# Patient Record
Sex: Female | Born: 1940 | Race: White | Hispanic: No | Smoking: Never smoker
Health system: Southern US, Community
[De-identification: ages and names within clinical notes are randomized; demographics above are authoritative.]

## PROBLEM LIST (undated history)

## (undated) DIAGNOSIS — N2581 Secondary hyperparathyroidism of renal origin: Secondary | ICD-10-CM

## (undated) DIAGNOSIS — R911 Solitary pulmonary nodule: Secondary | ICD-10-CM

## (undated) DIAGNOSIS — K5730 Diverticulosis of large intestine without perforation or abscess without bleeding: Secondary | ICD-10-CM

## (undated) DIAGNOSIS — I639 Cerebral infarction, unspecified: Secondary | ICD-10-CM

## (undated) DIAGNOSIS — Z87448 Personal history of other diseases of urinary system: Secondary | ICD-10-CM

## (undated) DIAGNOSIS — R002 Palpitations: Secondary | ICD-10-CM

## (undated) DIAGNOSIS — M792 Neuralgia and neuritis, unspecified: Secondary | ICD-10-CM

## (undated) DIAGNOSIS — Z90710 Acquired absence of both cervix and uterus: Secondary | ICD-10-CM

## (undated) DIAGNOSIS — Z950 Presence of cardiac pacemaker: Secondary | ICD-10-CM

## (undated) DIAGNOSIS — M810 Age-related osteoporosis without current pathological fracture: Secondary | ICD-10-CM

## (undated) DIAGNOSIS — M5481 Occipital neuralgia: Secondary | ICD-10-CM

## (undated) DIAGNOSIS — E7800 Pure hypercholesterolemia, unspecified: Secondary | ICD-10-CM

## (undated) DIAGNOSIS — N184 Chronic kidney disease, stage 4 (severe): Secondary | ICD-10-CM

## (undated) DIAGNOSIS — M1990 Unspecified osteoarthritis, unspecified site: Secondary | ICD-10-CM

## (undated) DIAGNOSIS — E039 Hypothyroidism, unspecified: Secondary | ICD-10-CM

## (undated) DIAGNOSIS — D649 Anemia, unspecified: Secondary | ICD-10-CM

## (undated) DIAGNOSIS — K635 Polyp of colon: Secondary | ICD-10-CM

## (undated) DIAGNOSIS — I10 Essential (primary) hypertension: Secondary | ICD-10-CM

## (undated) DIAGNOSIS — M47812 Spondylosis without myelopathy or radiculopathy, cervical region: Secondary | ICD-10-CM

## (undated) DIAGNOSIS — I452 Bifascicular block: Secondary | ICD-10-CM

## (undated) DIAGNOSIS — R0989 Other specified symptoms and signs involving the circulatory and respiratory systems: Secondary | ICD-10-CM

## (undated) HISTORY — DX: Personal history of other diseases of urinary system: Z87.448

## (undated) HISTORY — DX: Essential (primary) hypertension: I10

## (undated) HISTORY — DX: Spondylosis without myelopathy or radiculopathy, cervical region: M47.812

## (undated) HISTORY — DX: Pure hypercholesterolemia, unspecified: E78.00

## (undated) HISTORY — DX: Age-related osteoporosis without current pathological fracture: M81.0

## (undated) HISTORY — DX: Hypothyroidism, unspecified: E03.9

## (undated) HISTORY — DX: Acquired absence of both cervix and uterus: Z90.710

## (undated) HISTORY — DX: Solitary pulmonary nodule: R91.1

## (undated) HISTORY — DX: Cerebral infarction, unspecified: I63.9

## (undated) HISTORY — DX: Diverticulosis of large intestine without perforation or abscess without bleeding: K57.30

## (undated) HISTORY — PX: BACK SURGERY: SHX140

## (undated) HISTORY — DX: Chronic kidney disease, stage 4 (severe): N18.4

## (undated) HISTORY — PX: ABDOMINAL HYSTERECTOMY: SHX81

## (undated) HISTORY — DX: Unspecified osteoarthritis, unspecified site: M19.90

## (undated) HISTORY — DX: Anemia, unspecified: D64.9

## (undated) HISTORY — DX: Secondary hyperparathyroidism of renal origin: N25.81

## (undated) HISTORY — DX: Polyp of colon: K63.5

## (undated) HISTORY — DX: Neuralgia and neuritis, unspecified: M79.2

## (undated) HISTORY — DX: Palpitations: R00.2

## (undated) HISTORY — DX: Occipital neuralgia: M54.81

## (undated) HISTORY — DX: Other specified symptoms and signs involving the circulatory and respiratory systems: R09.89

---

## 2000-02-11 ENCOUNTER — Ambulatory Visit (HOSPITAL_COMMUNITY): Admission: RE | Admit: 2000-02-11 | Discharge: 2000-02-11 | Payer: Self-pay | Admitting: Cardiology

## 2000-08-10 ENCOUNTER — Other Ambulatory Visit: Admission: RE | Admit: 2000-08-10 | Discharge: 2000-08-10 | Payer: Self-pay | Admitting: Gynecology

## 2001-10-06 ENCOUNTER — Encounter: Payer: Self-pay | Admitting: Endocrinology

## 2001-10-06 ENCOUNTER — Encounter: Admission: RE | Admit: 2001-10-06 | Discharge: 2001-10-06 | Payer: Self-pay | Admitting: Endocrinology

## 2001-11-14 ENCOUNTER — Encounter: Payer: Self-pay | Admitting: Neurology

## 2001-11-14 ENCOUNTER — Ambulatory Visit (HOSPITAL_COMMUNITY): Admission: RE | Admit: 2001-11-14 | Discharge: 2001-11-14 | Payer: Self-pay | Admitting: Neurology

## 2001-11-29 ENCOUNTER — Encounter: Payer: Self-pay | Admitting: Neurology

## 2001-11-29 ENCOUNTER — Encounter: Admission: RE | Admit: 2001-11-29 | Discharge: 2001-11-29 | Payer: Self-pay | Admitting: Neurology

## 2001-12-07 ENCOUNTER — Ambulatory Visit (HOSPITAL_COMMUNITY): Admission: RE | Admit: 2001-12-07 | Discharge: 2001-12-07 | Payer: Self-pay | Admitting: Neurosurgery

## 2001-12-07 ENCOUNTER — Encounter: Payer: Self-pay | Admitting: Neurosurgery

## 2001-12-30 ENCOUNTER — Encounter: Payer: Self-pay | Admitting: Neurosurgery

## 2002-01-04 ENCOUNTER — Ambulatory Visit (HOSPITAL_COMMUNITY): Admission: RE | Admit: 2002-01-04 | Discharge: 2002-01-05 | Payer: Self-pay | Admitting: Neurosurgery

## 2002-01-04 ENCOUNTER — Encounter: Payer: Self-pay | Admitting: Neurosurgery

## 2002-08-16 ENCOUNTER — Ambulatory Visit (HOSPITAL_COMMUNITY): Admission: RE | Admit: 2002-08-16 | Discharge: 2002-08-16 | Payer: Self-pay | Admitting: Neurosurgery

## 2002-08-16 ENCOUNTER — Encounter: Payer: Self-pay | Admitting: Neurosurgery

## 2002-11-14 ENCOUNTER — Encounter: Admission: RE | Admit: 2002-11-14 | Discharge: 2002-11-14 | Payer: Self-pay | Admitting: Neurosurgery

## 2002-11-14 ENCOUNTER — Encounter: Payer: Self-pay | Admitting: Diagnostic Radiology

## 2002-11-14 ENCOUNTER — Encounter: Payer: Self-pay | Admitting: Neurosurgery

## 2002-11-28 ENCOUNTER — Encounter: Payer: Self-pay | Admitting: Neurosurgery

## 2002-11-28 ENCOUNTER — Encounter: Admission: RE | Admit: 2002-11-28 | Discharge: 2002-11-28 | Payer: Self-pay | Admitting: Neurosurgery

## 2003-02-16 ENCOUNTER — Encounter
Admission: RE | Admit: 2003-02-16 | Discharge: 2003-05-17 | Payer: Self-pay | Admitting: Physical Medicine & Rehabilitation

## 2003-02-23 ENCOUNTER — Ambulatory Visit (HOSPITAL_COMMUNITY)
Admission: RE | Admit: 2003-02-23 | Discharge: 2003-02-23 | Payer: Self-pay | Admitting: Physical Medicine & Rehabilitation

## 2003-02-23 ENCOUNTER — Encounter: Payer: Self-pay | Admitting: Physical Medicine & Rehabilitation

## 2003-08-02 ENCOUNTER — Ambulatory Visit (HOSPITAL_COMMUNITY): Admission: RE | Admit: 2003-08-02 | Discharge: 2003-08-02 | Payer: Self-pay | Admitting: Neurosurgery

## 2003-09-18 ENCOUNTER — Other Ambulatory Visit: Admission: RE | Admit: 2003-09-18 | Discharge: 2003-09-18 | Payer: Self-pay | Admitting: Gynecology

## 2004-01-20 ENCOUNTER — Emergency Department (HOSPITAL_COMMUNITY): Admission: EM | Admit: 2004-01-20 | Discharge: 2004-01-20 | Payer: Self-pay | Admitting: Emergency Medicine

## 2004-02-13 ENCOUNTER — Ambulatory Visit (HOSPITAL_COMMUNITY): Admission: RE | Admit: 2004-02-13 | Discharge: 2004-02-13 | Payer: Self-pay | Admitting: Neurology

## 2004-03-01 ENCOUNTER — Ambulatory Visit (HOSPITAL_COMMUNITY): Admission: RE | Admit: 2004-03-01 | Discharge: 2004-03-01 | Payer: Self-pay | Admitting: Neurology

## 2004-03-04 ENCOUNTER — Ambulatory Visit (HOSPITAL_COMMUNITY): Admission: RE | Admit: 2004-03-04 | Discharge: 2004-03-04 | Payer: Self-pay | Admitting: Cardiology

## 2004-03-04 ENCOUNTER — Encounter: Payer: Self-pay | Admitting: Internal Medicine

## 2004-06-10 ENCOUNTER — Ambulatory Visit: Payer: Self-pay | Admitting: Endocrinology

## 2004-07-08 ENCOUNTER — Ambulatory Visit: Payer: Self-pay | Admitting: Endocrinology

## 2004-09-09 ENCOUNTER — Emergency Department (HOSPITAL_COMMUNITY): Admission: EM | Admit: 2004-09-09 | Discharge: 2004-09-09 | Payer: Self-pay | Admitting: Emergency Medicine

## 2004-09-09 ENCOUNTER — Ambulatory Visit: Payer: Self-pay | Admitting: Gastroenterology

## 2004-10-02 ENCOUNTER — Ambulatory Visit: Payer: Self-pay | Admitting: Endocrinology

## 2004-10-04 ENCOUNTER — Ambulatory Visit: Payer: Self-pay | Admitting: Cardiology

## 2004-10-09 ENCOUNTER — Ambulatory Visit: Payer: Self-pay | Admitting: Cardiology

## 2004-10-09 ENCOUNTER — Ambulatory Visit: Payer: Self-pay | Admitting: Endocrinology

## 2004-10-09 ENCOUNTER — Ambulatory Visit: Payer: Self-pay

## 2004-10-18 ENCOUNTER — Ambulatory Visit: Payer: Self-pay | Admitting: Cardiology

## 2004-11-07 ENCOUNTER — Ambulatory Visit: Payer: Self-pay | Admitting: Internal Medicine

## 2004-12-08 ENCOUNTER — Inpatient Hospital Stay (HOSPITAL_COMMUNITY): Admission: EM | Admit: 2004-12-08 | Discharge: 2004-12-10 | Payer: Self-pay | Admitting: Emergency Medicine

## 2004-12-09 ENCOUNTER — Ambulatory Visit: Payer: Self-pay | Admitting: Cardiology

## 2004-12-09 ENCOUNTER — Encounter: Payer: Self-pay | Admitting: Cardiology

## 2004-12-10 ENCOUNTER — Ambulatory Visit: Payer: Self-pay | Admitting: Endocrinology

## 2005-01-15 ENCOUNTER — Ambulatory Visit: Payer: Self-pay | Admitting: Cardiology

## 2005-02-05 ENCOUNTER — Ambulatory Visit: Payer: Self-pay | Admitting: Endocrinology

## 2005-04-03 ENCOUNTER — Ambulatory Visit: Payer: Self-pay | Admitting: Cardiology

## 2005-05-15 ENCOUNTER — Ambulatory Visit: Payer: Self-pay | Admitting: Cardiology

## 2005-05-15 ENCOUNTER — Inpatient Hospital Stay (HOSPITAL_COMMUNITY): Admission: AD | Admit: 2005-05-15 | Discharge: 2005-05-16 | Payer: Self-pay | Admitting: Cardiology

## 2005-05-29 ENCOUNTER — Ambulatory Visit: Payer: Self-pay | Admitting: Cardiology

## 2005-06-11 ENCOUNTER — Encounter: Admission: RE | Admit: 2005-06-11 | Discharge: 2005-06-11 | Payer: Self-pay | Admitting: Neurology

## 2005-06-20 ENCOUNTER — Ambulatory Visit: Payer: Self-pay | Admitting: Internal Medicine

## 2005-06-25 ENCOUNTER — Ambulatory Visit: Payer: Self-pay | Admitting: Cardiology

## 2005-08-13 ENCOUNTER — Ambulatory Visit: Payer: Self-pay | Admitting: Cardiology

## 2005-08-20 ENCOUNTER — Ambulatory Visit: Payer: Self-pay | Admitting: Gastroenterology

## 2005-09-17 ENCOUNTER — Ambulatory Visit: Payer: Self-pay | Admitting: Cardiology

## 2005-09-25 ENCOUNTER — Ambulatory Visit: Payer: Self-pay | Admitting: Gastroenterology

## 2005-10-03 ENCOUNTER — Ambulatory Visit: Payer: Self-pay | Admitting: Gastroenterology

## 2005-10-09 ENCOUNTER — Ambulatory Visit: Payer: Self-pay | Admitting: Endocrinology

## 2005-10-16 ENCOUNTER — Ambulatory Visit: Payer: Self-pay | Admitting: Endocrinology

## 2006-03-23 ENCOUNTER — Emergency Department (HOSPITAL_COMMUNITY): Admission: EM | Admit: 2006-03-23 | Discharge: 2006-03-24 | Payer: Self-pay | Admitting: Emergency Medicine

## 2006-04-13 ENCOUNTER — Ambulatory Visit: Payer: Self-pay | Admitting: Cardiology

## 2006-06-05 ENCOUNTER — Ambulatory Visit: Payer: Self-pay | Admitting: Internal Medicine

## 2006-10-09 ENCOUNTER — Ambulatory Visit: Payer: Self-pay | Admitting: Endocrinology

## 2006-10-09 LAB — CONVERTED CEMR LAB
ALT: 34 units/L (ref 0–40)
Albumin: 3.4 g/dL — ABNORMAL LOW (ref 3.5–5.2)
Alkaline Phosphatase: 68 units/L (ref 39–117)
BUN: 21 mg/dL (ref 6–23)
Bacteria, UA: NEGATIVE
Basophils Relative: 1 % (ref 0.0–1.0)
CO2: 30 meq/L (ref 19–32)
Calcium: 9.1 mg/dL (ref 8.4–10.5)
Creatinine, Ser: 1.1 mg/dL (ref 0.4–1.2)
Crystals: NEGATIVE
Eosinophils Relative: 6.4 % — ABNORMAL HIGH (ref 0.0–5.0)
GFR calc Af Amer: 64 mL/min
HDL: 93 mg/dL (ref >39.0)
Lymphocytes Relative: 29.4 % (ref 12.0–46.0)
Mucus, UA: NEGATIVE
Platelets: 211 10*3/uL (ref 150–400)
RBC / HPF: NONE SEEN
RBC: 4.14 M/uL (ref 3.87–5.11)
RDW: 13.4 % (ref 11.5–14.6)
Specific Gravity, Urine: 1.025 (ref 1.000–1.03)
TSH: 4.75 microintl units/mL (ref 0.35–5.50)
Total Bilirubin: 0.7 mg/dL (ref 0.3–1.2)
Total CHOL/HDL Ratio: 2.5
Total Protein: 6.2 g/dL (ref 6.0–8.3)
Triglycerides: 48 mg/dL (ref 0–149)
VLDL: 10 mg/dL (ref 0–40)
WBC: 5 10*3/uL (ref 4.5–10.5)

## 2006-10-19 ENCOUNTER — Ambulatory Visit: Payer: Self-pay | Admitting: Endocrinology

## 2006-10-22 ENCOUNTER — Ambulatory Visit: Payer: Self-pay | Admitting: Cardiology

## 2007-02-06 ENCOUNTER — Encounter: Payer: Self-pay | Admitting: *Deleted

## 2007-02-06 DIAGNOSIS — E039 Hypothyroidism, unspecified: Secondary | ICD-10-CM | POA: Insufficient documentation

## 2007-02-06 DIAGNOSIS — M479 Spondylosis, unspecified: Secondary | ICD-10-CM | POA: Insufficient documentation

## 2007-02-06 DIAGNOSIS — Z87448 Personal history of other diseases of urinary system: Secondary | ICD-10-CM | POA: Insufficient documentation

## 2007-02-06 DIAGNOSIS — K219 Gastro-esophageal reflux disease without esophagitis: Secondary | ICD-10-CM | POA: Insufficient documentation

## 2007-02-06 DIAGNOSIS — Z860100 Personal history of colon polyps, unspecified: Secondary | ICD-10-CM | POA: Insufficient documentation

## 2007-02-06 DIAGNOSIS — I10 Essential (primary) hypertension: Secondary | ICD-10-CM | POA: Insufficient documentation

## 2007-02-06 DIAGNOSIS — M810 Age-related osteoporosis without current pathological fracture: Secondary | ICD-10-CM | POA: Insufficient documentation

## 2007-02-06 DIAGNOSIS — R002 Palpitations: Secondary | ICD-10-CM | POA: Insufficient documentation

## 2007-02-06 DIAGNOSIS — K5730 Diverticulosis of large intestine without perforation or abscess without bleeding: Secondary | ICD-10-CM | POA: Insufficient documentation

## 2007-04-12 ENCOUNTER — Ambulatory Visit: Payer: Self-pay | Admitting: Cardiology

## 2007-04-26 ENCOUNTER — Telehealth (INDEPENDENT_AMBULATORY_CARE_PROVIDER_SITE_OTHER): Payer: Self-pay | Admitting: *Deleted

## 2007-04-27 ENCOUNTER — Ambulatory Visit: Payer: Self-pay | Admitting: Endocrinology

## 2007-04-27 DIAGNOSIS — J984 Other disorders of lung: Secondary | ICD-10-CM | POA: Insufficient documentation

## 2007-05-17 ENCOUNTER — Ambulatory Visit: Payer: Self-pay | Admitting: Cardiology

## 2007-10-25 ENCOUNTER — Ambulatory Visit: Payer: Self-pay | Admitting: Endocrinology

## 2007-10-26 LAB — CONVERTED CEMR LAB
ALT: 25 units/L (ref 0–35)
AST: 32 units/L (ref 0–37)
Basophils Absolute: 0 10*3/uL (ref 0.0–0.1)
Bilirubin, Direct: 0.1 mg/dL (ref 0.0–0.3)
CO2: 29 meq/L (ref 19–32)
Chloride: 107 meq/L (ref 96–112)
Cholesterol: 250 mg/dL (ref 0–200)
Creatinine, Ser: 1 mg/dL (ref 0.4–1.2)
Direct LDL: 142.1 mg/dL
GFR calc non Af Amer: 59 mL/min
HDL: 90.6 mg/dL (ref >39.0)
Hemoglobin: 13.2 g/dL (ref 12.0–15.0)
Lymphocytes Relative: 25.8 % (ref 12.0–46.0)
MCHC: 33.8 g/dL (ref 30.0–36.0)
Monocytes Relative: 9.3 % (ref 3.0–12.0)
Mucus, UA: NEGATIVE
Neutro Abs: 3.8 10*3/uL (ref 1.4–7.7)
Neutrophils Relative %: 59.6 % (ref 43.0–77.0)
Nitrite: NEGATIVE
Platelets: 231 10*3/uL (ref 150–400)
RDW: 13 % (ref 11.5–14.6)
Sodium: 142 meq/L (ref 135–145)
Specific Gravity, Urine: 1.02 (ref 1.000–1.03)
TSH: 2.66 microintl units/mL (ref 0.35–5.50)
Total Bilirubin: 0.9 mg/dL (ref 0.3–1.2)
Total CHOL/HDL Ratio: 2.8
Total Protein, Urine: NEGATIVE mg/dL
VLDL: 13 mg/dL (ref 0–40)
pH: 6 (ref 5.0–8.0)

## 2007-10-27 ENCOUNTER — Ambulatory Visit: Payer: Self-pay | Admitting: Internal Medicine

## 2007-11-03 ENCOUNTER — Telehealth: Payer: Self-pay | Admitting: Endocrinology

## 2007-11-08 ENCOUNTER — Ambulatory Visit: Payer: Self-pay | Admitting: Cardiology

## 2007-11-18 ENCOUNTER — Ambulatory Visit: Payer: Self-pay | Admitting: Cardiology

## 2007-11-24 ENCOUNTER — Encounter: Admission: RE | Admit: 2007-11-24 | Discharge: 2007-11-24 | Payer: Self-pay | Admitting: Neurology

## 2008-05-08 ENCOUNTER — Telehealth (INDEPENDENT_AMBULATORY_CARE_PROVIDER_SITE_OTHER): Payer: Self-pay | Admitting: *Deleted

## 2008-05-29 ENCOUNTER — Ambulatory Visit: Payer: Self-pay | Admitting: Endocrinology

## 2008-06-02 ENCOUNTER — Telehealth: Payer: Self-pay | Admitting: Endocrinology

## 2008-06-26 ENCOUNTER — Ambulatory Visit: Payer: Self-pay | Admitting: Cardiology

## 2008-07-07 ENCOUNTER — Telehealth (INDEPENDENT_AMBULATORY_CARE_PROVIDER_SITE_OTHER): Payer: Self-pay | Admitting: *Deleted

## 2008-07-24 ENCOUNTER — Telehealth (INDEPENDENT_AMBULATORY_CARE_PROVIDER_SITE_OTHER): Payer: Self-pay | Admitting: *Deleted

## 2008-08-17 ENCOUNTER — Ambulatory Visit: Payer: Self-pay | Admitting: Cardiology

## 2008-08-17 ENCOUNTER — Encounter: Payer: Self-pay | Admitting: Cardiology

## 2008-08-17 DIAGNOSIS — Z8679 Personal history of other diseases of the circulatory system: Secondary | ICD-10-CM | POA: Insufficient documentation

## 2008-08-17 DIAGNOSIS — I6350 Cerebral infarction due to unspecified occlusion or stenosis of unspecified cerebral artery: Secondary | ICD-10-CM | POA: Insufficient documentation

## 2008-08-17 DIAGNOSIS — E7800 Pure hypercholesterolemia, unspecified: Secondary | ICD-10-CM | POA: Insufficient documentation

## 2008-09-25 ENCOUNTER — Ambulatory Visit: Payer: Self-pay | Admitting: Gastroenterology

## 2008-09-26 ENCOUNTER — Telehealth: Payer: Self-pay | Admitting: Cardiology

## 2008-10-23 ENCOUNTER — Ambulatory Visit: Payer: Self-pay | Admitting: Gastroenterology

## 2008-10-24 ENCOUNTER — Ambulatory Visit: Payer: Self-pay | Admitting: Endocrinology

## 2008-10-26 ENCOUNTER — Encounter: Payer: Self-pay | Admitting: Endocrinology

## 2008-12-04 ENCOUNTER — Ambulatory Visit: Payer: Self-pay | Admitting: Endocrinology

## 2008-12-04 DIAGNOSIS — K224 Dyskinesia of esophagus: Secondary | ICD-10-CM | POA: Insufficient documentation

## 2008-12-07 LAB — CONVERTED CEMR LAB: PTH: 59.9 pg/mL (ref 14.0–72.0)

## 2008-12-25 ENCOUNTER — Ambulatory Visit: Payer: Self-pay | Admitting: Endocrinology

## 2008-12-26 LAB — CONVERTED CEMR LAB
AST: 33 units/L (ref 0–37)
Albumin: 3.7 g/dL (ref 3.5–5.2)
Alkaline Phosphatase: 86 units/L (ref 39–117)
BUN: 27 mg/dL — ABNORMAL HIGH (ref 6–23)
Basophils Absolute: 0 10*3/uL (ref 0.0–0.1)
Bilirubin, Direct: 0 mg/dL (ref 0.0–0.3)
Chloride: 104 meq/L (ref 96–112)
Cholesterol: 248 mg/dL — ABNORMAL HIGH (ref 0–200)
Direct LDL: 138.7 mg/dL
Eosinophils Absolute: 0.4 10*3/uL (ref 0.0–0.7)
GFR calc non Af Amer: 36.78 mL/min (ref >60)
HDL: 88.9 mg/dL (ref >39.00)
Hemoglobin: 13.6 g/dL (ref 12.0–15.0)
Ketones, ur: NEGATIVE mg/dL
Lymphocytes Relative: 23.8 % (ref 12.0–46.0)
MCHC: 34 g/dL (ref 30.0–36.0)
Monocytes Relative: 10.2 % (ref 3.0–12.0)
Neutro Abs: 3.7 10*3/uL (ref 1.4–7.7)
Neutrophils Relative %: 58.9 % (ref 43.0–77.0)
Platelets: 230 10*3/uL (ref 150.0–400.0)
Potassium: 3.9 meq/L (ref 3.5–5.1)
RDW: 12.8 % (ref 11.5–14.6)
Sodium: 141 meq/L (ref 135–145)
Specific Gravity, Urine: >=1.03 (ref 1.000–1.030)
Total Bilirubin: 0.8 mg/dL (ref 0.3–1.2)
Total CHOL/HDL Ratio: 3
Total Protein, Urine: NEGATIVE mg/dL
Urine Glucose: NEGATIVE mg/dL
Urobilinogen, UA: 0.2 (ref 0.0–1.0)
VLDL: 8.8 mg/dL (ref 0.0–40.0)
pH: 5.5 (ref 5.0–8.0)

## 2009-01-01 ENCOUNTER — Ambulatory Visit: Payer: Self-pay | Admitting: Cardiology

## 2009-02-19 ENCOUNTER — Ambulatory Visit: Payer: Self-pay | Admitting: Cardiovascular Disease

## 2009-02-19 ENCOUNTER — Ambulatory Visit: Payer: Self-pay | Admitting: Endocrinology

## 2009-02-19 ENCOUNTER — Ambulatory Visit: Payer: Self-pay | Admitting: Internal Medicine

## 2009-02-19 DIAGNOSIS — L03211 Cellulitis of face: Secondary | ICD-10-CM | POA: Insufficient documentation

## 2009-02-19 DIAGNOSIS — L0201 Cutaneous abscess of face: Secondary | ICD-10-CM | POA: Insufficient documentation

## 2009-02-21 ENCOUNTER — Telehealth: Payer: Self-pay | Admitting: Endocrinology

## 2009-02-23 ENCOUNTER — Telehealth: Payer: Self-pay | Admitting: Cardiology

## 2009-03-05 ENCOUNTER — Telehealth: Payer: Self-pay | Admitting: Endocrinology

## 2009-03-05 ENCOUNTER — Telehealth: Payer: Self-pay | Admitting: Cardiology

## 2009-03-16 ENCOUNTER — Telehealth: Payer: Self-pay | Admitting: Endocrinology

## 2009-07-09 ENCOUNTER — Encounter: Payer: Self-pay | Admitting: Cardiology

## 2009-07-09 ENCOUNTER — Ambulatory Visit: Payer: Self-pay | Admitting: Cardiology

## 2009-07-09 ENCOUNTER — Telehealth: Payer: Self-pay | Admitting: Cardiology

## 2009-07-09 ENCOUNTER — Observation Stay (HOSPITAL_COMMUNITY): Admission: EM | Admit: 2009-07-09 | Discharge: 2009-07-10 | Payer: Self-pay | Admitting: Emergency Medicine

## 2009-07-16 ENCOUNTER — Encounter: Payer: Self-pay | Admitting: Cardiology

## 2009-07-16 ENCOUNTER — Telehealth (INDEPENDENT_AMBULATORY_CARE_PROVIDER_SITE_OTHER): Payer: Self-pay | Admitting: *Deleted

## 2009-07-17 ENCOUNTER — Encounter: Payer: Self-pay | Admitting: Cardiology

## 2009-07-17 ENCOUNTER — Ambulatory Visit: Payer: Self-pay

## 2009-07-17 ENCOUNTER — Encounter (HOSPITAL_COMMUNITY): Admission: RE | Admit: 2009-07-17 | Discharge: 2009-09-05 | Payer: Self-pay | Admitting: Cardiology

## 2009-07-17 ENCOUNTER — Ambulatory Visit: Payer: Self-pay | Admitting: Cardiology

## 2009-07-26 ENCOUNTER — Ambulatory Visit: Payer: Self-pay | Admitting: Cardiology

## 2009-07-26 DIAGNOSIS — R079 Chest pain, unspecified: Secondary | ICD-10-CM | POA: Insufficient documentation

## 2009-09-24 ENCOUNTER — Telehealth: Payer: Self-pay | Admitting: Cardiology

## 2009-10-03 LAB — CONVERTED CEMR LAB: Pap Smear: NORMAL

## 2009-10-29 ENCOUNTER — Encounter: Payer: Self-pay | Admitting: Endocrinology

## 2009-10-31 ENCOUNTER — Telehealth: Payer: Self-pay | Admitting: Endocrinology

## 2009-12-04 ENCOUNTER — Ambulatory Visit: Payer: Self-pay | Admitting: Endocrinology

## 2009-12-04 LAB — CONVERTED CEMR LAB
ALT: 26 units/L (ref 0–35)
AST: 32 units/L (ref 0–37)
Albumin: 4.1 g/dL (ref 3.5–5.2)
BUN: 22 mg/dL (ref 6–23)
Bilirubin Urine: NEGATIVE
Calcium, Total (PTH): 9.7 mg/dL (ref 8.4–10.5)
Chloride: 108 meq/L (ref 96–112)
Eosinophils Relative: 9.6 % — ABNORMAL HIGH (ref 0.0–5.0)
GFR calc non Af Amer: 42.5 mL/min (ref >60)
Glucose, Bld: 85 mg/dL (ref 70–99)
HCT: 39.9 % (ref 36.0–46.0)
HDL: 96.2 mg/dL (ref >39.00)
Hemoglobin, Urine: NEGATIVE
Hemoglobin: 13.8 g/dL (ref 12.0–15.0)
Ketones, ur: NEGATIVE mg/dL
Lymphs Abs: 1.8 10*3/uL (ref 0.7–4.0)
MCV: 90.3 fL (ref 78.0–100.0)
Monocytes Absolute: 0.5 10*3/uL (ref 0.1–1.0)
Monocytes Relative: 8.6 % (ref 3.0–12.0)
Neutro Abs: 3.3 10*3/uL (ref 1.4–7.7)
Potassium: 4.7 meq/L (ref 3.5–5.1)
Sodium: 144 meq/L (ref 135–145)
TSH: 4.47 microintl units/mL (ref 0.35–5.50)
Total Protein: 7.3 g/dL (ref 6.0–8.3)
Urobilinogen, UA: 0.2 (ref 0.0–1.0)
WBC: 6.2 10*3/uL (ref 4.5–10.5)

## 2010-01-14 ENCOUNTER — Encounter: Payer: Self-pay | Admitting: Cardiology

## 2010-03-06 ENCOUNTER — Telehealth: Payer: Self-pay | Admitting: Endocrinology

## 2010-03-18 ENCOUNTER — Ambulatory Visit: Payer: Self-pay | Admitting: Endocrinology

## 2010-03-18 DIAGNOSIS — R059 Cough, unspecified: Secondary | ICD-10-CM | POA: Insufficient documentation

## 2010-03-18 DIAGNOSIS — R05 Cough: Secondary | ICD-10-CM

## 2010-03-22 ENCOUNTER — Encounter: Payer: Self-pay | Admitting: Endocrinology

## 2010-03-30 ENCOUNTER — Encounter: Payer: Self-pay | Admitting: Endocrinology

## 2010-04-04 ENCOUNTER — Encounter
Admission: RE | Admit: 2010-04-04 | Discharge: 2010-04-04 | Payer: Self-pay | Source: Home / Self Care | Admitting: Neurology

## 2010-05-16 ENCOUNTER — Other Ambulatory Visit: Payer: Self-pay | Admitting: Endocrinology

## 2010-05-16 ENCOUNTER — Ambulatory Visit
Admission: RE | Admit: 2010-05-16 | Discharge: 2010-05-16 | Payer: Self-pay | Source: Home / Self Care | Attending: Endocrinology | Admitting: Endocrinology

## 2010-05-16 DIAGNOSIS — R358 Other polyuria: Secondary | ICD-10-CM | POA: Insufficient documentation

## 2010-05-16 DIAGNOSIS — R1310 Dysphagia, unspecified: Secondary | ICD-10-CM | POA: Insufficient documentation

## 2010-05-16 DIAGNOSIS — R3589 Other polyuria: Secondary | ICD-10-CM | POA: Insufficient documentation

## 2010-05-16 LAB — BASIC METABOLIC PANEL WITH GFR
BUN: 31 mg/dL — ABNORMAL HIGH (ref 6–23)
CO2: 25 meq/L (ref 19–32)
Calcium: 8.5 mg/dL (ref 8.4–10.5)
Chloride: 107 meq/L (ref 96–112)
Creatinine, Ser: 1.4 mg/dL — ABNORMAL HIGH (ref 0.4–1.2)
GFR: 39.99 mL/min — ABNORMAL LOW (ref >60.00)
Glucose, Bld: 85 mg/dL (ref 70–99)
Potassium: 4.2 meq/L (ref 3.5–5.1)
Sodium: 140 meq/L (ref 135–145)

## 2010-05-16 LAB — TSH: TSH: 5.56 u[IU]/mL — ABNORMAL HIGH (ref 0.35–5.50)

## 2010-05-26 ENCOUNTER — Encounter: Payer: Self-pay | Admitting: Endocrinology

## 2010-05-30 ENCOUNTER — Ambulatory Visit (HOSPITAL_COMMUNITY)
Admission: RE | Admit: 2010-05-30 | Discharge: 2010-05-30 | Payer: Self-pay | Source: Home / Self Care | Attending: Endocrinology | Admitting: Endocrinology

## 2010-06-04 NOTE — Progress Notes (Signed)
Summary: Nuclear Pre-Procedure  Phone Note Outgoing Call   Call placed by: Perrin Maltese, EMT-P,  July 16, 2009 1:04 PM Summary of Call: Left message with information on Myoview Information Sheet (see scanned document for details).      Nuclear Med Background Indications for Stress Test: Evaluation for Ischemia, Post Hospital  Indications Comments: 07/10/09 CP (-) enzymes recent URI  History: COPD, Echo, Myocardial Perfusion Study  History Comments: '06 ECHO 60% '07 MPS EF 65% (-) scar or ischemia COPD per CXR  Symptoms: Chest Pain, Chest Pain with Exertion, DOE    Nuclear Pre-Procedure Cardiac Risk Factors: CVA, Family History - CAD, Hypertension, Lipids, TIA Height (in): 63.5  Nuclear Med Study Referring MD:  T.Stuckey

## 2010-06-04 NOTE — Assessment & Plan Note (Signed)
Summary: YEARLY FU/ MEDICARE/ LABS AFTER/NWS   Vital Signs:  Patient profile:   70 year old female Height:      63.5 inches (161.29 cm) Weight:      133.31 pounds (60.60 kg) BMI:     23.33 O2 Sat:      99 % on Room air Temp:     97.8 degrees F (36.56 degrees C) oral Pulse rate:   61 / minute BP sitting:   132 / 86  (left arm) Cuff size:   large  Vitals Entered By: Rebeca Alert MA (December 04, 2009 8:21 AM)  O2 Flow:  Room air CC: Yearly F/U-medicare/aj Comments Pt had Zostavax in 2009  Vision Screening:Left eye with correction: 20 / 20 Right eye with correction: 20 / 20 Both eyes with correction: 20 / 15        Vision Entered By: Rebeca Alert MA (December 04, 2009 8:22 AM)   Primary Provider:  Renato Shin, MD  CC:  Yearly F/U-medicare/aj.  History of Present Illness: here for regular wellness examination.  she's feeling pretty well in general, and does not drink or smoke.   Current Medications (verified): 1)  Metoprolol Succinate 50 Mg Xr24h-Tab (Metoprolol Succinate) .... Take One Tablet By Mouth Daily 2)  Adult Aspirin Low Strength 81 Mg  Tbdp (Aspirin) .... Take 1 By Mouth Once Daily 3)  Plavix 75 Mg  Tabs (Clopidogrel Bisulfate) .... Take 1 By Mouth Qd 4)  Fish Oil   Oil (Fish Oil) .... 1200mg  2 Tabs Once Daily 5)  Neurontin 300 Mg  Caps (Gabapentin) .... Take 1 By Mouth Two Times A Day Qd 6)  Synthroid 75 Mcg Tabs (Levothyroxine Sodium) .... Take 1 Tablet By Mouth Once A Day 7)  Calcitriol 0.25 Mcg Caps (Calcitriol) .... Qd 8)  Multivitamins  Tabs (Multiple Vitamin) .... Take 1 Tablet By Mouth Once A Day 9)  Vitamin D 1000 Unit  Tabs (Cholecalciferol) .... Take 1 Tablet By Mouth Once A Day 10)  Amlodipine Besylate 10 Mg Tabs (Amlodipine Besylate) .... Take One Tablet By Mouth Daily  Allergies (verified): 1)  ! Codeine 2)  ! Septra 3)  ! Mevacor 4)  ! Iodine 5)  ! Sulfa 6)  ! * Triamterene/hctz 7)  ! * Ivp Dye 8)  ! Flagyl  Family History: Reviewed  history from 09/25/2008 and no changes required. Father had kidney cancer and MI Family History of Colon Cancer: Mother Family History of Ovarian Cancer: Mother Family History of Heart Disease: Mother, Father  Social History: Reviewed history from 09/25/2008 and no changes required. 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  Alcohol Use - no Daily Caffeine Use 1 cup tea daily Illicit Drug Use - no  Review of Systems  The patient denies fever, weight gain, vision loss, decreased hearing, syncope, dyspnea on exertion, prolonged cough, headaches, abdominal pain, melena, hematochezia, severe indigestion/heartburn, hematuria, suspicious skin lesions, and depression.    Physical Exam  General:  normal appearance.   Head:  head: no deformity eyes: no periorbital swelling, no proptosis external nose and ears are normal mouth: no lesion seen Neck:  Supple without thyroid enlargement or tenderness.  Breasts:  sees gyn  Lungs:  Clear to auscultation bilaterally. Normal respiratory effort.  Heart:  Regular rate and rhythm without murmurs or gallops noted. Normal S1,S2.   Abdomen:  abdomen is soft, nontender.  no hepatosplenomegaly.   not distended.  no hernia  Rectal:  sees gyn  Genitalia:  sees gyn  Msk:  muscle bulk and strength are grossly norma, except the left foot is slightly decreasedl.  no obvious joint swelling.  gait is normal and steady  Neurologic:  cn 2-12 grossly intact.   readily moves all 4's.   sensation is intact to touch on the feet  Skin:  normal texture and temp.  no rash.  not diaphoretic  Cervical Nodes:  No significant adenopathy.  Psych:  Alert and cooperative; normal mood and affect; normal attention span and concentration.   Additional Exam:  SEPARATE EVALUATION FOLLOWS--EACH PROBLEM HERE IS NEW, NOT RESPONDING TO TREATMENT, OR POSES SIGNIFICANT RISK TO THE PATIENT'S HEALTH: HISTORY OF THE PRESENT ILLNESS: pt states 15 years of  intermittent moderate pain at the left 5th toe.  no assoc rash she says (solid=liquid) dysphagia has recurred.  she had it 15 years ago. elev ldl is noted.  he did not tolerate mevacor PAST MEDICAL HISTORY reviewed and up to date today REVIEW OF SYSTEMS: denies weight loss and chest pain PHYSICAL EXAMINATION: abdomen is soft, nontender.  no hepatosplenomegaly.   not distended.  no hernia dorsalis pedis intact bilat.  no carotid bruit no deformity.  no ulcer on the feet.  feet are of normal color and temp.  no edema on the left 5th toe, there is slight skin irritation at the interdigital area LAB/XRAY RESULTS: Cholesterol LDL                  186.7 mg/dL IMPRESSION: dysphagia toe pain dyslipidemia, therapy is limited by multiple perceived drug intolerances PLAN: see instruction sheet    Impression & Recommendations:  Problem # 1:  ROUTINE GENERAL MEDICAL EXAM@HEALTH  CARE FACL (ICD-V70.0)  Other Orders: T-Parathyroid Hormone, Intact w/ Calcium [id number]) Podiatry Referral (Podiatry) Radiology Referral (Radiology) TLB-Lipid Panel (80061-LIPID) TLB-BMP (Basic Metabolic Panel-BMET) (99991111) TLB-CBC Platelet - w/Differential (85025-CBCD) TLB-Hepatic/Liver Function Pnl (80076-HEPATIC) TLB-TSH (Thyroid Stimulating Hormone) (84443-TSH) TLB-Udip w/ Micro (81001-URINE) Est. Patient Level IV (99214) Est. Patient 65& > [id number])  Patient Instructions: 1)  refer podiatry.  you will be called with a day and time for an appointment 2)  check swallowing x ray.  you will be called with a day and time for an appointment 3)  please consider these measures for your health:  minimize alcohol.  do not use tobacco products.  have a colonoscopy at least every 10 years from age 36.  keep firearms safely stored.  always use seat belts.  have working smoke alarms in your home.  see an eye doctor and dentist regularly.  never drive under the influence of alcohol or drugs (including  prescription drugs).  those with fair skin should take precautions against the sun. 4)  please let me know what your wishes would be, if artificial life support measures should become necessary.  it is critically important to prevent falling down (keep floor areas well-lit, dry, and free of loose objects). 5)  (update:  we discussed code status.  pt requests full code, but would not want to be started or maintained on artificial life-support measures if there was not a reasonable chance of recovery) 6)  (update: i left message on phone-tree:  call if you want zetia and/or colestipol).   Preventive Care Screening  Mammogram:    Date:  10/03/2009    Results:  normal   Pap Smear:    Date:  10/03/2009    Results:  normal      gyn is dr Gertie Fey

## 2010-06-04 NOTE — Assessment & Plan Note (Signed)
Summary: Cardiology Nuclear Study  Nuclear Med Background Indications for Stress Test: Evaluation for Ischemia, Post Hospital  Indications Comments: 07/10/09 CP (-) enzymes   History: COPD, Echo, Myocardial Perfusion Study  History Comments: '06 Echo:EF=60%; '07 [id number], EF=65%  Symptoms: Chest Pressure, Chest Pressure with Exertion, DOE, Palpitations, Rapid HR  Symptoms Comments: Last episode of [id number] since discharge, did c/o (L) arm pain yesterday.   Nuclear Pre-Procedure Cardiac Risk Factors: CVA, Family History - CAD, Hypertension, Lipids, TIA Caffeine/Decaff Intake: None NPO After: 6:00 AM Lungs: Clear.  O2 Sat 96% on RA. IV 0.9% NS with Angio Cath: 22g     IV Site: (R) Hand IV Started by: Irven Baltimore RN Chest Size (in) 36     Cup Size B     Height (in): 63.5 Weight (lb): 134 BMI: 23.45  Nuclear Med Study 1 or 2 day study:  1 day     Stress Test Type:  Carlton Adam Reading MD:  [name], MD     Referring MD:  Bing Quarry, MD Resting Radionuclide:  Technetium 41m Tetrofosmin     Resting Radionuclide Dose:  10.0 mCi  Stress Radionuclide:  Technetium 41m Tetrofosmin     Stress Radionuclide Dose:  33.0 mCi   Stress Protocol   Lexiscan: 0.4 mg   Stress Test Technologist:  Valetta Fuller CMA-N     Nuclear Technologist:  Mariann Laster Deal RT-N  Rest Procedure  Myocardial perfusion imaging was performed at rest 45 minutes following the intravenous administration of Myoview Technetium 50m Tetrofosmin.  Stress Procedure  The patient received IV Lexiscan 0.4 mg over 15-seconds.  Myoview injected at 30-seconds.  There were nonspecific  T-wave changes with lexiscan.  Quantitative spect images were obtained after a 45 minute delay.  QPS Raw Data Images:  Normal; no motion artifact; normal heart/lung ratio. Stress Images:  NI: Uniform and normal uptake of tracer in all myocardial segments. Rest Images:  Uniform and normal uptake of tracer in all myocardial  segments. Subtraction (SDS):  There is no evidence of scar or ischemia. Transient Ischemic Dilatation:  .97  (Normal <1.22)  Lung/Heart Ratio:  .21  (Normal <0.45)  Quantitative Gated Spect Images QGS EDV:  74 ml QGS ESV:  20 ml QGS EF:  72 % QGS cine images:  Normal wall motion.    Overall Impression  Exercise Capacity: Lexiscan BP Response: Normal blood pressure response. Clinical Symptoms: Short of breath.  ECG Impression: Insignificant upsloping ST segment depression. Overall Impression: Normal stress nuclear study.  Appended Document: Cardiology Nuclear Study reviewed with patient during office visit.

## 2010-06-04 NOTE — Progress Notes (Signed)
  Phone Note Call from Patient   Caller: Patient Summary of Call: pt left message on triage pt is out of calcitriol wants rx [facility]--pt asked if she can have about a [facility]'s worth [facility] until [facility] arrives--please advise Initial call taken by: Rebeca Alert MA,  October 31, 2009 1:49 PM  Follow-up for Phone Call        yes - ok to send calcitriol 90 day with 3 refills as well as local rx for same as needed  Follow-up by: Rowe Clack MD,  October 31, 2009 2:08 PM  Additional Follow-up for Phone Call Additional follow up Details #1::        pt notified week supply sent to pharmacy--rx sent to [facility] Additional Follow-up by: Rebeca Alert MA,  October 31, 2009 3:38 PM    Prescriptions: CALCITRIOL 0.25 MCG CAPS (CALCITRIOL) qd  #90 x 3   Entered by:   Rebeca Alert MA   Authorized by:   Donavan Foil MD   Signed by:   Rebeca Alert MA on 10/31/2009   Method used:   Faxed to ...       [facility] (mail-order)             , [place]         Ph:        Fax: [phone]   [id number] CALCITRIOL 0.25 MCG CAPS (CALCITRIOL) qd  #7 x 0   Entered by:   Rebeca Alert MA   Authorized by:   Donavan Foil MD   Signed by:   Rebeca Alert MA on 10/31/2009   Method used:   [facility]  [address] [phone] (retail[facility]       [address], [place]  [address]       Ph: [phone] or [phone]       Fax: [phone]   RxID:   [id number]

## 2010-06-04 NOTE — Progress Notes (Signed)
Summary: Rx refill req  Phone Note Refill Request Message from:  Patient on March 06, 2010 11:13 AM  Refills Requested: Medication #1:  SYNTHROID 75 MCG TABS Take 1 tablet by mouth once a day   Dosage confirmed as above?Dosage Confirmed   Supply Requested: 1 year Pt's spouse called requesting refills to [facility]   Method Requested: Electronic Initial call taken by: Crissie Sickles, CMA,  March 06, 2010 11:13 AM    Prescriptions: SYNTHROID 75 MCG TABS (LEVOTHYROXINE SODIUM) Take 1 tablet by mouth once a day  #90 x 3   Entered by:   Crissie Sickles, CMA   Authorized by:   Donavan Foil MD   Signed by:   Crissie Sickles, CMA on 03/06/2010   Method used:   Electronically to        [facility] (retail)             ,          Ph: [phone]       Fax: [phone]   [id number]

## 2010-06-04 NOTE — Progress Notes (Signed)
Summary: chest pain  Phone Note Call from Patient Call back at Home Phone [phone]   Caller: Patient Reason for Call: Talk to Nurse Summary of Call: pt having chest pain and left arm pain Initial call taken by: Darnell Level,  July 09, 2009 8:14 AM  Follow-up for Phone Call        pt has been having pain about 3-4 days on the left side, is also having arm pain, better when laying around resting, worse when is up and moves around, heaviness, pain in left jaw and arm, having pain now and SOB advised pt should go to ER she refuses and ask that [place] call her, advised Lauren was not here yet and it might be 62min or so before she arrives, pt states she doesn't want to go to ER, pts cell # is [phone], she is on her way now w/her husband to his appt w/Dr Gordy Clement, RN  July 09, 2009 8:22 AM   Additional Follow-up for Phone Call Additional follow up Details #1::        Left message to call back. Theodosia Quay, RN, BSN  July 09, 2009 8:54 AM  I spoke with the pt and she was diagnosed with bronchitis 2 weeks ago.  The pt will complete her antibiotics on Thursday.  The pt is having CP, left arm and shoulder pain, jaw pain and SOB off and on for the past 3-4 days.  The pt said her symptoms are worse when she exerts herself.  The pt is currently having symptoms.  I advised her that she should go to the  [facility] ER for evaluation.  The pt agrees with plan.  Additional Follow-up by: Theodosia Quay, RN, BSN,  July 09, 2009 9:00 AM

## 2010-06-04 NOTE — Miscellaneous (Signed)
Summary: Orders Update  Clinical Lists Changes  Problems: Added new problem of CAROTID BRUIT, LEFT (ICD-785.9) Orders: Added new Test order of Carotid Duplex (Carotid Duplex) - Signed

## 2010-06-04 NOTE — Consult Note (Signed)
Summary: [facility]   Imported By: Phillis Knack 04/03/2010 14:07:54  _____________________________________________________________________  External Attachment:    Type:   Image     Comment:   External Document

## 2010-06-04 NOTE — Assessment & Plan Note (Signed)
Summary: ?[place]-lb   Vital Signs:  Patient profile:   70 year old female Height:      63.5 inches (161.29 cm) Weight:      137 pounds (62.27 kg) BMI:     23.97 O2 Sat:      95 % on Room air Temp:     98.7 degrees F (37.06 degrees C) oral Pulse rate:   69 / minute BP sitting:   114 / 70  (left arm) Cuff size:   regular  Vitals Entered By: Rebeca Alert CMA Deborra Medina) (March 18, 2010 2:19 PM)  O2 Flow:  Room air CC: Chest congestion, productive cough with mucus x 2 weeks/aj Is Patient Diabetic? No   Primary Provider:  Renato Shin, MD  CC:  Chest congestion and productive cough with mucus x 2 weeks/aj.  History of Present Illness: pt was seen at urgent-care for cough on 02/23/10, and again on 02/28/10.  she was rx'ed azithromycin, avelox, and cough medicine.   overall, she has had 3 weeks of prod-quality cough in the chest, and assoc nasal congestion.  the cough is painful at the left side of the chest.  Current Medications (verified): 1)  Metoprolol Succinate 50 Mg Xr24h-Tab (Metoprolol Succinate) .... Take One Tablet By Mouth Daily 2)  Adult Aspirin Low Strength 81 Mg  Tbdp (Aspirin) .... Take 1 By Mouth Once Daily 3)  Plavix 75 Mg  Tabs (Clopidogrel Bisulfate) .... Take 1 By Mouth Qd 4)  Fish Oil   Oil (Fish Oil) .... 1200mg  2 Tabs Once Daily 5)  Neurontin 300 Mg  Caps (Gabapentin) .... Take 1 By Mouth Two Times A Day Qd 6)  Synthroid 75 Mcg Tabs (Levothyroxine Sodium) .... Take 1 Tablet By Mouth Once A Day 7)  Calcitriol 0.25 Mcg Caps (Calcitriol) .... Qd 8)  Multivitamins  Tabs (Multiple Vitamin) .... Take 1 Tablet By Mouth Once A Day 9)  Vitamin D 1000 Unit  Tabs (Cholecalciferol) .... Take 1 Tablet By Mouth Once A Day 10)  Amlodipine Besylate 10 Mg Tabs (Amlodipine Besylate) .... Take One Tablet By Mouth Daily  Allergies (verified): 1)  ! Codeine 2)  ! Septra 3)  ! Mevacor 4)  ! Iodine 5)  ! Sulfa 6)  ! * Triamterene/hctz 7)  ! * Ivp Dye 8)  ! 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         earache is improved.  no wheezing.  Physical Exam  General:  normal appearance.   Head:  head: no deformity eyes: no periorbital swelling, no proptosis external nose and ears are normal mouth: no lesion seen Ears:  TM's intact and clear with normal canals with grossly normal hearing.   Lungs:  few rales at bases.  no wheezes   Impression & Recommendations:  Problem # 1:  COUGH (ICD-786.2) due to acute bronchitis  Other Orders: T-2 View CXR ([id number]) Est. Patient Level III [id number])  Patient Instructions: 1)  chest x-ray today.  please call [phone] to hear your test results. 2)  here is a sample of "advair"-100.  take 1 puff two times a day.  rinse mouth after using. 3)  (update: i left message on phone-tree:  rx as we discussed)   Orders Added: 1)  T-2 View CXR [[id number]] 2)  Est. Patient Level III [id number]   Immunization History:  Influenza Immunization History:    Influenza:  historical (02/02/2010)   Immunization History:  Influenza Immunization History:    Influenza:  Historical (02/02/2010)

## 2010-06-04 NOTE — Miscellaneous (Signed)
  Clinical Lists Changes  Medications: Rx of METOPROLOL SUCCINATE 50 MG XR24H-TAB (METOPROLOL SUCCINATE) Take one tablet by mouth daily;  #90 x 4;  Signed;  Entered by: Joelyn Oms RN;  [place]ized [place]: [place], [address] [facility];  Method used: Faxed to [facility], , , [place]  , Ph: , Fax: [phone]    Prescriptions: METOPROLOL SUCCINATE 50 MG XR24H-TAB (METOPROLOL SUCCINATE) Take one tablet by mouth daily  #90 x 4   Entered by:   Joelyn Oms RN   Authorized by:   Wadie Lessen, MD, [facility]   Signed by:   Joelyn Oms RN on 01/14/2010   Method used:   Faxed to ...       [facility] (mail-order)             , [place]         Ph:        Fax: [phone]   RxID:   [id number]

## 2010-06-04 NOTE — Progress Notes (Signed)
Summary: refill meds  Phone Note Refill Request Call back at Home Phone [phone] Message from:  Patient on Sep 24, 2009 3:56 PM  Refills Requested: Medication #1:  AMLODIPINE BESYLATE 10 MG TABS Take one tablet by mouth daily.   Supply Requested: 3 months [facility] with 3 refils.    Method Requested: Fax to [facility] Initial call taken by: Neil Crouch,  Sep 24, 2009 3:56 PM Caller: Patient  Follow-up for Phone Call        Rx faxed to pharmacy Follow-up by: Sidney Ace,  Sep 24, 2009 4:12 PM    Prescriptions: AMLODIPINE BESYLATE 10 MG TABS (AMLODIPINE BESYLATE) Take one tablet by mouth daily  #90 x 4   Entered by:   Sidney Ace   Authorized by:   Wadie Lessen, MD, [facility]   Signed by:   Sidney Ace on 09/24/2009   Method used:   Faxed to ...       [facility] (mail-order)             , [place]         Ph:        Fax: [phone]   RxID:   [id number]

## 2010-06-04 NOTE — Assessment & Plan Note (Signed)
Summary: Katrina Yang   Visit Type:  6 months follow up Primary Provider:  Renato Shin, MD  CC:  chest pain.  History of Present Illness: Patient had to go to bronchitis, and had arm pain.  She went to the hospital.  She is not having any current chest pain.    Current Medications (verified): 1)  Metoprolol Succinate 50 Mg Xr24h-Tab (Metoprolol Succinate) .... Take One Tablet By Mouth Daily 2)  Adult Aspirin Low Strength 81 Mg  Tbdp (Aspirin) .... Take 1 By Mouth Once Daily 3)  Plavix 75 Mg  Tabs (Clopidogrel Bisulfate) .... Take 1 By Mouth Qd 4)  Fish Oil   Oil (Fish Oil) .... 1200mg  2 Tabs Once Daily 5)  Neurontin 300 Mg  Caps (Gabapentin) .... Take 1 By Mouth Two Times A Day Qd 6)  Synthroid 75 Mcg Tabs (Levothyroxine Sodium) .... Take 1 Tablet By Mouth Once A Day 7)  Calcitriol 0.25 Mcg Caps (Calcitriol) .... Qd 8)  Multivitamins  Tabs (Multiple Vitamin) .... Take 1 Tablet By Mouth Once A Day 9)  Vitamin D 1000 Unit  Tabs (Cholecalciferol) .... Take 1 Tablet By Mouth Once A Day 10)  Amlodipine Besylate 10 Mg Tabs (Amlodipine Besylate) .... Take One Tablet By Mouth Daily  Allergies: 1)  ! Codeine 2)  ! Septra 3)  ! Mevacor 4)  ! Iodine 5)  ! Sulfa 6)  ! * Triamterene/hctz 7)  ! * Ivp Dye 8)  ! Flagyl  Vital Signs:  Patient profile:   70 year old female Height:      63.5 inches Weight:      135.25 pounds BMI:     23.67 Pulse rate:   55 / minute Pulse rhythm:   irregular Resp:     18 per minute BP sitting:   143 / 73  (left arm) Cuff size:   large  Vitals Entered By: Sidney Ace (July 26, 2009 10:34 AM)  Physical Exam  General:  Well developed, well nourished, in no acute distress. Head:  normocephalic and atraumatic Eyes:  PERRLA/EOM intact; conjunctiva and lids normal. Lungs:  Clear bilaterally to auscultation and percussion. Heart:  PMI non displaced.  Normal S1 and S2.  No rub or gallop. Abdomen:  Bowel sounds positive; abdomen soft and non-tender without masses,  organomegaly, or hernias noted. No hepatosplenomegaly. Extremities:  No clubbing or cyanosis. Neurologic:  Alert and oriented x 3.   Nuclear ETT  Procedure date:  07/17/2009  Findings:      QPS  Raw Data Images:  Normal; no motion artifact; normal heart/lung ratio. Stress Images:  NI: Uniform and normal uptake of tracer in all myocardial segments. Rest Images:  Uniform and normal uptake of tracer in all myocardial segments. Subtraction (SDS):  There is no evidence of scar or ischemia. Transient Ischemic Dilatation:  .97  (Normal <1.22)  Lung/Heart Ratio:  .21  (Normal <0.45)  Quantitative Gated Spect Images  QGS EDV:  74 ml QGS ESV:  20 ml QGS EF:  72 % QGS cine images:  Normal wall motion.    Overall Impression   Exercise Capacity: Lexiscan BP Response: Normal blood pressure response. Clinical Symptoms: Short of breath.  ECG Impression: Insignificant upsloping ST segment depression. Overall Impression: Normal stress nuclear study.   EKG  Procedure date:  07/26/2009  Findings:      No charge.  Not indicated.  NSR.Marland Kitchen  LAE, possible.  Impression & Recommendations:  Problem # 1:  CAROTID BRUIT, LEFT ([id number]) See results  of doppler.  Problem # 2:  HYPERCHOLESTEROLEMIA  IIA (ICD-272.0) Issues with statins  Problem # 3:  HYPERTENSION (ICD-401.9) controlled at present. Her updated medication list for this problem includes:    Metoprolol Succinate 50 Mg Xr24h-tab (Metoprolol succinate) .Marland Kitchen... Take one tablet by mouth daily    Adult Aspirin Low Strength 81 Mg Tbdp (Aspirin) .Marland Kitchen... Take 1 by mouth once daily    Amlodipine Besylate 10 Mg Tabs (Amlodipine besylate) .Marland Kitchen... Take one tablet by mouth daily  Problem # 4:  CHEST PAIN-UNSPECIFIED (ICD-786.50) resolved.  See report. Her updated medication list for this problem includes:    Metoprolol Succinate 50 Mg Xr24h-tab (Metoprolol succinate) .Marland Kitchen... Take one tablet by mouth daily    Adult Aspirin Low Strength 81 Mg Tbdp  (Aspirin) .Marland Kitchen... Take 1 by mouth once daily    Plavix 75 Mg Tabs (Clopidogrel bisulfate) .Marland Kitchen... Take 1 by mouth qd    Amlodipine Besylate 10 Mg Tabs (Amlodipine besylate) .Marland Kitchen... Take one tablet by mouth daily  Patient Instructions: 1)  Your physician recommends that you continue on your current medications as directed. Please refer to the Current Medication list given to you today. 2)  Your physician wants you to follow-up in 1 year:   You will receive a reminder letter in the mail two months in advance. If you don't receive a letter, please call our office to schedule the follow-up appointment.

## 2010-06-06 NOTE — Assessment & Plan Note (Signed)
Summary: diff swallowing/#cd   Vital Signs:  Patient profile:   70 year old female Height:      63.5 inches (161.29 cm) Weight:      136.25 pounds (61.93 kg) BMI:     23.84 O2 Sat:      96 % on Room air Temp:     98.3 degrees F (36.83 degrees C) oral Pulse rate:   67 / minute BP sitting:   140 / 80  (left arm) Cuff size:   regular  Vitals Entered By: Rebeca Alert CMA Deborra Medina) (May 16, 2010 3:29 PM)  O2 Flow:  Room air CC: Difficulty swallowing x 6 weeks (dry mouth, thirsty, burns throat when drinking acidic juices, choking on certain foods)/aj Is Patient Diabetic? No   Primary Provider:  Renato Shin, MD  CC:  Difficulty swallowing x 6 weeks (dry mouth, thirsty, burns throat when drinking acidic juices, and choking on certain foods)/aj.  History of Present Illness: pt hasn't yet had the ba swallow.  she has few mos of moderate dryness in the mouth, and assoc burning sensation with swallowing, but she says she does not have painful swallowing.  she has increased thirst.    Current Medications (verified): 1)  Metoprolol Succinate 50 Mg Xr24h-Tab (Metoprolol Succinate) .... Take One Tablet By Mouth Daily 2)  Adult Aspirin Low Strength 81 Mg  Tbdp (Aspirin) .... Take 1 By Mouth Once Daily 3)  Plavix 75 Mg  Tabs (Clopidogrel Bisulfate) .... Take 1 By Mouth Qd 4)  Fish Oil   Oil (Fish Oil) .... 1200mg  2 Tabs Once Daily 5)  Neurontin 300 Mg  Caps (Gabapentin) .... Take 1 By Mouth Two Times A Day Qd 6)  Synthroid 75 Mcg Tabs (Levothyroxine Sodium) .... Take 1 Tablet By Mouth Once A Day 7)  Calcitriol 0.25 Mcg Caps (Calcitriol) .... Qd 8)  Multivitamins  Tabs (Multiple Vitamin) .... Take 1 Tablet By Mouth Once A Day 9)  Vitamin D 1000 Unit  Tabs (Cholecalciferol) .... Take 1 Tablet By Mouth Once A Day 10)  Amlodipine Besylate 10 Mg Tabs (Amlodipine Besylate) .... Take One Tablet By Mouth Daily  Allergies (verified): 1)  ! Codeine 2)  ! Septra 3)  ! Mevacor 4)  ! Iodine 5)  !  Sulfa 6)  ! * Triamterene/hctz 7)  ! * Ivp Dye 8)  ! 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         she also feels as though the food is getting stuck in the throat, solids > liquids.  Physical Exam  General:  normal appearance.   Head:  head: no deformity eyes: no periorbital swelling, no proptosis external nose and ears are normal mouth: no lesion seen  Neck:  Supple without thyroid enlargement or tenderness.  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 1 tab once daily  Other Orders: Radiology Referral (Radiology) TLB-TSH (Thyroid Stimulating Hormone) (84443-TSH) TLB-BMP (Basic Metabolic Panel-BMET) (99991111) Est. Patient Level IV [id number])  Patient Instructions: 1)  blood tests are being ordered for you today.  please call [phone] to hear your test results. 2)  check baruim swallow.  you will be called with a day and time for an appointment. 3)  (update: i left message on phone-tree:  increase synthroid to 88  micrograms/day). Prescriptions: LEVOTHYROXINE SODIUM 88 MCG TABS (LEVOTHYROXINE SODIUM) 1 tab once daily  #90 x 3   Entered and Authorized by:   Donavan Foil MD   Signed by:   Donavan Foil MD on 05/17/2010   Method used:   Electronically to        [facility] (retail)             ,          Ph: [phone]       Fax: [phone]   [id number]    Orders Added: 1)  Radiology Referral [Radiology] 2)  TLB-TSH (Thyroid Stimulating Hormone) [84443-TSH] 3)  TLB-BMP (Basic Metabolic Panel-BMET) 123456 4)  Est. Patient Level IV [id number]

## 2010-06-07 NOTE — Letter (Signed)
Summary: [facility]  [facility]   Imported By: Bubba Hales 03/29/2010 08:38:21  _____________________________________________________________________  External Attachment:    Type:   Image     Comment:   External Document

## 2010-07-26 LAB — COMPREHENSIVE METABOLIC PANEL
ALT: 19 U/L (ref 0–35)
AST: 28 U/L (ref 0–37)
Albumin: 3.2 g/dL — ABNORMAL LOW (ref 3.5–5.2)
Alkaline Phosphatase: 81 U/L (ref 39–117)
BUN: 27 mg/dL — ABNORMAL HIGH (ref 6–23)
Chloride: 105 mEq/L (ref 96–112)
GFR calc Af Amer: 41 mL/min — ABNORMAL LOW (ref >60)
Potassium: 3.9 mEq/L (ref 3.5–5.1)
Sodium: 139 mEq/L (ref 135–145)
Total Bilirubin: 0.5 mg/dL (ref 0.3–1.2)
Total Protein: 6.4 g/dL (ref 6.0–8.3)

## 2010-07-26 LAB — DIFFERENTIAL
Basophils Relative: 0 % (ref 0–1)
Eosinophils Relative: 3 % (ref 0–5)
Monocytes Absolute: 0.4 10*3/uL (ref 0.1–1.0)
Monocytes Relative: 6 % (ref 3–12)
Neutro Abs: 5.2 10*3/uL (ref 1.7–7.7)

## 2010-07-26 LAB — POCT CARDIAC MARKERS
CKMB, poc: 2 ng/mL (ref 1.0–8.0)
Troponin i, poc: <0.05 ng/mL (ref 0.00–0.09)

## 2010-07-26 LAB — PROTIME-INR
INR: 0.98 (ref 0.00–1.49)
Prothrombin Time: 12.9 seconds (ref 11.6–15.2)

## 2010-07-26 LAB — CBC
HCT: 37.6 % (ref 36.0–46.0)
Hemoglobin: 12.7 g/dL (ref 12.0–15.0)
RBC: 4.1 MIL/uL (ref 3.87–5.11)
WBC: 7.8 10*3/uL (ref 4.0–10.5)

## 2010-07-26 LAB — CARDIAC PANEL(CRET KIN+CKTOT+MB+TROPI)
CK, MB: 2.2 ng/mL (ref 0.3–4.0)
CK, MB: 2.5 ng/mL (ref 0.3–4.0)
Relative Index: INVALID (ref 0.0–2.5)
Total CK: 71 U/L (ref 7–177)
Total CK: 76 U/L (ref 7–177)

## 2010-07-26 LAB — CK TOTAL AND CKMB (NOT AT ARMC)
CK, MB: 3.3 ng/mL (ref 0.3–4.0)
Relative Index: 3.3 — ABNORMAL HIGH (ref 0.0–2.5)

## 2010-07-26 LAB — LIPID PANEL
Triglycerides: 108 mg/dL (ref <150)
VLDL: 22 mg/dL (ref 0–40)

## 2010-07-26 LAB — TROPONIN I: Troponin I: <0.01 ng/mL (ref 0.00–0.06)

## 2010-07-30 ENCOUNTER — Encounter: Payer: Self-pay | Admitting: Cardiology

## 2010-08-01 ENCOUNTER — Encounter: Payer: Self-pay | Admitting: Cardiology

## 2010-08-01 ENCOUNTER — Ambulatory Visit (INDEPENDENT_AMBULATORY_CARE_PROVIDER_SITE_OTHER): Payer: Medicare Other | Admitting: Cardiology

## 2010-08-01 VITALS — BP 158/86 | HR 60 | Ht 63.0 in | Wt 132.8 lb

## 2010-08-01 DIAGNOSIS — I6350 Cerebral infarction due to unspecified occlusion or stenosis of unspecified cerebral artery: Secondary | ICD-10-CM

## 2010-08-01 DIAGNOSIS — E7800 Pure hypercholesterolemia, unspecified: Secondary | ICD-10-CM

## 2010-08-01 DIAGNOSIS — I10 Essential (primary) hypertension: Secondary | ICD-10-CM

## 2010-08-01 NOTE — Patient Instructions (Signed)
Your physician recommends that you continue on your current medications as directed. Please refer to the Current Medication list given to you today.  Your physician wants you to follow-up in: 6 MONTHS. You will receive a reminder letter in the mail two months in advance. If you don't receive a letter, please call our office to schedule the follow-up appointment.  

## 2010-08-04 LAB — HM PAP SMEAR: HM Pap smear: NORMAL

## 2010-08-04 NOTE — Assessment & Plan Note (Signed)
Had a statin reaction and does not take.

## 2010-08-04 NOTE — Assessment & Plan Note (Addendum)
We discussed her current medication.  She will continue to monitor her BP at this point in time.  She currently has a sinus infection, and is on antibiotics, so that may be driving the BP a little at this point.  Encouraged to monitor.

## 2010-08-04 NOTE — Assessment & Plan Note (Signed)
Remains on plavix via her primary MD.  Will not change.

## 2010-08-04 NOTE — Progress Notes (Signed)
HPI:  Katrina Yang is doing pretty well.  She is still teaching school.  She does not have any new issues, although her BP is staying somewhat up as a general rule.  Denies any chest pain at the present time.    Current Outpatient Prescriptions  Medication Sig Dispense Refill  . amLODipine (NORVASC) 10 MG tablet Take 10 mg by mouth daily.        . calcitRIOL (ROCALTROL) 0.25 MCG capsule Take 0.25 mcg by mouth daily.        . Cholecalciferol (VITAMIN D) 1000 UNITS capsule Take 1,000 Units by mouth daily.        . clopidogrel (PLAVIX) 75 MG tablet Take 75 mg by mouth daily.        Marland Kitchen gabapentin (NEURONTIN) 300 MG capsule Take 300 mg by mouth 2 (two) times daily.        . Levothyroxine Sodium 88 MCG CAPS Take 1 capsule by mouth daily.        . metoprolol (TOPROL-XL) 50 MG 24 hr tablet 50 mg. 1/2 in morning 1/2 in the evening      . moxifloxacin (AVELOX) 400 MG tablet Take 400 mg by mouth daily.        . Multiple Vitamin (MULTIVITAMIN) tablet Take 1 tablet by mouth daily.        . Omega-3 Fatty Acids (FISH OIL) 1200 MG CAPS Take 1 capsule by mouth 2 (two) times daily.        Marland Kitchen aspirin 81 MG tablet Take 81 mg by mouth daily.          Allergies  Allergen Reactions  . Codeine   . Iodine   . Iohexol   . Lovastatin   . Metronidazole     REACTION: Reaction not known  . Sulfamethoxazole W/Trimethoprim   . Sulfonamide Derivatives     Past Medical History  Diagnosis Date  . Hypertension   . Palpitations   . Pure hypercholesterolemia   . CVA (cerebral vascular accident)     hx of  . Secondary hyperparathyroidism (of renal origin)   . Pulmonary nodule   . GERD (gastroesophageal reflux disease)   . Diverticulosis of colon (without mention of hemorrhage)   . Colonic polyp     hx of  . Degenerative joint disease   . Personal history of unspecified urinary disorder   . Osteoporosis, unspecified   . Unspecified hypothyroidism   . History of hysterectomy     Past Surgical History  Procedure  Date  . Back surgery     diskectomy L-5. Multiple back surgeries    Family History  Problem Relation Age of Onset  . Kidney cancer Father   . Heart attack Father   . Colon cancer Mother   . Ovarian cancer Mother   . Heart disease Mother   . Heart disease Father     History   Social History  . Marital Status: Married    Spouse Name: N/A    Number of Children: 1  . 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 Smoking status: Never Smoker   . Smokeless tobacco: Not on file  . Alcohol Use: No  . Drug Use: No  . Sexually Active: Not on file   Other Topics Concern  . Not on file   Social History Narrative  . No narrative on file    ROS: Please see the HPI.  All  other systems reviewed and negative.  PHYSICAL EXAM:  BP 158/86  Pulse 60  Ht 5\' 3"  (1.6 m)  Wt 132 lb 12.8 oz (60.238 kg)  BMI 23.52 kg/m2  General: Well developed, well nourished, in no acute distress. Head:  Normocephalic and atraumatic. Neck: no JVD Lungs: Clear to auscultation and percussion. Heart: Normal S1 and S2.  No murmur, rubs or gallops.  Abdomen:  Normal bowel sounds; soft; non tender; no organomegaly Pulses: Pulses normal in all 4 extremities. Extremities: No clubbing or cyanosis. No edema. Neurologic: Alert and oriented x 3.  EKG:  NSR.  WNL.  ASSESSMENT AND PLAN:

## 2010-08-26 ENCOUNTER — Ambulatory Visit (INDEPENDENT_AMBULATORY_CARE_PROVIDER_SITE_OTHER): Payer: Medicare Other | Admitting: Endocrinology

## 2010-08-26 ENCOUNTER — Ambulatory Visit (INDEPENDENT_AMBULATORY_CARE_PROVIDER_SITE_OTHER)
Admission: RE | Admit: 2010-08-26 | Discharge: 2010-08-26 | Disposition: A | Discharge status: Home / Self Care | Payer: Medicare Other | Source: Ambulatory Visit | Attending: Endocrinology | Admitting: Endocrinology

## 2010-08-26 ENCOUNTER — Encounter: Payer: Self-pay | Admitting: Endocrinology

## 2010-08-26 VITALS — BP 122/70 | HR 74 | Temp 97.6°F | Ht 64.0 in | Wt 133.1 lb

## 2010-08-26 DIAGNOSIS — R05 Cough: Secondary | ICD-10-CM

## 2010-08-26 DIAGNOSIS — R059 Cough, unspecified: Secondary | ICD-10-CM

## 2010-08-26 NOTE — Patient Instructions (Addendum)
A chest x-ray is being ordered for you today.  please call [phone] to hear your test results. here is a sample of "advair-100."  take 1 puff 2x a day.  rinse mouth after using.  Call if this helps, so i can send a prescription to your pharmacy. Call in a few days if you are not improving. (update: i left message on phone-tree:  rx as we discussed)

## 2010-08-26 NOTE — Progress Notes (Signed)
  Subjective:    Patient ID: Katrina Yang, female    DOB: [birth year], 70 y.o.   MRN: [id number]  HPI 1 month of prod quality cough in the chest, and assoc pain.  She was seen at urgent care, and rx'ed avelox.  No improvement yet.  Past Medical History  Diagnosis Date  . Hypertension   . Palpitations   . Pure hypercholesterolemia   . CVA (cerebral vascular accident)     hx of  . Secondary hyperparathyroidism (of renal origin)   . Pulmonary nodule   . GERD (gastroesophageal reflux disease)   . Diverticulosis of colon (without mention of hemorrhage)   . Colonic polyp     hx of  . Degenerative joint disease   . Personal history of unspecified urinary disorder   . Osteoporosis, unspecified   . Unspecified hypothyroidism   . History of hysterectomy    Past Surgical History  Procedure Date  . Back surgery     diskectomy L-5. Multiple back surgeries  . Abdominal hysterectomy     reports that she has never smoked. She does not have any smokeless tobacco history on file. She reports that she does not drink alcohol or use illicit drugs. family history includes Colon cancer in her mother; Heart attack in her father; Heart disease in her father and mother; Kidney cancer in her father; and Ovarian cancer in her mother. Allergies  Allergen Reactions  . Codeine   . Iodine   . Iohexol   . Lovastatin   . Metronidazole     REACTION: Reaction not known  . Sulfamethoxazole W/Trimethoprim   . Sulfonamide Derivatives      Review of Systems She also has slight doe.  Wheezing is better.     Objective:   Physical Exam head: no deformity eyes: no periorbital swelling, no proptosis external nose and ears are normal mouth: no lesion seen Ears:  Both eac's and tm's are normal LUNGS:  Clear to auscultation, except for rales at both bases.    Cxr: nad   Assessment & Plan:  Acute bronchitis, persistent

## 2010-09-17 NOTE — Assessment & Plan Note (Signed)
[place] OFFICE NOTE   MAKAYAH, EIDEM                      MRN:          [id number]  DATE:04/12/2007                            DOB:          1941-01-14    Shaely is in for followup.  She has really been stable.  She has not  been checking her blood pressure, but she says she does not fell bad.  Otherwise, she has been without symptoms and no neurologic symptoms.   MEDICATIONS:  1. Tenormin 50 mg one half tablet p.o. b.i.d.  2. Multivitamin daily.  3. Aspirin 81 mg daily.  4. Plavix 75 mg daily.  5. Norvasc 5 mg daily.  6. Fish oil 1200 mg daily.  7. Neurontin 300 mg daily.  8. Synthroid 75 mcg daily.   PHYSICAL EXAMINATION:  VITAL SIGNS:  Blood pressure 158/87, pulse 52.  LUNGS:  Clear.  CARDIAC:  Regular rhythm.  I do not appreciate a significant murmur.   Her electrocardiogram demonstrates sinus bradycardia, otherwise  unremarkable.   IMPRESSION:  1. Hypertension on combination beta-blockers and calcium channel      blockers (she has tolerated atenolol in the past and does not want      to change this).  2. Prior transient ischemic attack, uncertain etiology.  3. Hypercholesterolemia.   PLAN:  1. Return to clinic in 6 months.  2. Continued followup with Dr. Loanne Drilling.   ADDENDUM:  Of note, Donnika did have an abnormal CT scan, suggestive of a  6 mm right lower lobe nodule.  Dr. Terance Hart has arranged for Dr. Loanne Drilling  to do a followup CT on this.  Her previous findings included apical  scarring with focal pleural thickening in the right lung apex.     [name]. Lia Foyer, MD, [facility]  Electronically Signed    TDS/MedQ  DD: 04/12/2007  DT: 04/12/2007  Job #: [id number]

## 2010-09-17 NOTE — Assessment & Plan Note (Signed)
[place] OFFICE NOTE   ISAMARA, ALMER                      MRN:          [id number]  DATE:10/22/2006                            DOB:          1940-12-20    Ms. Decelles is in for followup. She has been watching her diet carefully  and exercising some. Her LDL has come down about 50 points. It is now  106. She had a bad experience previously taking statins and is reluctant  to resume this. In general, she is doing reasonably well, probably the  best she has done in some time. She and [name] are getting ready to take a  trip out [place] on a bus for about a month.   PHYSICAL EXAMINATION:  Currently the weight is 132 pounds, blood  pressure is 112/70, pulse 55.  The lung fields are clear.  Cardiac rhythm is regular.   EKG reveals sinus bradycardia, otherwise normal.   The patient continues to do well from a medical standpoint. We renewed  her medicines today which include Norvasc 5 mg daily, Tenormin 50 mg one-  half tablet p.o. b.i.d. and we did this for one year. The remainder of  her medications are non-cardiac. I will see her back in followup in six  months.     [name]. Lia Foyer, MD, [facility]  Electronically Signed    TDS/MedQ  DD: 10/22/2006  DT: 10/22/2006  Job #: [id number]

## 2010-09-17 NOTE — Assessment & Plan Note (Signed)
[place] OFFICE NOTE   HALAYA, DENNE                      MRN:          [id number]  DATE:11/08/2007                            DOB:          Apr 19, 1941    [name] is in today for followup.  She talked to Sharyl Nimrod last  night.  Her blood pressures were high and she was feeling dizzy,  especially when she got up.  All the numbers were elevated, although  they do not have a specific standing up number.  Today, the numbers in  the office ranged from 119/74 supine to 106/67 standing.  She has only  taken 1 Norvasc this morning.  She is no longer dizzy.   PHYSICAL EXAMINATION:  VITAL SIGNS:  Today, her blood pressure is 132/80  and pulse is 70.  LUNG:  Fields are clear.   The electrocardiogram demonstrates sinus rhythm and is unremarkable.   Her numbers at home from last night ranged from 177/87 with 181/82 to  143/77.  She is going to increase her Norvasc.  I have reminded her that  she could develop problems with regard to swelling in the lower  extremities.  She is going to continue to check these blood pressures.  I will see her back fairly and promptly in a week or so.   ADDENDUM  We will actually ask her to take 1-1/2 tablets of 7.5 mg a day in the  morning and check the blood pressures.  We will see her back in a week  and readjust.     [name]. Lia Foyer, MD, [facility]  Electronically Signed    TDS/MedQ  DD: 11/08/2007  DT: 11/09/2007  Job #: [id number]   cc:   Hilliard Clark A. Loanne Drilling, MD

## 2010-09-17 NOTE — Assessment & Plan Note (Signed)
[place] OFFICE NOTE   JAYMESON, BAKEMAN                      MRN:          [id number]  DATE:11/18/2007                            DOB:          [birth year]    HISTORY:  Katrina Yang is in for followup.  In general, she has been  relatively stable, but she continues to have a fair amount of  fluctuation in blood pressure.  She remains concerned about her blood  pressure control.  More recently, she has been taking her Norvasc, and  sort of dividing it up as a one-half tablet 3 times a day.  She, her  husband Joe, and I have reviewed the pharmacology of amlodipine, we  talked about the fact that she could probably take it all at once or  utmost have to divide it up twice.  We also talked about the same with  Tenormin.  She has not had chest pain.  She is scheduled to see Dr. Erling Cruz  in the very near future.   She also had recent laboratory studies done with Dr. Loanne Drilling.  Her HDL  cholesterol was really quite high at 90.  Her total cholesterol was 250,  but her non-HDL cholesterol was 160.  The patient does have a strong  family history of coronary artery disease, but unfortunately, last time  she was challenged with a statin drug, she developed a stroke-like  symptomatology, for which she was subsequently seen by Dr. Erling Cruz, and  this was promptly discontinued.  Her other laboratory studies include a  hemoglobin of 13.2 and platelet count of 231,000.  BUN and creatinine  are normal.  Urinalysis is negative.  The liver functions are  unremarkable.  Direct LDL is 142 with a fast TSH of 2.66.   Recent CT scan was done by Dr. Loanne Drilling.  This was apparently in followup  from what had been seen earlier.  There was thought to be multiple  bilateral stable pulmonary nodules.  A followup CT scan was recommended  in 18-24 months.  Please see all of the CT reports.   CURRENT MEDICATIONS:  1. Tenormin 50 mg 1/2 tablet b.i.d.  2. Multivitamin daily.  3. Aspirin 81 mg daily.  4. Plavix 75 mg daily.  5. Fish oil 1200 mg daily.  6. Neurontin 300 mg daily.  7. Synthroid 75 mcg daily.  8. Norvasc 5 mg 1-1/2 tablets daily.   PHYSICAL EXAMINATION:  VITAL SIGNS:  Blood pressure is 142/84 and pulse  is 60.  RESPIRATORY:  The lung fields are clear.  CARDIAC:  Rhythm is regular.   Of note, the patient has had a previous transesophageal echocardiogram.  At that time, done in 2005 by Dr. Harrington Challenger, there was minimal aortic valve  thickening, and there was minimal fixed plaque in the thoracic aorta, no  PFO was identified, and there was no left atrial appendage identified as  well.   IMPRESSION:  1. Labile hypertension.  2. Hypothyroidism, on replacement therapy.  3. History of prior stroke.  4. Hypercholesterolemia.   PLAN:  There are various options here.  She is going to see Dr. Erling Cruz.  She and Joe are going to keep a close eye on what her blood pressures  are, and we will continue to follow these up.  We have talked about  various lipid strategies.  She does have an LDL cholesterol, and she has  had prior lipomet evaluation.  At that time, her LDL-C was 160 with a  total cholesterol of 263.  Her LDL particle size was slightly on higher  side, and her small LDL particles size was only borderline high.  Potentially, this does give [place] some options.  She is reasonably fearful  of the lipid-lowering agents.  We will see what Dr. Erling Cruz thinks when he  sees her, and when I see her back in followup, we can talk about various  strategies.  One option would be to start with very low-dose Crestor at  5 mg daily, but we will defer this at the present time based on the  previous above parameters.     [name]. Lia Foyer, MD, [facility]  Electronically Signed    TDS/MedQ  DD: 11/19/2007  DT: 11/19/2007  Job #: [id number]

## 2010-09-17 NOTE — Assessment & Plan Note (Signed)
[place] OFFICE NOTE   PHEBA, TAKADA                      MRN:          [id number]  DATE:04/12/2007                            DOB:          [birth year]    ADDENDUM:  Of note, Rainell did have an abnormal CT scan, suggestive of a  6 mm right lower lobe nodule.  Dr. Terance Hart has arranged for Dr. Loanne Drilling  to do a followup CT on this.  Her previous findings included apical  scarring with focal pleural thickening in the right lung apex.     [name]. Lia Foyer, MD, [facility]  Electronically Signed    TDS/MedQ  DD: 04/12/2007  DT: 04/12/2007  Job #: [id number]

## 2010-09-17 NOTE — Assessment & Plan Note (Signed)
[place] OFFICE NOTE   Katrina Yang, Katrina Yang                      MRN:          [id number]  DATE:06/26/2008                            DOB:          07-Sep-1940    Katrina Yang is in for a followup.  She has had some discomfort along the  posterior back, and down the left leg.  She believes this is all  previously related to her stroke.  She has not been checking her blood  pressures recently.  She says she is fairly fatigued as is her husband.  It is of note that they have not been exercising much, having previously  gone to the mall, but not doing much in the last few months.  Importantly, she is tender over the left back.  She has also had a  little bit of chest discomfort that sometimes radiates down the left  arm, but is not associated with diaphoresis, shortness of breath, or  nausea.   Today, on examination she is carefully examined.  The blood pressure is  170/94 initially, when checked by me it was 166/90 in both the left and  right arms.  Pulses were equal bilaterally.  She clearly is tender over  the left trap area.  This reproduces the pain.  Her cardiac rhythm is  otherwise regular.  She has had some serial CT scans and no mention is  made of dissection, although it is a noncontrast study.   Medications currently include:  1. Tenormin 50 mg one-half b.i.d.  2. Multivitamin daily.  3. Aspirin 81 mg daily.  4. Plavix 75 mg daily.  5. Fish oil 1200 mg daily.  6. Neurontin 300 mg daily.  7. Norvasc 5 mg one and a half daily.  8. Synthroid 100 mcg daily.  9. Vitamin D.   On physical, the blood pressure is 170/94, the pulse is 56 as noted.  Pulses are equal bilaterally.  Blood pressure is rechecked by me are as  noted.  Extremities reveal no edema.   IMPRESSION:  1. Hypertension.  2. Prior stroke.  3. Hypothyroidism.  4. Hypercholesterolemia.   PLAN:  1. Continue current medical regimen.  2. We  will see her back in 6 weeks.  3. I have asked she and her husband to start exercising and also to      take her blood pressure, and if her pressures remain elevated she      will call us and we will make adjustments in her      medications.  4. She will see Dr. Erling Cruz back in followup.     [name]. Lia Foyer, MD, [facility]  Electronically Signed    TDS/MedQ  DD: 06/26/2008  DT: 06/27/2008  Job #: [id number]

## 2010-09-17 NOTE — Assessment & Plan Note (Signed)
Katrina Yang                                 ON-CALL NOTE   Katrina Yang, Katrina Yang                      MRN:          [id number]  DATE:11/07/2007                            DOB:          09/02/1940    PRIMARY CARDIOLOGIST:  Dr. Marcello Moores D. Lia Foyer, MD, [facility]   HISTORY:  Katrina Yang is a 70 year old female who called at approximately  9:10 this morning complaining of high blood pressure.  She states that  her blood pressure has been elevated at least for last week when she saw  her primary care physician, however, she states she did not follow or  check her blood pressures regularly.  She also states in the last 2-3  weeks she has noted an increasing amount of dizziness particularly this  morning.  She states she was so dizzy, she was unable to get out of bed.  Her husband did check her blood pressure this morning and it was in the  180s.  The patient took her medications, which include Norvasc and  Toprol and she also took an additional 2.5 mg of Norvasc with some  slight improvement, she called for further guidance.   Katrina Yang looking through over [place] has a history of strokes and  possible TIAs.  However, at this time she is not denying any  neurological deficits or resemblance of symptoms to prior problems.  I  explained to her it being Sunday that we will need to further evaluate  her and would be for her to come to the emergency room.  She was  reluctant to do so.  I suggested an alternative plan would be to take an  additional half a tablet of her Norvasc (2.5 mg) rest recheck her blood  pressure around lunch time.  If her dizziness would not improved or her  blood pressure, I explained to her that she would have to come to the  emergency room for further evaluation.   She calls me back at approximately 12:30 and states that now her blood  pressure is down in the 140s and she is no longer having any dizziness.  I asked her to increase her  Norvasc to 10 mg daily to begin a blood  pressure diary as well as to monitor her heart rate.  I have asked her  to record these at least on a daily basis hopefully several times a day  and to please document any associated symptoms.  I have also explained  to her that if her symptoms return today or if she develops further  hypertension that she should come to the emergency room and to please  call us back so we can notify them of her expected arrival.  I also  suggested to her if she wishes she could contact our office tomorrow to  see if she might be able to move up her appointment with  Dr. Lia Foyer from November 18, 2007, for further evaluation.  She is  agreeable to the above plan and stated that she will call us back if she  has any further  problems.      Sharyl Nimrod, PA-C  Electronically Signed      Minus Breeding, MD, [facility]  Electronically Signed   EW/MedQ  DD: 11/07/2007  DT: 11/07/2007  Job #: [id number]

## 2010-09-17 NOTE — Assessment & Plan Note (Signed)
[place] OFFICE NOTE   Katrina Yang, Katrina Yang                      MRN:          [id number]  DATE:04/12/2007                            DOB:          March 15, 1941    Katrina Yang is in for followup.  In general, she has been really pretty  stable.  Her blood pressure has been better than what it is normally  today.  Importantly, she is somewhat nervous about her followup CT scan  that is scheduled.  This is being arranged through either [facility] or Dr. Loanne Drilling.  She denies any chest pain.  Overall, her  functional status has been pretty good.  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   MEDICATIONS:  1. Tenormin 50 mg 1/2 tablet p.o. b.i.d.  2. Multivitamins.  3. Aspirin 81 mg daily.  4. Plavix 75 mg daily.  5. Norvasc 5 mg daily.  6. Fish oil 1200 mg daily.  7. Neurontin 300 mg daily.  8. Synthroid 75 mcg daily.  9. Flexeril 5 mg p.r.n.   PHYSICAL EXAMINATION:  She is alert and oriented, in no acute distress.  The blood pressure is today elevated at 158/87 and a pulse of 52.  The  weight is 135 pounds.  The lung fields are clear.  The cardiac rhythm is regular.   Katrina Yang continues to do reasonably well.  It is important that she keep  an eye on her blood pressure at home.  She is scheduled for followup.  Her CT scan in February per Dr. Terance Hart.  She will continue her  followup regularly with Dr. Loanne Drilling.  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  Note, the patient has had a  transesophageal echocardiogram several years ago that demonstrated no  evidence of PFO.  Finally, we have discussed the issue with regard to  data regarding atenolol, because this worked for the patient in the  past, she wishes to stay on this.   ADDENDUM:  EKG today reveals sinus bradycardia.  Otherwise, normal EKG.     [name]. Lia Foyer, MD, [facility]  Electronically Signed    TDS/MedQ  DD: 04/21/2007  DT: 04/21/2007  Job #: [id number]

## 2010-09-20 NOTE — Procedures (Signed)
EEG:  [id number].   CLINICAL HISTORY:  The patient is a 70 year old female with possible TIA.  Study is being done to look for presence of seizures.   PROCEDURE:  The patient is carried out on a 32-channel digital Cadwell  recorder reformatted to 16-channel montages with 1 devoted to EKG. The  patient was awake during the recording. Medications include aspirin, Plavix,  Calan, metoprolol, Premarin, multivitamin, folic acid and Synthroid.  International 10/20 system of lead placement was used.   DESCRIPTION OF FINDINGS:  Dominant frequency is a 9 hertz, 20 microvolt  activity that is well regulated and attenuates partially with eye opening.   Background activity shows a frontally predominant broadly distributed under  10 microvolt beta range activity.   There is no significant change in waking record. Activating procedures with  intermittent photic stimulation induced driving response between 5 and 11  hertz. Hyperventilation caused mild potentiation voltages and brief episodes  of lower theta/upper delta range activity but no seizure activity. There was  no focal slowing. There was no interictal epileptiform activity in the form  of spikes or sharp waves. EKG showed regular sinus rhythm with ventricular  response of 57 beats per minute.   IMPRESSION:  Normal waking record.       [id number]  D:  12/09/2004 19:45:15  T:  12/10/2004 11:25:07  Job #:  [id number]   cc:   Pramod P. Leonie Man, MD  Fax: [phone]

## 2010-09-20 NOTE — Assessment & Plan Note (Signed)
[place] OFFICE NOTE   ZIA, LINDSKOG                      MRN:          [id number]  DATE:04/13/2006                            DOB:          1940-06-13    [name] is in for follow up. She was recently in the emergency room  because of a stiff neck which eventually resolved. In general, she is  doing pretty well. She has not had really significant chest pain.  Importantly, she had had labs done by Dr. Loanne Drilling. Her total cholesterol  was 250 with a LDL of 156. He suggested that she go on a statin and gave  her a prescription, but she elected not to do this. She is revisiting  that question today. After being placed on Mevacor previously, she had a  reaction although the reaction was unusual for Mevacor and probably not  related to the drug. She and I had a lengthy discussion today with her  husband present regarding the pros and cons, and she was to consider  retrying it again.   On exam today, the blood pressure is 126/74, the pulse is 67.  The lung fields are clear.  The cardiac rhythm is regular.   The electrocardiogram demonstrates normal sinus rhythm, within normal  limits.   Overall, she is stable. She has been seen by Dr. Erling Cruz. She is back to  work two days a week. We will give her a prescription for Pravachol and  suggest that she start off on one half tablet a day. I will see her back  in followup in about six months, and I have asked her to get lipid and  liver profile in six weeks. She should continue to follow up with Dr.  Loanne Drilling with this specific regard.     [name]. Lia Foyer, MD, [facility]  Electronically Signed    TDS/MedQ  DD: 04/13/2006  DT: 04/14/2006  Job #: [id number]

## 2010-09-20 NOTE — H&P (Signed)
NAME:  [name], UMBARGER               ACCOUNT NO.:  [id number]   MEDICAL RECORD NO.:  [id number]          PATIENT TYPE:  INP   LOCATION:                               FACILITY:  [facility]   PHYSICIAN:  Katrina Yang. Katrina Yang, M.D. [facility] OF BIRTH:  [birth year]   DATE OF ADMISSION:  05/15/2005  DATE OF DISCHARGE:                                HISTORY & PHYSICAL   CHIEF COMPLAINT:  Chest pain and fatigue.   HISTORY OF PRESENT ILLNESS:  [name] is a pleasant 70 year old Caucasian  female who has had a history of some left arm pain, left shoulder pain, left  neck pain and chest discomfort since last Thursday.  The patient states that  she has had some nausea associated with this and some diaphoresis, however,  she has also had diaphoresis and she has been discontinued from her hormone  therapy.  The patient said she had been feeling better in the last few days  but over the weekend felt very fatigued and just did not feel right.  She  came to the office today stating that she continues to have the chest  discomfort.  It seems to be constant and it has woken her up. The patient  states that she spent most of the weekend in a recliner chair.   PAST MEDICAL HISTORY:  1.  Includes a stroke with a mild residual left-sided weakness approximately      3 years ago.  She has some continued pain in her left arm and left leg      discomfort which she has had since her stroke.  The patient states that      in October of 2005, she had been admitted for a trans ischemic attack      with slurred speech and left-sided weakness and clumsiness.  She has      been on aspirin and Plavix for stroke prevention.  The patient was also      admitted 12/08/04 for a trans ischemic attack.  2.  Hypertension.  3.  Chronic back problems and multiple surgeries.   PAST SURGICAL HISTORY:  Multiple back surgeries.   ALLERGIES:  SULFA.  CODEINE.  MEVACOR.  IODINE.  TRIAMINIC/HYDROCHLOROTHIAZIDE.   CURRENT MEDICATIONS:  1.  Norvasc 5 mg p.o. q. day.  2.  Atenolol 25 mg p.o. q. day.  3.  Aspirin 81 mg p.o. q. day.  4.  Synthroid 100 mcg p.o. q. day.  5.  Neurontin 100 mg 2 p.o. q. day.  6.  Plavix 75 mg p.o. q. day.  7.  Folic acid 4 mg p.o. q. day.   REVIEW OF SYSTEMS:  CARDIOVASCULAR:  The patient complains of chest pressure  which she has had since last Thursday off and on.   Fatigue.  Arm and shoulder pain. Occasional nausea.  All other systems  negative except as noted.   SOCIAL HISTORY:  The patient is married and lives with her husband.  She is  employed part time and teaches art at an elementary school.  The patient  denies smoking or drinking.   PHYSICAL EXAMINATION:  GENERAL:  Very pleasant Caucasian lady in no acute  distress.  VITAL SIGNS:  Blood pressure of 130/72, heart rate of 66 and regular.  Her  weight is 133 pounds.  HEENT:  Head is atraumatic. ENT examination is unremarkable.  NECK:  Supple, no lymphadenopathy, no jugular venous distention. No  thyromegaly.  CARDIAC:  Regular rate and rhythm.  No murmurs, rubs or gallops appreciated.  LUNGS:  Clear to auscultation bilaterally.  NEUROLOGICAL:  Patient is alert, awake, oriented x 3.  No aphasia at this  time.  Pupils are equal and reactive.   IMPRESSION/PLAN:  Dr. Lia Yang into evaluate patient. This is a 70 year old  female with a past history of trans ischemic attacks and prior history of  strokes.  The patient does have a familial history of coronary artery  disease in her mother.  Dr. Lia Yang feels the pain may be somewhat atypical,  however regarding her symptoms and her past history, felt the best  evaluation would be to admit the patient and check her cardiac enzymes.  If  cardiac enzymes are negative, planned adenosine Myoview in the a.m.  If  positive, consider a cardiac catheterization.  Patient is currently on  Plavix and beta blocker with aspirin at this point.     ______________________________  Katrina Humphrey,  NP      Katrina Yang. Katrina Yang, M.D. [facility]  Electronically Signed    AH/MEDQ  D:  05/15/2005  T:  05/15/2005  Job:  [id number]

## 2010-09-20 NOTE — Discharge Summary (Signed)
NAME:  Katrina Yang, MOTEN               ACCOUNT NO.:  [id number]   MEDICAL RECORD NO.:  [id number]          PATIENT TYPE:  INP   LOCATION:  3021                         FACILITY:  [facility]   PHYSICIAN:  Dr. Antony Contras       DATE OF BIRTH:  Sep 19, 1940   DATE OF ADMISSION:  12/08/2004  DATE OF DISCHARGE:  12/10/2004                                 DISCHARGE SUMMARY   DISCHARGE DIAGNOSES:  1.  Transient dizziness and lethargy without etiology.  2.  Hypertension.  3.  Spinal cord infarction, old.  4.  Dyslipidemia.  5.  Hypothyroid.  6.  Multiple back surgeries by Dr. Joya Salm and Dr. Durward Mallard.   DISCHARGE MEDICATIONS:  1.  Tenormin 50 mg a day.  2.  Aspirin 81 mg a day.  3.  Plavix 75 mg daily.  4.  Multivitamin 1 a day.  5.  Folic acid 1 mg a day.  6.  Synthroid 100 mcg a day.  7.  Verapamil 240 mg a day.  8.  Premarin 0.45 mg a day.   PROCEDURE:  1.  CT of the brain on admission. Shows no acute intracranial abnormality.  2.  MRI of the brain shows no acute stroke. Mild atrophy and small vessel      disease, which may have progressed in 2003.  3.  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 No significant      atherosclerotic disease. Vertebrobasilar system is widely patent.  4.  MRA of the neck is negative.  5.  Carotid Doppler shows 40% to 60% stenosis by velocity. Plaque formation      does not support the increase. No internal carotid artery stenosis on      the left and vertebral artery flow is antegrade bilaterally.  6.  EEG is normal.  7.  2-D echocardiogram has been completed and results pending.   LABORATORY DATA:  Homocystine remains pending at time discharge. Cholesterol  225, HDL 93, LDL 120, and triglycerides 60. Urine with trace leukocyte  esterase, 0 to 2 white blood cells, many epithelial's and few bacteria.  Urine drug screen is negative. Chemistry is normal except for total protein  of 5.9  and albumin 3.3. Liver function studies are normal. CBC is normal.  Differential with neutrophil of 81. Otherwise normal. Hemoglobin A1C 5.5.   HISTORY OF PRESENT ILLNESS:  Ms. Miani Albach is a 70 year old white female  with history of spinal cord stroke, who had 2 brief episodes of dizziness  and decreased loss of consciousness and gait ataxia on the day prior to  admission and the day of admission. She complained of no dyspnea and  vertigo. She did have weakness and numbness. She has a history of a stroke  in the past x2. Three years ago, she had mild residual left hip paresis with  her last episode in October of 2005. The patient will be admitted by Dr.  Leonie Man for stroke evaluation.   HOSPITAL COURSE:  MRI was unrevealing for  acute infarct. Dr. Leonie Man was  concerned that seizure may play a part in this. However, EEG was negative.  Her transient dizziness and lethargy were without etiology. She had no new  neurologic deficits and the decision was made to discharge her home with  followup by Dr. Erling Cruz.   Of note, she did have elevated LDL and cholesterol in hospital. She has had  elevated levels in the past. She had taken a Statin 1 day prior to her  spinal cord stroke, therefore, Dr. Lia Foyer stopped the Statin. She has been  having it monitored every 6 months by Dr. Loanne Drilling and it has continued to be  decreased with change in diet. Will not start Statin at this time.   CONDITION ON DISCHARGE:  The patient is alert and oriented x3. Speech clear.  No aphagia. Her face is symmetric. Her eye movements are full and she has no  focal deficit. She has no upper extremity drift. She does have some mild  left sided hemiparesis like 4+ over 5 from old stroke. Plantar is down  bilaterally and does have decreased sensation on the left. Her gait is  steady. She has no ataxia.   DISCHARGE PLAN:  1.  Discharge home with husband.  2.  No followup therapy is needed.  3.  Medications as prior to  admission.  4.  Followup with Dr. Erling Cruz in 1 to 2 months.  5.  Followup homocystine.      S   SB/MEDQ  D:  12/10/2004  T:  12/10/2004  Job:  [id number]   cc:   Alyson Locket. Love, M.D.  [address] [place] [address]  Fax: [phone]   Hilliard Clark A. Loanne Drilling, M.D. [facility]   Marcello Moores D. Lia Foyer, M.D. [facility]  [address] [place]  [place] [address]

## 2010-09-20 NOTE — Discharge Summary (Signed)
NAME:  Katrina Yang, Katrina Yang               ACCOUNT NO.:  [id number]   MEDICAL RECORD NO.:  [id number]          PATIENT TYPE:  INP   LOCATION:  3712                         FACILITY:  [place]   PHYSICIAN:  [name]. Lia Foyer, M.D. [facility] OF BIRTH:  [birth year]   DATE OF ADMISSION:  05/15/2005  DATE OF DISCHARGE:  05/16/2005                                 DISCHARGE SUMMARY   DRUG ALLERGIES:  CODEINE, SEPTRA, SULFA, MEVACOR, IODINE, and  HYDROCHLOROTHIAZIDE.   PRINCIPAL DIAGNOSIS:  Chest pain with fatigue.   SECONDARY DIAGNOSES:  1.  Stroke with mild residual left-sided weakness approximately three years      ago.  2.  Hypertension.  3.  Chronic back problems and multiple surgeries.   HISTORY OF PRESENT ILLNESS:  The patient is a 70 year old Caucasian female  who had a history of some left arm pain, left shoulder pain, and some neck  pain that presented to the [facility] office on May 15, 2005.  The patient was seen by Dr. Bing Quarry and determined that her chest  pain needed to be evaluated for questionable coronary artery disease. The  patient was admitted and cardiac enzymes were obtained. Cardiac enzymes were  negative. The patient had an adenosine Myoview completed while inpatient.  The Myoview showed no reversible defects with an ejection fraction of 65%.  The patient again was seen by Dr. Lia Foyer who stated that the patient was  stable to be discharged home. We did make a follow-up appointment with Dr.  Lia Foyer on January 25th at 12:30 p.m.  The patient was discharged home on  the following medications, amlodipine 5 mg p.o. daily, aspirin 81 mg p.o.  daily, atenolol 25 mg b.i.d., Synthroid 80 mcg daily, Neurontin 100 mg p.o.  daily, clopidogrel 75 mg p.o. daily, folic acid 4 mg p.o. daily, Flexeril 5  mg p.r.n., fish oil capsules 1000 mg p.o. daily, and multivitamin p.o.  daily.   DISCHARGE LABORATORY:  White blood cell count of 6.2, hemoglobin 12.7,  hematocrit 37.6.  Sodium 136, potassium 3.9, glucose 139, BUN 27, and  creatinine 1.0. Chest x-ray showed no active cardiopulmonary disease and EKG  showed normal sinus rhythm.   Again, the patient will follow up with Dr. Lia Foyer as previously scheduled.  Total time with MD and NP were less than 30 minutes.     ______________________________  April Humphrey, NP      [name]. Lia Foyer, M.D. [facility]  Electronically Signed    AH/MEDQ  D:  05/28/2005  T:  05/28/2005  Job:  [id number]   cc:   [name]. Lia Foyer, M.D. [facility]  [address] [place]  [place] [address]

## 2010-09-20 NOTE — H&P (Signed)
NAME:  Katrina Yang, Katrina Yang                         ACCOUNT NO.:  [id number]   MEDICAL RECORD NO.:  [id number]                   PATIENT TYPE:  OIB   LOCATION:  NA                                   FACILITY:  [place]   PHYSICIAN:  Zigmund Daniel. Joya Salm, MD               DATE OF BIRTH:  [birth year]   DATE OF ADMISSION:  DATE OF DISCHARGE:                                HISTORY & PHYSICAL   HISTORY OF PRESENT ILLNESS:  Ms. Lopinto is a lady who I have been seeing in  my office because of back and left leg pain associated with numbness  associated with burning and weakness.  This patient had three back surgeries  back in the 1970s.  Right now she is telling me that not only is she having  these problems, but also some tingling sensation in the left hand.  She is  quite miserable.  She is getting worse.  She has had complete workup as she  was sent to [place] with an MRI of the  lumbar spine and brain.  The patient  denies any problems with the right leg, or any problems with the bladder or  bowel.   PAST MEDICAL HISTORY:  Three back surgeries in the 1970s, partial  hysterectomy, and complete hysterectomy in 1983.   ALLERGIES:  SEPTRA and some X-RAY DYE.   SOCIAL HISTORY:  Negative.   FAMILY HISTORY:  Unremarkable.   REVIEW OF SYSTEMS:  Positive high blood pressure, thyroid problems, back  pain, and left leg pain.   PHYSICAL EXAMINATION:  She came to my office with her husband.  She was  limping from the left leg.  Hands are normal.  NECK:  She has full movement of the neck with minimal discomfort.  LUNGS:  Clear.  HEART:  Sounds normal with a normal carotid pulse.  ABDOMEN:  Bowel sounds are normal.  EXTREMITIES:  Strength 5/5 except in the left foot it was 3/5 with weakness  of dorsiflexion, on forward __________ .  There are no other fascicular  symptoms.  The patient complained of burning sensation which involved mostly  the L5 nerve root.  Reflexes 2+ on Babinski.  In the lumbar spine, she  has a  scar from previous surgeries.  She is able to flex, but extension of the  extremities produces some pain.   The MRI showed that this lady has six lumbar vertebrae and she has quite a  bit of degenerative arthropathy at the level of L2 lower level.  There is no  subluxation with flexion and extension.  The MRI showed that in the second  space from below to the right side, there is a large osteophyte with a  recurrent herniated disk.  The L5 nerve root is being compromised at this  level.   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    RECOMMENDATIONS:  The patient wants to proceed with  surgery.  The procedure  will be laminotomy at the 5-6 level on the left side, associated with  probable diskectomy and foramina.  The risks for infection, CSF leak,  worsening pain, paralysis, need for further surgery, no improvement  whatsoever, and all the risks associated with her age.                                                Zigmund Daniel. Joya Salm, MD    EMB/MEDQ  D:  01/04/2002  T:  01/04/2002  Job:  [id number]

## 2010-09-20 NOTE — Letter (Signed)
March 02, 2007    Lucina Mellow. Terance Hart, M.D.  [place]. [address] [place], [place] [address]   RE:  TALEAHA, GROSSO  MRN:  [id number]  /  DOB:  Jul 13, 1940   Dear Earnie Larsson:   I am in receipt of your note from February 23, 2007 regarding Kelise Kopera.  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  You apparently talked to Mrs. Sauve and there was  some question as to whether this was an old finding.  The November 2003  x-ray was read as a stable right apical lung nodule.  Repeat study in  June 2006 was read as focal nodular pleural thickening at the right  apex, which was unchanged.  So, in fact, this may represent a nodule not  previously seen.  Apparently, Dr. Gilford Rile had recommended a followup in  4 months.  A followup I suspect should be done as designated and you  might want to channel this through Dr. Renato Shin.   Thanks so much for sending this.  Best regards.    Sincerely,      [name]. Lia Foyer, MD, [facility]  Electronically Signed    TDS/MedQ  DD: 03/02/2007  DT: 03/03/2007  Job #: [id number]   CC:   [address], [place], [place] [id number] Varney Baas. [place]  Sean A. Loanne Drilling, MD

## 2010-09-20 NOTE — H&P (Signed)
NAME:  Katrina Yang, Katrina Yang               ACCOUNT NO.:  [id number]   MEDICAL RECORD NO.:  [id number]          PATIENT TYPE:  INP   LOCATION:  1826                         FACILITY:  [facility]   PHYSICIAN:  Pramod P. Leonie Man, MD    DATE OF BIRTH:  August 14, 1940   DATE OF ADMISSION:  12/08/2004  DATE OF DISCHARGE:                                HISTORY & PHYSICAL   REFERRING PHYSICIAN:  Mylinda Latina, M.D.   REASON FOR CONSULTATION:  Transient ischemic attack.   HISTORY OF PRESENT ILLNESS:  Katrina Yang is a 70 year old pleasant Caucasian  lady who has had two episodes over the last two days of brief dizziness,  sleepiness, and confusion and decreased responsiveness.  She states she woke  up yesterday, was feeling fine, but then when she got out of bed noticed  that she was dizzy, she describes as feeling off balance.  She denies true  vertigo, nausea, vomiting.  She did not feel right and sat down and felt  tired and sleepy.  Her husband had to wake her up several times.  This  lasted for three or four hours, most of the morning, and then she was fine  by noontime and carried out her usual activities and was fairly active for  the rest of the day.  This morning, when she got out of bed and felt she was  fine, but then complained of dizziness and sat down.  She was probably out  for 1/2 hour, by the time husband came back, she was found to be sleepy.  She was able to arouse, but was mumbling a few words and it took her about a  hour or so to gradually recover back to her baseline after arrival in the  emergency room.  She denied any slurred speech, focal extremity weakness,  numbness.  She could not walk right, and husband had to help her to walk.  There was no tongue biting, tonic-clonic activity noted.  There is no prior  history of seizures, head injury, or loss of consciousness.   PAST MEDICAL HISTORY:  1.  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  2.  She has some  clumsiness in the left hand and some pain in the left leg      for which she takes Neurontin.  3.  In October 2005, she was readmitted with a transient episode of slurred      speech and left-sided weakness and clumsiness.  At that time, MRI scan      did not show a new infarct, and MRA was also unremarkable.  She has been      on aspirin and Plavix for stroke prevention.  She states she otherwise      has been doing well, is fairly active, and has had no recent illnesses.  4.  Hypertension.  5.  Chronic back problems and multiple back surgeries.   After arrival here, she has felt fine without any new complaints.   PAST SURGICAL HISTORY:  Multiple back surgeries.   ALLERGIES:  1.  SULFA.  2.  CODEINE.  MEDICATIONS:  1.  Calan SR 240 mg daily.  2.  Tenormin 50 mg daily.  3.  Premarin 0.45 mg daily.  4.  Synthroid 100 mcg daily.  5.  Aspirin 81 mg daily.  6.  Plavix 75 mg daily.  7.  Multivitamin daily.  8.  Folic acid daily.  9.  Neurontin 300 mg one to two tablets at night p.r.n.   REVIEW OF SYSTEMS:  As stated above.   SOCIAL HISTORY:  The patient is married and lives with her husband.  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  She does not smoke  or drink.   PHYSICAL EXAMINATION:  GENERAL:  This is a pleasant, elderly, Caucasian lady  who is not in distress.  VITAL SIGNS:  She is afebrile.  Pulse rate is 70 per minute and regular,  respiratory rate is 16 per minute, blood pressure is 124/84.  Distal pulses  are well felt.  HEENT:  Head is atraumatic.  ENT examination is unremarkable.  NECK:  Supple, no bruit.  CARDIAC:  No murmurs, rubs, or gallops.  LUNGS:  Clear to auscultation.  NEUROLOGIC:  The patient is pleasant, awake, alert, cooperative.  There is  no aphasia, apraxia, or dysarthria.  Pupils are equal and reactive.  Eye  movements are full without nystagmus.  Face is symmetric.  Palatal movements  are normal.  Tongue is midline.  Motor  system exam reveals no upper  extremity examination drift.  However, fine finger movements are diminished  on the left, __________the right over left upper extremity, left grip is  weak compared to the right.  She has good strength of the left shoulder,  elbow and wrist.  Lower extremity examination reveals mild weakness of left  hip flexors and ankle dorsiflexors.  Tone is slightly increased on the left  compared to the right.  She has no sensory loss in the upper extremities,  but has mild decrease in sensation of the left leg throughout compared to  the right.  Deep tendon reflexes were 2+ and symmetric.  Toes are downgoing.  Coordination is slightly slow on the left compared to the right.  Gait not  tested.   ADMISSION LABORATORY DATA:  Normal electrolytes, WBC count.  MRI scan of the  brain done on February 13, 2004, shows mild atheromatous changes in the right  M1 middle cerebral artery, as well as horizontal segment of left vertebral  artery, but probably only less than 50% stenosis.  Brain MRI scan does not  show significant infarct.  CT scan of the head done today reveals no acute  abnormality.   IMPRESSION:  A 70 year old lady with two episodes of brief dizziness, gait  ataxia, and decreased level of consciousness of unclear etiology.  Vertebral  basilar ischemia is certainly a consideration given her prior history of  strokes and transient ischemic attacks.  Seizures are less likely.  I doubt  this is syncopal event secondary to bradycardia or hypertension.   PLAN:  I had a long discussion with the patient and her husband regarding  her symptoms.  Discussed plan, evaluation, and treatment and answered  questions.  I would recommend admitting the patient to observation and  monitoring cardiac rhythm on telemetry, obtaining a MRI scan of the brain  with MRA of the brain and neck to look for any interval changes since the last scan. Also, EEG to evaluate for  seizure activity.   Intravenous hydration.  If the patient  does have evidence  of cerebrovascular ischemia, may consider changing aspirin and Plavix to  Aggrenox, or if the patient has __________ treatable lesion, may considered  that.       PPS/MEDQ  D:  12/08/2004  T:  12/08/2004  Job:  [id number]   cc:   Alyson Locket. Love, M.D.  [address] [place] [address]  Fax: [phone]   Hilliard Clark A. Loanne Drilling, M.D. [facility]

## 2010-09-20 NOTE — Op Note (Signed)
   NAME:  Katrina Yang, Katrina Yang                         ACCOUNT NO.:  [id number]   MEDICAL RECORD NO.:  [id number]                   PATIENT TYPE:  OIB   LOCATION:  3014                                 FACILITY:  [facility]   PHYSICIAN:  Zigmund Daniel. Joya Salm, MD               DATE OF BIRTH:  [birth year]   DATE OF PROCEDURE:  01/04/2002  DATE OF DISCHARGE:                                 OPERATIVE REPORT   PREOPERATIVE DIAGNOSES:  Left L5-S1 herniated disk.   POSTOPERATIVE DIAGNOSES:  Left L5-S1 herniated disk.   PROCEDURE:  Left L5-S1 diskectomy, foraminotomy, lysis of adhesion,  microscope.   HISTORY:  This patient was a lady who underwent three back surgeries back in  the mid-70s because of her herniated disks.  She came in now complaining of  left back and left leg pain associated with weakness and numbness.  An MRI  showed degenerative disk disease plus scar tissue, and the possibility of  herniated disk at the takeoff of the L5 nerve root.  The patient has had  __________ of L5-S1.  The risks were explained in the history and physical.   PROCEDURE:  The patient was taken to the OR, and she was placed on the table  and monitored.  The back was prepped with Betadine.  A midline incision  resecting the previous scar was made.  We localized thoroughly one-fifth of  the previous scar tissue.  Dissection was carried out laterally to L5-S1.  We got the microscope into the area.  We carried out lysis of adhesions.  We  were able to visualize the lower level of L5 and the upper of S1.  With the  drill as well as the 2 and 3 mm Kerrison punch, an annulotomy was  accomplished.  Lysis of adhesions was already achieved.  The S1 nerve root  was almost glued to the floor of the spine.  Having retraction, we entered  the disk space.  There was a fragment, prior to takeoff of S1 nerve root.  Total diskectomy was accomplished.  Again, we had plenty of room for the L5  as well as the S1 nerve root.  Having  done this, Valsalva maneuver was made  __________ and the wound was closed with Steri-Strips.                                               Zigmund Daniel. Joya Salm, MD    EMB/MEDQ  D:  01/04/2002  T:  01/05/2002  Job:  [id number]

## 2010-10-01 ENCOUNTER — Telehealth: Payer: Self-pay

## 2010-10-01 DIAGNOSIS — J329 Chronic sinusitis, unspecified: Secondary | ICD-10-CM | POA: Insufficient documentation

## 2010-10-01 NOTE — Telephone Encounter (Signed)
Pt advised.

## 2010-10-01 NOTE — Telephone Encounter (Signed)
done

## 2010-10-01 NOTE — Telephone Encounter (Signed)
Pt called requesting referrals to ENT for recurrent sinus infections.

## 2010-10-13 ENCOUNTER — Other Ambulatory Visit: Payer: Self-pay | Admitting: Endocrinology

## 2010-11-02 ENCOUNTER — Other Ambulatory Visit: Payer: Self-pay | Admitting: Cardiology

## 2011-01-03 ENCOUNTER — Other Ambulatory Visit (INDEPENDENT_AMBULATORY_CARE_PROVIDER_SITE_OTHER): Payer: Medicare Other

## 2011-01-03 ENCOUNTER — Ambulatory Visit (INDEPENDENT_AMBULATORY_CARE_PROVIDER_SITE_OTHER)
Admission: RE | Admit: 2011-01-03 | Discharge: 2011-01-03 | Disposition: A | Discharge status: Home / Self Care | Payer: Medicare Other | Source: Ambulatory Visit | Attending: Endocrinology | Admitting: Endocrinology

## 2011-01-03 ENCOUNTER — Encounter: Payer: Self-pay | Admitting: Endocrinology

## 2011-01-03 ENCOUNTER — Other Ambulatory Visit: Payer: Self-pay | Admitting: Endocrinology

## 2011-01-03 ENCOUNTER — Ambulatory Visit (INDEPENDENT_AMBULATORY_CARE_PROVIDER_SITE_OTHER): Payer: Medicare Other | Admitting: Endocrinology

## 2011-01-03 DIAGNOSIS — E7800 Pure hypercholesterolemia, unspecified: Secondary | ICD-10-CM

## 2011-01-03 DIAGNOSIS — E039 Hypothyroidism, unspecified: Secondary | ICD-10-CM

## 2011-01-03 DIAGNOSIS — R059 Cough, unspecified: Secondary | ICD-10-CM

## 2011-01-03 DIAGNOSIS — E785 Hyperlipidemia, unspecified: Secondary | ICD-10-CM

## 2011-01-03 DIAGNOSIS — I10 Essential (primary) hypertension: Secondary | ICD-10-CM

## 2011-01-03 DIAGNOSIS — R05 Cough: Secondary | ICD-10-CM

## 2011-01-03 DIAGNOSIS — Z79899 Other long term (current) drug therapy: Secondary | ICD-10-CM

## 2011-01-03 DIAGNOSIS — Z0000 Encounter for general adult medical examination without abnormal findings: Secondary | ICD-10-CM

## 2011-01-03 LAB — CBC WITH DIFFERENTIAL/PLATELET
Eosinophils Relative: 5.6 % — ABNORMAL HIGH (ref 0.0–5.0)
HCT: 38.8 % (ref 36.0–46.0)
Hemoglobin: 13 g/dL (ref 12.0–15.0)
Lymphs Abs: 1.4 10*3/uL (ref 0.7–4.0)
MCV: 91.8 fl (ref 78.0–100.0)
Monocytes Absolute: 0.5 10*3/uL (ref 0.1–1.0)
Monocytes Relative: 8.3 % (ref 3.0–12.0)
Neutro Abs: 3.5 10*3/uL (ref 1.4–7.7)
WBC: 5.8 10*3/uL (ref 4.5–10.5)

## 2011-01-03 LAB — LIPID PANEL
Cholesterol: 275 mg/dL — ABNORMAL HIGH (ref 0–200)
HDL: 91.4 mg/dL (ref >39.00)
Triglycerides: 68 mg/dL (ref 0.0–149.0)

## 2011-01-03 LAB — BASIC METABOLIC PANEL
BUN: 31 mg/dL — ABNORMAL HIGH (ref 6–23)
Creatinine, Ser: 1.6 mg/dL — ABNORMAL HIGH (ref 0.4–1.2)
GFR: 35.2 mL/min — ABNORMAL LOW (ref >60.00)
Glucose, Bld: 90 mg/dL (ref 70–99)
Potassium: 4.3 mEq/L (ref 3.5–5.1)

## 2011-01-03 LAB — URINALYSIS, ROUTINE W REFLEX MICROSCOPIC
Bilirubin Urine: NEGATIVE
Hgb urine dipstick: NEGATIVE
Ketones, ur: NEGATIVE
Total Protein, Urine: NEGATIVE
Urine Glucose: NEGATIVE
pH: 6 (ref 5.0–8.0)

## 2011-01-03 LAB — TSH: TSH: 1.09 u[IU]/mL (ref 0.35–5.50)

## 2011-01-03 LAB — HEPATIC FUNCTION PANEL
AST: 35 U/L (ref 0–37)
Albumin: 3.9 g/dL (ref 3.5–5.2)
Alkaline Phosphatase: 66 U/L (ref 39–117)
Total Bilirubin: 0.6 mg/dL (ref 0.3–1.2)

## 2011-01-03 NOTE — Progress Notes (Signed)
Subjective:    Patient ID: SHAELI ANGELICA, female    DOB: [birth year], 70 y.o.   MRN: [id number]  HPI Subjective:   Patient here for Medicare annual wellness visit and management of other chronic and acute problems.     Risk factors: advanced age    59 of Physicians Providing Medical Care to Patient: Neurol: love Cardiol: stuckey Opthal: Cosby opthal Gyn: bowie Ent: gso ent Derm: kimmel Therapist, art)   Activities of Daily Living: In your present state of health, do you have any difficulty performing the following activities?:  Preparing food and eating?: No  Bathing yourself: No  Getting dressed: No  Using the toilet:No  Moving around from place to place: No  In the past year have you fallen or had a near fall?: No    Home Safety: Has smoke detector and wears seat belts. Firearms are safely stored. No excess sun exposure.  Diet and Exercise  Current exercise habits:  Pt says very good Dietary issues discussed: pt states she eats a healthy diet   Depression Screen  Q1: Over the past two weeks, have you felt down, depressed or hopeless? no  Q2: Over the past two weeks, have you felt little interest or pleasure in doing things? no   The following portions of the patient's history were reviewed and updated as appropriate: allergies, current medications, past family history, past medical history, past social history, past surgical history and problem list.  Past Medical History  Diagnosis Date  . Hypertension   . Palpitations   . Pure hypercholesterolemia   . CVA (cerebral vascular accident)     hx of  . Secondary hyperparathyroidism (of renal origin)   . Pulmonary nodule   . GERD (gastroesophageal reflux disease)   . Diverticulosis of colon (without mention of hemorrhage)   . Colonic polyp     hx of  . Degenerative joint disease   . Personal history of unspecified urinary disorder   . Osteoporosis, unspecified   . Unspecified hypothyroidism   . History of  hysterectomy     Past Surgical History  Procedure Date  . Back surgery     diskectomy L-5. Multiple back surgeries  . Abdominal hysterectomy     History   Social History  . Marital Status: Married    Spouse Name: N/A    Number of Children: 1  . 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 Smoking status: Never Smoker   . Smokeless tobacco: Not on file  . Alcohol Use: No  . Drug Use: No  . Sexually Active: Not on file   Other Topics Concern  . Not on file   Social History Narrative  . No narrative on file    Current Outpatient Prescriptions on File Prior to Visit  Medication Sig Dispense Refill  . amLODipine (NORVASC) 10 MG tablet TAKE 1 TABLET DAILY  90 tablet  3  . aspirin 81 MG tablet Take 81 mg by mouth daily.        . calcitRIOL (ROCALTROL) 0.25 MCG capsule TAKE 1 CAPSULE DAILY  90 capsule  2  . Cholecalciferol (VITAMIN D) 1000 UNITS capsule Take 1,000 Units by mouth daily.        . clopidogrel (PLAVIX) 75 MG tablet Take 75 mg by mouth daily.        Marland Kitchen gabapentin (NEURONTIN) 300 MG capsule Take 300 mg by mouth 2 (two) times daily.        Marland Kitchen  Levothyroxine Sodium 88 MCG CAPS Take 1 capsule by mouth daily.        . metoprolol (TOPROL-XL) 50 MG 24 hr tablet 50 mg. 1/2 in morning 1/2 in the evening      . Multiple Vitamin (MULTIVITAMIN) tablet Take 1 tablet by mouth daily.        . Omega-3 Fatty Acids (FISH OIL) 1200 MG CAPS Take 2 capsules by mouth 2 (two) times daily.         Allergies  Allergen Reactions  . Codeine   . Iodine   . Iohexol   . Lovastatin   . Metronidazole     REACTION: Reaction not known  . Sulfamethoxazole W/Trimethoprim   . Sulfonamide Derivatives     Family History  Problem Relation Age of Onset  . Kidney cancer Father   . Heart attack Father   . Colon cancer Mother   . Ovarian cancer Mother   . Heart disease Mother   . Heart disease Father     BP 132/82  Pulse 54  Temp(Src)  97.1 F (36.2 C) (Oral)  Ht 5\' 4"  (1.626 m)  Wt 129 lb (58.514 kg)  BMI 22.14 kg/m2  SpO2 94%   Review of Systems  Denies hearing loss, and visual loss Objective:   Vision:  Sees opthalmologist Hearing: grossly normal Body mass index:  See vs page Msk: pt easily and quickly performs "get-up-and-go" from a sitting position. Cognitive Impairment Assessment: cognition, memory and judgment appear normal.  remembers 3/3 at 5 minutes.  excellent recall.  can easily read and write a sentence.  alert and oriented x 3   Assessment:   Medicare wellness utd on preventive parameters    Plan:   During the course of the visit the patient was educated and counseled about appropriate screening and preventive services including:        Fall prevention   Screening mammography--dr bowie does Bone densitometry screening is up to date Diabetes screening  Nutrition counseling   Vaccines / LABS Zostavax--pt says she had a few years ago   Patient Instructions (the written plan) was given to the patient.        Review of Systems     Objective:   Physical Exam        Assessment & Plan:    SEPARATE EVALUATION FOLLOWS: HISTORY OF THE PRESENT ILLNESS: Pt stopped mevacor, due to cva Pt states 1 years of slight cough in the chest, in the context of eating.  No assoc dysphagia PAST MEDICAL HISTORY reviewed and up to date today REVIEW OF SYSTEMS: denies chest pain and sob. PHYSICAL EXAMINATION: VITAL SIGNS:  See vs page GENERAL: no distress LUNGS:  Clear to auscultation, except for few rales at both bases LAB/XRAY RESULTS: Lab Results  Component Value Date   CHOL 275* 01/03/2011   CHOL 304* 12/04/2009   CHOL  Value: 221        ATP III CLASSIFICATION:  <200     mg/dL   Desirable  200-239  mg/dL   Borderline High  >=240    mg/dL   High       * 07/09/2009   Lab Results  Component Value Date   HDL 91.40 01/03/2011   HDL 96.20 12/04/2009   HDL 77 07/09/2009   Lab Results  Component  Value Date   LDLCALC  Value: 122        Total Cholesterol/HDL:CHD Risk Coronary Heart Disease Risk Table  Men   Women  1/2 Average Risk   3.4   3.3  Average Risk       5.0   4.4  2 X Average Risk   9.6   7.1  3 X Average Risk  23.4   11.0        Use the calculated Patient Ratio above and the CHD Risk Table to determine the patient's CHD Risk.        ATP III CLASSIFICATION (LDL):  <100     mg/dL   Optimal  100-129  mg/dL   Near or Above                    Optimal  130-159  mg/dL   Borderline  160-189  mg/dL   High  >190     mg/dL   Very High* 07/09/2009   Lab Results  Component Value Date   TRIG 68.0 01/03/2011   TRIG 78.0 12/04/2009   TRIG 108 07/09/2009   Lab Results  Component Value Date   CHOLHDL 3 01/03/2011   CHOLHDL 3 12/04/2009   CHOLHDL 2.9 07/09/2009   Lab Results  Component Value Date   LDLDIRECT 164.5 01/03/2011   LDLDIRECT 186.7 12/04/2009   LDLDIRECT 138.7 12/25/2008  Cxr: nad IMPRESSION: Dyslipidemia, therapy is limited by perceived drug intolerance Cough, uncertain etiology.  ? Due to esophageal dysmotility Htn, well-controlled PLAN: See instruction page

## 2011-01-03 NOTE — Patient Instructions (Addendum)
blood tests, and a chest x-ray, are being requested for you today.  please call [phone] to hear your test results.  You will be prompted to enter the 9-digit "MRN" number that appears at the top left of this page, followed by #.  Then you will hear the message. please consider these measures for your health:  minimize alcohol.  do not use tobacco products.  have a colonoscopy at least every 10 years from age 69.  Women should have an annual mammogram from age 55.  keep firearms safely stored.  always use seat belts.  have working smoke alarms in your home.  see an eye doctor and dentist regularly.  never drive under the influence of alcohol or drugs (including prescription drugs).  those with fair skin should take precautions against the sun. please let me know what your wishes would be, if artificial life support measures should become necessary.  it is critically important to prevent falling down (keep floor areas well-lit, dry, and free of loose objects). (update: we discussed code status.  pt requests full code, but would not want to be started or maintained on artificial life-support measures if there was not a reasonable chance of recovery). (update: i left message on phone-tree:  You should take mevacor)

## 2011-01-23 ENCOUNTER — Ambulatory Visit (INDEPENDENT_AMBULATORY_CARE_PROVIDER_SITE_OTHER): Payer: Medicare Other | Admitting: Cardiology

## 2011-01-23 DIAGNOSIS — I10 Essential (primary) hypertension: Secondary | ICD-10-CM

## 2011-01-23 DIAGNOSIS — E7800 Pure hypercholesterolemia, unspecified: Secondary | ICD-10-CM

## 2011-01-23 NOTE — Progress Notes (Signed)
HPI:  She is doing well.  No chest pain.   Noted to have mild renal dysfunction when she went for MRI.  No symptoms.  Has had HTN for some time, and also has a history of hypercholesterolemia, with a strong family history of CAD  (mother was a patient of mine).  Otherwise doing well, no major symptoms.  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  BP has been pretty well controlled.   Current Outpatient Prescriptions  Medication Sig Dispense Refill  . amLODipine (NORVASC) 10 MG tablet TAKE 1 TABLET DAILY  90 tablet  3  . aspirin 81 MG tablet Take 81 mg by mouth daily.        . calcitRIOL (ROCALTROL) 0.25 MCG capsule TAKE 1 CAPSULE DAILY  90 capsule  2  . Cholecalciferol (VITAMIN D) 1000 UNITS capsule Take 1,000 Units by mouth daily.        . clopidogrel (PLAVIX) 75 MG tablet Take 75 mg by mouth daily.        Marland Kitchen gabapentin (NEURONTIN) 300 MG capsule Take 300 mg by mouth 2 (two) times daily.        . Levothyroxine Sodium 88 MCG CAPS Take 1 capsule by mouth daily.        . metoprolol (TOPROL-XL) 50 MG 24 hr tablet 50 mg. 1/2 in morning 1/2 in the evening      . Multiple Vitamin (MULTIVITAMIN) tablet Take 1 tablet by mouth daily.        . Omega-3 Fatty Acids (FISH OIL) 1200 MG CAPS Take 2 capsules by mouth 2 (two) times daily.         Allergies  Allergen Reactions  . Codeine   . Iodine   . Iohexol   . Lovastatin   . Metronidazole     REACTION: Reaction not known  . Sulfamethoxazole W/Trimethoprim   . Sulfonamide Derivatives     Past Medical History  Diagnosis Date  . Hypertension   . Palpitations   . Pure hypercholesterolemia   . CVA (cerebral vascular accident)     hx of  . Secondary hyperparathyroidism (of renal origin)   . Pulmonary nodule   . GERD (gastroesophageal reflux disease)   . Diverticulosis of colon (without mention of hemorrhage)   . Colonic polyp     hx of  . Degenerative joint disease   . Personal history of unspecified urinary disorder   . Osteoporosis, unspecified   .  Unspecified hypothyroidism   . History of hysterectomy     Past Surgical History  Procedure Date  . Back surgery     diskectomy L-5. Multiple back surgeries  . Abdominal hysterectomy     Family History  Problem Relation Age of Onset  . Kidney cancer Father   . Heart attack Father   . Colon cancer Mother   . Ovarian cancer Mother   . Heart disease Mother   . Heart disease Father     History   Social History  . Marital Status: Married    Spouse Name: N/A    Number of Children: 1  . 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 Smoking status: Never Smoker   . Smokeless tobacco: Not on file  . Alcohol Use: No  . Drug Use: No  . Sexually Active: Not on file   Other Topics Concern  . Not on file   Social History Narrative  . No narrative on  file    ROS: Please see the HPI.  All other systems reviewed and negative.  PHYSICAL EXAM:  BP 136/78  Pulse 56  Ht 5' 3.5" (1.613 m)  Wt 132 lb 12.8 oz (60.238 kg)  BMI 23.16 kg/m2  General: Well developed, well nourished, in no acute distress. Head:  Normocephalic and atraumatic. Neck: no JVD.  No carotid bruit.  Lungs: Clear to auscultation and percussion. Heart: Normal S1 and S2.  No murmur, rubs or gallops.  Abdomen:  Normal bowel sounds; soft; non tender; no organomegaly.  No vascular bruit on careful examination.   Pulses: Pulses normal in all 4 extremities. Extremities: No clubbing or cyanosis. No edema. Neurologic: Alert and oriented x 3.  EKG:  NSR.  Rightward axis deviation. ASSESSMENT AND PLAN:

## 2011-01-23 NOTE — Patient Instructions (Signed)
Your physician wants you to follow-up in: 1 YEAR.  You will receive a reminder letter in the mail two months in advance. If you don't receive a letter, please call our office to schedule the follow-up appointment.  Your physician recommends that you continue on your current medications as directed. Please refer to the Current Medication list given to you today.  Your physician has requested that you have a renal artery duplex. During this test, an ultrasound is used to evaluate blood flow to the kidneys. Allow one hour for this exam. Do not eat after midnight the day before and avoid carbonated beverages. Take your medications as you usually do.

## 2011-01-23 NOTE — Assessment & Plan Note (Signed)
Followed by Dr. Loanne Drilling.  Will defer treatment to him.

## 2011-01-23 NOTE — Assessment & Plan Note (Signed)
Patient is well controlled.  Not on diuretic.  Schedule to see Urology, and has had a bladder issue.  No obvious vascular bruit.  Given findings, will get renal artery ultrasound.  She has family history of vascular disease.  Long standing hypertension.  Can rule out hydronephrosis, vascular source.  Since not on meds, this does not explain.  Renal size will also be helpful.

## 2011-02-24 ENCOUNTER — Other Ambulatory Visit: Payer: Self-pay | Admitting: Cardiology

## 2011-02-24 ENCOUNTER — Encounter (INDEPENDENT_AMBULATORY_CARE_PROVIDER_SITE_OTHER): Payer: Medicare Other | Admitting: Cardiology

## 2011-02-24 DIAGNOSIS — I700 Atherosclerosis of aorta: Secondary | ICD-10-CM

## 2011-02-24 DIAGNOSIS — E7800 Pure hypercholesterolemia, unspecified: Secondary | ICD-10-CM

## 2011-02-24 DIAGNOSIS — I10 Essential (primary) hypertension: Secondary | ICD-10-CM

## 2011-02-24 DIAGNOSIS — N189 Chronic kidney disease, unspecified: Secondary | ICD-10-CM

## 2011-02-28 ENCOUNTER — Telehealth: Payer: Self-pay | Admitting: Cardiology

## 2011-02-28 NOTE — Telephone Encounter (Signed)
New message:  Please make sure that the ultrasound from Monday is faxed to [facility].  She has an appt. On Monday.

## 2011-02-28 NOTE — Telephone Encounter (Signed)
U/S results were found and faxed to [facility].

## 2011-03-04 ENCOUNTER — Encounter: Payer: Self-pay | Admitting: Endocrinology

## 2011-03-04 ENCOUNTER — Ambulatory Visit (INDEPENDENT_AMBULATORY_CARE_PROVIDER_SITE_OTHER): Payer: Medicare Other | Admitting: Endocrinology

## 2011-03-04 VITALS — BP 152/88 | HR 73 | Temp 98.6°F | Resp 16 | Wt 131.2 lb

## 2011-03-04 DIAGNOSIS — N289 Disorder of kidney and ureter, unspecified: Secondary | ICD-10-CM | POA: Insufficient documentation

## 2011-03-04 NOTE — Patient Instructions (Addendum)
The best things you can do for your kidneys are to lower your blood pressure and lower your cholesterol.   Refer to a kidney specialist.  you will receive a phone call, about a day and time for an appointment

## 2011-03-07 ENCOUNTER — Telehealth: Payer: Self-pay | Admitting: Cardiology

## 2011-03-07 NOTE — Telephone Encounter (Signed)
Pt calling to leave a message for lauren, she knows she's not here today, re her BP being high, and had an [place] that showed a kidney problem

## 2011-03-08 NOTE — Progress Notes (Signed)
Subjective:    Patient ID: Katrina Yang, female    DOB: 1940/11/22, 70 y.o.   MRN: [id number]  HPI Pt comes in today, to discuss the progression of her creat she noted on her copy of labs Past Medical History  Diagnosis Date  . Hypertension   . Palpitations   . Pure hypercholesterolemia   . CVA (cerebral vascular accident)     hx of  . Secondary hyperparathyroidism (of renal origin)   . Pulmonary nodule   . GERD (gastroesophageal reflux disease)   . Diverticulosis of colon (without mention of hemorrhage)   . Colonic polyp     hx of  . Degenerative joint disease   . Personal history of unspecified urinary disorder   . Osteoporosis, unspecified   . Unspecified hypothyroidism   . History of hysterectomy     Past Surgical History  Procedure Date  . Back surgery     diskectomy L-5. Multiple back surgeries  . Abdominal hysterectomy     History   Social History  . Marital Status: Married    Spouse Name: N/A    Number of Children: 1  . 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 Smoking status: Never Smoker   . Smokeless tobacco: Not on file  . Alcohol Use: No  . Drug Use: No  . Sexually Active: Not on file   Other Topics Concern  . Not on file   Social History Narrative  . No narrative on file    Current Outpatient Prescriptions on File Prior to Visit  Medication Sig Dispense Refill  . amLODipine (NORVASC) 10 MG tablet TAKE 1 TABLET DAILY  90 tablet  3  . aspirin 81 MG tablet Take 81 mg by mouth daily.        . calcitRIOL (ROCALTROL) 0.25 MCG capsule TAKE 1 CAPSULE DAILY  90 capsule  2  . Cholecalciferol (VITAMIN D) 1000 UNITS capsule Take 1,000 Units by mouth daily.        . clopidogrel (PLAVIX) 75 MG tablet Take 75 mg by mouth daily.        Marland Kitchen gabapentin (NEURONTIN) 300 MG capsule Take 300 mg by mouth 2 (two) times daily.        . Levothyroxine Sodium 88 MCG CAPS Take 1 capsule by mouth daily.        .  metoprolol (TOPROL-XL) 50 MG 24 hr tablet TAKE 1 TABLET DAILY  90 tablet  3  . Multiple Vitamin (MULTIVITAMIN) tablet Take 1 tablet by mouth daily.        . Omega-3 Fatty Acids (FISH OIL) 1200 MG CAPS Take 2 capsules by mouth 2 (two) times daily.         Allergies  Allergen Reactions  . Codeine   . Iodine   . Iohexol   . Lovastatin   . Metronidazole     REACTION: Reaction not known  . Sulfamethoxazole W/Trimethoprim   . Sulfonamide Derivatives     Family History  Problem Relation Age of Onset  . Kidney cancer Father   . Heart attack Father   . Colon cancer Mother   . Ovarian cancer Mother   . Heart disease Mother   . Heart disease Father     BP 152/88  Pulse 73  Temp(Src) 98.6 F (37 C) (Oral)  Resp 16  Wt 131 lb 4 oz (59.535 kg)  SpO2 96%    Review  of Systems No urinary sxs    Objective:   Physical Exam VITAL SIGNS:  See vs page GENERAL: no distress Ext: no edema   Lab Results  Component Value Date   WBC 5.8 01/03/2011   HGB 13.0 01/03/2011   HCT 38.8 01/03/2011   PLT 229.0 01/03/2011   GLUCOSE 90 01/03/2011   CHOL 275* 01/03/2011   TRIG 68.0 01/03/2011   HDL 91.40 01/03/2011   LDLDIRECT 164.5 01/03/2011   LDLCALC  Value: 122        Total Cholesterol/HDL:CHD Risk Coronary Heart Disease Risk Table                     Men   Women  1/2 Average Risk   3.4   3.3  Average Risk       5.0   4.4  2 X Average Risk   9.6   7.1  3 X Average Risk  23.4   11.0        Use the calculated Patient Ratio above and the CHD Risk Table to determine the patient's CHD Risk.        ATP III CLASSIFICATION (LDL):  <100     mg/dL   Optimal  100-129  mg/dL   Near or Above                    Optimal  130-159  mg/dL   Borderline  160-189  mg/dL   High  >190     mg/dL   Very High* 07/09/2009   ALT 22 01/03/2011   AST 35 01/03/2011   NA 142 01/03/2011   K 4.3 01/03/2011   CL 106 01/03/2011   CREATININE 1.6* 01/03/2011   BUN 31* 01/03/2011   CO2 30 01/03/2011   TSH 1.09 01/03/2011   INR 0.98 07/09/2009        Assessment & Plan:  Mild renal insuff, slightly worse Dyslipidemia.  This could accelerate renal dz

## 2011-03-13 NOTE — Telephone Encounter (Signed)
Left message for pt to call back  °

## 2011-03-13 NOTE — Telephone Encounter (Signed)
I spoke with the pt and made her aware of renal duplex results-normal renal arteries.  The pt said that she saw Dr Loanne Drilling and he is referring her to [facility] due to increased renal function.  The pt is currently waiting for this appointment to be scheduled.

## 2011-04-04 ENCOUNTER — Ambulatory Visit (INDEPENDENT_AMBULATORY_CARE_PROVIDER_SITE_OTHER): Payer: Medicare Other | Admitting: Endocrinology

## 2011-04-04 ENCOUNTER — Ambulatory Visit: Payer: Medicare Other | Admitting: Endocrinology

## 2011-04-04 ENCOUNTER — Ambulatory Visit (INDEPENDENT_AMBULATORY_CARE_PROVIDER_SITE_OTHER)
Admission: RE | Admit: 2011-04-04 | Discharge: 2011-04-04 | Disposition: A | Discharge status: Home / Self Care | Payer: Medicare Other | Source: Ambulatory Visit | Attending: Endocrinology | Admitting: Endocrinology

## 2011-04-04 ENCOUNTER — Encounter: Payer: Self-pay | Admitting: Endocrinology

## 2011-04-04 VITALS — BP 138/64 | HR 71 | Temp 98.7°F | Ht 63.0 in | Wt 130.1 lb

## 2011-04-04 DIAGNOSIS — J189 Pneumonia, unspecified organism: Secondary | ICD-10-CM | POA: Insufficient documentation

## 2011-04-04 MED ORDER — CEFUROXIME AXETIL 500 MG PO TABS: 500.0000 mg | ORAL_TABLET | Freq: Two times a day (BID) | ORAL | Status: AC

## 2011-04-04 NOTE — Progress Notes (Signed)
Subjective:    Patient ID: Katrina Yang, female    DOB: [birth year], 70 y.o.   MRN: [id number]  HPI Pt reports few weeks of prod-quality cough in the chest, and assoc pain at the left chest wall.  She was seen at urgent care 13 days ago, and was rx'ed levaquin.  She feels somewhat better.   Past Medical History  Diagnosis Date  . Hypertension   . Palpitations   . Pure hypercholesterolemia   . CVA (cerebral vascular accident)     hx of  . Secondary hyperparathyroidism (of renal origin)   . Pulmonary nodule   . GERD (gastroesophageal reflux disease)   . Diverticulosis of colon (without mention of hemorrhage)   . Colonic polyp     hx of  . Degenerative joint disease   . Personal history of unspecified urinary disorder   . Osteoporosis, unspecified   . Unspecified hypothyroidism   . History of hysterectomy     Past Surgical History  Procedure Date  . Back surgery     diskectomy L-5. Multiple back surgeries  . Abdominal hysterectomy     History   Social History  . Marital Status: Married    Spouse Name: N/A    Number of Children: 1  . 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 Smoking status: Never Smoker   . Smokeless tobacco: Not on file  . Alcohol Use: No  . Drug Use: No  . Sexually Active: Not on file   Other Topics Concern  . Not on file   Social History Narrative  . No narrative on file    Current Outpatient Prescriptions on File Prior to Visit  Medication Sig Dispense Refill  . amLODipine (NORVASC) 10 MG tablet TAKE 1 TABLET DAILY  90 tablet  3  . aspirin 81 MG tablet Take 81 mg by mouth daily.        . calcitRIOL (ROCALTROL) 0.25 MCG capsule TAKE 1 CAPSULE DAILY  90 capsule  2  . Cholecalciferol (VITAMIN D) 1000 UNITS capsule Take 1,000 Units by mouth daily.        . clopidogrel (PLAVIX) 75 MG tablet Take 75 mg by mouth daily.        Marland Kitchen gabapentin (NEURONTIN) 300 MG capsule Take 300 mg by mouth 2  (two) times daily.        . Levothyroxine Sodium 88 MCG CAPS Take 1 capsule by mouth daily.        . metoprolol (TOPROL-XL) 50 MG 24 hr tablet TAKE 1 TABLET DAILY  90 tablet  3  . Multiple Vitamin (MULTIVITAMIN) tablet Take 1 tablet by mouth daily.        . Omega-3 Fatty Acids (FISH OIL) 1200 MG CAPS Take 2 capsules by mouth 2 (two) times daily.         Allergies  Allergen Reactions  . Codeine   . Iodine   . Iohexol   . Lovastatin   . Metronidazole     REACTION: Reaction not known  . Sulfamethoxazole W/Trimethoprim   . Sulfonamide Derivatives     Family History  Problem Relation Age of Onset  . Kidney cancer Father   . Heart attack Father   . Colon cancer Mother   . Ovarian cancer Mother   . Heart disease Mother   . Heart disease Father     BP 138/64  Pulse 71  Temp(Src) 98.7 F (  37.1 C) (Oral)  Ht 5\' 3"  (1.6 m)  Wt 130 lb 1.9 oz (59.022 kg)  BMI 23.05 kg/m2  SpO2 97%  Review of Systems Denies fever    Objective:   Physical Exam VITAL SIGNS:  See vs page GENERAL: no distress LUNGS:  Clear to auscultation, except for a few rale at the left base.   Cxr: nad    Assessment & Plan:  Pneumonia, improved

## 2011-04-04 NOTE — Patient Instructions (Addendum)
Let's recheck your chest-x-ray.  Then please call [phone] to hear your test results.  You will be prompted to enter the 9-digit "MRN" number that appears at the top left of this page, followed by #.  Then you will hear the message. i have sent a prescription to your pharmacy, for a different antibiotic.  I hope you feel better soon.  If you don't feel better soon, please call back.   (update: i left message on phone-tree:  rx as we discussed)

## 2011-04-18 ENCOUNTER — Other Ambulatory Visit: Payer: Self-pay | Admitting: Endocrinology

## 2011-06-18 ENCOUNTER — Other Ambulatory Visit: Payer: Self-pay | Admitting: Endocrinology

## 2011-06-19 ENCOUNTER — Encounter: Payer: Self-pay | Admitting: Cardiology

## 2011-06-19 DIAGNOSIS — D649 Anemia, unspecified: Secondary | ICD-10-CM | POA: Diagnosis not present

## 2011-06-19 DIAGNOSIS — N183 Chronic kidney disease, stage 3 unspecified: Secondary | ICD-10-CM | POA: Diagnosis not present

## 2011-06-19 DIAGNOSIS — N2581 Secondary hyperparathyroidism of renal origin: Secondary | ICD-10-CM | POA: Diagnosis not present

## 2011-06-19 DIAGNOSIS — N390 Urinary tract infection, site not specified: Secondary | ICD-10-CM | POA: Diagnosis not present

## 2011-06-19 DIAGNOSIS — I10 Essential (primary) hypertension: Secondary | ICD-10-CM | POA: Diagnosis not present

## 2011-08-06 ENCOUNTER — Encounter (INDEPENDENT_AMBULATORY_CARE_PROVIDER_SITE_OTHER): Payer: Medicare Other

## 2011-08-06 ENCOUNTER — Other Ambulatory Visit: Payer: Self-pay | Admitting: Cardiology

## 2011-08-06 DIAGNOSIS — R209 Unspecified disturbances of skin sensation: Secondary | ICD-10-CM

## 2011-08-06 DIAGNOSIS — I6529 Occlusion and stenosis of unspecified carotid artery: Secondary | ICD-10-CM

## 2011-08-15 ENCOUNTER — Encounter: Payer: Self-pay | Admitting: Cardiology

## 2011-08-15 NOTE — Telephone Encounter (Signed)
Left message for pt to call back about carotid duplex results.

## 2011-08-15 NOTE — Telephone Encounter (Signed)
Follow- up: ° ° °Patient returned your phone call. Please call back. °

## 2011-08-15 NOTE — Telephone Encounter (Signed)
This encounter was created in error - please disregard.

## 2011-08-15 NOTE — Telephone Encounter (Signed)
New Problem:     Patient returned your phone call.  Please call back.

## 2011-09-04 DIAGNOSIS — J209 Acute bronchitis, unspecified: Secondary | ICD-10-CM | POA: Diagnosis not present

## 2011-09-04 DIAGNOSIS — J069 Acute upper respiratory infection, unspecified: Secondary | ICD-10-CM | POA: Diagnosis not present

## 2011-09-18 DIAGNOSIS — R030 Elevated blood-pressure reading, without diagnosis of hypertension: Secondary | ICD-10-CM | POA: Diagnosis not present

## 2011-09-18 DIAGNOSIS — R42 Dizziness and giddiness: Secondary | ICD-10-CM | POA: Diagnosis not present

## 2011-09-18 DIAGNOSIS — IMO0002 Reserved for concepts with insufficient information to code with codable children: Secondary | ICD-10-CM | POA: Diagnosis not present

## 2011-09-18 DIAGNOSIS — M549 Dorsalgia, unspecified: Secondary | ICD-10-CM | POA: Diagnosis not present

## 2011-09-23 ENCOUNTER — Other Ambulatory Visit: Payer: Self-pay | Admitting: Endocrinology

## 2011-10-05 ENCOUNTER — Other Ambulatory Visit: Payer: Self-pay | Admitting: Cardiology

## 2011-10-14 ENCOUNTER — Encounter: Payer: Self-pay | Admitting: Cardiology

## 2011-10-14 DIAGNOSIS — D649 Anemia, unspecified: Secondary | ICD-10-CM | POA: Diagnosis not present

## 2011-10-14 DIAGNOSIS — N183 Chronic kidney disease, stage 3 unspecified: Secondary | ICD-10-CM | POA: Diagnosis not present

## 2011-10-14 DIAGNOSIS — I10 Essential (primary) hypertension: Secondary | ICD-10-CM | POA: Diagnosis not present

## 2011-10-14 DIAGNOSIS — N390 Urinary tract infection, site not specified: Secondary | ICD-10-CM | POA: Diagnosis not present

## 2011-10-30 DIAGNOSIS — N183 Chronic kidney disease, stage 3 unspecified: Secondary | ICD-10-CM | POA: Diagnosis not present

## 2011-11-17 DIAGNOSIS — Z1212 Encounter for screening for malignant neoplasm of rectum: Secondary | ICD-10-CM | POA: Diagnosis not present

## 2011-11-17 DIAGNOSIS — N951 Menopausal and female climacteric states: Secondary | ICD-10-CM | POA: Diagnosis not present

## 2011-11-17 DIAGNOSIS — M810 Age-related osteoporosis without current pathological fracture: Secondary | ICD-10-CM | POA: Diagnosis not present

## 2011-11-17 DIAGNOSIS — Z1231 Encounter for screening mammogram for malignant neoplasm of breast: Secondary | ICD-10-CM | POA: Diagnosis not present

## 2011-11-21 DIAGNOSIS — G459 Transient cerebral ischemic attack, unspecified: Secondary | ICD-10-CM | POA: Diagnosis not present

## 2011-11-21 DIAGNOSIS — IMO0002 Reserved for concepts with insufficient information to code with codable children: Secondary | ICD-10-CM | POA: Diagnosis not present

## 2011-11-21 DIAGNOSIS — S060X9A Concussion with loss of consciousness of unspecified duration, initial encounter: Secondary | ICD-10-CM | POA: Diagnosis not present

## 2011-11-21 DIAGNOSIS — M549 Dorsalgia, unspecified: Secondary | ICD-10-CM | POA: Diagnosis not present

## 2011-11-24 ENCOUNTER — Ambulatory Visit: Payer: Medicare Other | Admitting: Internal Medicine

## 2011-12-04 LAB — HM MAMMOGRAPHY: HM Mammogram: NORMAL

## 2011-12-09 DIAGNOSIS — J0100 Acute maxillary sinusitis, unspecified: Secondary | ICD-10-CM | POA: Diagnosis not present

## 2011-12-09 DIAGNOSIS — H60509 Unspecified acute noninfective otitis externa, unspecified ear: Secondary | ICD-10-CM | POA: Diagnosis not present

## 2011-12-12 DIAGNOSIS — M720 Palmar fascial fibromatosis [Dupuytren]: Secondary | ICD-10-CM | POA: Diagnosis not present

## 2012-01-02 ENCOUNTER — Ambulatory Visit (INDEPENDENT_AMBULATORY_CARE_PROVIDER_SITE_OTHER): Payer: Medicare Other | Admitting: Endocrinology

## 2012-01-02 ENCOUNTER — Encounter: Payer: Self-pay | Admitting: Endocrinology

## 2012-01-02 ENCOUNTER — Ambulatory Visit (INDEPENDENT_AMBULATORY_CARE_PROVIDER_SITE_OTHER)
Admission: RE | Admit: 2012-01-02 | Discharge: 2012-01-02 | Disposition: A | Discharge status: Home / Self Care | Payer: Medicare Other | Source: Ambulatory Visit | Attending: Endocrinology | Admitting: Endocrinology

## 2012-01-02 VITALS — BP 140/82 | HR 62 | Temp 98.4°F | Ht 64.0 in | Wt 125.0 lb

## 2012-01-02 DIAGNOSIS — I10 Essential (primary) hypertension: Secondary | ICD-10-CM

## 2012-01-02 DIAGNOSIS — R0602 Shortness of breath: Secondary | ICD-10-CM

## 2012-01-02 DIAGNOSIS — R918 Other nonspecific abnormal finding of lung field: Secondary | ICD-10-CM | POA: Diagnosis not present

## 2012-01-02 NOTE — Patient Instructions (Addendum)
here is a sample of "advair-100."  take 1 puff 2x a day.  rinse mouth after using. Refer to a lung specialist.  you will receive a phone call, about a day and time for an appointment A chest-x-ray is requested for you today.  You will receive a letter with results.

## 2012-01-02 NOTE — Progress Notes (Signed)
Subjective:    Patient ID: Katrina Yang, female    DOB: June 10, 1940, 71 y.o.   MRN: [id number]  HPI Pt states few weeks of slight sob sensation in the chest, in the context of exertion, but no assoc chest pain Past Medical History  Diagnosis Date  . Hypertension   . Palpitations   . Pure hypercholesterolemia   . CVA (cerebral vascular accident)     hx of  . Secondary hyperparathyroidism (of renal origin)   . Pulmonary nodule   . GERD (gastroesophageal reflux disease)   . Diverticulosis of colon (without mention of hemorrhage)   . Colonic polyp     hx of  . Degenerative joint disease   . Personal history of unspecified urinary disorder   . Osteoporosis, unspecified   . Unspecified hypothyroidism   . History of hysterectomy     Past Surgical History  Procedure Date  . Back surgery     diskectomy L-5. Multiple back surgeries  . Abdominal hysterectomy     History   Social History  . Marital Status: Married    Spouse Name: N/A    Number of Children: 1  . 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 Smoking status: Never Smoker   . Smokeless tobacco: Not on file  . Alcohol Use: No  . Drug Use: No  . Sexually Active: Not on file   Other Topics Concern  . Not on file   Social History Narrative  . No narrative on file    Current Outpatient Prescriptions on File Prior to Visit  Medication Sig Dispense Refill  . amLODipine (NORVASC) 10 MG tablet TAKE 1 TABLET DAILY  90 tablet  3  . aspirin 81 MG tablet Take 81 mg by mouth daily.        . calcitRIOL (ROCALTROL) 0.25 MCG capsule TAKE 1 CAPSULE DAILY  90 capsule  2  . clopidogrel (PLAVIX) 75 MG tablet Take 75 mg by mouth daily.        . Fluticasone-Salmeterol (ADVAIR) 100-50 MCG/DOSE AEPB Inhale 1 puff into the lungs every 12 (twelve) hours.      . gabapentin (NEURONTIN) 300 MG capsule Take 300 mg by mouth 2 (two) times daily.        Marland Kitchen levothyroxine (SYNTHROID,  LEVOTHROID) 88 MCG tablet TAKE 1 TABLET ONCE DAILY  90 tablet  1  . lisinopril (PRINIVIL,ZESTRIL) 2.5 MG tablet Take 2.5 mg by mouth daily.       . metoprolol (TOPROL-XL) 50 MG 24 hr tablet TAKE 1 TABLET DAILY  90 tablet  3  . Multiple Vitamin (MULTIVITAMIN) tablet Take 1 tablet by mouth daily.        . Omega-3 Fatty Acids (FISH OIL) 1200 MG CAPS Take 2 capsules by mouth 2 (two) times daily.       . Cholecalciferol (VITAMIN D) 1000 UNITS capsule Take 1,000 Units by mouth daily.          Allergies  Allergen Reactions  . Codeine   . Iodine   . Iohexol   . Lovastatin   . Metronidazole     REACTION: Reaction not known  . Sulfamethoxazole W-Trimethoprim   . Sulfonamide Derivatives     Family History  Problem Relation Age of Onset  . Kidney cancer Father   . Heart attack Father   . Colon cancer Mother   . Ovarian cancer Mother   . Heart disease  Mother   . Heart disease Father     BP 140/82  Pulse 62  Temp 98.4 F (36.9 C) (Oral)  Ht 5\' 4"  (1.626 m)  Wt 125 lb (56.7 kg)  BMI 21.46 kg/m2  SpO2 97%   Review of Systems She has wheezing, but no fever.     Objective:   Physical Exam VITAL SIGNS:  See vs page GENERAL: no distress LUNGS:  Clear to auscultation    (i reviewed spirometry report)    Assessment & Plan:  Doe, uncertain etiology, new HTN, with probable situational component

## 2012-01-03 LAB — HM PAP SMEAR: HM Pap smear: NORMAL

## 2012-01-12 DIAGNOSIS — M720 Palmar fascial fibromatosis [Dupuytren]: Secondary | ICD-10-CM | POA: Diagnosis not present

## 2012-01-12 DIAGNOSIS — L570 Actinic keratosis: Secondary | ICD-10-CM | POA: Diagnosis not present

## 2012-01-12 DIAGNOSIS — L821 Other seborrheic keratosis: Secondary | ICD-10-CM | POA: Diagnosis not present

## 2012-01-14 ENCOUNTER — Encounter: Payer: Self-pay | Admitting: Endocrinology

## 2012-01-14 ENCOUNTER — Other Ambulatory Visit (INDEPENDENT_AMBULATORY_CARE_PROVIDER_SITE_OTHER): Payer: Medicare Other

## 2012-01-14 ENCOUNTER — Ambulatory Visit (INDEPENDENT_AMBULATORY_CARE_PROVIDER_SITE_OTHER): Payer: Medicare Other | Admitting: Endocrinology

## 2012-01-14 VITALS — BP 130/82 | HR 70 | Temp 97.3°F | Ht 64.0 in | Wt 125.0 lb

## 2012-01-14 DIAGNOSIS — E7800 Pure hypercholesterolemia, unspecified: Secondary | ICD-10-CM

## 2012-01-14 DIAGNOSIS — N2581 Secondary hyperparathyroidism of renal origin: Secondary | ICD-10-CM

## 2012-01-14 DIAGNOSIS — Z23 Encounter for immunization: Secondary | ICD-10-CM | POA: Diagnosis not present

## 2012-01-14 DIAGNOSIS — M810 Age-related osteoporosis without current pathological fracture: Secondary | ICD-10-CM

## 2012-01-14 DIAGNOSIS — N289 Disorder of kidney and ureter, unspecified: Secondary | ICD-10-CM | POA: Diagnosis not present

## 2012-01-14 DIAGNOSIS — Z79899 Other long term (current) drug therapy: Secondary | ICD-10-CM | POA: Diagnosis not present

## 2012-01-14 DIAGNOSIS — E039 Hypothyroidism, unspecified: Secondary | ICD-10-CM

## 2012-01-14 DIAGNOSIS — Z0000 Encounter for general adult medical examination without abnormal findings: Secondary | ICD-10-CM | POA: Diagnosis not present

## 2012-01-14 LAB — CBC WITH DIFFERENTIAL/PLATELET
Basophils Relative: 0.7 % (ref 0.0–3.0)
Eosinophils Relative: 6.7 % — ABNORMAL HIGH (ref 0.0–5.0)
Hemoglobin: 12 g/dL (ref 12.0–15.0)
MCV: 91.2 fl (ref 78.0–100.0)
Monocytes Absolute: 0.6 10*3/uL (ref 0.1–1.0)
Neutro Abs: 3.9 10*3/uL (ref 1.4–7.7)
Neutrophils Relative %: 57.5 % (ref 43.0–77.0)
RBC: 4.01 Mil/uL (ref 3.87–5.11)
WBC: 6.9 10*3/uL (ref 4.5–10.5)

## 2012-01-14 NOTE — Patient Instructions (Addendum)
please consider these measures for your health:  minimize alcohol.  do not use tobacco products.  have a colonoscopy at least every 10 years from age 70.  Women should have an annual mammogram from age 29.  keep firearms safely stored.  always use seat belts.  have working smoke alarms in your home.  see an eye doctor and dentist regularly.  never drive under the influence of alcohol or drugs (including prescription drugs).  those with fair skin should take precautions against the sun. please let me know what your wishes would be, if artificial life support measures should become necessary.  it is critically important to prevent falling down (keep floor areas well-lit, dry, and free of loose objects.  If you have a cane, walker, or wheelchair, you should use it, even for short trips around the house.  Also, try not to rush) Please schedule a bone-density test at the appointment desk.   (update: we discussed code status.  pt requests full code, but would not want to be started or maintained on artificial life-support measures if there was not a reasonable chance of recovery)

## 2012-01-14 NOTE — Progress Notes (Signed)
Subjective:    Patient ID: Katrina Yang, female    DOB: [birth year], 71 y.o.   MRN: [id number]  HPI The state of at least three ongoing medical problems is addressed today: HTN: denies chest pain Renal insuff: denies sob Secondary hyperparathyroidism: he denies cramps Past Medical History  Diagnosis Date  . Hypertension   . Palpitations   . Pure hypercholesterolemia   . CVA (cerebral vascular accident)     hx of  . Secondary hyperparathyroidism (of renal origin)   . Pulmonary nodule   . GERD (gastroesophageal reflux disease)   . Diverticulosis of colon (without mention of hemorrhage)   . Colonic polyp     hx of  . Degenerative joint disease   . Personal history of unspecified urinary disorder   . Osteoporosis, unspecified   . Unspecified hypothyroidism   . History of hysterectomy     Past Surgical History  Procedure Date  . Back surgery     diskectomy L-5. Multiple back surgeries  . Abdominal hysterectomy     History   Social History  . Marital Status: Married    Spouse Name: N/A    Number of Children: 1  . 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 Smoking status: Never Smoker   . Smokeless tobacco: Not on file  . Alcohol Use: No  . Drug Use: No  . Sexually Active: Not on file   Other Topics Concern  . Not on file   Social History Narrative  . No narrative on file    Current Outpatient Prescriptions on File Prior to Visit  Medication Sig Dispense Refill  . amLODipine (NORVASC) 10 MG tablet TAKE 1 TABLET DAILY  90 tablet  3  . aspirin 81 MG tablet Take 81 mg by mouth daily.        . calcitRIOL (ROCALTROL) 0.25 MCG capsule TAKE 1 CAPSULE DAILY  90 capsule  2  . clopidogrel (PLAVIX) 75 MG tablet Take 75 mg by mouth daily.        Marland Kitchen gabapentin (NEURONTIN) 300 MG capsule Take 300 mg by mouth 2 (two) times daily.        Marland Kitchen levothyroxine (SYNTHROID, LEVOTHROID) 88 MCG tablet TAKE 1 TABLET ONCE DAILY  90  tablet  1  . lisinopril (PRINIVIL,ZESTRIL) 2.5 MG tablet Take 2.5 mg by mouth daily.       . metoprolol (TOPROL-XL) 50 MG 24 hr tablet TAKE 1 TABLET DAILY  90 tablet  3  . Multiple Vitamin (MULTIVITAMIN) tablet Take 1 tablet by mouth daily.        . Omega-3 Fatty Acids (FISH OIL) 1200 MG CAPS Take 2 capsules by mouth 2 (two) times daily.         Allergies  Allergen Reactions  . Codeine   . Iodine   . Iohexol   . Lovastatin   . Metronidazole     REACTION: Reaction not known  . Sulfamethoxazole W-Trimethoprim   . Sulfonamide Derivatives     Family History  Problem Relation Age of Onset  . Kidney cancer Father   . Heart attack Father   . Colon cancer Mother   . Ovarian cancer Mother   . Heart disease Mother   . Heart disease Father     BP 130/82  Pulse 70  Temp 97.3 F (36.3 C) (Oral)  Ht 5\' 4"  (1.626 m)  Wt 125 lb (56.7 kg)  BMI  21.46 kg/m2  SpO2 94%    Review of Systems No change in LLE numbness and edema    Objective:   Physical Exam VS: see vs page GEN: no distress HEAD: head: no deformity eyes: no periorbital swelling, no proptosis external nose and ears are normal mouth: no lesion seen NECK: supple, thyroid is not enlarged CHEST WALL: no deformity LUNGS:  Clear to auscultation BREASTS:  sees gyn CV: reg rate and rhythm, no murmur ABD: abdomen is soft, nontender.  no hepatosplenomegaly.  not distended.  no hernia GENITALIA/RECTAL: sees gyn MUSCULOSKELETAL: muscle bulk and strength are grossly normal.  no obvious joint swelling.  gait is normal and steady EXTEMITIES: no deformity.  no ulcer on the feet.  feet are of normal color and temp.  no edema PULSES: dorsalis pedis intact bilat.  no carotid bruit NEURO:  cn 2-12 grossly intact.   readily moves all 4's.  sensation is intact to touch on the feet, but decreased on the LLE SKIN:  Normal texture and temperature.  No rash or suspicious lesion is visible.   NODES:  None palpable at the neck. PSYCH:  alert, oriented x3.  Does not appear anxious nor depressed.    Lab Results  Component Value Date   WBC 6.9 01/14/2012   HGB 12.0 01/14/2012   HCT 36.6 01/14/2012   PLT 241.0 01/14/2012   GLUCOSE 89 01/14/2012   CHOL 264* 01/14/2012   TRIG 73.0 01/14/2012   HDL 81.60 01/14/2012   LDLDIRECT 155.1 01/14/2012   LDLCALC  Value: 122        Total Cholesterol/HDL:CHD Risk Coronary Heart Disease Risk Table                     Men   Women  1/2 Average Risk   3.4   3.3  Average Risk       5.0   4.4  2 X Average Risk   9.6   7.1  3 X Average Risk  23.4   11.0        Use the calculated Patient Ratio above and the CHD Risk Table to determine the patient's CHD Risk.        ATP III CLASSIFICATION (LDL):  <100     mg/dL   Optimal  100-129  mg/dL   Near or Above                    Optimal  130-159  mg/dL   Borderline  160-189  mg/dL   High  >190     mg/dL   Very High* 07/09/2009   ALT 20 01/14/2012   AST 31 01/14/2012   NA 141 01/14/2012   K 4.8 01/14/2012   CL 107 01/14/2012   CREATININE 1.9* 01/14/2012   BUN 38* 01/14/2012   CO2 26 01/14/2012   TSH 0.61 01/14/2012   INR 0.98 07/09/2009   Lab Results  Component Value Date   PTH 48.2 01/14/2012   CALCIUM 9.4 01/14/2012   CALCIUM 9.5 01/14/2012       Assessment & Plan:  Renal insuff, worse Secondary hyperparathyroidism, well-controlled Hypothyroidism, well-replaced HTN, well-controlled    Subjective:   Patient here for Medicare annual wellness visit and management of other chronic and acute problems.     Risk factors: advanced age    28 of Physicians Providing Medical Care to Patient:  See "snapshot"   Activities of Daily Living: In your present state of health, do you have any  difficulty performing the following activities?:  Preparing food and eating?: No  Bathing yourself: No  Getting dressed: No  Using the toilet:No  Moving around from place to place: No  In the past year have you fallen or had a near fall?:No    Home Safety: Has smoke  detector and wears seat belts. Firearms are safely stored. No excess sun exposure.  Diet and Exercise  Current exercise habits: pt says good Dietary issues discussed: pt reports a healthy diet   Depression Screen  Q1: Over the past two weeks, have you felt down, depressed or hopeless? no  Q2: Over the past two weeks, have you felt little interest or pleasure in doing things? no   The following portions of the patient's history were reviewed and updated as appropriate: allergies, current medications, past family history, past medical history, past social history, past surgical history and problem list.  Past Medical History  Diagnosis Date  . Hypertension   . Palpitations   . Pure hypercholesterolemia   . CVA (cerebral vascular accident)     hx of  . Secondary hyperparathyroidism (of renal origin)   . Pulmonary nodule   . GERD (gastroesophageal reflux disease)   . Diverticulosis of colon (without mention of hemorrhage)   . Colonic polyp     hx of  . Degenerative joint disease   . Personal history of unspecified urinary disorder   . Osteoporosis, unspecified   . Unspecified hypothyroidism   . History of hysterectomy     Past Surgical History  Procedure Date  . Back surgery     diskectomy L-5. Multiple back surgeries  . Abdominal hysterectomy     History   Social History  . Marital Status: Married    Spouse Name: N/A    Number of Children: 1  . 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 Smoking status: Never Smoker   . Smokeless tobacco: Not on file  . Alcohol Use: No  . Drug Use: No  . Sexually Active: Not on file   Other Topics Concern  . Not on file   Social History Narrative  . No narrative on file    Current Outpatient Prescriptions on File Prior to Visit  Medication Sig Dispense Refill  . amLODipine (NORVASC) 10 MG tablet TAKE 1 TABLET DAILY  90 tablet  3  . aspirin 81 MG tablet Take 81 mg by  mouth daily.        . calcitRIOL (ROCALTROL) 0.25 MCG capsule TAKE 1 CAPSULE DAILY  90 capsule  2  . clopidogrel (PLAVIX) 75 MG tablet Take 75 mg by mouth daily.        Marland Kitchen gabapentin (NEURONTIN) 300 MG capsule Take 300 mg by mouth 2 (two) times daily.        Marland Kitchen levothyroxine (SYNTHROID, LEVOTHROID) 88 MCG tablet TAKE 1 TABLET ONCE DAILY  90 tablet  1  . lisinopril (PRINIVIL,ZESTRIL) 2.5 MG tablet Take 2.5 mg by mouth daily.       . metoprolol (TOPROL-XL) 50 MG 24 hr tablet TAKE 1 TABLET DAILY  90 tablet  3  . Multiple Vitamin (MULTIVITAMIN) tablet Take 1 tablet by mouth daily.        . Omega-3 Fatty Acids (FISH OIL) 1200 MG CAPS Take 2 capsules by mouth 2 (two) times daily.         Allergies  Allergen Reactions  . Codeine   .  Iodine   . Iohexol   . Lovastatin   . Metronidazole     REACTION: Reaction not known  . Sulfamethoxazole W-Trimethoprim   . Sulfonamide Derivatives     Family History  Problem Relation Age of Onset  . Kidney cancer Father   . Heart attack Father   . Colon cancer Mother   . Ovarian cancer Mother   . Heart disease Mother   . Heart disease Father     BP 130/82  Pulse 70  Temp 97.3 F (36.3 C) (Oral)  Ht 5\' 4"  (1.626 m)  Wt 125 lb (56.7 kg)  BMI 21.46 kg/m2  SpO2 94%   Review of Systems  Denies hearing loss, and visual loss Objective:   Vision:  Sees opthalmologist Hearing: grossly normal Body mass index:  See vs page Msk: pt easily and quickly performs "get-up-and-go" from a sitting position Cognitive Impairment Assessment: cognition, memory and judgment appear normal.  remembers 3/3 at 5 minutes.  excellent recall.  can easily read and write a sentence.  alert and oriented x 3   Assessment:   Medicare wellness utd on preventive parameters    Plan:   During the course of the visit the patient was educated and counseled about appropriate screening and preventive services including:        Fall prevention   Screening mammography  Bone  densitometry screening  Diabetes screening  Nutrition counseling   Vaccines / LABS Zostavax / Pnemonccoal Vaccine: pt says she has had this  Patient Instructions (the written plan) was given to the patient.

## 2012-01-15 LAB — LIPID PANEL
Cholesterol: 264 mg/dL — ABNORMAL HIGH (ref 0–200)
HDL: 81.6 mg/dL (ref >39.00)
VLDL: 14.6 mg/dL (ref 0.0–40.0)

## 2012-01-15 LAB — BASIC METABOLIC PANEL
BUN: 38 mg/dL — ABNORMAL HIGH (ref 6–23)
Calcium: 9.5 mg/dL (ref 8.4–10.5)
GFR: 27.91 mL/min — ABNORMAL LOW (ref >60.00)
Glucose, Bld: 89 mg/dL (ref 70–99)
Sodium: 141 mEq/L (ref 135–145)

## 2012-01-15 LAB — HEPATIC FUNCTION PANEL
ALT: 20 U/L (ref 0–35)
Total Bilirubin: 0.6 mg/dL (ref 0.3–1.2)

## 2012-01-15 LAB — PTH, INTACT AND CALCIUM: Calcium, Total (PTH): 9.4 mg/dL (ref 8.4–10.5)

## 2012-01-16 ENCOUNTER — Telehealth: Payer: Self-pay | Admitting: Endocrinology

## 2012-01-16 ENCOUNTER — Encounter: Payer: Self-pay | Admitting: Endocrinology

## 2012-01-16 NOTE — Telephone Encounter (Signed)
All good, except creatinine (kidneys) slightly higher at 1.9.  Nothing needs to be done now, but please continue to see kidney dr

## 2012-01-16 NOTE — Telephone Encounter (Signed)
Please advise on lab results.

## 2012-01-16 NOTE — Telephone Encounter (Signed)
The patient called the triage line hoping to get the results of her blood work.  I attempted to open the letter that was sent, but it is not pulling up on my screen.  She is hoping to get a call back as soon as possible.  Thanks!

## 2012-01-16 NOTE — Telephone Encounter (Signed)
Pt informed of lab results via VM and to callback office with any questions/concerns, labs also mailed to pt.

## 2012-01-21 ENCOUNTER — Telehealth: Payer: Self-pay | Admitting: Endocrinology

## 2012-01-21 ENCOUNTER — Ambulatory Visit (INDEPENDENT_AMBULATORY_CARE_PROVIDER_SITE_OTHER)
Admission: RE | Admit: 2012-01-21 | Discharge: 2012-01-21 | Disposition: A | Discharge status: Home / Self Care | Payer: Medicare Other | Source: Ambulatory Visit | Attending: Endocrinology | Admitting: Endocrinology

## 2012-01-21 DIAGNOSIS — N289 Disorder of kidney and ureter, unspecified: Secondary | ICD-10-CM

## 2012-01-21 DIAGNOSIS — M810 Age-related osteoporosis without current pathological fracture: Secondary | ICD-10-CM

## 2012-01-21 NOTE — Telephone Encounter (Signed)
Pt wants to see dr Felicity Pellegrini at [facility] done

## 2012-01-28 ENCOUNTER — Telehealth: Payer: Self-pay | Admitting: Cardiology

## 2012-01-28 NOTE — Telephone Encounter (Signed)
I spoke with the pt and she is complaining of fatigue that has continued to worsen over the past few weeks.  The pt denies CP and SOB.  The pt had a recent physical with Dr Loanne Drilling but did not mention her fatigue. I offered the pt and earlier appointment with a PA or NP in the office but she wanted to hold off on appointment.  The pt said she was going to contact Dr Cordelia Pen office about her fatigue to see if he had any recommendations.

## 2012-01-28 NOTE — Telephone Encounter (Signed)
Pt having problems and wants a sooner appt

## 2012-01-29 ENCOUNTER — Ambulatory Visit (INDEPENDENT_AMBULATORY_CARE_PROVIDER_SITE_OTHER): Payer: Medicare Other | Admitting: Endocrinology

## 2012-01-29 ENCOUNTER — Encounter: Payer: Self-pay | Admitting: Endocrinology

## 2012-01-29 VITALS — BP 124/80 | Temp 97.2°F | Wt 126.0 lb

## 2012-01-29 DIAGNOSIS — R5381 Other malaise: Secondary | ICD-10-CM | POA: Diagnosis not present

## 2012-01-29 DIAGNOSIS — Z23 Encounter for immunization: Secondary | ICD-10-CM | POA: Diagnosis not present

## 2012-01-29 DIAGNOSIS — R5383 Other fatigue: Secondary | ICD-10-CM

## 2012-01-29 NOTE — Patient Instructions (Addendum)
The only reasons i can find for your symptoms are the gabapentin and the metoprolol.   In order to reduce either of these, you would need to ask one of the doctors who prescribes these.

## 2012-01-29 NOTE — Progress Notes (Signed)
Subjective:    Patient ID: Katrina Yang, female    DOB: 06-21-1940, 71 y.o.   MRN: [id number]  HPI Pt states few mos of moderate generalized weakness throughout the body, and assoc tremor Past Medical History  Diagnosis Date  . Hypertension   . Palpitations   . Pure hypercholesterolemia   . CVA (cerebral vascular accident)     hx of  . Secondary hyperparathyroidism (of renal origin)   . Pulmonary nodule   . GERD (gastroesophageal reflux disease)   . Diverticulosis of colon (without mention of hemorrhage)   . Colonic polyp     hx of  . Degenerative joint disease   . Personal history of unspecified urinary disorder   . Osteoporosis, unspecified   . Unspecified hypothyroidism   . History of hysterectomy     Past Surgical History  Procedure Date  . Back surgery     diskectomy L-5. Multiple back surgeries  . Abdominal hysterectomy     History   Social History  . Marital Status: Married    Spouse Name: N/A    Number of Children: 1  . 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 Smoking status: Never Smoker   . Smokeless tobacco: Not on file  . Alcohol Use: No  . Drug Use: No  . Sexually Active: Not on file   Other Topics Concern  . Not on file   Social History Narrative  . No narrative on file    Current Outpatient Prescriptions on File Prior to Visit  Medication Sig Dispense Refill  . amLODipine (NORVASC) 10 MG tablet TAKE 1 TABLET DAILY  90 tablet  3  . aspirin 81 MG tablet Take 81 mg by mouth daily.        . calcitRIOL (ROCALTROL) 0.25 MCG capsule TAKE 1 CAPSULE DAILY  90 capsule  2  . clopidogrel (PLAVIX) 75 MG tablet Take 75 mg by mouth daily.        Marland Kitchen gabapentin (NEURONTIN) 300 MG capsule Take 300 mg by mouth 2 (two) times daily.        Marland Kitchen levothyroxine (SYNTHROID, LEVOTHROID) 88 MCG tablet TAKE 1 TABLET ONCE DAILY  90 tablet  1  . lisinopril (PRINIVIL,ZESTRIL) 2.5 MG tablet Take 2.5 mg by mouth  daily.       . metoprolol (TOPROL-XL) 50 MG 24 hr tablet TAKE 1 TABLET DAILY  90 tablet  3  . Multiple Vitamin (MULTIVITAMIN) tablet Take 1 tablet by mouth daily.        . Omega-3 Fatty Acids (FISH OIL) 1200 MG CAPS Take 2 capsules by mouth 2 (two) times daily.         Allergies  Allergen Reactions  . Codeine   . Iodine   . Iohexol   . Lovastatin   . Metronidazole     REACTION: Reaction not known  . Sulfamethoxazole W-Trimethoprim   . Sulfonamide Derivatives     Family History  Problem Relation Age of Onset  . Kidney cancer Father   . Heart attack Father   . Colon cancer Mother   . Ovarian cancer Mother   . Heart disease Mother   . Heart disease Father     BP 124/80  Temp 97.2 F (36.2 C) (Oral)  Wt 126 lb (57.153 kg)    Review of Systems Depression persists.  No insomnia    Objective:   Physical Exam VITAL SIGNS:  See vs page GENERAL: no distress PSYCH: Alert and oriented x 3.  Does not appear anxious nor depressed. Gait is normal and steady.   Lab Results  Component Value Date   WBC 6.9 01/14/2012   HGB 12.0 01/14/2012   HCT 36.6 01/14/2012   PLT 241.0 01/14/2012   GLUCOSE 89 01/14/2012   CHOL 264* 01/14/2012   TRIG 73.0 01/14/2012   HDL 81.60 01/14/2012   LDLDIRECT 155.1 01/14/2012   LDLCALC  Value: 122        Total Cholesterol/HDL:CHD Risk Coronary Heart Disease Risk Table                     Men   Women  1/2 Average Risk   3.4   3.3  Average Risk       5.0   4.4  2 X Average Risk   9.6   7.1  3 X Average Risk  23.4   11.0        Use the calculated Patient Ratio above and the CHD Risk Table to determine the patient's CHD Risk.        ATP III CLASSIFICATION (LDL):  <100     mg/dL   Optimal  100-129  mg/dL   Near or Above                    Optimal  130-159  mg/dL   Borderline  160-189  mg/dL   High  >190     mg/dL   Very High* 07/09/2009   ALT 20 01/14/2012   AST 31 01/14/2012   NA 141 01/14/2012   K 4.8 01/14/2012   CL 107 01/14/2012   CREATININE 1.9* 01/14/2012    BUN 38* 01/14/2012   CO2 26 01/14/2012   TSH 0.61 01/14/2012   INR 0.98 07/09/2009      Assessment & Plan:  Tremor and other sxs, uncertain etiology

## 2012-01-31 DIAGNOSIS — R5383 Other fatigue: Secondary | ICD-10-CM | POA: Insufficient documentation

## 2012-02-01 ENCOUNTER — Other Ambulatory Visit: Payer: Self-pay | Admitting: Cardiology

## 2012-02-02 ENCOUNTER — Institutional Professional Consult (permissible substitution): Payer: Medicare Other | Admitting: Pulmonary Disease

## 2012-02-04 ENCOUNTER — Ambulatory Visit: Payer: Medicare Other | Admitting: Cardiology

## 2012-02-11 ENCOUNTER — Encounter: Payer: Self-pay | Admitting: Endocrinology

## 2012-02-24 ENCOUNTER — Ambulatory Visit: Payer: Medicare Other | Admitting: Cardiology

## 2012-02-25 ENCOUNTER — Ambulatory Visit (INDEPENDENT_AMBULATORY_CARE_PROVIDER_SITE_OTHER): Payer: Medicare Other | Admitting: Cardiology

## 2012-02-25 ENCOUNTER — Encounter: Payer: Self-pay | Admitting: Cardiology

## 2012-02-25 VITALS — BP 128/74 | HR 61 | Ht 63.5 in | Wt 127.4 lb

## 2012-02-25 DIAGNOSIS — I10 Essential (primary) hypertension: Secondary | ICD-10-CM

## 2012-02-25 DIAGNOSIS — N183 Chronic kidney disease, stage 3 unspecified: Secondary | ICD-10-CM | POA: Diagnosis not present

## 2012-02-25 NOTE — Assessment & Plan Note (Signed)
The patient has labile blood pressures, and they are as noted in the walks. Importantly, she has had several episodes where she felt weak, and her blood pressure was down. I taught her out of check her pulse, and she will check this. We could potentially cut back on her amlodipine 5 mg daily, and is suggested that she do this if she has further episodes with lower blood pressure. However, on the opposite side, I want to make sure that she keeps a close eye on her blood pressures do if her amlodipine dropped to 5 mg. We will see her back in followup in a few months, and reassess at that time she is to call if she has further problems in the interim

## 2012-02-25 NOTE — Assessment & Plan Note (Signed)
Pts creatinine has gone up a bit.  She is being followed by renal and they are watching her. They have added an ACE.  They will continue to see her, and I will see her in six months.

## 2012-02-25 NOTE — Progress Notes (Signed)
HPI:  Patient seen in followup. She has episodes which she describes as weak spells. She run a blood pressure log today, in each one of them corresponds to the blood pressure falls and the low 100 range. She certainly has variability and pressure, and they have generally been high. She also brought in her laboratory studies, and her creatinine has steadily risen over the past 4 years to be normal to now being 1.9. She had a renal ultrasound with Doppler study, and this was essentially normal just over a year ago. She had no hydronephrosis, and no evidence of significant renal vascular changes. Also explained to her how to check her pulse to make sure that she does not get bradycardia.  Current Outpatient Prescriptions  Medication Sig Dispense Refill  . amLODipine (NORVASC) 10 MG tablet TAKE 1 TABLET DAILY  90 tablet  3  . aspirin 81 MG tablet Take 81 mg by mouth daily.        . calcitRIOL (ROCALTROL) 0.25 MCG capsule TAKE 1 CAPSULE DAILY  90 capsule  2  . clopidogrel (PLAVIX) 75 MG tablet Take 75 mg by mouth daily.        Marland Kitchen gabapentin (NEURONTIN) 300 MG capsule Take 100 mg by mouth 3 (three) times daily.       Marland Kitchen levothyroxine (SYNTHROID, LEVOTHROID) 88 MCG tablet TAKE 1 TABLET ONCE DAILY  90 tablet  1  . lisinopril (PRINIVIL,ZESTRIL) 2.5 MG tablet Take 2.5 mg by mouth daily.       . metoprolol succinate (TOPROL-XL) 50 MG 24 hr tablet       . Omega-3 Fatty Acids (FISH OIL) 1200 MG CAPS Take 2 capsules by mouth 2 (two) times daily.       Marland Kitchen DISCONTD: metoprolol succinate (TOPROL-XL) 50 MG 24 hr tablet TAKE 1 TABLET DAILY  90 tablet  2    Allergies  Allergen Reactions  . Codeine   . Iodine   . Iohexol   . Lovastatin   . Metronidazole     REACTION: Reaction not known  . Sulfamethoxazole W-Trimethoprim   . Sulfonamide Derivatives     Past Medical History  Diagnosis Date  . Hypertension   . Palpitations   . Pure hypercholesterolemia   . CVA (cerebral vascular accident)     hx of  .  Secondary hyperparathyroidism (of renal origin)   . Pulmonary nodule   . GERD (gastroesophageal reflux disease)   . Diverticulosis of colon (without mention of hemorrhage)   . Colonic polyp     hx of  . Degenerative joint disease   . Personal history of unspecified urinary disorder   . Osteoporosis, unspecified   . Unspecified hypothyroidism   . History of hysterectomy     Past Surgical History  Procedure Date  . Back surgery     diskectomy L-5. Multiple back surgeries  . Abdominal hysterectomy     Family History  Problem Relation Age of Onset  . Kidney cancer Father   . Heart attack Father   . Colon cancer Mother   . Ovarian cancer Mother   . Heart disease Mother   . Heart disease Father     History   Social History  . Marital Status: Married    Spouse Name: N/A    Number of Children: 1  . 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 Smoking status: Never Smoker   . Smokeless tobacco:  Not on file  . Alcohol Use: No  . Drug Use: No  . Sexually Active: Not on file   Other Topics Concern  . Not on file   Social History Narrative  . No narrative on file    ROS: Please see the HPI.  All other systems reviewed and negative.  PHYSICAL EXAM:  BP 128/74  Pulse 61  Ht 5' 3.5" (1.613 m)  Wt 127 lb 6.4 oz (57.788 kg)  BMI 22.21 kg/m2  General: Well developed, well nourished, in no acute distress. Head:  Normocephalic and atraumatic. Neck: no JVD Lungs: Clear to auscultation and percussion. Heart: Normal S1 and S2.  No murmur, rubs or gallops.  Pulses: Pulses normal in all 4 extremities. Extremities: No clubbing or cyanosis. No edema. Neurologic: Alert and oriented x 3.  EKG:  ASSESSMENT AND PLAN:

## 2012-02-25 NOTE — Patient Instructions (Addendum)
Your physician recommends that you schedule a follow-up appointment in: MARCH 2014  Your physician recommends that you continue on your current medications as directed. Please refer to the Current Medication list given to you today.  

## 2012-02-26 DIAGNOSIS — I129 Hypertensive chronic kidney disease with stage 1 through stage 4 chronic kidney disease, or unspecified chronic kidney disease: Secondary | ICD-10-CM | POA: Diagnosis not present

## 2012-02-26 DIAGNOSIS — N183 Chronic kidney disease, stage 3 unspecified: Secondary | ICD-10-CM | POA: Diagnosis not present

## 2012-02-27 ENCOUNTER — Other Ambulatory Visit: Payer: Self-pay | Admitting: Endocrinology

## 2012-03-19 DIAGNOSIS — M549 Dorsalgia, unspecified: Secondary | ICD-10-CM | POA: Diagnosis not present

## 2012-06-09 ENCOUNTER — Telehealth: Payer: Self-pay | Admitting: Cardiology

## 2012-06-09 MED ORDER — LISINOPRIL 2.5 MG PO TABS: 2.5000 mg | ORAL_TABLET | Freq: Every day | ORAL | Status: DC

## 2012-06-09 NOTE — Telephone Encounter (Signed)
I spoke with the pt and she needs a refill on Lisinopril.  This mediation was prescribed by Dr Moshe Cipro and they denied refill because the pt did not follow-up.  The pt is now seeing Dr Felicity Pellegrini at [facility] for renal follow-up.  The pt is scheduled to see him in April. I will send in a Rx to last until this appointment.

## 2012-06-09 NOTE — Telephone Encounter (Signed)
New Problem:    Patient called in wanting to speak with you.  Please call back.

## 2012-06-23 ENCOUNTER — Telehealth: Payer: Self-pay | Admitting: Cardiology

## 2012-06-23 NOTE — Telephone Encounter (Signed)
I spoke with the pt and she has been having weak spells when her BP drops.  The pt did discuss this also with Dr Roseanne Kaufman at [facility] and he recommended that the pt decrease her amlodipine to 5mg  daily.  The pt made this change and her BP is running 98/62 and 110/60.  The pt said Dr Roseanne Kaufman wanted to keep her on Lisinopril. I instructed the pt to stop Amlodipine at this time and continue to monitor BP at home.  If the pt's BP continues to remain low or becomes elevated the pt will contact our office for further follow-up.

## 2012-06-23 NOTE — Telephone Encounter (Signed)
PT HAVING PROBLEM WITH bp

## 2012-06-28 ENCOUNTER — Other Ambulatory Visit: Payer: Self-pay | Admitting: *Deleted

## 2012-06-28 MED ORDER — CALCITRIOL 0.25 MCG PO CAPS: ORAL_CAPSULE | ORAL | Status: DC

## 2012-06-30 NOTE — Telephone Encounter (Signed)
I called patient and her BP is much better.  She is now off of amlodipine.  The BP is better now about 130/80.  She will continue to monitor and she is to see me in two weeks at which time we can reassess.

## 2012-07-13 ENCOUNTER — Ambulatory Visit: Payer: Medicare Other | Admitting: Endocrinology

## 2012-07-20 ENCOUNTER — Telehealth: Payer: Self-pay | Admitting: Diagnostic Neuroimaging

## 2012-07-20 NOTE — Telephone Encounter (Addendum)
Pt called and has had episode of sharp pain in L neck, shoulder, numbness foot last Sunday when at the beach.  Had 2 episodes intermittently.    C/o of weakness now and not feeling well.  She had appt with Dr. Loanne Drilling on Monday, which was cancelled and she did not reschedule.  She relayed that sx she experienced some different from when to see Dr. Erling Cruz.  I told her to see Dr. Loanne Drilling, pcp acutely for evaluation.   She had appt with Dr. Lia Foyer tomorrow (routine visit).  Would send message to Dr. Leta Baptist whom she will see in 11/2012.  She verbalized understanding.    I called pt to check on her and she sounded and stated she was feeling better.   She had seen Dr. Loanne Drilling (pcp) and then Dr. Lia Foyer and he changed some of her Bp meds and she will see him back in 3 wks.  Questioning neuro vs. Cardiac problem.   Pt will call back as needed.

## 2012-07-21 ENCOUNTER — Encounter: Payer: Self-pay | Admitting: Cardiology

## 2012-07-21 ENCOUNTER — Encounter: Payer: Self-pay | Admitting: Endocrinology

## 2012-07-21 ENCOUNTER — Ambulatory Visit (INDEPENDENT_AMBULATORY_CARE_PROVIDER_SITE_OTHER): Payer: Medicare Other | Admitting: Cardiology

## 2012-07-21 ENCOUNTER — Ambulatory Visit
Admission: RE | Admit: 2012-07-21 | Discharge: 2012-07-21 | Disposition: A | Discharge status: Home / Self Care | Payer: Medicare Other | Source: Ambulatory Visit | Attending: Endocrinology | Admitting: Endocrinology

## 2012-07-21 ENCOUNTER — Ambulatory Visit (INDEPENDENT_AMBULATORY_CARE_PROVIDER_SITE_OTHER): Payer: BC Managed Care – PPO | Admitting: Endocrinology

## 2012-07-21 VITALS — BP 110/66 | HR 60 | Ht 64.0 in | Wt 127.0 lb

## 2012-07-21 VITALS — BP 126/78 | HR 80 | Wt 127.0 lb

## 2012-07-21 DIAGNOSIS — R51 Headache: Secondary | ICD-10-CM | POA: Diagnosis not present

## 2012-07-21 DIAGNOSIS — I10 Essential (primary) hypertension: Secondary | ICD-10-CM | POA: Diagnosis not present

## 2012-07-21 DIAGNOSIS — R0609 Other forms of dyspnea: Secondary | ICD-10-CM | POA: Diagnosis not present

## 2012-07-21 DIAGNOSIS — R209 Unspecified disturbances of skin sensation: Secondary | ICD-10-CM

## 2012-07-21 DIAGNOSIS — R0989 Other specified symptoms and signs involving the circulatory and respiratory systems: Secondary | ICD-10-CM | POA: Diagnosis not present

## 2012-07-21 DIAGNOSIS — R079 Chest pain, unspecified: Secondary | ICD-10-CM

## 2012-07-21 DIAGNOSIS — R200 Anesthesia of skin: Secondary | ICD-10-CM | POA: Insufficient documentation

## 2012-07-21 DIAGNOSIS — R0600 Dyspnea, unspecified: Secondary | ICD-10-CM

## 2012-07-21 LAB — SEDIMENTATION RATE: Sed Rate: 35 mm/hr — ABNORMAL HIGH (ref 0–22)

## 2012-07-21 LAB — CBC WITH DIFFERENTIAL/PLATELET
Basophils Relative: 0.6 % (ref 0.0–3.0)
Eosinophils Relative: 6.9 % — ABNORMAL HIGH (ref 0.0–5.0)
MCV: 90.9 fl (ref 78.0–100.0)
Monocytes Absolute: 0.7 10*3/uL (ref 0.1–1.0)
Monocytes Relative: 9.9 % (ref 3.0–12.0)
Neutrophils Relative %: 51.4 % (ref 43.0–77.0)
Platelets: 245 10*3/uL (ref 150.0–400.0)
RBC: 4.02 Mil/uL (ref 3.87–5.11)
WBC: 6.7 10*3/uL (ref 4.5–10.5)

## 2012-07-21 LAB — D-DIMER, QUANTITATIVE: D-Dimer, Quant: 0.88 ug/mL-FEU — ABNORMAL HIGH (ref 0.00–0.48)

## 2012-07-21 NOTE — Patient Instructions (Addendum)
A chest-x-ray and blood tests are being requested for you today.  We'll contact you with results.  Refer to a neurology specialist.

## 2012-07-21 NOTE — Telephone Encounter (Signed)
Agree with plan. See PCP first. -VRP

## 2012-07-21 NOTE — Patient Instructions (Addendum)
Your physician recommends that you schedule a follow-up appointment in: August 18, 2012

## 2012-07-21 NOTE — Progress Notes (Signed)
Subjective:    Patient ID: Katrina Yang, female    DOB: 05-Mar-1941, 72 y.o.   MRN: [id number]  HPI Pt states 10 days of moderate pain at the posterior neck, with assoc pain at the left shoulder and arm.   Past Medical History  Diagnosis Date  . Hypertension   . Palpitations   . Pure hypercholesterolemia   . CVA (cerebral vascular accident)     hx of  . Secondary hyperparathyroidism (of renal origin)   . Pulmonary nodule   . GERD (gastroesophageal reflux disease)   . Diverticulosis of colon (without mention of hemorrhage)   . Colonic polyp     hx of  . Degenerative joint disease   . Personal history of unspecified urinary disorder   . Osteoporosis, unspecified   . Unspecified hypothyroidism   . History of hysterectomy     Past Surgical History  Procedure Laterality Date  . Back surgery      diskectomy L-5. Multiple back surgeries  . Abdominal hysterectomy      History   Social History  . Marital Status: Married    Spouse Name: N/A    Number of Children: 1  . 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 Smoking status: Never Smoker   . Smokeless tobacco: Not on file  . Alcohol Use: No  . Drug Use: No  . Sexually Active: Not on file   Other Topics Concern  . Not on file   Social History Narrative  . No narrative on file    Current Outpatient Prescriptions on File Prior to Visit  Medication Sig Dispense Refill  . aspirin 81 MG tablet Take 81 mg by mouth daily.        . calcitRIOL (ROCALTROL) 0.25 MCG capsule TAKE 1 CAPSULE DAILY  90 capsule  2  . clopidogrel (PLAVIX) 75 MG tablet Take 75 mg by mouth daily.        Marland Kitchen levothyroxine (SYNTHROID, LEVOTHROID) 88 MCG tablet TAKE 1 TABLET ONCE DAILY  90 tablet  0  . lisinopril (PRINIVIL,ZESTRIL) 2.5 MG tablet Take 1 tablet (2.5 mg total) by mouth daily.  90 tablet  0  . metoprolol succinate (TOPROL-XL) 50 MG 24 hr tablet       . Omega-3 Fatty Acids (FISH OIL)  1200 MG CAPS Take 2 capsules by mouth 2 (two) times daily.       Marland Kitchen gabapentin (NEURONTIN) 100 MG capsule Take 100 mg by mouth 2 (two) times daily with a meal.       No current facility-administered medications on file prior to visit.    Allergies  Allergen Reactions  . Codeine   . Iodine   . Iohexol   . Lovastatin   . Metronidazole     REACTION: Reaction not known  . Sulfamethoxazole W-Trimethoprim   . Sulfonamide Derivatives     Family History  Problem Relation Age of Onset  . Kidney cancer Father   . Heart attack Father   . Colon cancer Mother   . Ovarian cancer Mother   . Heart disease Mother   . Heart disease Father     BP 126/78  Pulse 80  Wt 127 lb (57.607 kg)  BMI 22.14 kg/m2  SpO2 98%  Review of Systems She also has numbness at the LUE.  She also has slight headache.  Dyspnea is resolved.      Objective:   Physical  Exam VITAL SIGNS:  See vs page GENERAL: no distress Neck: supple LUNGS:  Clear to auscultation, except for a few rales at the bases.   Neuro: sensation is intact to touch on the UE's, and motor function is normal Gait: normal and steady.   Pulses: left radial is intact    D-dimer is elevated Creat is elevated    Assessment & Plan:  Dyspnea, new, uncertain etiology Shoulder pain, new, uncertain etiology Renal insuff.  She can't have CT

## 2012-07-21 NOTE — Progress Notes (Signed)
HPI:   This very nice patient comes in today for followup. She's had some problems recently. About 10 days ago, shortly after church after going to the beach she developed severe pain over left neck, shoulder, down the left arm and radiating around to the front. She's had a prior stroke and has discomfort of left shoulder and side.  She had some shortness of breath and then it resolved.  Her BP was down as well.  She stopped her amlodipine.  She may bit a bit better, but has had some residual symptoms earlier today and had a CXR with Dr. Loanne Drilling and a lot of labs.  She is also set to see neuro on Friday, now with [facility] and not with [facility] associates where she has had her care---they deferred seeing her with Dr. Erling Cruz gone.    Current Outpatient Prescriptions  Medication Sig Dispense Refill  . aspirin 81 MG tablet Take 81 mg by mouth daily.        . calcitRIOL (ROCALTROL) 0.25 MCG capsule TAKE 1 CAPSULE DAILY  90 capsule  2  . clopidogrel (PLAVIX) 75 MG tablet Take 75 mg by mouth daily.        Marland Kitchen gabapentin (NEURONTIN) 100 MG capsule Take 100 mg by mouth 2 (two) times daily with a meal.      . levothyroxine (SYNTHROID, LEVOTHROID) 88 MCG tablet TAKE 1 TABLET ONCE DAILY  90 tablet  0  . lisinopril (PRINIVIL,ZESTRIL) 2.5 MG tablet Take 1 tablet (2.5 mg total) by mouth daily.  90 tablet  0  . metoprolol succinate (TOPROL-XL) 50 MG 24 hr tablet       . Omega-3 Fatty Acids (FISH OIL) 1200 MG CAPS Take 2 capsules by mouth 2 (two) times daily.        No current facility-administered medications for this visit.    Allergies  Allergen Reactions  . Codeine   . Iodine   . Iohexol   . Lovastatin   . Metronidazole     REACTION: Reaction not known  . Sulfamethoxazole W-Trimethoprim   . Sulfonamide Derivatives     Past Medical History  Diagnosis Date  . Hypertension   . Palpitations   . Pure hypercholesterolemia   . CVA (cerebral vascular accident)     hx of  . Secondary  hyperparathyroidism (of renal origin)   . Pulmonary nodule   . GERD (gastroesophageal reflux disease)   . Diverticulosis of colon (without mention of hemorrhage)   . Colonic polyp     hx of  . Degenerative joint disease   . Personal history of unspecified urinary disorder   . Osteoporosis, unspecified   . Unspecified hypothyroidism   . History of hysterectomy     Past Surgical History  Procedure Laterality Date  . Back surgery      diskectomy L-5. Multiple back surgeries  . Abdominal hysterectomy      Family History  Problem Relation Age of Onset  . Kidney cancer Father   . Heart attack Father   . Colon cancer Mother   . Ovarian cancer Mother   . Heart disease Mother   . Heart disease Father     History   Social History  . Marital Status: Married    Spouse Name: N/A    Number of Children: 1  . 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 Smoking status: Never Smoker   .  Smokeless tobacco: Not on file  . Alcohol Use: No  . Drug Use: No  . Sexually Active: Not on file   Other Topics Concern  . Not on file   Social History Narrative  . No narrative on file    ROS: Please see the HPI.  All other systems reviewed and negative.  PHYSICAL EXAM:  BP 110/66  Pulse 60  Ht 5\' 4"  (1.626 m)  Wt 127 lb (57.607 kg)  BMI 21.79 kg/m2  SpO2 96%  General: Well developed, well nourished, in no acute distress. Head:  Normocephalic and atraumatic. Neck: no JVD Lungs: Clear to auscultation and percussion. Back:  Tender to touch over left back and down on side.   Heart: Normal S1 and S2.  No murmur, rubs or gallops.  Pulses: Pulses normal in all 4 extremities. Extremities: No clubbing or cyanosis. No edema. Neurologic: Alert and oriented x 3.  EKG:  NSR.  WNL.    ASSESSMENT AND PLAN:  1.  Atypical type left chest pain --- etiol unclear.  Labs have been done.  Agree with hold on amlodipine for now.  Will see her  back in follow in three weeks to reassess. Not a candidate for a CT scan given elevated Cr.

## 2012-07-22 ENCOUNTER — Encounter: Payer: Self-pay | Admitting: Cardiology

## 2012-07-22 ENCOUNTER — Encounter: Payer: Self-pay | Admitting: Endocrinology

## 2012-07-22 NOTE — Telephone Encounter (Signed)
New problem   Pt stated she need you to call her and it was personal.

## 2012-07-22 NOTE — Telephone Encounter (Signed)
This encounter was created in error - please disregard.

## 2012-07-23 ENCOUNTER — Telehealth: Payer: Self-pay | Admitting: Diagnostic Neuroimaging

## 2012-07-23 ENCOUNTER — Other Ambulatory Visit: Payer: Self-pay | Admitting: Endocrinology

## 2012-07-23 ENCOUNTER — Telehealth: Payer: Self-pay

## 2012-07-23 DIAGNOSIS — R079 Chest pain, unspecified: Secondary | ICD-10-CM

## 2012-07-23 NOTE — Telephone Encounter (Signed)
[facility] at [facility] states the test is ordered for 07/29/12 and the cxr has to be done 24hrs before test, new new order placed

## 2012-07-23 NOTE — Telephone Encounter (Signed)
Pt called back and due to having friends over, she did not let on that she was still feeling bad.  Would like an appt if possible next week.  Had seen Dr. Lia Foyer and Dr. Loanne Drilling this week.

## 2012-07-23 NOTE — Telephone Encounter (Signed)
i ordered

## 2012-07-23 NOTE — Telephone Encounter (Signed)
[facility] at [facility] called pt needs cxr order

## 2012-07-23 NOTE — Telephone Encounter (Signed)
It was done 2 days ago

## 2012-07-24 NOTE — Assessment & Plan Note (Signed)
Pressure has been down somewhat.

## 2012-07-24 NOTE — Assessment & Plan Note (Addendum)
Symptoms currently atypical----will follow closely. CXR neg.  Borderline D dimer.  Scan ordered by Dr. Loanne Drilling.

## 2012-07-26 ENCOUNTER — Telehealth: Payer: Self-pay | Admitting: Endocrinology

## 2012-07-26 ENCOUNTER — Encounter: Payer: Self-pay | Admitting: Gastroenterology

## 2012-07-26 ENCOUNTER — Telehealth: Payer: Self-pay | Admitting: Gastroenterology

## 2012-07-26 ENCOUNTER — Ambulatory Visit (INDEPENDENT_AMBULATORY_CARE_PROVIDER_SITE_OTHER): Payer: Medicare Other | Admitting: Gastroenterology

## 2012-07-26 VITALS — BP 112/70 | HR 68 | Ht 64.0 in | Wt 124.6 lb

## 2012-07-26 DIAGNOSIS — R110 Nausea: Secondary | ICD-10-CM | POA: Diagnosis not present

## 2012-07-26 DIAGNOSIS — R1013 Epigastric pain: Secondary | ICD-10-CM

## 2012-07-26 DIAGNOSIS — K219 Gastro-esophageal reflux disease without esophagitis: Secondary | ICD-10-CM | POA: Diagnosis not present

## 2012-07-26 MED ORDER — ESOMEPRAZOLE MAGNESIUM 40 MG PO CPDR: 40.0000 mg | DELAYED_RELEASE_CAPSULE | Freq: Every day | ORAL | Status: DC

## 2012-07-26 NOTE — Telephone Encounter (Signed)
Pt called to cancel new patient appt w/ Dr. Tomma Rakers. She did not wish to reschedule at this time / Sherri

## 2012-07-26 NOTE — Patient Instructions (Addendum)
You have been scheduled for an abdominal ultrasound at [facility] (1st floor of hospital) on 07-30-12 at 9:00am. Please arrive 15 minutes prior to your appointment for registration. Make certain not to have anything to eat or drink after midnight the night before your appointment. Should you need to reschedule your appointment, please contact radiology at [phone]. This test typically takes about 30 minutes to perform.  We have sent the following medications to your pharmacy for you to pick up at your convenience:  Nexium  Please follow up with Janett Billow in 3-4 weeks.

## 2012-07-26 NOTE — Telephone Encounter (Signed)
Pt states she has had terrible abdominal pain over the weekend. Pt thinks it may be her gallbladder. Requesting to be seen today. Pt scheduled to see Alonza Bogus PA today at 2:15pm. Pt aware of appt date and time.

## 2012-07-26 NOTE — Progress Notes (Deleted)
07/26/2012 IJA LEWY [id number] 03/21/1941   History of Present Illness:  ***  Current Medications, Allergies, Past Medical History, Past Surgical History, Family History and Social History were reviewed in [facility] record.   Physical Exam: BP 112/70  Pulse 68  Ht 5\' 4"  (1.626 m)  Wt 124 lb 9.6 oz (56.518 kg)  BMI 21.38 kg/m2  SpO2 98% General: Well developed, white female in no acute distress Head: Normocephalic and atraumatic Eyes:  sclerae anicteric, conjunctiva pink  Ears: Normal auditory acuity Lungs: Clear throughout to auscultation Heart: Regular rate and rhythm Abdomen: Soft, non-distended. No masses, no hepatomegaly. Normal bowel sounds.  Mild epigastric TTP without R/R/G. Musculoskeletal: Symmetrical with no gross deformities  Extremities: No edema  Neurological: Alert oriented x 4, grossly nonfocal Psychological:  Alert and cooperative. Normal mood and affect  Assessment and Recommendations: -Epigastric pain radiating into back and chest with nausea:  3 episodes.  Cardiac evaluation ok.  Suspect acid reflux, but also want to rule out gallbladder etiology.  Will start with RUQ ultrasound.  Will also start her on a course of PPI (says that her husband takes Nexium so we will give that to her as well).  She will follow-up in 3-4 weeks to reassess and decide on need for EGD or other evaluation.

## 2012-07-26 NOTE — Progress Notes (Signed)
07/26/2012 Katrina Yang GP:[id number] 1941/02/05   HISTORY OF PRESENT ILLNESS:  This is a 72 year old female who presents to our office today with complaints of epigastric abdominal pain with pain radiating to her upper back and left shoulder.  She says that over the last two months she has experienced 3 attacks of similar symptoms.  This is also associated with nausea.  On at least one occurrence she has noticed that it occurred after eating a large meal.  The most recent episode was this past Saturday.  She also has a lot of belching and when she burps she brings up acid substance.  Has some diarrhea with this as well.  She saw her PCP and her cardiologist; says that EKG and work up was fine from their standpoints.  CBC was normal, but no other labs were performed.  Never had any problems with her stomach in the past; never been on acid medication or had an EGD.   Past Medical History  Diagnosis Date  . Hypertension   . Palpitations   . Pure hypercholesterolemia   . CVA (cerebral vascular accident)     hx of  . Secondary hyperparathyroidism (of renal origin)   . Pulmonary nodule   . GERD (gastroesophageal reflux disease)   . Diverticulosis of colon (without mention of hemorrhage)   . Colonic polyp     hx of  . Degenerative joint disease   . Personal history of unspecified urinary disorder   . Osteoporosis, unspecified   . Unspecified hypothyroidism   . History of hysterectomy    Past Surgical History  Procedure Laterality Date  . Back surgery      diskectomy L-5. Multiple back surgeries  . Abdominal hysterectomy      reports that she has never smoked. She does not have any smokeless tobacco history on file. She reports that she does not drink alcohol or use illicit drugs. family history includes Colon cancer in her mother; Heart attack in her father; Heart disease in her father and mother; Kidney cancer in her father; and Ovarian cancer in her mother. Allergies  Allergen Reactions   . Codeine   . Iodine   . Iohexol   . Lovastatin   . Metronidazole     REACTION: Reaction not known  . Sulfamethoxazole W-Trimethoprim   . Sulfonamide Derivatives       Outpatient Encounter Prescriptions as of 07/26/2012  Medication Sig Dispense Refill  . aspirin 81 MG tablet Take 81 mg by mouth daily.        . calcitRIOL (ROCALTROL) 0.25 MCG capsule TAKE 1 CAPSULE DAILY  90 capsule  2  . clopidogrel (PLAVIX) 75 MG tablet Take 75 mg by mouth daily.        Marland Kitchen gabapentin (NEURONTIN) 100 MG capsule Take 100 mg by mouth 2 (two) times daily with a meal.      . levothyroxine (SYNTHROID, LEVOTHROID) 88 MCG tablet TAKE 1 TABLET ONCE DAILY  90 tablet  0  . lisinopril (PRINIVIL,ZESTRIL) 2.5 MG tablet Take 1 tablet (2.5 mg total) by mouth daily.  90 tablet  0  . metoprolol succinate (TOPROL-XL) 50 MG 24 hr tablet       . Omega-3 Fatty Acids (FISH OIL) 1200 MG CAPS Take 2 capsules by mouth 2 (two) times daily.       Marland Kitchen esomeprazole (NEXIUM) 40 MG capsule Take 1 capsule (40 mg total) by mouth daily before breakfast.  30 capsule  3   No facility-administered  encounter medications on file as of 07/26/2012.     REVIEW OF SYSTEMS: All other systems reviewed and negative except where noted in the History of Present Illness.   PHYSICAL EXAM: BP 112/70  Pulse 68  Ht 5\' 4"  (1.626 m)  Wt 124 lb 9.6 oz (56.518 kg)  BMI 21.38 kg/m2  SpO2 98% General: Well developed white female in no acute distress Head: Normocephalic and atraumatic Eyes:  Sclerae anicteric,conjunctive pink. Ears: Normal auditory acuity Lungs: Clear throughout to auscultation Heart: Regular rate and rhythm Abdomen: Soft, non-distended. No masses or hepatomegaly noted.  Normal bowel sounds.  Mild epigastric abdominal TTP without R/R/G. Musculoskeletal: Symmetrical with no gross deformities  Skin: No lesions on visible extremities Extremities: No edema  Neurological: Alert oriented x 4, grossly nonfocal Psychological:  Alert and  cooperative. Normal mood and affect  ASSESSMENT AND PLAN: -Epigastric pain radiating into back and chest with nausea:  3 episodes.  Cardiac evaluation ok.  Suspect acid reflux, but also want to rule out gallbladder etiology.  Will start with RUQ ultrasound.  Will also start her on a course of PPI (says that her husband takes Nexium so we will give that to her as well).  She will follow GERD diet as well; advised her to eat small meals and to not eat at least 3 hours before bed.  She will follow-up in 3-4 weeks to reassess and decide on need for EGD (should probably have EGD if ultrasound is ok since new onset of symptoms in a 72 year old) or other evaluation.

## 2012-07-27 DIAGNOSIS — I129 Hypertensive chronic kidney disease with stage 1 through stage 4 chronic kidney disease, or unspecified chronic kidney disease: Secondary | ICD-10-CM | POA: Diagnosis not present

## 2012-07-27 DIAGNOSIS — N183 Chronic kidney disease, stage 3 unspecified: Secondary | ICD-10-CM | POA: Diagnosis not present

## 2012-07-27 NOTE — Progress Notes (Signed)
Reviewed and agree with management. Robert D. Kaplan, M.D., FACG  

## 2012-07-28 ENCOUNTER — Ambulatory Visit: Payer: Medicare Other | Admitting: Neurology

## 2012-07-29 ENCOUNTER — Ambulatory Visit (HOSPITAL_COMMUNITY)
Admission: RE | Admit: 2012-07-29 | Discharge: 2012-07-29 | Disposition: A | Discharge status: Home / Self Care | Payer: Medicare Other | Source: Ambulatory Visit | Attending: Endocrinology | Admitting: Endocrinology

## 2012-07-29 ENCOUNTER — Telehealth: Payer: Self-pay | Admitting: Gastroenterology

## 2012-07-29 ENCOUNTER — Encounter (HOSPITAL_COMMUNITY)
Admission: RE | Admit: 2012-07-29 | Discharge: 2012-07-29 | Disposition: A | Discharge status: Home / Self Care | Payer: Medicare Other | Source: Ambulatory Visit | Attending: Endocrinology | Admitting: Endocrinology

## 2012-07-29 DIAGNOSIS — M25519 Pain in unspecified shoulder: Secondary | ICD-10-CM | POA: Diagnosis not present

## 2012-07-29 DIAGNOSIS — R0602 Shortness of breath: Secondary | ICD-10-CM | POA: Insufficient documentation

## 2012-07-29 DIAGNOSIS — R079 Chest pain, unspecified: Secondary | ICD-10-CM | POA: Insufficient documentation

## 2012-07-29 DIAGNOSIS — I10 Essential (primary) hypertension: Secondary | ICD-10-CM | POA: Diagnosis not present

## 2012-07-29 DIAGNOSIS — R0600 Dyspnea, unspecified: Secondary | ICD-10-CM

## 2012-07-29 DIAGNOSIS — M549 Dorsalgia, unspecified: Secondary | ICD-10-CM | POA: Insufficient documentation

## 2012-07-29 MED ORDER — TECHNETIUM TO 99M ALBUMIN AGGREGATED
5.0000 | Freq: Once | INTRAVENOUS | Status: AC | PRN
  Administered 2012-07-29: 5 via INTRAVENOUS

## 2012-07-29 MED ORDER — ESOMEPRAZOLE MAGNESIUM 40 MG PO CPDR: 40.0000 mg | DELAYED_RELEASE_CAPSULE | Freq: Every day | ORAL | Status: DC

## 2012-07-29 MED ORDER — TECHNETIUM TC 99M DIETHYLENETRIAME-PENTAACETIC ACID
45.0000 | Freq: Once | INTRAVENOUS | Status: AC | PRN
  Administered 2012-07-29: 45 via INTRAVENOUS

## 2012-07-29 NOTE — Telephone Encounter (Signed)
Patient was seen on 07/26/12.  She said that Janett Billow was going to call her a prescription in for Nexium.  She would like it called in to [facility] on [address] in [place].  She is on her way back there now.

## 2012-07-30 ENCOUNTER — Ambulatory Visit (HOSPITAL_COMMUNITY)
Admission: RE | Admit: 2012-07-30 | Discharge: 2012-07-30 | Disposition: A | Discharge status: Home / Self Care | Payer: Medicare Other | Source: Ambulatory Visit | Attending: Gastroenterology | Admitting: Gastroenterology

## 2012-07-30 DIAGNOSIS — N269 Renal sclerosis, unspecified: Secondary | ICD-10-CM | POA: Insufficient documentation

## 2012-07-30 DIAGNOSIS — R110 Nausea: Secondary | ICD-10-CM

## 2012-07-30 DIAGNOSIS — R1013 Epigastric pain: Secondary | ICD-10-CM | POA: Diagnosis not present

## 2012-07-30 DIAGNOSIS — K219 Gastro-esophageal reflux disease without esophagitis: Secondary | ICD-10-CM

## 2012-07-30 DIAGNOSIS — N281 Cyst of kidney, acquired: Secondary | ICD-10-CM | POA: Diagnosis not present

## 2012-08-03 ENCOUNTER — Other Ambulatory Visit: Payer: Self-pay

## 2012-08-03 MED ORDER — LEVOTHYROXINE SODIUM 88 MCG PO TABS: ORAL_TABLET | ORAL | Status: DC

## 2012-08-05 ENCOUNTER — Telehealth: Payer: Self-pay

## 2012-08-05 NOTE — Telephone Encounter (Signed)
Pt requesting earlier appt, says she thinks she has had another mild stroke. Visited PCP says was informed to contact neurologist w/ appt.  Sched earlier appt.

## 2012-08-10 ENCOUNTER — Encounter: Payer: Self-pay | Admitting: Diagnostic Neuroimaging

## 2012-08-10 ENCOUNTER — Ambulatory Visit (INDEPENDENT_AMBULATORY_CARE_PROVIDER_SITE_OTHER): Payer: Medicare Other | Admitting: Diagnostic Neuroimaging

## 2012-08-10 VITALS — BP 130/69 | HR 64 | Ht 63.5 in | Wt 127.0 lb

## 2012-08-10 DIAGNOSIS — R200 Anesthesia of skin: Secondary | ICD-10-CM

## 2012-08-10 DIAGNOSIS — R209 Unspecified disturbances of skin sensation: Secondary | ICD-10-CM

## 2012-08-10 DIAGNOSIS — M542 Cervicalgia: Secondary | ICD-10-CM

## 2012-08-10 DIAGNOSIS — M79609 Pain in unspecified limb: Secondary | ICD-10-CM

## 2012-08-10 DIAGNOSIS — M25519 Pain in unspecified shoulder: Secondary | ICD-10-CM

## 2012-08-10 DIAGNOSIS — M25512 Pain in left shoulder: Secondary | ICD-10-CM

## 2012-08-10 DIAGNOSIS — M79605 Pain in left leg: Secondary | ICD-10-CM

## 2012-08-10 NOTE — Patient Instructions (Signed)
I will check MRI of the brain and cervical spine for further evaluation. Continue your current medications. May try Tylenol as needed over-the-counter for pain relief. We may consider referral to pain management clinic in the future.

## 2012-08-10 NOTE — Progress Notes (Signed)
[facility]  PATIENT: Katrina Yang DOB: 11/27/1940  REFERRING CLINICIAN: Loanne Drilling HISTORY FROM: patient REASON FOR VISIT: follow up   HISTORICAL  CHIEF COMPLAINT:  Chief Complaint  Patient presents with  . Cerebrovascular Accident    pt thinks she has had a second stroke    HISTORY OF PRESENT ILLNESS:   UPDATE 08/10/12: Since last visit, patient developed recurrence of pain and numbness around the beginning of March 2014. Patient woke up one night and felt increasing pain in her left neck, left shoulder, left arm and left flank. She also has increasing numbness in her left face, left arm and left leg. Some of these symptoms are similar to symptoms she had in 2003. 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 Patient was treated with risk factor management and gabapentin. Patient is concerned that the recent symptoms may represent another spinal cord infarction. Patient has seen her PCP, cardiologist and GI specialist recently for evaluation of these symptoms as well.  PRIOR HPI (03/19/12, Dr. 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 She was evaluated for vertigo with MRI of the brain without contrast 11/24/2007 showing scattered punctate nonspecific white matter hyperintensities some in the pons and thalamic region which were nonspecific but most consistent with small vessel disease. Intracranial MRA without contrast 11/24/2007 was normal. MRA of the neck 11/24/2007 was suboptimal and raised the question of stenosis of the proximal segment of the right vertebral artery. 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 A repeat  doppler 2012 by Dr. Lia Foyer showed "no change" Last EEG 12/09/2004 was normal., She continues to have pain in her left leg and after lying down it is better. 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 Since 2003 she has pain that starts in her back on the left side at the shoulder blade and goes behind the left leg. This is improved with  gabapentin or with steroids. She describes the pain as sharp and constant. It is worse when she is on her feet or sitting.  It is not as bad while walking.It is best when she is off her feet. There is no change with bending over, cough, sneeze, or Valsalva maneuver. 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 Gabapentin helps her pain but higher doses cause sleepiness. She has numbness in her left foot on the bottom on the plantar surface in the S1 distribution She has no symptoms in her right leg. She says "I can live with it". Her last MRI of the cervical spine with and without contrast 2/7/2007showed spondylosis from C3-4 through C6-7 with moderate canal narrowing and neuroforaminal stenosis left greater than right at C4-5 and C5-6 there was diffuse degenerative bulging from C3-4 through C6-7. The saggital spine could be viewed through T5. Her last MRI of the lumbar spine 07/2003 showed postoperative changes at L5-S1 and possibly recurrent disc at L5-S1.  Her TIAs have been unusual some  right brain with stroke causing left-sided weakness in 2003. She has been readmitted for slurred speech and left-sided weaknes as well. She walks 30 minutes every other day. Her husband unfortunately has CML and  myasthenia gravis. She has numbness in the left hip, buttock, leg, and foot. She has one fall every 2 or  3 months. At times she cannot wear a shoe on the left foot because of pain. Her last thoracic MRI 12/1/2011was normal. She has elevated creatinines. She is seeing Dr. Clover Mealy, nephrologist who started her on lisinopril. 10/24/2011. She is now seen Dr. Jonathon Bellows at [facility]. The patient had head trauma when a statue fell striking her head 09/2011 without loss of consciousness, a metal flower stand struck her in the head requiring an ER visit with stitches 09/2011, she struck her head in the edge of the door frame, the  last day of school 10/10/2011, and she fell backwards without head trauma 10/17/2011. She is having neck pain which goes to her left shoulder. Last blood work was 01/04/2012. At times the  left side of her head hurts."   REVIEW OF SYSTEMS: Full 14 system review of systems performed and notable only for easy bruising easy bleeding diarrhea constipation urination problems feeling hot and cold headache numbness weakness dizziness.  ALLERGIES: Allergies  Allergen Reactions  . Codeine   . Iodine   . Iohexol   . Lovastatin   . Metronidazole     REACTION: Reaction not known  . Sulfamethoxazole W-Trimethoprim   . Sulfonamide Derivatives     HOME MEDICATIONS: Outpatient Prescriptions Prior to Visit  Medication Sig Dispense Refill  . aspirin 81 MG tablet Take 81 mg by mouth daily.        . calcitRIOL (ROCALTROL) 0.25 MCG capsule TAKE 1 CAPSULE DAILY  90 capsule  2  . clopidogrel (PLAVIX) 75 MG tablet Take 75 mg by mouth daily.        Marland Kitchen esomeprazole (NEXIUM) 40 MG capsule Take 1 capsule (40 mg total) by mouth daily before breakfast.  90 capsule  3  . gabapentin (NEURONTIN) 100 MG capsule Take 100 mg by mouth 2 (two) times daily with a meal.      . levothyroxine (SYNTHROID, LEVOTHROID) 88 MCG tablet TAKE 1 TABLET ONCE DAILY  90 tablet  0  . lisinopril (PRINIVIL,ZESTRIL) 2.5 MG tablet Take 1 tablet (2.5 mg total) by mouth daily.  90 tablet  0  . metoprolol succinate (TOPROL-XL) 50 MG 24 hr tablet        . Omega-3 Fatty Acids (FISH OIL) 1200 MG CAPS Take 2 capsules by mouth 2 (two) times daily.        No facility-administered medications prior to visit.    PAST MEDICAL HISTORY: Past Medical History  Diagnosis Date  . Hypertension   . Palpitations   . Pure hypercholesterolemia   . CVA (cerebral vascular accident)     hx of  . Secondary hyperparathyroidism (of renal origin)   . Pulmonary nodule   . GERD (gastroesophageal reflux disease)   . Diverticulosis of colon (without mention of hemorrhage)   . Colonic polyp     hx of  . Degenerative joint disease   . Personal history of unspecified urinary disorder   . Osteoporosis, unspecified   . Unspecified hypothyroidism   . History of hysterectomy     PAST SURGICAL HISTORY: Past Surgical History  Procedure Laterality Date  . Back surgery  diskectomy L-5. Multiple back surgeries  . Abdominal hysterectomy      FAMILY HISTORY: Family History  Problem Relation Age of Onset  . Kidney cancer Father   . Heart attack Father   . Heart disease Father   . Colon cancer Mother   . Ovarian cancer Mother   . Heart disease Mother   . Heart failure Mother     SOCIAL HISTORY:  History   Social History  . Marital Status: Married    Spouse Name: [name]    Number of Children: 1  . Years of Education: 2   Occupational History  . Metallurgist    Social History Main Topics  . Smoking status: Never Smoker   . Smokeless tobacco: Not on file  . Alcohol Use: No  . Drug Use: No  . Sexually Active: Not on file   Other Topics Concern  . Not on file   Social History Narrative   Pt lives at home with her spouse.   She consumes 2-3 glasses of caffeine.     PHYSICAL EXAM  Filed Vitals:   08/10/12 1204  BP: 130/69  Pulse: 64  Height: 5' 3.5" (1.613 m)  Weight: 127 lb (57.607 kg)   Body mass index is 22.14 kg/(m^2).  GENERAL EXAM: Patient is in no distress  CARDIOVASCULAR: Regular rate and rhythm, no murmurs, no  carotid bruits  NEUROLOGIC: MENTAL STATUS: awake, alert, language fluent, comprehension intact, naming intact CRANIAL NERVE: no papilledema on fundoscopic exam, pupils equal and reactive to light, visual fields full to confrontation, extraocular muscles intact, no nystagmus, facial sensation and strength symmetric, uvula midline, shoulder shrug symmetric, tongue midline. MOTOR: LUE AND LLE WEAKNESS (GIVEWAY AND FLUCTUATING).  SENSORY: SUBJECTIVE PAIN IN LEFT ARM AND LEFT LEG.  COORDINATION: finger-nose-finger, fine finger movements normal REFLEXES: BUE 1, KNEE 1, RIGHT ANKLE 1, LEFT ANKLE 0. GAIT/STATION: narrow based gait   DIAGNOSTIC DATA (LABS, IMAGING, TESTING) - I reviewed patient records, labs, notes, testing and imaging myself where available.  Lab Results  Component Value Date   WBC 6.7 07/21/2012   HGB 11.9* 07/21/2012   HCT 36.5 07/21/2012   MCV 90.9 07/21/2012   PLT 245.0 07/21/2012      Component Value Date/Time   NA 141 01/14/2012 0910   K 4.8 01/14/2012 0910   CL 107 01/14/2012 0910   CO2 26 01/14/2012 0910   GLUCOSE 89 01/14/2012 0910   BUN 38* 01/14/2012 0910   CREATININE 1.9* 01/14/2012 0910   CALCIUM 9.4 01/14/2012 0910   CALCIUM 9.5 01/14/2012 0910   PROT 7.5 01/14/2012 0910   ALBUMIN 4.0 01/14/2012 0910   AST 31 01/14/2012 0910   ALT 20 01/14/2012 0910   ALKPHOS 65 01/14/2012 0910   BILITOT 0.6 01/14/2012 0910   GFRNONAA 42.50 12/04/2009 0831   GFRAA  Value: 41        The eGFR has been calculated using the MDRD equation. This calculation has not been validated in all clinical situations. eGFR's persistently <60 mL/min signify possible Chronic Kidney Disease.* 07/09/2009 1449   Lab Results  Component Value Date   CHOL 264* 01/14/2012   HDL 81.60 01/14/2012   LDLCALC  Value: 122        Total Cholesterol/HDL:CHD Risk Coronary Heart Disease Risk Table                     Men   Women  1/2 Average Risk   3.4   3.3  Average Risk  5.0   4.4  2 X Average Risk   9.6   7.1  3 X  Average Risk  23.4   11.0        Use the calculated Patient Ratio above and the CHD Risk Table to determine the patient's CHD Risk.        ATP III CLASSIFICATION (LDL):  <100     mg/dL   Optimal  100-129  mg/dL   Near or Above                    Optimal  130-159  mg/dL   Borderline  160-189  mg/dL   High  >190     mg/dL   Very High* 07/09/2009   LDLDIRECT 155.1 01/14/2012   TRIG 73.0 01/14/2012   CHOLHDL 3 01/14/2012   No results found for this basename: HGBA1C   No results found for this basename: VITAMINB12   Lab Results  Component Value Date   TSH 0.61 01/14/2012     ASSESSMENT AND PLAN  72 y.o. year old female  has a past medical history of Hypertension; Palpitations; Pure hypercholesterolemia; CVA (cerebral vascular accident); Secondary hyperparathyroidism (of renal origin); Pulmonary nodule; GERD (gastroesophageal reflux disease); Diverticulosis of colon (without mention of hemorrhage); Colonic polyp; Degenerative joint disease; Personal history of unspecified urinary disorder; Osteoporosis, unspecified; Unspecified hypothyroidism; and History of hysterectomy. here with increasing left face, left arm left leg numbness and pain in her left neck, left shoulder left arm and left leg. Symptoms are similar to what she has had in the past, and seemed to have fluctuated over the past 10 years. Due to her risk factors and prior history of possible TIA and possible spinal cord infarction, I will check MRI of the brain and cervical spine again. She's currently on aspirin, Plavix, blood pressure medications for secondary stroke prevention and risk factor management. I would request her PCP to screen for diabetes and hyperlipidemia. In terms of symptom control, she may try a low-dose Tylenol over-the-counter. Otherwise we may consider referral to pain management clinic for consideration of trigger point injections or other pain medications. She's not able to take NSAIDs do to her kidney disease.   Orders  Placed This Encounter  Procedures  . MR Brain Wo Contrast  . MR Cervical Spine Wo Contrast    Penni Bombard, MD [id number], 0000000 PM Certified in Neurology, Neurophysiology and [facility] [address] [place], [place] [address] [phone]

## 2012-08-19 ENCOUNTER — Ambulatory Visit (INDEPENDENT_AMBULATORY_CARE_PROVIDER_SITE_OTHER): Payer: Medicare Other | Admitting: Cardiology

## 2012-08-19 ENCOUNTER — Encounter: Payer: Self-pay | Admitting: Cardiology

## 2012-08-19 VITALS — BP 172/88 | HR 61 | Ht 64.0 in | Wt 126.0 lb

## 2012-08-19 DIAGNOSIS — I10 Essential (primary) hypertension: Secondary | ICD-10-CM | POA: Diagnosis not present

## 2012-08-19 DIAGNOSIS — I6350 Cerebral infarction due to unspecified occlusion or stenosis of unspecified cerebral artery: Secondary | ICD-10-CM | POA: Diagnosis not present

## 2012-08-19 DIAGNOSIS — J984 Other disorders of lung: Secondary | ICD-10-CM

## 2012-08-19 DIAGNOSIS — N183 Chronic kidney disease, stage 3 unspecified: Secondary | ICD-10-CM

## 2012-08-19 DIAGNOSIS — R0989 Other specified symptoms and signs involving the circulatory and respiratory systems: Secondary | ICD-10-CM

## 2012-08-19 NOTE — Patient Instructions (Signed)
Your physician recommends that you schedule a follow-up appointment in: 4 MONTHS with Dr Aundra Dubin (previous pt of Dr Lia Foyer)  Your physician recommends that you continue on your current medications as directed. Please refer to the Current Medication list given to you today.

## 2012-08-20 ENCOUNTER — Telehealth: Payer: Self-pay | Admitting: Cardiology

## 2012-08-20 ENCOUNTER — Ambulatory Visit
Admission: RE | Admit: 2012-08-20 | Discharge: 2012-08-20 | Disposition: A | Discharge status: Home / Self Care | Payer: Medicare Other | Source: Ambulatory Visit | Attending: Diagnostic Neuroimaging | Admitting: Diagnostic Neuroimaging

## 2012-08-20 DIAGNOSIS — M79605 Pain in left leg: Secondary | ICD-10-CM

## 2012-08-20 DIAGNOSIS — L0390 Cellulitis, unspecified: Secondary | ICD-10-CM | POA: Diagnosis not present

## 2012-08-20 DIAGNOSIS — M542 Cervicalgia: Secondary | ICD-10-CM

## 2012-08-20 DIAGNOSIS — R200 Anesthesia of skin: Secondary | ICD-10-CM

## 2012-08-20 DIAGNOSIS — L0291 Cutaneous abscess, unspecified: Secondary | ICD-10-CM | POA: Diagnosis not present

## 2012-08-20 DIAGNOSIS — M25512 Pain in left shoulder: Secondary | ICD-10-CM

## 2012-08-20 DIAGNOSIS — R209 Unspecified disturbances of skin sensation: Secondary | ICD-10-CM | POA: Diagnosis not present

## 2012-08-20 NOTE — Assessment & Plan Note (Signed)
Minor bilateral plaque, no acute findings.  FU at 2 years was recommended.

## 2012-08-20 NOTE — Telephone Encounter (Signed)
I spoke with the pt and she just left [facility] in [place] (Urgent Care).  The pt was seen for a tooth abscess and prescribed an antibiotic.  The pt does have a tooth ache and also underwent an MRI this morning due to neck pain.  The pt's BP was elevated during her office visit yesterday (172/88).  No changes were recommended by Dr Lia Foyer and she was instructed to monitor BP.  I made the pt aware that pain can cause BP to increase.  The pt will continue to monitor BP, start antibiotic and rest over the weekend.  If the pt has any other questions or concerns she will call the on-call provider. Pt agreed with plan.

## 2012-08-20 NOTE — Progress Notes (Signed)
HPI:  This very nice patient returns in a followup visit. From a cardiac standpoint, she is stable. She's had some kidney issues, and her ultrasound is consistent with medical renal disease bilaterally. She's feeling better than she did when last saw her. Her blood pressures appear to be somewhat better improved. She's had some neck and head pain iand her energy level is not good and she is scheduled to have an MRI tomorrow that the neurologist  Scheduled.  Current Outpatient Prescriptions  Medication Sig Dispense Refill  . aspirin 81 MG tablet Take 81 mg by mouth daily.        . calcitRIOL (ROCALTROL) 0.25 MCG capsule TAKE 1 CAPSULE DAILY  90 capsule  2  . clopidogrel (PLAVIX) 75 MG tablet Take 75 mg by mouth daily.        Marland Kitchen esomeprazole (NEXIUM) 40 MG capsule Take 1 capsule (40 mg total) by mouth daily before breakfast.  90 capsule  3  . gabapentin (NEURONTIN) 100 MG capsule Take 100 mg by mouth 2 (two) times daily with a meal.      . levothyroxine (SYNTHROID, LEVOTHROID) 88 MCG tablet TAKE 1 TABLET ONCE DAILY  90 tablet  0  . lisinopril (PRINIVIL,ZESTRIL) 2.5 MG tablet Take 1 tablet (2.5 mg total) by mouth daily.  90 tablet  0  . metoprolol succinate (TOPROL-XL) 50 MG 24 hr tablet        No current facility-administered medications for this visit.    Allergies  Allergen Reactions  . Codeine   . Iodine   . Iohexol   . Lovastatin   . Metronidazole     REACTION: Reaction not known  . Sulfamethoxazole W-Trimethoprim   . Sulfonamide Derivatives     Past Medical History  Diagnosis Date  . Hypertension   . Palpitations   . Pure hypercholesterolemia   . CVA (cerebral vascular accident)     hx of  . Secondary hyperparathyroidism (of renal origin)   . Pulmonary nodule   . GERD (gastroesophageal reflux disease)   . Diverticulosis of colon (without mention of hemorrhage)   . Colonic polyp     hx of  . Degenerative joint disease   . Personal history of unspecified urinary disorder    . Osteoporosis, unspecified   . Unspecified hypothyroidism   . History of hysterectomy     Past Surgical History  Procedure Laterality Date  . Back surgery      diskectomy L-5. Multiple back surgeries  . Abdominal hysterectomy      Family History  Problem Relation Age of Onset  . Kidney cancer Father   . Heart attack Father   . Heart disease Father   . Colon cancer Mother   . Ovarian cancer Mother   . Heart disease Mother   . Heart failure Mother     History   Social History  . Marital Status: Married    Spouse Name: [name]    Number of Children: 1  . Years of Education: 2   Occupational History  . Metallurgist    Social History Main Topics  . Smoking status: Never Smoker   . Smokeless tobacco: Not on file  . Alcohol Use: No  . Drug Use: No  . Sexually Active: Not on file   Other Topics Concern  . Not on file   Social History Narrative   Pt lives at home with her spouse.   She consumes 2-3 glasses of caffeine.    ROS: Please  see the HPI.  All other systems reviewed and negative.  PHYSICAL EXAM:  BP 172/88  Pulse 61  Ht 5\' 4"  (1.626 m)  Wt 126 lb (57.153 kg)  BMI 21.62 kg/m2  SpO2 97%  General: Well developed, well nourished, in no acute distress. Head:  Normocephalic and atraumatic. Neck: no JVD Lungs: Clear to auscultation and percussion. Heart: Normal S1 and S2.  No murmur, rubs or gallops.  Abdomen:  Normal bowel sounds; soft; non tender; no organomegaly Pulses: Pulses normal in all 4 extremities. Extremities: No clubbing or cyanosis. No edema. Neurologic: Alert and oriented x 3.  EKG:  Non today  ASSESSMENT AND PLAN:

## 2012-08-20 NOTE — Telephone Encounter (Signed)
New problem    Pt calling to report b/p 170/100

## 2012-08-20 NOTE — Assessment & Plan Note (Signed)
Completed fu CT scans in 2010.

## 2012-08-20 NOTE — Assessment & Plan Note (Signed)
Recent cr have been modestly elevated.  She is being followed by the nephrology team at [facility].

## 2012-08-20 NOTE — Assessment & Plan Note (Signed)
Her current BPs appear to be ideally controlled at present.  She brought in her record sheet and it looks satisfactory.

## 2012-08-20 NOTE — Assessment & Plan Note (Signed)
Was placed on DAPT for this event.  Remains on this.  Apparently getting an additional MRI from Dr. Tressia Danas replacement.

## 2012-08-23 ENCOUNTER — Encounter: Payer: Self-pay | Admitting: Neurology

## 2012-08-30 ENCOUNTER — Telehealth: Payer: Self-pay | Admitting: Cardiology

## 2012-08-30 NOTE — Telephone Encounter (Signed)
Pt advised.

## 2012-08-30 NOTE — Telephone Encounter (Signed)
Pt states her husband checked her BP about 1 and 1/2 hours after taking meds (lisinorpil 2.5mg  and Toprol XL 25mg )  and eating. It was 74/52. Her BP has gradually increased and is now 110/69. Pt states her BP does tend to vary, and she does have renal disease, but this is the lowest it has been recently.  I will forward to Dr Aundra Dubin for review.

## 2012-08-30 NOTE — Telephone Encounter (Signed)
Verbalized understanding

## 2012-08-30 NOTE — Telephone Encounter (Signed)
New problem      C/O blood pressure today  74/52. Sitting  Weak.

## 2012-08-30 NOTE — Telephone Encounter (Signed)
Spoke with patient. Pt states BP first thing this morning was 124/81.

## 2012-08-30 NOTE — Telephone Encounter (Signed)
Would separate when she takes the two medications and keep following BP.  If commonly gets low like that, will have to cut back.

## 2012-09-01 ENCOUNTER — Encounter: Payer: Self-pay | Admitting: Gastroenterology

## 2012-09-01 ENCOUNTER — Ambulatory Visit (INDEPENDENT_AMBULATORY_CARE_PROVIDER_SITE_OTHER): Payer: Medicare Other | Admitting: Gastroenterology

## 2012-09-01 VITALS — BP 150/80 | HR 68 | Ht 61.42 in | Wt 125.5 lb

## 2012-09-01 DIAGNOSIS — R1013 Epigastric pain: Secondary | ICD-10-CM | POA: Diagnosis not present

## 2012-09-01 MED ORDER — ESOMEPRAZOLE MAGNESIUM 40 MG PO CPDR: 40.0000 mg | DELAYED_RELEASE_CAPSULE | Freq: Every day | ORAL | Status: DC

## 2012-09-01 NOTE — Patient Instructions (Addendum)
We are sending your Nexium to your mail order pharmacy

## 2012-09-01 NOTE — Progress Notes (Signed)
History of Present Illness:  Katrina Yang has returned for followup of abdominal pain and pyrosis. Symptoms have resolved since starting Nexium. Her only GI complaint is mild coughing while  she eats.  She denies choking or dysphagia. Stools are slightly softer since starting Nexium. Abdominal ultrasound was negative.    Review of Systems: Pertinent positive and negative review of systems were noted in the above HPI section. All other review of systems were otherwise negative.    Current Medications, Allergies, Past Medical History, Past Surgical History, Family History and Social History were reviewed in [place] record  Vital signs were reviewed in today's medical record. Physical Exam: General: Well developed , well nourished, no acute distress

## 2012-09-01 NOTE — Assessment & Plan Note (Addendum)
Symptoms of dyspepsia and pain are probably related to GERD. She is improving with Nexium. Ultrasound was negative.  Plan to continue Nexium

## 2012-10-12 ENCOUNTER — Other Ambulatory Visit: Payer: Self-pay

## 2012-10-12 MED ORDER — LEVOTHYROXINE SODIUM 88 MCG PO TABS: ORAL_TABLET | ORAL | Status: DC

## 2012-11-08 ENCOUNTER — Ambulatory Visit: Payer: Self-pay | Admitting: Diagnostic Neuroimaging

## 2012-11-11 ENCOUNTER — Encounter: Payer: Self-pay | Admitting: Diagnostic Neuroimaging

## 2012-11-11 ENCOUNTER — Other Ambulatory Visit: Payer: Self-pay | Admitting: Neurology

## 2012-11-11 ENCOUNTER — Ambulatory Visit (INDEPENDENT_AMBULATORY_CARE_PROVIDER_SITE_OTHER): Payer: Medicare Other | Admitting: Diagnostic Neuroimaging

## 2012-11-11 VITALS — BP 134/75 | HR 64 | Ht 63.5 in | Wt 126.5 lb

## 2012-11-11 DIAGNOSIS — M79609 Pain in unspecified limb: Secondary | ICD-10-CM

## 2012-11-11 DIAGNOSIS — M25512 Pain in left shoulder: Secondary | ICD-10-CM

## 2012-11-11 DIAGNOSIS — R200 Anesthesia of skin: Secondary | ICD-10-CM

## 2012-11-11 DIAGNOSIS — R209 Unspecified disturbances of skin sensation: Secondary | ICD-10-CM | POA: Diagnosis not present

## 2012-11-11 DIAGNOSIS — M25519 Pain in unspecified shoulder: Secondary | ICD-10-CM

## 2012-11-11 DIAGNOSIS — M79605 Pain in left leg: Secondary | ICD-10-CM

## 2012-11-11 MED ORDER — GABAPENTIN 100 MG PO CAPS: 100.0000 mg | ORAL_CAPSULE | Freq: Two times a day (BID) | ORAL | Status: DC

## 2012-11-11 NOTE — Patient Instructions (Signed)
Follow up as needed

## 2012-11-11 NOTE — Progress Notes (Signed)
[facility]  PATIENT: Katrina Yang DOB: [birth year]  REFERRING CLINICIAN: Loanne Drilling HISTORY FROM: patient REASON FOR VISIT: follow up   HISTORICAL  CHIEF COMPLAINT:  Chief Complaint  Patient presents with  . Follow-up    HISTORY OF PRESENT ILLNESS:   UPDATE 11/11/12: Since last visit, doing well. Pain is stable. Using gabapentin 100mg  BID. No new events. MRI results reviewed with patient.  UPDATE 08/10/12: Since last visit, patient developed recurrence of pain and numbness around the beginning of March 2014. Patient woke up one night and felt increasing pain in her left neck, left shoulder, left arm and left flank. She also has increasing numbness in her left face, left arm and left leg. Some of these symptoms are similar to symptoms she had in 2003. 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 Patient was treated with risk factor management and gabapentin. Patient is concerned that the recent symptoms may represent another spinal cord infarction. Patient has seen her PCP, cardiologist and GI specialist recently for evaluation of these symptoms as well.  PRIOR HPI (03/19/12, Dr. 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 She was evaluated for vertigo with MRI of the brain without contrast 11/24/2007 showing scattered punctate nonspecific white matter hyperintensities some in the pons and thalamic region which were nonspecific but most consistent with small vessel disease. Intracranial MRA without contrast 11/24/2007 was normal. MRA of the neck 11/24/2007 was suboptimal and raised the question of stenosis of the proximal segment of the right vertebral artery. it was performed without  contrast because of a  creatinine of 1.15  Doppler of  the carotids 12/10/2004 showed 40-60% stenosis of the right ICA, no significant ICA stenosis, and vertebral flow antegrade. A repeat  doppler 2012 by Dr. Lia Foyer showed "no change" Last EEG 12/09/2004 was normal., She continues to have pain in her left leg and after lying down it is better. 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 Since 2003 she has pain that starts in her back on the left side at the shoulder blade and goes behind the left leg. This is improved with  gabapentin or with steroids. She describes the pain as sharp and constant. It is worse when she is on her feet or sitting.  It is not as bad while walking.It is best when she is off her feet. There is no change with bending over, cough, sneeze, or Valsalva maneuver. 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 Gabapentin helps her pain but higher doses cause sleepiness. She has numbness in her left foot on the bottom on the plantar surface in the S1 distribution She has no symptoms in her right leg. She says "I can live with it". Her last MRI of the cervical spine with and without contrast 2/7/2007showed spondylosis from C3-4 through C6-7 with moderate canal narrowing and neuroforaminal stenosis left greater than right at C4-5 and C5-6 there was diffuse degenerative bulging from C3-4 through C6-7. The saggital spine could be viewed through T5. Her last MRI of the lumbar spine 07/2003  showed postoperative changes at L5-S1 and possibly recurrent disc at L5-S1. Her TIAs have been unusual some  right brain with stroke causing left-sided weakness in 2003. She has been readmitted for slurred speech and left-sided weaknes as well. She walks 30 minutes every other day. Her husband unfortunately has CML and  myasthenia  gravis. She has numbness in the left hip, buttock, leg, and foot. She has one fall every 2 or 3 months. At times she cannot wear a shoe on the left foot because of pain. Her last thoracic MRI 12/1/2011was normal. She has elevated creatinines. She is seeing Dr. Clover Mealy, nephrologist who started her on lisinopril. 10/24/2011. She is now seen Dr. Jonathon Bellows at [facility]. The patient had head trauma when a statue fell striking her head 09/2011 without loss of consciousness, a metal flower stand struck her in the head requiring an ER visit with stitches 09/2011, she struck her head in the edge of the door frame, the  last day of school 10/10/2011, and she fell backwards without head trauma 10/17/2011. She is having neck pain which goes to her left shoulder. Last blood work was 01/04/2012. At times the  left side of her head hurts."   REVIEW OF SYSTEMS: Full 14 system review of systems performed and notable only for easy bruising easy bleeding diarrhea constipation urination problems feeling hot and cold headache numbness weakness dizziness insomnia, restless legs.  ALLERGIES: Allergies  Allergen Reactions  . Codeine   . Iodine   . Iohexol   . Lovastatin   . Metronidazole     REACTION: Reaction not known  . Sulfamethoxazole W-Trimethoprim   . Sulfonamide Derivatives     HOME MEDICATIONS: Outpatient Prescriptions Prior to Visit  Medication Sig Dispense Refill  . aspirin 81 MG tablet Take 81 mg by mouth daily.        . calcitRIOL (ROCALTROL) 0.25 MCG capsule TAKE 1 CAPSULE DAILY  90 capsule  2  . clopidogrel (PLAVIX) 75 MG tablet Take 75 mg by mouth daily.        Marland Kitchen levothyroxine (SYNTHROID, LEVOTHROID) 88 MCG tablet TAKE 1 TABLET ONCE DAILY  90 tablet  3  . lisinopril (PRINIVIL,ZESTRIL) 2.5 MG tablet Take 1 tablet (2.5 mg total) by mouth daily.  90 tablet  0  . metoprolol succinate (TOPROL-XL) 50 MG 24 hr tablet       . gabapentin (NEURONTIN) 100 MG capsule Take 100 mg by mouth 2 (two) times daily with a meal.       . esomeprazole (NEXIUM) 40 MG capsule Take 1 capsule (40 mg total) by mouth daily before breakfast.  90 capsule  3  . esomeprazole (NEXIUM) 40 MG capsule Take 1 capsule (40 mg total) by mouth daily before breakfast.  90 capsule  3   No facility-administered medications prior to visit.    PAST MEDICAL HISTORY: Past Medical History  Diagnosis Date  . Hypertension   . Palpitations   . Pure hypercholesterolemia   . CVA (cerebral vascular accident)     hx of  . Secondary hyperparathyroidism (of renal origin)   . Pulmonary nodule   . GERD (gastroesophageal reflux disease)   . Diverticulosis of colon (without mention of hemorrhage)   . Colonic polyp     hx of  . Degenerative joint disease   . Personal history of unspecified urinary disorder   . Osteoporosis, unspecified   . Unspecified hypothyroidism   . History of hysterectomy     PAST SURGICAL HISTORY: Past Surgical History  Procedure Laterality Date  . Back surgery      diskectomy L-5. Multiple back surgeries  . Abdominal hysterectomy      FAMILY HISTORY: Family History  Problem Relation Age of Onset  . Kidney cancer Father   . Heart attack Father   . Heart disease Father   . Colon cancer Mother   . Ovarian cancer Mother   . Heart disease Mother   . Heart failure Mother     SOCIAL HISTORY:  History   Social History  . Marital Status: Married    Spouse Name: [name]    Number of Children: 1  . Years of Education: 2   Occupational History  . Metallurgist    Social History Main Topics  . Smoking status: Never Smoker   . Smokeless tobacco: Never Used  . Alcohol Use: No  . Drug Use: No  . Sexually Active: Not on file   Other Topics Concern  . Not on file   Social History Narrative   Pt lives at home with her spouse.   She consumes 2-3 glasses of caffeine.     PHYSICAL EXAM  Filed Vitals:   11/11/12 1447  BP: 134/75  Pulse: 64  Height: 5' 3.5" (1.613 m)  Weight: 126 lb 8 oz (57.38 kg)   Body  mass index is 22.05 kg/(m^2).  GENERAL EXAM: Patient is in no distress  CARDIOVASCULAR: Regular rate and rhythm, no murmurs, no carotid bruits  NEUROLOGIC: MENTAL STATUS: awake, alert, language fluent, comprehension intact, naming intact CRANIAL NERVE: no papilledema on fundoscopic exam, pupils equal and reactive to light, visual fields full to confrontation, extraocular muscles intact, no nystagmus, facial sensation and strength symmetric, uvula midline, shoulder shrug symmetric, tongue midline. MOTOR: LUE AND LLE WEAKNESS (FLUCTUATING).  SENSORY: INTACT TO LT. COORDINATION: finger-nose-finger, fine finger movements normal REFLEXES: BUE 1, KNEES TRACE, ANKLES 0. GAIT/STATION: narrow based gait   DIAGNOSTIC DATA (LABS, IMAGING, TESTING) - I reviewed patient records, labs, notes, testing and imaging myself where available.  Lab Results  Component Value Date   WBC 6.7 07/21/2012   HGB 11.9* 07/21/2012   HCT 36.5 07/21/2012   MCV 90.9 07/21/2012   PLT 245.0 07/21/2012      Component Value Date/Time   NA 141 01/14/2012 0910   K 4.8 01/14/2012 0910   CL 107 01/14/2012 0910   CO2 26 01/14/2012 0910   GLUCOSE 89 01/14/2012 0910   BUN 38* 01/14/2012 0910   CREATININE 1.9* 01/14/2012 0910   CALCIUM 9.4 01/14/2012 0910   CALCIUM 9.5 01/14/2012 0910   PROT 7.5 01/14/2012 0910   ALBUMIN 4.0 01/14/2012 0910   AST 31 01/14/2012 0910   ALT 20 01/14/2012 0910   ALKPHOS 65 01/14/2012 0910   BILITOT 0.6 01/14/2012 0910   GFRNONAA 42.50 12/04/2009 0831   GFRAA  Value: 41        The eGFR has been calculated using the MDRD equation. This calculation has not been validated in all clinical situations. eGFR's persistently <60 mL/min signify possible Chronic Kidney Disease.* 07/09/2009 1449   Lab Results  Component Value Date   CHOL 264* 01/14/2012   HDL 81.60 01/14/2012   LDLCALC  Value: 122        Total Cholesterol/HDL:CHD Risk Coronary Heart Disease Risk Table                     Men   Women  1/2 Average Risk    3.4   3.3  Average Risk       5.0   4.4  2 X Average Risk   9.6   7.1  3 X Average Risk  23.4   11.0        Use the calculated Patient Ratio above and the CHD Risk Table to determine the patient's CHD Risk.        ATP III CLASSIFICATION (LDL):  <100     mg/dL   Optimal  100-129  mg/dL   Near or Above                    Optimal  130-159  mg/dL   Borderline  160-189  mg/dL   High  >190     mg/dL   Very High* 07/09/2009   LDLDIRECT 155.1 01/14/2012   TRIG 73.0 01/14/2012   CHOLHDL 3 01/14/2012   No results found for this basename: HGBA1C   No results found for this basename: VITAMINB12   Lab Results  Component Value Date   TSH 0.61 01/14/2012    08/23/12 MRI brain - mild changes of chronic microvascular ischemia. Overall slight progression of these changes compared with previous MRI scan dated 11/24/2007.  08/23/12 MRI cervical spine - mild spondylitic changes from C3-C7 most noticeable at C4-5 and C5-6 where there is mild canal and bilateral foraminal narrowing but without definite encroachment of the nerve root. Overall slight worsening compared with previous MRI scan dated 06/11/2005.   ASSESSMENT AND PLAN  72 y.o. year old female  has a past medical history of Hypertension; Palpitations; Pure hypercholesterolemia; CVA (cerebral vascular accident); Secondary hyperparathyroidism (of renal origin); Pulmonary nodule; GERD (gastroesophageal reflux disease); Diverticulosis of colon (without mention of hemorrhage); Colonic polyp; Degenerative joint disease; Personal history of unspecified urinary disorder; Osteoporosis, unspecified; Unspecified hypothyroidism; and History of hysterectomy. here with left face, left arm left leg numbness and pain in her left neck, left shoulder left arm and left leg. Symptoms are similar to what she has had in the past, and seemed to have fluctuated over the past 10 years.  MRI brain and c-spine show slight changes from prior, but no acute findings.   PLAN: 1. Continue  gabapentin 2. Follow up as needed   Penni Bombard, MD [id number], 0000000 PM Certified in Neurology, Neurophysiology and [facility] [address] [place], [place] [address] [phone]

## 2012-11-18 DIAGNOSIS — Z1231 Encounter for screening mammogram for malignant neoplasm of breast: Secondary | ICD-10-CM | POA: Diagnosis not present

## 2012-11-22 DIAGNOSIS — N6489 Other specified disorders of breast: Secondary | ICD-10-CM | POA: Diagnosis not present

## 2012-11-24 ENCOUNTER — Other Ambulatory Visit: Payer: Self-pay

## 2012-11-24 MED ORDER — GABAPENTIN 100 MG PO CAPS: 200.0000 mg | ORAL_CAPSULE | Freq: Two times a day (BID) | ORAL | Status: DC

## 2012-11-24 NOTE — Telephone Encounter (Signed)
[facility] called asking that we resend the Rx for Gabapentin.  Ref # [id number]

## 2012-12-08 ENCOUNTER — Encounter: Payer: Self-pay | Admitting: Endocrinology

## 2012-12-18 ENCOUNTER — Other Ambulatory Visit: Payer: Self-pay | Admitting: Cardiology

## 2012-12-28 ENCOUNTER — Encounter: Payer: Medicare Other | Admitting: Cardiology

## 2012-12-28 ENCOUNTER — Telehealth: Payer: Self-pay | Admitting: *Deleted

## 2012-12-28 ENCOUNTER — Ambulatory Visit (INDEPENDENT_AMBULATORY_CARE_PROVIDER_SITE_OTHER): Payer: Medicare Other | Admitting: Cardiology

## 2012-12-28 ENCOUNTER — Encounter: Payer: Self-pay | Admitting: Cardiology

## 2012-12-28 VITALS — BP 124/78 | HR 63 | Ht 63.5 in | Wt 127.0 lb

## 2012-12-28 DIAGNOSIS — I6350 Cerebral infarction due to unspecified occlusion or stenosis of unspecified cerebral artery: Secondary | ICD-10-CM

## 2012-12-28 DIAGNOSIS — R55 Syncope and collapse: Secondary | ICD-10-CM

## 2012-12-28 DIAGNOSIS — M479 Spondylosis, unspecified: Secondary | ICD-10-CM

## 2012-12-28 DIAGNOSIS — I10 Essential (primary) hypertension: Secondary | ICD-10-CM | POA: Diagnosis not present

## 2012-12-28 DIAGNOSIS — R002 Palpitations: Secondary | ICD-10-CM | POA: Diagnosis not present

## 2012-12-28 DIAGNOSIS — R0602 Shortness of breath: Secondary | ICD-10-CM

## 2012-12-28 DIAGNOSIS — E7800 Pure hypercholesterolemia, unspecified: Secondary | ICD-10-CM

## 2012-12-28 MED ORDER — ROSUVASTATIN CALCIUM 5 MG PO TABS: 5.0000 mg | ORAL_TABLET | Freq: Every day | ORAL | Status: DC

## 2012-12-28 MED ORDER — METOPROLOL SUCCINATE ER 50 MG PO TB24: ORAL_TABLET | ORAL | Status: DC

## 2012-12-28 NOTE — Telephone Encounter (Signed)
Pt requesting a referral to Dr Sherley Bounds at [facility]. Please advise.

## 2012-12-28 NOTE — Progress Notes (Signed)
Patient ID: Katrina Yang, female   DOB: 1941/04/02, 72 y.o.   MRN: [id number] PCP: Dr. Loanne Drilling  72 yo with history of HTN and CVA presents for cardiology followup.  She has been seen by Dr. Lia Foyer in the past and is seen by me for the first time today.  She has a history of labile blood pressure, but recently, her blood pressure has been under reasonable control.  However, for the last couple of months, she has been having periodic "weak spells."  These occur every 1-2 weeks.  Episodes are not positional and can be prolonged.  She will get lightheaded and feel like she will pass out.  She has never actually passed out.  She will feel her heart race.  She will take her blood pressure and find it to be around systolic [id number].    Besides the "weak spells," she is doing well.  She is active and does a fair amount of walking.  No chest pain or exertional dyspnea.  She has not been on a statin because when she took Mevacor years ago, she developed severe pain all along her left side.    ECG: NSR, normal  Labs (9/13): creatinine 1.9, LDL 155, HDL 82  PMH: 1. Pulmonary nodule: Stable with serial followup.  2. HTN: Renal artery dopplers in 2012 with no evidence for significant renal artery stenosis.  3. Palpitations 4. Hyperlipidemia 5. CVA: On ASA and Plavix.  6. Secondary hyperparathyroidism 7. GERD 8. Hypothyroidism 9. TAH 10. Back surgery 11. Left carotid bruit: Minor plaque on carotid dopplers in 4/13.  12. CKD: Sees nephrologist at [facility].  13. Peripheral neuropathy.   SH: Married, 1 child, nonsmoker, retired Pharmacist, hospital.   FH: CAD both parents.  ROS: All systems reviewed and negative except as per HPI.   Current Outpatient Prescriptions  Medication Sig Dispense Refill  . aspirin 81 MG tablet Take 1 tablet (81 mg total) by mouth daily.      . calcitRIOL (ROCALTROL) 0.25 MCG capsule TAKE 1 CAPSULE DAILY  90 capsule  2  . clopidogrel (PLAVIX) 75 MG tablet Take 75 mg by mouth daily.         Marland Kitchen gabapentin (NEURONTIN) 100 MG capsule Take 200 mg by mouth daily.      Marland Kitchen levothyroxine (SYNTHROID, LEVOTHROID) 88 MCG tablet TAKE 1 TABLET ONCE DAILY  90 tablet  3  . lisinopril (PRINIVIL,ZESTRIL) 2.5 MG tablet Take 1 tablet (2.5 mg total) by mouth daily.  90 tablet  0  . metoprolol succinate (TOPROL-XL) 50 MG 24 hr tablet TAKE 1 TABLET DAILY  90 tablet  3  . rosuvastatin (CRESTOR) 5 MG tablet Take 1 tablet (5 mg total) by mouth daily.  30 tablet  3   No current facility-administered medications for this visit.    BP 124/78  Pulse 63  Ht 5' 3.5" (1.613 m)  Wt 57.607 kg (127 lb)  BMI 22.14 kg/m2 General: NAD Neck: No JVD, no thyromegaly or thyroid nodule.  Lungs: Clear to auscultation bilaterally with normal respiratory effort. CV: Nondisplaced PMI.  Heart regular S1/S2, no S3/S4, no murmur.  No peripheral edema.  Soft left carotid bruit.  Normal pedal pulses.  Abdomen: Soft, nontender, no hepatosplenomegaly, no distention.  Skin: Intact without lesions or rashes.  Neurologic: Alert and oriented x 3.  Psych: Normal affect. Extremities: No clubbing or cyanosis.   Assessment/Plan: 1. Presyncopal episodes/"weak spells": No particular trigger (not positional).  She does feel her heart race.  I will  have her wear a 3 week event monitor to assess for any significant arrhythmias.  I will also get an echocardiogram.   2. Hyperlipidemia: LDL quite high in 9/13, and she has a history of CVA.  She did not tolerate Mevacor years ago.  I will have her start Crestor 5 mg daily with lipids/LFTs in 2 months.   3. H/o CVA: She is on ASA 81 and Plavix per neurology.  As above, she needs to be on a statin given vascular disease.  4. Carotid stenosis: Bruit on exam.  Mild plaque in 4/13.  Repeat study 4/15.    [name] 12/28/2012

## 2012-12-28 NOTE — Patient Instructions (Addendum)
Start crestor 5mg  daily.   Your physician has requested that you have an echocardiogram. Echocardiography is a painless test that uses sound waves to create images of your heart. It provides your doctor with information about the size and shape of your heart and how well your heart's chambers and valves are working. This procedure takes approximately one hour. There are no restrictions for this procedure.  Your physician has recommended that you wear an event monitor. Event monitors are medical devices that record the heart's electrical activity. Doctors most often [place] these monitors to diagnose arrhythmias. Arrhythmias are problems with the speed or rhythm of the heartbeat. The monitor is a small, portable device. You can wear one while you do your normal daily activities. This is usually used to diagnose what is causing palpitations/syncope (passing out). 3 week event monitor  Your physician recommends that you return for a FASTING lipid profile /liver profile in 2 months.  Your physician recommends that you schedule a follow-up appointment in: 3 months with Dr Aundra Dubin.

## 2012-12-28 NOTE — Telephone Encounter (Signed)
done

## 2012-12-28 NOTE — Telephone Encounter (Signed)
Lm pt to call if any questions

## 2012-12-29 NOTE — Addendum Note (Signed)
Addended by: Katrine Coho on: 12/29/2012 02:41 PM   Modules accepted: Orders

## 2012-12-30 ENCOUNTER — Ambulatory Visit (HOSPITAL_COMMUNITY): Payer: Medicare Other | Attending: Cardiology | Admitting: Radiology

## 2012-12-30 ENCOUNTER — Telehealth: Payer: Self-pay

## 2012-12-30 ENCOUNTER — Encounter: Payer: Self-pay | Admitting: *Deleted

## 2012-12-30 ENCOUNTER — Encounter (INDEPENDENT_AMBULATORY_CARE_PROVIDER_SITE_OTHER): Payer: Medicare Other

## 2012-12-30 DIAGNOSIS — I079 Rheumatic tricuspid valve disease, unspecified: Secondary | ICD-10-CM | POA: Insufficient documentation

## 2012-12-30 DIAGNOSIS — I10 Essential (primary) hypertension: Secondary | ICD-10-CM | POA: Insufficient documentation

## 2012-12-30 DIAGNOSIS — R002 Palpitations: Secondary | ICD-10-CM | POA: Diagnosis not present

## 2012-12-30 DIAGNOSIS — R55 Syncope and collapse: Secondary | ICD-10-CM | POA: Diagnosis not present

## 2012-12-30 DIAGNOSIS — R0609 Other forms of dyspnea: Secondary | ICD-10-CM | POA: Diagnosis not present

## 2012-12-30 DIAGNOSIS — I379 Nonrheumatic pulmonary valve disorder, unspecified: Secondary | ICD-10-CM | POA: Diagnosis not present

## 2012-12-30 DIAGNOSIS — R0602 Shortness of breath: Secondary | ICD-10-CM | POA: Diagnosis not present

## 2012-12-30 DIAGNOSIS — R0989 Other specified symptoms and signs involving the circulatory and respiratory systems: Secondary | ICD-10-CM | POA: Diagnosis not present

## 2012-12-30 NOTE — Progress Notes (Signed)
Echocardiogram performed.  

## 2012-12-30 NOTE — Telephone Encounter (Signed)
[facility] at [facility], pt has not been seen recently, [facility] neurosurgical requires a recent office visit note in order to do a referral, please advise

## 2012-12-30 NOTE — Telephone Encounter (Signed)
Please advise pt ov here is needed

## 2012-12-30 NOTE — Progress Notes (Signed)
Patient ID: Katrina Yang, female   DOB: [birth year], 72 y.o.   MRN: [id number] [employer] 21 day cardiac event monitor applied to patient.

## 2012-12-31 ENCOUNTER — Other Ambulatory Visit: Payer: Self-pay

## 2012-12-31 DIAGNOSIS — Z0000 Encounter for general adult medical examination without abnormal findings: Secondary | ICD-10-CM

## 2012-12-31 NOTE — Telephone Encounter (Signed)
Please advise pt we can't do a referral until she is seen

## 2012-12-31 NOTE — Telephone Encounter (Signed)
Pt has a cpe on 01/19/13 with you

## 2012-12-31 NOTE — Telephone Encounter (Signed)
Pt advised and states she needs to keep cpe appointment in September

## 2013-01-04 ENCOUNTER — Telehealth: Payer: Self-pay | Admitting: Cardiology

## 2013-01-04 NOTE — Telephone Encounter (Signed)
Pt given echo results. Pt states she mailed monitor back on Saturday.

## 2013-01-04 NOTE — Telephone Encounter (Signed)
See earlier phone note 01/04/13

## 2013-01-04 NOTE — Telephone Encounter (Signed)
New Problem  States the pt was supposed to wear the monitor for 21 days// pt wore it for a week.

## 2013-01-04 NOTE — Telephone Encounter (Signed)
Follow up  Pt states she is returning your call.

## 2013-01-04 NOTE — Telephone Encounter (Signed)
OK, will take a look at what we have.

## 2013-01-04 NOTE — Telephone Encounter (Signed)
Pt states it was too much with her history of CVA and nerve pain.

## 2013-01-06 ENCOUNTER — Other Ambulatory Visit: Payer: Self-pay | Admitting: Neurology

## 2013-01-10 ENCOUNTER — Other Ambulatory Visit: Payer: Medicare Other

## 2013-01-11 ENCOUNTER — Other Ambulatory Visit: Payer: Self-pay

## 2013-01-11 DIAGNOSIS — R55 Syncope and collapse: Secondary | ICD-10-CM

## 2013-01-11 DIAGNOSIS — L821 Other seborrheic keratosis: Secondary | ICD-10-CM | POA: Diagnosis not present

## 2013-01-11 DIAGNOSIS — L570 Actinic keratosis: Secondary | ICD-10-CM | POA: Diagnosis not present

## 2013-01-11 DIAGNOSIS — I10 Essential (primary) hypertension: Secondary | ICD-10-CM

## 2013-01-11 DIAGNOSIS — R002 Palpitations: Secondary | ICD-10-CM

## 2013-01-11 MED ORDER — ROSUVASTATIN CALCIUM 10 MG PO TABS: ORAL_TABLET | ORAL | Status: DC

## 2013-01-11 NOTE — Telephone Encounter (Signed)
refill 

## 2013-01-14 ENCOUNTER — Other Ambulatory Visit: Payer: Medicare Other

## 2013-01-19 ENCOUNTER — Ambulatory Visit (INDEPENDENT_AMBULATORY_CARE_PROVIDER_SITE_OTHER): Payer: Medicare Other | Admitting: Endocrinology

## 2013-01-19 ENCOUNTER — Encounter: Payer: Self-pay | Admitting: Endocrinology

## 2013-01-19 VITALS — BP 132/70 | HR 78 | Ht 61.0 in | Wt 125.0 lb

## 2013-01-19 DIAGNOSIS — N2581 Secondary hyperparathyroidism of renal origin: Secondary | ICD-10-CM

## 2013-01-19 DIAGNOSIS — M545 Low back pain, unspecified: Secondary | ICD-10-CM

## 2013-01-19 DIAGNOSIS — N183 Chronic kidney disease, stage 3 unspecified: Secondary | ICD-10-CM

## 2013-01-19 DIAGNOSIS — D649 Anemia, unspecified: Secondary | ICD-10-CM

## 2013-01-19 DIAGNOSIS — I10 Essential (primary) hypertension: Secondary | ICD-10-CM

## 2013-01-19 DIAGNOSIS — Z23 Encounter for immunization: Secondary | ICD-10-CM | POA: Diagnosis not present

## 2013-01-19 DIAGNOSIS — R209 Unspecified disturbances of skin sensation: Secondary | ICD-10-CM

## 2013-01-19 DIAGNOSIS — E039 Hypothyroidism, unspecified: Secondary | ICD-10-CM

## 2013-01-19 DIAGNOSIS — Z0000 Encounter for general adult medical examination without abnormal findings: Secondary | ICD-10-CM

## 2013-01-19 DIAGNOSIS — M810 Age-related osteoporosis without current pathological fracture: Secondary | ICD-10-CM

## 2013-01-19 DIAGNOSIS — E7800 Pure hypercholesterolemia, unspecified: Secondary | ICD-10-CM

## 2013-01-19 DIAGNOSIS — Z79899 Other long term (current) drug therapy: Secondary | ICD-10-CM | POA: Diagnosis not present

## 2013-01-19 DIAGNOSIS — R200 Anesthesia of skin: Secondary | ICD-10-CM

## 2013-01-19 LAB — BASIC METABOLIC PANEL
CO2: 27 mEq/L (ref 19–32)
Calcium: 9.5 mg/dL (ref 8.4–10.5)
Chloride: 106 mEq/L (ref 96–112)
Creatinine, Ser: 2.1 mg/dL — ABNORMAL HIGH (ref 0.4–1.2)
Glucose, Bld: 89 mg/dL (ref 70–99)

## 2013-01-19 LAB — CBC WITH DIFFERENTIAL/PLATELET
Basophils Relative: 0.7 % (ref 0.0–3.0)
Eosinophils Absolute: 0.4 10*3/uL (ref 0.0–0.7)
Eosinophils Relative: 7 % — ABNORMAL HIGH (ref 0.0–5.0)
Hemoglobin: 11.6 g/dL — ABNORMAL LOW (ref 12.0–15.0)
Lymphocytes Relative: 26.9 % (ref 12.0–46.0)
MCHC: 33.7 g/dL (ref 30.0–36.0)
Neutro Abs: 3.3 10*3/uL (ref 1.4–7.7)
RBC: 3.86 Mil/uL — ABNORMAL LOW (ref 3.87–5.11)
WBC: 5.7 10*3/uL (ref 4.5–10.5)

## 2013-01-19 LAB — URINALYSIS, ROUTINE W REFLEX MICROSCOPIC
Ketones, ur: NEGATIVE
RBC / HPF: NONE SEEN (ref 0–?)
Specific Gravity, Urine: 1.015 (ref 1.000–1.030)
Total Protein, Urine: NEGATIVE
Urine Glucose: NEGATIVE
pH: 6 (ref 5.0–8.0)

## 2013-01-19 LAB — HEPATIC FUNCTION PANEL
AST: 28 U/L (ref 0–37)
Alkaline Phosphatase: 64 U/L (ref 39–117)
Total Bilirubin: 0.6 mg/dL (ref 0.3–1.2)

## 2013-01-19 LAB — LDL CHOLESTEROL, DIRECT: Direct LDL: 111.5 mg/dL

## 2013-01-19 LAB — LIPID PANEL
Cholesterol: 221 mg/dL — ABNORMAL HIGH (ref 0–200)
Triglycerides: 78 mg/dL (ref 0.0–149.0)

## 2013-01-19 NOTE — Progress Notes (Signed)
Subjective:    Patient ID: Katrina Yang, female    DOB: 01-16-1941, 72 y.o.   MRN: [id number]  HPI Pt has many years of moderate pain at the lower back.  She has had several surgeries, but no metal.  No assoc bowel retention. Past Medical History  Diagnosis Date  . Hypertension   . Palpitations   . Pure hypercholesterolemia   . CVA (cerebral vascular accident)     hx of  . Secondary hyperparathyroidism (of renal origin)   . Pulmonary nodule   . GERD (gastroesophageal reflux disease)   . Diverticulosis of colon (without mention of hemorrhage)   . Colonic polyp     hx of  . Degenerative joint disease   . Personal history of unspecified urinary disorder   . Osteoporosis, unspecified   . Unspecified hypothyroidism   . History of hysterectomy     Past Surgical History  Procedure Laterality Date  . Back surgery      diskectomy L-5. Multiple back surgeries  . Abdominal hysterectomy      History   Social History  . Marital Status: Married    Spouse Name: [name]    Number of Children: 1  . Years of Education: 2   Occupational History  . Metallurgist    Social History Main Topics  . Smoking status: Never Smoker   . Smokeless tobacco: Never Used  . Alcohol Use: No  . Drug Use: No  . Sexual Activity: Not on file   Other Topics Concern  . Not on file   Social History Narrative   Pt lives at home with her spouse.   She consumes 2-3 glasses of caffeine.    Current Outpatient Prescriptions on File Prior to Visit  Medication Sig Dispense Refill  . aspirin 81 MG tablet Take 1 tablet (81 mg total) by mouth daily.      . calcitRIOL (ROCALTROL) 0.25 MCG capsule TAKE 1 CAPSULE DAILY  90 capsule  2  . clopidogrel (PLAVIX) 75 MG tablet TAKE 1 TABLET DAILY  90 tablet  2  . gabapentin (NEURONTIN) 100 MG capsule Take 200 mg by mouth daily.      Marland Kitchen levothyroxine (SYNTHROID, LEVOTHROID) 88 MCG tablet TAKE 1 TABLET ONCE DAILY  90 tablet  3  . lisinopril (PRINIVIL,ZESTRIL) 2.5 MG  tablet Take 1 tablet (2.5 mg total) by mouth daily.  90 tablet  0  . metoprolol succinate (TOPROL-XL) 50 MG 24 hr tablet TAKE 1 TABLET DAILY  90 tablet  3  . rosuvastatin (CRESTOR) 5 MG tablet Take 1 tablet (5 mg total) by mouth daily.  30 tablet  3  . rosuvastatin (CRESTOR) 10 MG tablet Take 1/2 tab daily  90 tablet  3   No current facility-administered medications on file prior to visit.    Allergies  Allergen Reactions  . Codeine   . Iodine   . Iohexol   . Lovastatin   . Metronidazole     REACTION: Reaction not known  . Sulfamethoxazole W-Trimethoprim   . Sulfonamide Derivatives     Family History  Problem Relation Age of Onset  . Kidney cancer Father   . Heart attack Father   . Heart disease Father   . Colon cancer Mother   . Ovarian cancer Mother   . Heart disease Mother   . Heart failure Mother    BP 132/70  Pulse 78  Ht 5\' 1"  (1.549 m)  Wt 125 lb (56.7 kg)  BMI 23.63 kg/m2  SpO2 97%  Review of Systems No bladder retention.  Denies sob.    Objective:   Physical Exam Spine: nontender Gait: normal and steady.   Neuro: sensation is intact to touch on the feet Lab Results  Component Value Date   WBC 5.7 01/19/2013   HGB 11.6* 01/19/2013   HCT 34.5* 01/19/2013   PLT 250.0 01/19/2013   GLUCOSE 89 01/19/2013   CHOL 221* 01/19/2013   TRIG 78.0 01/19/2013   HDL 82.60 01/19/2013   LDLDIRECT 111.5 01/19/2013   LDLCALC  Value: 122        Total Cholesterol/HDL:CHD Risk Coronary Heart Disease Risk Table                     Men   Women  1/2 Average Risk   3.4   3.3  Average Risk       5.0   4.4  2 X Average Risk   9.6   7.1  3 X Average Risk  23.4   11.0        Use the calculated Patient Ratio above and the CHD Risk Table to determine the patient's CHD Risk.        ATP III CLASSIFICATION (LDL):  <100     mg/dL   Optimal  100-129  mg/dL   Near or Above                    Optimal  130-159  mg/dL   Borderline  160-189  mg/dL   High  >190     mg/dL   Very High* 07/09/2009   ALT 19  01/19/2013   AST 28 01/19/2013   NA 139 01/19/2013   K 4.6 01/19/2013   CL 106 01/19/2013   CREATININE 2.1* 01/19/2013   BUN 33* 01/19/2013   CO2 27 01/19/2013   TSH 0.37 01/19/2013   INR 0.98 07/09/2009      Assessment & Plan:  Anemia, mild, prob due to renal failure. Renal failure, slightly worse Low back pain, recurrent.   Subjective:   Patient here for Medicare annual wellness visit and management of other chronic and acute problems.     Risk factors: advanced age    1 of Physicians Providing Medical Care to Patient:  See "snapshot"   Activities of Daily Living: In your present state of health, do you have any difficulty performing the following activities?:  Preparing food and eating?: No  Bathing yourself: No  Getting dressed: No  Using the toilet:No  Moving around from place to place: No  In the past year have you fallen or had a near fall?:No    Home Safety: Has smoke detector and wears seat belts. Firearms are safely secured. No excess sun exposure.  Diet and Exercise  Current exercise habits: pt says good Dietary issues discussed: pt reports a healthy diet   Depression Screen  Q1: Over the past two weeks, have you felt down, depressed or hopeless?no  Q2: Over the past two weeks, have you felt little interest or pleasure in doing things? no   The following portions of the patient's history were reviewed and updated as appropriate: allergies, current medications, past family history, past medical history, past social history, past surgical history and problem list.  Past Medical History  Diagnosis Date  . Hypertension   . Palpitations   . Pure hypercholesterolemia   . CVA (cerebral vascular accident)     hx of  . Secondary hyperparathyroidism (of renal  origin)   . Pulmonary nodule   . GERD (gastroesophageal reflux disease)   . Diverticulosis of colon (without mention of hemorrhage)   . Colonic polyp     hx of  . Degenerative joint disease   . Personal  history of unspecified urinary disorder   . Osteoporosis, unspecified   . Unspecified hypothyroidism   . History of hysterectomy     Past Surgical History  Procedure Laterality Date  . Back surgery      diskectomy L-5. Multiple back surgeries  . Abdominal hysterectomy      History   Social History  . Marital Status: Married    Spouse Name: [name]    Number of Children: 1  . Years of Education: 2   Occupational History  . Metallurgist    Social History Main Topics  . Smoking status: Never Smoker   . Smokeless tobacco: Never Used  . Alcohol Use: No  . Drug Use: No  . Sexual Activity: Not on file   Other Topics Concern  . Not on file   Social History Narrative   Pt lives at home with her spouse.   She consumes 2-3 glasses of caffeine.    Current Outpatient Prescriptions on File Prior to Visit  Medication Sig Dispense Refill  . aspirin 81 MG tablet Take 1 tablet (81 mg total) by mouth daily.      . calcitRIOL (ROCALTROL) 0.25 MCG capsule TAKE 1 CAPSULE DAILY  90 capsule  2  . clopidogrel (PLAVIX) 75 MG tablet TAKE 1 TABLET DAILY  90 tablet  2  . gabapentin (NEURONTIN) 100 MG capsule Take 200 mg by mouth daily.      Marland Kitchen levothyroxine (SYNTHROID, LEVOTHROID) 88 MCG tablet TAKE 1 TABLET ONCE DAILY  90 tablet  3  . lisinopril (PRINIVIL,ZESTRIL) 2.5 MG tablet Take 1 tablet (2.5 mg total) by mouth daily.  90 tablet  0  . metoprolol succinate (TOPROL-XL) 50 MG 24 hr tablet TAKE 1 TABLET DAILY  90 tablet  3  . rosuvastatin (CRESTOR) 5 MG tablet Take 1 tablet (5 mg total) by mouth daily.  30 tablet  3  . rosuvastatin (CRESTOR) 10 MG tablet Take 1/2 tab daily  90 tablet  3   No current facility-administered medications on file prior to visit.    Allergies  Allergen Reactions  . Codeine   . Iodine   . Iohexol   . Lovastatin   . Metronidazole     REACTION: Reaction not known  . Sulfamethoxazole W-Trimethoprim   . Sulfonamide Derivatives     Family History  Problem  Relation Age of Onset  . Kidney cancer Father   . Heart attack Father   . Heart disease Father   . Colon cancer Mother   . Ovarian cancer Mother   . Heart disease Mother   . Heart failure Mother     BP 132/70  Pulse 78  Ht 5\' 1"  (1.549 m)  Wt 125 lb (56.7 kg)  BMI 23.63 kg/m2  SpO2 97%   Review of Systems  Denies hearing loss, and visual loss Objective:   Vision:  Sees opthalmologist Hearing: grossly normal Body mass index:  See vs page Msk: pt easily and quickly performs "get-up-and-go" from a sitting position Cognitive Impairment Assessment: cognition, memory and judgment appear normal.  remembers 3/3 at 5 minutes.  excellent recall.  can easily read and write a sentence.  alert and oriented x 3   Assessment:   Medicare wellness utd  on preventive parameters    Plan:   During the course of the visit the patient was educated and counseled about appropriate screening and preventive services including:        Fall prevention   Screening mammography  Bone densitometry screening  Diabetes screening  Nutrition counseling   Vaccines / LABS Zostavax / Pnemonccoal Vaccine  today   Patient Instructions (the written plan) was given to the patient.   we discussed code status.  pt requests full code, but would not want to be started or maintained on artificial life-support measures if there was not a reasonable chance of recovery

## 2013-01-19 NOTE — Patient Instructions (Addendum)
please consider these measures for your health:  minimize alcohol.  do not use tobacco products.  have a colonoscopy at least every 10 years from age 71.  Women should have an annual mammogram from age 21.  keep firearms safely stored.  always use seat belts.  have working smoke alarms in your home.  see an eye doctor and dentist regularly.  never drive under the influence of alcohol or drugs (including prescription drugs).  those with fair skin should take precautions against the sun. it is critically important to prevent falling down (keep floor areas well-lit, dry, and free of loose objects.  If you have a cane, walker, or wheelchair, you should use it, even for short trips around the house.  Also, try not to rush). good diet and exercise habits significanly improve your health.  please let me know if you wish to be referred to a dietician.  high blood sugar is very risky to your health.  you should see an eye doctor every year.  You are at higher than average risk for pneumonia and hepatitis-B.  You should be vaccinated against both.  blood tests are being requested for you today.  We'll contact you with results.

## 2013-01-20 ENCOUNTER — Ambulatory Visit: Payer: Medicare Other

## 2013-01-20 DIAGNOSIS — R002 Palpitations: Secondary | ICD-10-CM

## 2013-01-20 LAB — PTH, INTACT AND CALCIUM: PTH: 44.1 pg/mL (ref 14.0–72.0)

## 2013-01-20 LAB — IBC PANEL
Iron: 81 ug/dL (ref 42–145)
Saturation Ratios: 20.9 % (ref 20.0–50.0)
Transferrin: 276.8 mg/dL (ref 212.0–360.0)

## 2013-01-24 DIAGNOSIS — Z1212 Encounter for screening for malignant neoplasm of rectum: Secondary | ICD-10-CM | POA: Diagnosis not present

## 2013-01-24 DIAGNOSIS — Z1289 Encounter for screening for malignant neoplasm of other sites: Secondary | ICD-10-CM | POA: Diagnosis not present

## 2013-01-25 ENCOUNTER — Telehealth: Payer: Self-pay | Admitting: *Deleted

## 2013-01-25 NOTE — Telephone Encounter (Signed)
Pt notified no significant abnormalities on monitor 12/30/12-01/04/13  reviewed by Dr Aundra Dubin.

## 2013-02-01 DIAGNOSIS — N2581 Secondary hyperparathyroidism of renal origin: Secondary | ICD-10-CM | POA: Insufficient documentation

## 2013-02-01 DIAGNOSIS — I129 Hypertensive chronic kidney disease with stage 1 through stage 4 chronic kidney disease, or unspecified chronic kidney disease: Secondary | ICD-10-CM | POA: Diagnosis not present

## 2013-02-01 DIAGNOSIS — E559 Vitamin D deficiency, unspecified: Secondary | ICD-10-CM | POA: Diagnosis not present

## 2013-02-01 DIAGNOSIS — N183 Chronic kidney disease, stage 3 unspecified: Secondary | ICD-10-CM | POA: Diagnosis not present

## 2013-02-13 IMAGING — CR DG CHEST 2V
2 series · 2 of 2 positions shown · non-contrast
Comparison: 03/18/2010

CLINICAL DATA: Cough, chest pain and shortness of breath.

CHEST - 2 VIEW

[view not recorded (1 of 2)]
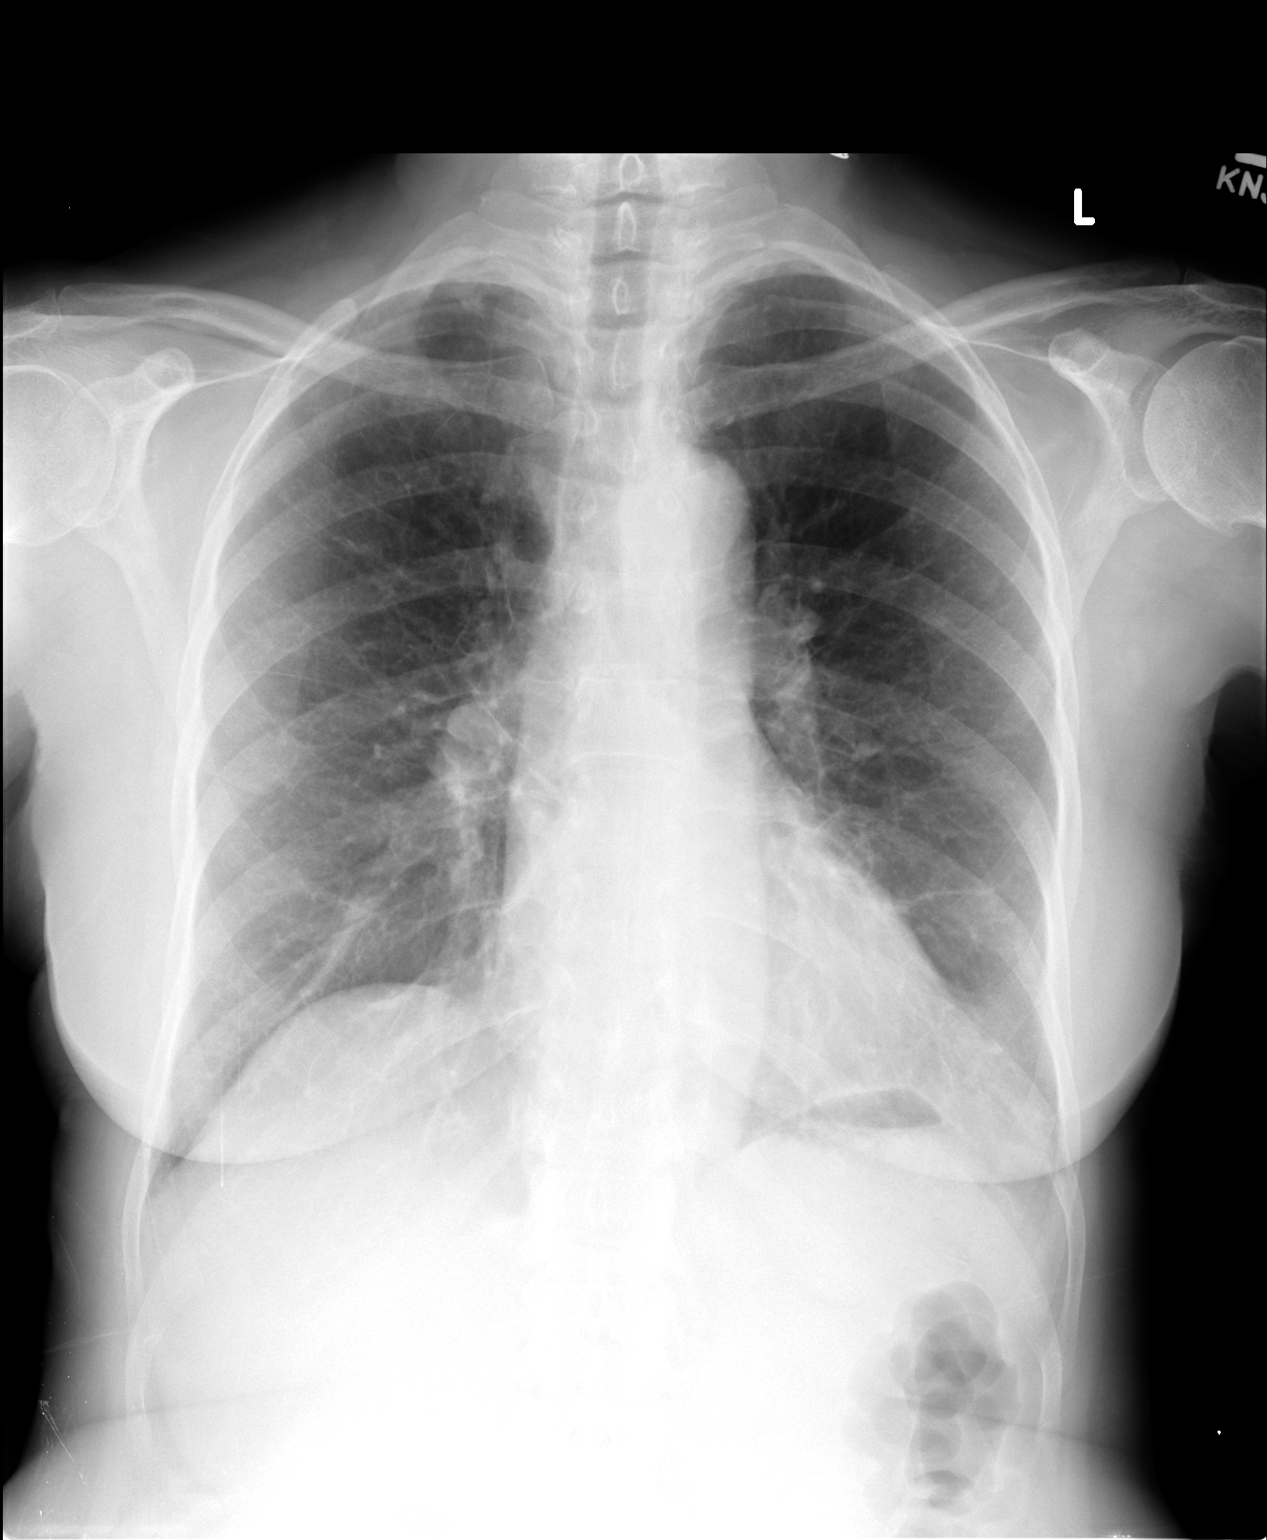

[view not recorded (2 of 2)]
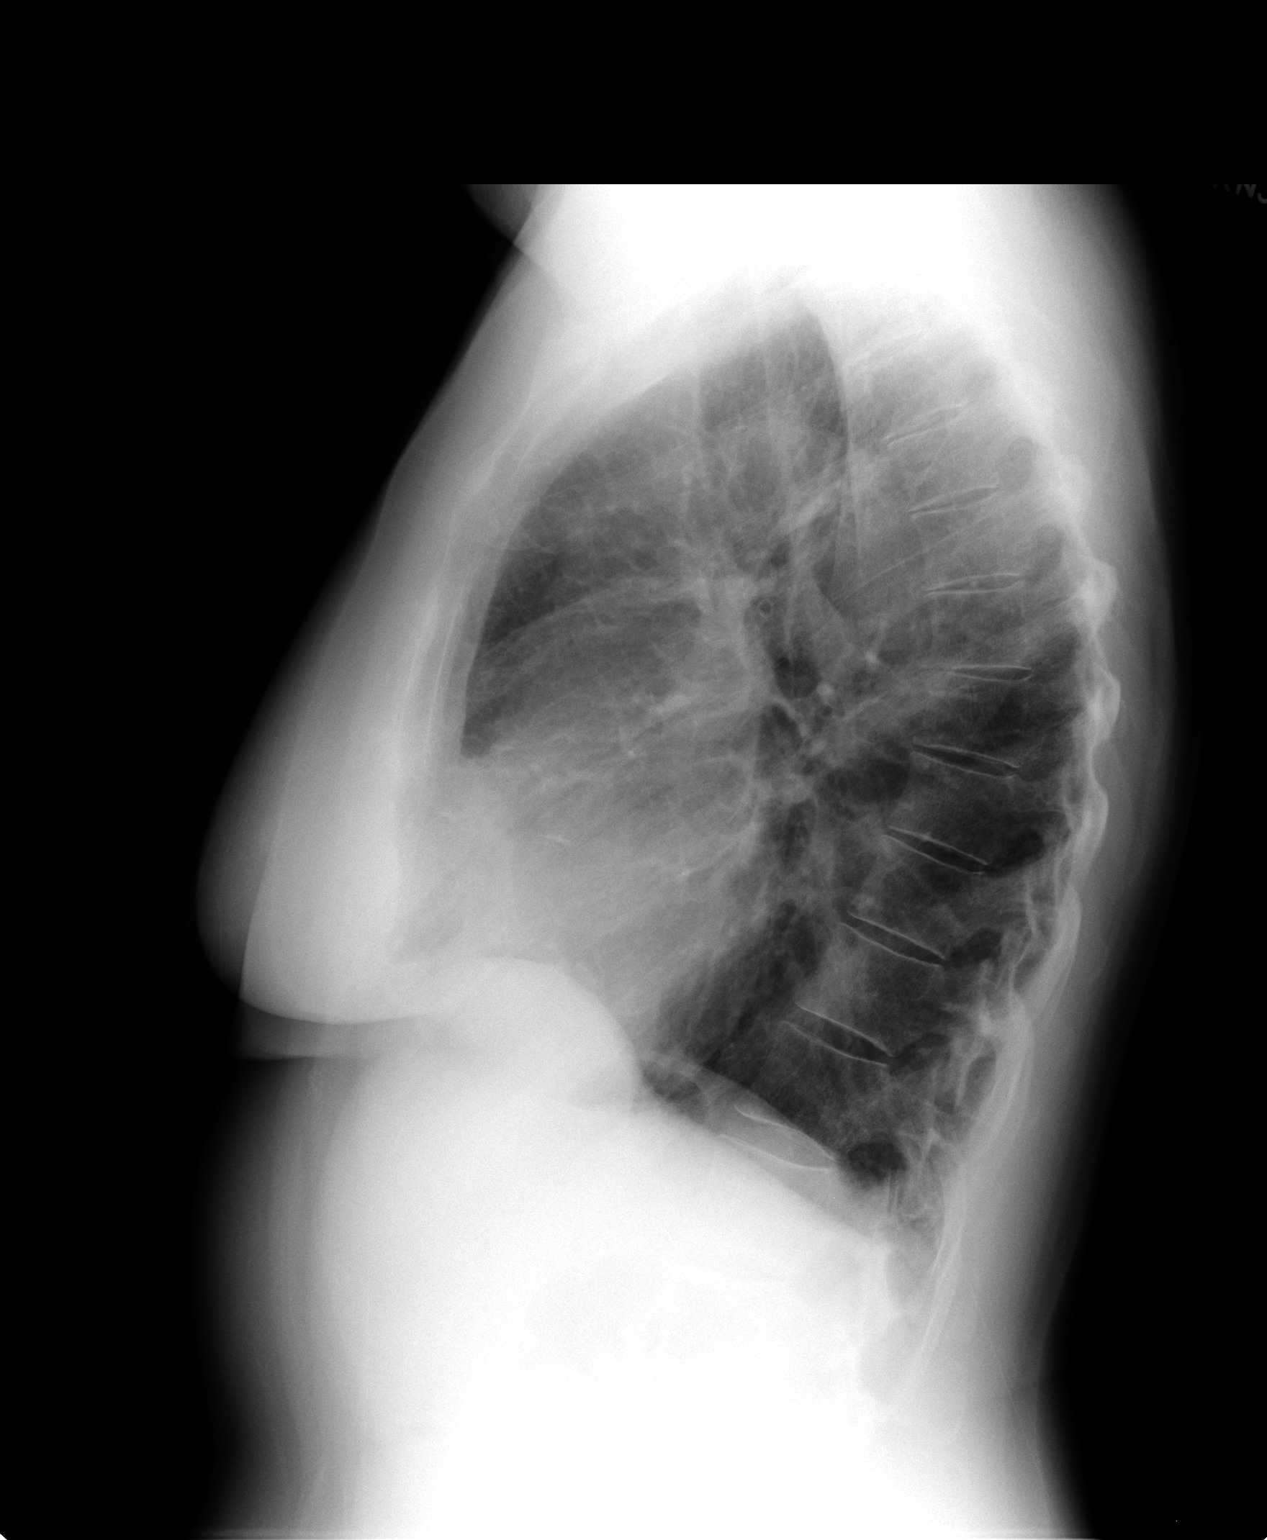

[2 of 2 positions shown; findings below may reference images not displayed]

FINDINGS: Trachea is midline.  Heart size normal.  Biapical pleural
parenchymal scarring is mild.  Probable scattered scarring at the
lung bases.  No pleural fluid.
IMPRESSION: Probable bibasilar scarring.  No acute findings.

## 2013-02-14 ENCOUNTER — Other Ambulatory Visit (INDEPENDENT_AMBULATORY_CARE_PROVIDER_SITE_OTHER): Payer: Medicare Other

## 2013-02-14 DIAGNOSIS — R002 Palpitations: Secondary | ICD-10-CM | POA: Diagnosis not present

## 2013-02-14 DIAGNOSIS — I10 Essential (primary) hypertension: Secondary | ICD-10-CM | POA: Diagnosis not present

## 2013-02-14 DIAGNOSIS — R55 Syncope and collapse: Secondary | ICD-10-CM | POA: Diagnosis not present

## 2013-02-14 DIAGNOSIS — IMO0002 Reserved for concepts with insufficient information to code with codable children: Secondary | ICD-10-CM | POA: Diagnosis not present

## 2013-02-14 LAB — HEPATIC FUNCTION PANEL
ALT: 17 U/L (ref 0–35)
Alkaline Phosphatase: 64 U/L (ref 39–117)
Bilirubin, Direct: 0 mg/dL (ref 0.0–0.3)
Total Bilirubin: 0.6 mg/dL (ref 0.3–1.2)
Total Protein: 7.1 g/dL (ref 6.0–8.3)

## 2013-02-17 ENCOUNTER — Other Ambulatory Visit: Payer: Self-pay | Admitting: Neurological Surgery

## 2013-02-17 DIAGNOSIS — M542 Cervicalgia: Secondary | ICD-10-CM

## 2013-02-17 DIAGNOSIS — M5416 Radiculopathy, lumbar region: Secondary | ICD-10-CM

## 2013-03-03 ENCOUNTER — Ambulatory Visit
Admission: RE | Admit: 2013-03-03 | Discharge: 2013-03-03 | Disposition: A | Discharge status: Home / Self Care | Payer: Medicare Other | Source: Ambulatory Visit | Attending: Neurological Surgery | Admitting: Neurological Surgery

## 2013-03-03 DIAGNOSIS — M47817 Spondylosis without myelopathy or radiculopathy, lumbosacral region: Secondary | ICD-10-CM | POA: Diagnosis not present

## 2013-03-03 DIAGNOSIS — M47812 Spondylosis without myelopathy or radiculopathy, cervical region: Secondary | ICD-10-CM | POA: Diagnosis not present

## 2013-03-03 DIAGNOSIS — M542 Cervicalgia: Secondary | ICD-10-CM

## 2013-03-03 DIAGNOSIS — M5416 Radiculopathy, lumbar region: Secondary | ICD-10-CM

## 2013-03-05 ENCOUNTER — Other Ambulatory Visit: Payer: Self-pay | Admitting: Endocrinology

## 2013-03-17 ENCOUNTER — Ambulatory Visit (INDEPENDENT_AMBULATORY_CARE_PROVIDER_SITE_OTHER): Payer: Medicare Other | Admitting: Cardiology

## 2013-03-17 ENCOUNTER — Encounter: Payer: Self-pay | Admitting: *Deleted

## 2013-03-17 ENCOUNTER — Encounter: Payer: Self-pay | Admitting: Cardiology

## 2013-03-17 VITALS — BP 120/80 | HR 58 | Ht 61.0 in | Wt 126.0 lb

## 2013-03-17 DIAGNOSIS — R002 Palpitations: Secondary | ICD-10-CM

## 2013-03-17 DIAGNOSIS — E7800 Pure hypercholesterolemia, unspecified: Secondary | ICD-10-CM | POA: Diagnosis not present

## 2013-03-17 DIAGNOSIS — R0989 Other specified symptoms and signs involving the circulatory and respiratory systems: Secondary | ICD-10-CM

## 2013-03-17 DIAGNOSIS — I6350 Cerebral infarction due to unspecified occlusion or stenosis of unspecified cerebral artery: Secondary | ICD-10-CM | POA: Diagnosis not present

## 2013-03-17 DIAGNOSIS — N183 Chronic kidney disease, stage 3 unspecified: Secondary | ICD-10-CM

## 2013-03-17 MED ORDER — ROSUVASTATIN CALCIUM 10 MG PO TABS: 10.0000 mg | ORAL_TABLET | Freq: Every day | ORAL | Status: DC

## 2013-03-17 NOTE — Patient Instructions (Signed)
Increase crestor to 10mg  daily. You can take 2 of your 5mg  tablets daily at the same time and use your current supply.  Your physician recommends that you return for a FASTING lipid profile /liver profile in 2 months.  Your physician has requested that you have a carotid duplex. This test is an ultrasound of the carotid arteries in your neck. It looks at blood flow through these arteries that supply the brain with blood. Allow one hour for this exam. There are no restrictions or special instructions. April 2015  Your physician wants you to follow-up in: 6 months with Dr Aundra Dubin. (May 2015).You will receive a reminder letter in the mail two months in advance. If you don't receive a letter, please call our office to schedule the follow-up appointment.

## 2013-03-17 NOTE — Progress Notes (Signed)
Patient ID: Katrina Yang, female   DOB: [birth year], 72 y.o.   MRN: [id number] PCP: Dr. Loanne Drilling  72 yo with history of HTN and CVA presents for cardiology followup.  She has a history of labile blood pressure, but recently, her blood pressure has been under reasonable control.  At a prior appointment, she reported "weak spells" and dizziness.  Event monitor in 8/14 showed no significant abnormalities and echo showed normal EF.  Currently, she is doing better.  She has only occasional palpitations lasting for a few seconds at a time.   She is active and does a fair amount of walking.  No chest pain or exertional dyspnea.  She has tolerated Crestor 5 mg daily without myalgias.   Labs (9/13): creatinine 1.9, LDL 155, HDL 82 Labs (9/14): K 4.6, creatinine 2.1 Labs (10/14): LDL 107, HDL 91  PMH: 1. Pulmonary nodule: Stable with serial followup.  2. HTN: Renal artery dopplers in 2012 with no evidence for significant renal artery stenosis.  3. Palpitations: Event monitor (8/14) with no significant abnormalities.  4. Hyperlipidemia 5. CVA: On ASA and Plavix.  6. Secondary hyperparathyroidism 7. GERD 8. Hypothyroidism 9. TAH 10. Back surgery 11. Left carotid bruit: Minor plaque on carotid dopplers in 4/13.  12. CKD: Sees nephrologist at [facility].  13. Peripheral neuropathy.  14. Echo (8/14) with EF 0000000, PA systolic pressure 32 mmHg .  SH: Married, 1 child, nonsmoker, retired Pharmacist, hospital.   FH: CAD both parents.  Current Outpatient Prescriptions  Medication Sig Dispense Refill  . aspirin 81 MG tablet Take 1 tablet (81 mg total) by mouth daily.      . calcitRIOL (ROCALTROL) 0.25 MCG capsule TAKE 1 CAPSULE DAILY  90 capsule  1  . clopidogrel (PLAVIX) 75 MG tablet TAKE 1 TABLET DAILY  90 tablet  2  . gabapentin (NEURONTIN) 100 MG capsule Take 200 mg by mouth daily.      Marland Kitchen levothyroxine (SYNTHROID, LEVOTHROID) 88 MCG tablet TAKE 1 TABLET ONCE DAILY  90 tablet  3  . lisinopril  (PRINIVIL,ZESTRIL) 2.5 MG tablet Take 1 tablet (2.5 mg total) by mouth daily.  90 tablet  0  . metoprolol succinate (TOPROL-XL) 50 MG 24 hr tablet TAKE 1 TABLET DAILY  90 tablet  3  . rosuvastatin (CRESTOR) 10 MG tablet Take 1 tablet (10 mg total) by mouth daily.  90 tablet  0   No current facility-administered medications for this visit.    BP 120/80  Pulse 58  Ht 5\' 1"  (1.549 m)  Wt 126 lb (57.153 kg)  BMI 23.82 kg/m2  SpO2 96% General: NAD Neck: No JVD, no thyromegaly or thyroid nodule.  Lungs: Clear to auscultation bilaterally with normal respiratory effort. CV: Nondisplaced PMI.  Heart regular S1/S2, no S3/S4, no murmur.  No peripheral edema.  Soft left carotid bruit.  Normal pedal pulses.  Abdomen: Soft, nontender, no hepatosplenomegaly, no distention.  Skin: Intact without lesions or rashes.  Neurologic: Alert and oriented x 3.  Psych: Normal affect. Extremities: No clubbing or cyanosis.   Assessment/Plan: 1. Palpitations: Improved.  No significant arrhythmia on cardiac monitor (though she only wore it for a few days.  2. Hyperlipidemia: She has a history of CVA.  She did not tolerate Mevacor years ago.  She has done well so far on Crestor 5 mg daily.  LDL still above goal, will increase Crestor to 10 mg daily.  With her CKD, would not increase Crestor further. Check lipids/LFTs in 2 months.  3. H/o CVA: She is on ASA 81 and Plavix per neurology.  She is on a statin.  4. Carotid stenosis: Bruit on exam.  Mild plaque in 4/13.  Repeat study 4/15.   5. CKD: Sees a nephrologist at [place] 03/17/2013

## 2013-03-18 ENCOUNTER — Ambulatory Visit (INDEPENDENT_AMBULATORY_CARE_PROVIDER_SITE_OTHER): Payer: Medicare Other | Admitting: *Deleted

## 2013-03-18 VITALS — BP 198/85 | HR 68 | Resp 18 | Wt 124.0 lb

## 2013-03-18 DIAGNOSIS — IMO0002 Reserved for concepts with insufficient information to code with codable children: Secondary | ICD-10-CM | POA: Diagnosis not present

## 2013-03-18 DIAGNOSIS — I10 Essential (primary) hypertension: Secondary | ICD-10-CM

## 2013-03-18 MED ORDER — LISINOPRIL 2.5 MG PO TABS: ORAL_TABLET | ORAL | Status: DC

## 2013-03-18 NOTE — Progress Notes (Signed)
Patient walked in to get BP check. She had seen Dr. Ronnald Ramp this morning and during that visit her BP readings were 199/80 and 192/80.  Yesterday she was seen in appointment by Dr. Aundra Dubin and her BP was 120/80.  She states she has a mild ongoing headache but denies swelling, dizziness, SOB, tingling or other signs of stroke. She has hx of CVA/TIA.  She denies dietary changes, increased stressors or medications changes. She states she is compliant with her medications. She states she has a hx of being labile with BP and med changes.  Checked her BP both electronically and manually.  BP's (L) 185/86, 62 reg and (R) 198/85, 68 reg.  Dr. Harrington Challenger reviewed and made the following recommendations and medication changes:  Take extra dose of Lisinopril 2.5mg  by mouth now. Starting tomorrow she should begin Lisinopril 2.5mg  (2) two tabs by mouth each morning and (1) tab at bedtime. Patient to check BP through weekend and then call office to provide update on her status. Patient also advised to seek immediate care via 911 should she experience no improvement in BP during weekend or if she develops any other symptoms or worsening of headache. Patient able to verbalize signs and symptoms of stroke and what to look for. She agreed to recommendations and treatment plan.

## 2013-03-24 DIAGNOSIS — R209 Unspecified disturbances of skin sensation: Secondary | ICD-10-CM | POA: Diagnosis not present

## 2013-03-24 DIAGNOSIS — IMO0002 Reserved for concepts with insufficient information to code with codable children: Secondary | ICD-10-CM | POA: Diagnosis not present

## 2013-05-04 ENCOUNTER — Encounter: Payer: Self-pay | Admitting: Physician Assistant

## 2013-05-04 ENCOUNTER — Ambulatory Visit (INDEPENDENT_AMBULATORY_CARE_PROVIDER_SITE_OTHER): Payer: Medicare Other | Admitting: Physician Assistant

## 2013-05-04 ENCOUNTER — Telehealth: Payer: Self-pay | Admitting: *Deleted

## 2013-05-04 VITALS — BP 120/64 | HR 58 | Ht 61.0 in | Wt 126.0 lb

## 2013-05-04 DIAGNOSIS — Z800 Family history of malignant neoplasm of digestive organs: Secondary | ICD-10-CM | POA: Diagnosis not present

## 2013-05-04 DIAGNOSIS — R194 Change in bowel habit: Secondary | ICD-10-CM

## 2013-05-04 DIAGNOSIS — R198 Other specified symptoms and signs involving the digestive system and abdomen: Secondary | ICD-10-CM

## 2013-05-04 DIAGNOSIS — K5900 Constipation, unspecified: Secondary | ICD-10-CM | POA: Diagnosis not present

## 2013-05-04 DIAGNOSIS — I6350 Cerebral infarction due to unspecified occlusion or stenosis of unspecified cerebral artery: Secondary | ICD-10-CM | POA: Diagnosis not present

## 2013-05-04 MED ORDER — MOVIPREP 100 G PO SOLR: 1.0000 | ORAL | Status: DC

## 2013-05-04 NOTE — Telephone Encounter (Signed)
05/04/2013   RE: Katrina Yang DOB: June 20, 1940 MRN: [id number]   Dear Dr. [name],    We have scheduled the above patient for an endoscopic procedure. Our records show that she is on anticoagulation therapy.   Please advise as to how long the patient may come off her therapy of Plavix prior to the procedure, which is scheduled for 05-20-2013. We normally recommend the patient hold the Plavix for 5 days.   Please fax back/ or route the completed form to [facility] at [phone].   Sincerely,     Amy Esterwood PA-C

## 2013-05-04 NOTE — Progress Notes (Signed)
Subjective:    Patient ID: Katrina Yang, female    DOB: [birth year], 72 y.o.   MRN: [id number]  HPI Katrina Yang is a pleasant 72 year old female known to Dr. Deatra Ina. She has history of a CVA in 2003 and has been maintained on Plavix and aspirin. She also has hypertension and chronic kidney disease. Last EF documented at 55-60%. From a GI standpoint she has diverticulosis and history of adenomatous polyps as well as family history of colon cancer in her mother. Her last colonoscopy was done in June of 2010 this showed scattered tics from the a descending to the sigmoid colon and no polyps. She comes in today with complaints of constipation and change in her bowel habits. She says over the past 6 months she has noticed that her stools seemed to be fairly consistently thinner and more narrow. She has also developed constipation which is a new problem for her. She had an episode of crampy a right-sided abdominal pain on 04/22/2013 which lasted for a couple of hours and then eased off.. She says around that period of time she did not have a bowel movement for 4 days. She did  drink several glasses of prune juice and was finally able to have a bowel movement. Since that time she has remained constipated.  She drank hot prune juice again yesterday and then had a small bowel movement today. She has not noted any melena or hematochezia. She says the abdominal discomfort had completely of resolved about one day and has not returned. Her appetite has been fine her weight has been stable. Patient also relates that she has had some difficulty evacuating her bowels and her bladder since her stroke and feels that she has decreased control of her muscles    Review of Systems  HENT: Negative.   Eyes: Negative.   Respiratory: Negative.   Cardiovascular: Negative.   Gastrointestinal: Positive for abdominal pain and constipation.  Endocrine: Negative.   Genitourinary: Negative.   Musculoskeletal: Negative.   Skin:  Negative.   Allergic/Immunologic: Negative.   Neurological: Positive for weakness.  Hematological: Negative.   Psychiatric/Behavioral: Negative.    Outpatient Prescriptions Prior to Visit  Medication Sig Dispense Refill  . aspirin 81 MG tablet Take 1 tablet (81 mg total) by mouth daily.      . calcitRIOL (ROCALTROL) 0.25 MCG capsule TAKE 1 CAPSULE DAILY  90 capsule  1  . clopidogrel (PLAVIX) 75 MG tablet TAKE 1 TABLET DAILY  90 tablet  2  . gabapentin (NEURONTIN) 100 MG capsule Take 200 mg by mouth daily.      Marland Kitchen levothyroxine (SYNTHROID, LEVOTHROID) 88 MCG tablet TAKE 1 TABLET ONCE DAILY  90 tablet  3  . lisinopril (PRINIVIL,ZESTRIL) 2.5 MG tablet Take two tablets (total dose of 5 mg) by mouth each morning. Take one tablet (total dose 2.5 mg) by mouth at bedtime.  180 tablet  3  . metoprolol succinate (TOPROL-XL) 50 MG 24 hr tablet TAKE 1 TABLET DAILY  90 tablet  3  . rosuvastatin (CRESTOR) 10 MG tablet Take 1 tablet (10 mg total) by mouth daily.  90 tablet  0   No facility-administered medications prior to visit.      Allergies  Allergen Reactions  . Codeine   . Iodine   . Iohexol   . Lovastatin   . Metronidazole     REACTION: Reaction not known  . Sulfamethoxazole-Trimethoprim   . Sulfonamide Derivatives    Patient Active Problem List   Diagnosis  Date Noted  . Anemia 01/20/2013  . Low back pain 01/19/2013  . Abdominal pain, epigastric 07/26/2012  . Nausea alone 07/26/2012  . Numbness 07/21/2012  . CKD (chronic kidney disease) stage 3, GFR 30-59 ml/min 02/25/2012  . Fatigue 01/31/2012  . Shortness of breath 01/02/2012  . Pneumonia, organism unspecified 04/04/2011  . Renal insufficiency 03/04/2011  . Encounter for long-term (current) use of other medications 01/03/2011  . Sinusitis, chronic 10/01/2010  . DYSPHAGIA UNSPECIFIED 05/16/2010  . POLYURIA 05/16/2010  . COUGH 03/18/2010  . TOE PAIN 12/04/2009  . CHEST PAIN-UNSPECIFIED 07/26/2009  . CAROTID BRUIT, LEFT  07/16/2009  . CELLULITIS AND ABSCESS OF FACE 02/19/2009  . 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 Pure hypercholesterolemia 08/17/2008  . HYPERTENSION, BENIGN 08/17/2008  . CVA 08/17/2008  . CEREBROVASCULAR ACCIDENT, HX OF 08/17/2008  . SECONDARY HYPERPARATHYROIDISM 05/29/2008  . PULMONARY NODULE 04/27/2007  . HYPOTHYROIDISM 02/06/2007  . HYPERTENSION 02/06/2007  . GERD 02/06/2007  . DIVERTICULOSIS, COLON 02/06/2007  . DEGENERATIVE JOINT DISEASE, SPINE 02/06/2007  . OSTEOPOROSIS 02/06/2007  . PALPITATIONS 02/06/2007  . COLONIC POLYPS, HX OF 02/06/2007  . UTI'S, HX OF 02/06/2007   History  Substance Use Topics  . Smoking status: Never Smoker   . Smokeless tobacco: Never Used  . Alcohol Use: No    Objective:   Physical Exam  well-developed older white female in no acute distress, pleasant blood pressure 120/64 pulse 58 height 5 foot 1 weight 126. HEENT; nontraumatic cephalic EOMI PERRLA sclera anicteric,Neck; Supple no JVD, Cardiovascular; regular rate and rhythm with S1-S2 no murmur or gallop,Pulm;clear bilaterally, Abdomen; soft nontender nondistended bowel sounds are active there is no palpable mass or hepatosplenomegaly, Rectal; exam not done, Extremities; no clubbing cyanosis or edema skin warm and dry, Psych ;mood and affect normal and appropriate        Assessment & Plan:  #79 72 year old female with new onset of constipation and change in stool caliber x6 months. Rule out symptoms secondary to functional constipation versus occult lesion #2 personal history of adenomatous colon polyps-negative colonoscopy June 2010 #3 diffuse diverticulosis #4 positive family history of colon cancer in patient's mother #5 chronic kidney disease #6 status history of CVA on chronic antiplatelet therapy #7 hypertension #8 hyperlipidemia  Plan; will schedule for followup colonoscopy with Dr. Michelene Heady discussed in detail with the patient and she is agreeable to proceed is Will  obtain consent from her cardiologist Dr. Deatra Robinson  to hold Plavix shortly 5 days prior to her procedure.  In the interim she is advised to drink a glass of warm prune juice each day and if that is not effective to add MiraLax 17 g in 8 ounces of water daily.

## 2013-05-04 NOTE — Patient Instructions (Addendum)
You have been scheduled for a colonoscopy with propofol. Please follow written instructions given to you at your visit today.  Please pick up your prep kit at the pharmacy within the next 1-3 days. We sent the prescription to [facility] [place]. We have given you a coupon to use for the colonoscopy prep. Give this to the pharmacist. If you use inhalers (even only as needed), please bring them with you on the day of your procedure.  Use prune juice daily for constipation.  If not effective you can take Miralax 17 grams in 8 oz of juice or water daily.

## 2013-05-05 NOTE — Progress Notes (Signed)
Reviewed and agree with management. Antanasia Kaczynski D. Tanette Chauca, M.D., FACG  

## 2013-05-06 ENCOUNTER — Other Ambulatory Visit: Payer: Self-pay | Admitting: *Deleted

## 2013-05-09 NOTE — Telephone Encounter (Signed)
She may hold Plavix 5 days prior.

## 2013-05-09 NOTE — Telephone Encounter (Signed)
OK- please inform pt

## 2013-05-09 NOTE — Telephone Encounter (Signed)
I called the patient and advised her to not take the Plavix on 05-15-2013 through to the 16th and she can resume it on 05-21-2013.  Patient verbalized understanding of instructions.

## 2013-05-09 NOTE — Telephone Encounter (Signed)
See phone note on 05-09-2013.

## 2013-05-10 ENCOUNTER — Telehealth: Payer: Self-pay

## 2013-05-10 NOTE — Telephone Encounter (Signed)
Left message to make sure she got her Moviprep with no problems.  It was sent to [facility] but [facility] called with questions about the sig, I canceled the rx with them.

## 2013-05-11 ENCOUNTER — Other Ambulatory Visit: Payer: Self-pay | Admitting: *Deleted

## 2013-05-11 ENCOUNTER — Telehealth: Payer: Self-pay | Admitting: Physician Assistant

## 2013-05-11 NOTE — Telephone Encounter (Signed)
I told the patient I sent the Moviprep because of her kidney problems.  I told her that is the normally only prep Amy Trellis Paganini PA-C will allow me to send to the pharmacies for patients.   She thanked me for calling her back.

## 2013-05-17 ENCOUNTER — Other Ambulatory Visit (INDEPENDENT_AMBULATORY_CARE_PROVIDER_SITE_OTHER): Payer: Medicare Other

## 2013-05-17 DIAGNOSIS — I6350 Cerebral infarction due to unspecified occlusion or stenosis of unspecified cerebral artery: Secondary | ICD-10-CM | POA: Diagnosis not present

## 2013-05-17 DIAGNOSIS — E7800 Pure hypercholesterolemia, unspecified: Secondary | ICD-10-CM

## 2013-05-17 LAB — LIPID PANEL
CHOLESTEROL: 190 mg/dL (ref 0–200)
HDL: 84.4 mg/dL (ref >39.00)
LDL Cholesterol: 93 mg/dL (ref 0–99)
Total CHOL/HDL Ratio: 2
Triglycerides: 62 mg/dL (ref 0.0–149.0)
VLDL: 12.4 mg/dL (ref 0.0–40.0)

## 2013-05-17 LAB — HEPATIC FUNCTION PANEL
ALBUMIN: 3.6 g/dL (ref 3.5–5.2)
ALT: 17 U/L (ref 0–35)
AST: 23 U/L (ref 0–37)
Alkaline Phosphatase: 58 U/L (ref 39–117)
BILIRUBIN DIRECT: 0 mg/dL (ref 0.0–0.3)
TOTAL PROTEIN: 6.8 g/dL (ref 6.0–8.3)
Total Bilirubin: 0.5 mg/dL (ref 0.3–1.2)

## 2013-05-19 ENCOUNTER — Telehealth: Payer: Self-pay | Admitting: Physician Assistant

## 2013-05-19 ENCOUNTER — Ambulatory Visit (INDEPENDENT_AMBULATORY_CARE_PROVIDER_SITE_OTHER): Payer: Medicare Other | Admitting: Gastroenterology

## 2013-05-19 ENCOUNTER — Other Ambulatory Visit (INDEPENDENT_AMBULATORY_CARE_PROVIDER_SITE_OTHER): Payer: Medicare Other

## 2013-05-19 ENCOUNTER — Encounter: Payer: Self-pay | Admitting: Gastroenterology

## 2013-05-19 VITALS — BP 150/96 | HR 64 | Ht 61.4 in | Wt 125.5 lb

## 2013-05-19 DIAGNOSIS — K5900 Constipation, unspecified: Secondary | ICD-10-CM

## 2013-05-19 DIAGNOSIS — R1031 Right lower quadrant pain: Secondary | ICD-10-CM

## 2013-05-19 LAB — CBC WITH DIFFERENTIAL/PLATELET
Basophils Absolute: 0 10*3/uL (ref 0.0–0.1)
Basophils Relative: 0.6 % (ref 0.0–3.0)
EOS PCT: 3.4 % (ref 0.0–5.0)
Eosinophils Absolute: 0.3 10*3/uL (ref 0.0–0.7)
HCT: 34 % — ABNORMAL LOW (ref 36.0–46.0)
HEMOGLOBIN: 11.5 g/dL — AB (ref 12.0–15.0)
LYMPHS PCT: 29.3 % (ref 12.0–46.0)
Lymphs Abs: 2.2 10*3/uL (ref 0.7–4.0)
MCHC: 33.7 g/dL (ref 30.0–36.0)
MCV: 88.7 fl (ref 78.0–100.0)
MONOS PCT: 8.3 % (ref 3.0–12.0)
Monocytes Absolute: 0.6 10*3/uL (ref 0.1–1.0)
NEUTROS ABS: 4.4 10*3/uL (ref 1.4–7.7)
Neutrophils Relative %: 58.4 % (ref 43.0–77.0)
Platelets: 226 10*3/uL (ref 150.0–400.0)
RBC: 3.83 Mil/uL — AB (ref 3.87–5.11)
RDW: 13.6 % (ref 11.5–14.6)
WBC: 7.6 10*3/uL (ref 4.5–10.5)

## 2013-05-19 NOTE — Telephone Encounter (Signed)
Patient states she has had pain on both sides off and on since OV in December. Last night, she woke up with right side pain that is worsen. States it hurts so bad she cannot lay on that side. States she has had changes in her stools but not diarrhea. Denies nausea, vomiting or fever. States she is suppose to start her prep for colonoscopy tomorrow. Scheduled with Alonza Bogus, PA today at 1:30 PM.

## 2013-05-19 NOTE — Patient Instructions (Signed)
Your physician has requested that you go to the basement for the following lab work before leaving today: CBC.   We will call you today with the results of your blood count.

## 2013-05-19 NOTE — Progress Notes (Signed)
05/19/2013 Katrina Yang [id number] Jul 28, 1940   History of Present Illness:  This is a 73 year old female who is known to Dr. Deatra Ina.  She was recently seen on December 31 by one of our PA's, Amy Esterwood, for complaints of constipation and abdominal pain.  See that encounter for further information. She was scheduled for colonoscopy for evaluation and is due to have that procedure tomorrow.  She called this morning and spoke with our nursing staff and stated that she was having significant right lower quadrant abdominal pain. It was recommended that she be seen in our clinic today to be sure that she had nothing acute going on that would prevent her from undergoing colonoscopy tomorrow.  She states that this is the same pain that she had been experiencing previously since December 19, but it had improved/resolved for a while. It hurts on both sides of her lower abdomen but was more apparent on the right side today. She states that it actually hurts with movement and that she could hardly stand up straight earlier today. She denies any nausea, vomiting, fevers, and chills. She denies any urinary complaints.  She says that her bowel movements have still been erratic since December 19 as well, which is the reason that she came for her visit in late December. She denies any blood in her stool, however.   Current Medications, Allergies, Past Medical History, Past Surgical History, Family History and Social History were reviewed in [employer] record.   Physical Exam: BP 150/96  Pulse 64  Ht 5' 1.4" (1.56 m)  Wt 125 lb 8 oz (56.926 kg)  BMI 23.39 kg/m2 General: Well developed, white female in no acute distress; non-toxic appearing. Head: Normocephalic and atraumatic Eyes:  Sclerae anicteric, conjunctiva pink  Ears: Normal auditory acuity Lungs: Clear throughout to auscultation Heart: Regular rate and rhythm Abdomen: Soft, non-distended.  BS present.  Mild right lower  quadrant tenderness to palpation without rebound or guarding.  Rectal: Deferred. Will be done at the time of colonoscopy. Musculoskeletal: Symmetrical with no gross deformities  Extremities: No edema  Neurological: Alert oriented x 4, grossly non-focal Psychological:  Alert and cooperative. Normal mood and affect  Assessment and Recommendations: -RLQ abdominal pain:  I doubt that this is anything acute or worrisome that would prevent her from undergoing colonoscopy tomorrow. She has no fever or chills. She is nontoxic appearing and her abdomen is benign. We will check a CBC to be sure she doesn't have a leukocytosis, but otherwise if that is normal then she'll proceed with colonoscopy tomorrow. If colonoscopy is negative, then Dr. Deatra Ina will decided if he wants a CT scan or further evaluation.

## 2013-05-20 ENCOUNTER — Ambulatory Visit (AMBULATORY_SURGERY_CENTER): Payer: Medicare Other | Admitting: Gastroenterology

## 2013-05-20 ENCOUNTER — Encounter: Payer: Self-pay | Admitting: Gastroenterology

## 2013-05-20 VITALS — BP 142/59 | HR 59 | Temp 96.3°F | Resp 18 | Ht 61.0 in | Wt 126.0 lb

## 2013-05-20 DIAGNOSIS — Z86010 Personal history of colonic polyps: Secondary | ICD-10-CM | POA: Diagnosis not present

## 2013-05-20 DIAGNOSIS — Z800 Family history of malignant neoplasm of digestive organs: Secondary | ICD-10-CM | POA: Diagnosis not present

## 2013-05-20 DIAGNOSIS — Z8673 Personal history of transient ischemic attack (TIA), and cerebral infarction without residual deficits: Secondary | ICD-10-CM | POA: Diagnosis not present

## 2013-05-20 DIAGNOSIS — I10 Essential (primary) hypertension: Secondary | ICD-10-CM | POA: Diagnosis not present

## 2013-05-20 DIAGNOSIS — K5900 Constipation, unspecified: Secondary | ICD-10-CM | POA: Diagnosis not present

## 2013-05-20 DIAGNOSIS — E039 Hypothyroidism, unspecified: Secondary | ICD-10-CM | POA: Diagnosis not present

## 2013-05-20 DIAGNOSIS — K5730 Diverticulosis of large intestine without perforation or abscess without bleeding: Secondary | ICD-10-CM

## 2013-05-20 DIAGNOSIS — D126 Benign neoplasm of colon, unspecified: Secondary | ICD-10-CM

## 2013-05-20 DIAGNOSIS — R1031 Right lower quadrant pain: Secondary | ICD-10-CM | POA: Diagnosis not present

## 2013-05-20 MED ORDER — SODIUM CHLORIDE 0.9 % IV SOLN: 500.0000 mL | INTRAVENOUS | Status: DC

## 2013-05-20 NOTE — Progress Notes (Signed)
Procedure ends, to recovery, report given and VSS. 

## 2013-05-20 NOTE — Patient Instructions (Addendum)
YOU HAD AN ENDOSCOPIC PROCEDURE TODAY AT THE [facility]: Refer to the procedure report that was given to you for any specific questions about what was found during the examination.  If the procedure report does not answer your questions, please call your gastroenterologist to clarify.  If you requested that your care partner not be given the details of your procedure findings, then the procedure report has been included in a sealed envelope for you to review at your convenience later.  YOU SHOULD EXPECT: Some feelings of bloating in the abdomen. Passage of more gas than usual.  Walking can help get rid of the air that was put into your GI tract during the procedure and reduce the bloating. If you had a lower endoscopy (such as a colonoscopy or flexible sigmoidoscopy) you may notice spotting of blood in your stool or on the toilet paper. If you underwent a bowel prep for your procedure, then you may not have a normal bowel movement for a few days.  DIET: Your first meal following the procedure should be a light meal and then it is ok to progress to your normal diet.  A half-sandwich or bowl of soup is an example of a good first meal.  Heavy or fried foods are harder to digest and may make you feel nauseous or bloated.  Likewise meals heavy in dairy and vegetables can cause extra gas to form and this can also increase the bloating.  Drink plenty of fluids but you should avoid alcoholic beverages for 24 hours.  ACTIVITY: Your care partner should take you home directly after the procedure.  You should plan to take it easy, moving slowly for the rest of the day.  You can resume normal activity the day after the procedure however you should NOT DRIVE or use heavy machinery for 24 hours (because of the sedation medicines used during the test).    SYMPTOMS TO REPORT IMMEDIATELY: A gastroenterologist can be reached at any hour.  During normal business hours, 8:30 AM to 5:00 PM Monday through Friday,  call [phone].  After hours and on weekends, please call the GI answering service at [phone] who will take a message and have the physician on call contact you.   Following lower endoscopy (colonoscopy or flexible sigmoidoscopy):  Excessive amounts of blood in the stool  Significant tenderness or worsening of abdominal pains  Swelling of the abdomen that is new, acute  Fever of 100F or higher    FOLLOW UP: If any biopsies were taken you will be contacted by phone or by letter within the next 1-3 weeks.  Call your gastroenterologist if you have not heard about the biopsies in 3 weeks.  Our staff will call the home number listed on your records the next business day following your procedure to check on you and address any questions or concerns that you may have at that time regarding the information given to you following your procedure. This is a courtesy call and so if there is no answer at the home number and we have not heard from you through the emergency physician on call, we will assume that you have returned to your regular daily activities without incident.  SIGNATURES/CONFIDENTIALITY: You and/or your care partner have signed paperwork which will be entered into your electronic medical record.  These signatures attest to the fact that that the information above on your After Visit Summary has been reviewed and is understood.  Full responsibility of the confidentiality   of this discharge information lies with you and/or your care-partner.  Resume plavix today.  Polyp, diverticulosis, high fiber diet information given.

## 2013-05-20 NOTE — Progress Notes (Signed)
Pt. May resume plavix per Dr. Deatra Ina.

## 2013-05-20 NOTE — Op Note (Signed)
[facility]  [address] [place], [address]   COLONOSCOPY PROCEDURE REPORT  PATIENT: Katrina, Yang  MR#: [id number] BIRTHDATE: [birth year] , 72  yrs. old GENDER: Female ENDOSCOPIST: Inda Castle, MD REFERRED BY: PROCEDURE DATE:  05/20/2013 PROCEDURE:   Colonoscopy with cold biopsy polypectomy First Screening Colonoscopy - Avg.  risk and is 50 yrs.  old or older - No.  Prior Negative Screening - Now for repeat screening. N/A  History of Adenoma - Now for follow-up colonoscopy & has been > or = to 3 yrs.  No.  It has been less than 3 yrs since last colonoscopy.  Other: See Comments  Polyps Removed Today? Yes. ASA CLASS:   Class II INDICATIONS:Patient's personal history of colon polyps and Patient's immediate family history of colon cancer.  2010 colonoscopy negative for polyps. MEDICATIONS: MAC sedation, administered by CRNA and propofol (Diprivan) 200mg  IV  DESCRIPTION OF PROCEDURE:   After the risks benefits and alternatives of the procedure were thoroughly explained, informed consent was obtained.  A digital rectal exam revealed no abnormalities of the rectum.   The LB [id number]  endoscope was introduced through the anus and advanced to the cecum, which was identified by both the appendix and ileocecal valve. No adverse events experienced.   The quality of the prep was excellent using Suprep  The instrument was then slowly withdrawn as the colon was fully examined.      COLON FINDINGS: A sessile polyp measuring 2 mm in size was found at the cecum.  A polypectomy was performed with cold forceps.   Mild diverticulosis was noted in the ascending colon.   Moderate diverticulosis was noted in the sigmoid colon.   The colon was otherwise normal.  There was no diverticulosis, inflammation, polyps or cancers unless previously stated.  Retroflexed views revealed no abnormalities. The time to cecum=3 minutes 30 seconds. Withdrawal time=6 minutes 54  seconds.  The scope was withdrawn and the procedure completed. COMPLICATIONS: There were no complications.  ENDOSCOPIC IMPRESSION: 1.   Sessile polyp measuring 2 mm in size was found at the cecum; polypectomy was performed with cold forceps 2.   Mild diverticulosis was noted in the ascending colon 3.   Moderate diverticulosis was noted in the sigmoid colon 4.   The colon was otherwise normal  RECOMMENDATIONS: Given your significant family history of colon cancer, you should have a repeat colonoscopy in 5 years   eSigned:  Inda Castle, MD 05/20/2013 3:20 PM   cc: Donavan Foil, MD and Marylynn Pearson MD   PATIENT NAME:  Katrina, Yang MR#: [id number]

## 2013-05-20 NOTE — Progress Notes (Signed)
Reviewed and agree with management. Kaeden Depaz D. Dwyne Hasegawa, M.D., FACG  

## 2013-05-20 NOTE — Progress Notes (Signed)
Called to room to assist during endoscopic procedure.  Patient ID and intended procedure confirmed with present staff. Received instructions for my participation in the procedure from the performing physician. ewm 

## 2013-05-23 ENCOUNTER — Telehealth: Payer: Self-pay | Admitting: *Deleted

## 2013-05-23 ENCOUNTER — Other Ambulatory Visit: Payer: Self-pay | Admitting: Cardiology

## 2013-05-23 NOTE — Telephone Encounter (Signed)
  Follow up Call-  Call back number 05/20/2013  Post procedure Call Back phone  # [phone]  Permission to leave phone message Yes     Patient questions:  Do you have a fever, pain , or abdominal swelling? no Pain Score  0 *  Have you tolerated food without any problems?yes  Have you been able to return to your normal activities? yes  Do you have any questions about your discharge instructions: Diet   no Medications  no Follow up visit  no  Do you have questions or concerns about your Care? no  Actions: * If pain score is 4 or above: No action needed, pain <4.

## 2013-05-24 ENCOUNTER — Encounter: Payer: Self-pay | Admitting: Endocrinology

## 2013-05-24 DIAGNOSIS — N189 Chronic kidney disease, unspecified: Secondary | ICD-10-CM | POA: Diagnosis not present

## 2013-05-24 DIAGNOSIS — N390 Urinary tract infection, site not specified: Secondary | ICD-10-CM | POA: Diagnosis not present

## 2013-05-27 ENCOUNTER — Encounter: Payer: Self-pay | Admitting: Gastroenterology

## 2013-05-30 DIAGNOSIS — N189 Chronic kidney disease, unspecified: Secondary | ICD-10-CM | POA: Diagnosis not present

## 2013-06-16 ENCOUNTER — Other Ambulatory Visit: Payer: Self-pay | Admitting: Diagnostic Neuroimaging

## 2013-07-19 DIAGNOSIS — N183 Chronic kidney disease, stage 3 unspecified: Secondary | ICD-10-CM | POA: Diagnosis not present

## 2013-07-19 DIAGNOSIS — Q619 Cystic kidney disease, unspecified: Secondary | ICD-10-CM | POA: Diagnosis not present

## 2013-07-20 DIAGNOSIS — N281 Cyst of kidney, acquired: Secondary | ICD-10-CM | POA: Insufficient documentation

## 2013-08-09 DIAGNOSIS — N2581 Secondary hyperparathyroidism of renal origin: Secondary | ICD-10-CM | POA: Diagnosis not present

## 2013-08-09 DIAGNOSIS — Q619 Cystic kidney disease, unspecified: Secondary | ICD-10-CM | POA: Diagnosis not present

## 2013-08-09 DIAGNOSIS — D631 Anemia in chronic kidney disease: Secondary | ICD-10-CM | POA: Diagnosis not present

## 2013-08-09 DIAGNOSIS — I129 Hypertensive chronic kidney disease with stage 1 through stage 4 chronic kidney disease, or unspecified chronic kidney disease: Secondary | ICD-10-CM | POA: Diagnosis not present

## 2013-08-09 DIAGNOSIS — E559 Vitamin D deficiency, unspecified: Secondary | ICD-10-CM | POA: Diagnosis not present

## 2013-08-09 DIAGNOSIS — N183 Chronic kidney disease, stage 3 unspecified: Secondary | ICD-10-CM | POA: Diagnosis not present

## 2013-08-09 DIAGNOSIS — N039 Chronic nephritic syndrome with unspecified morphologic changes: Secondary | ICD-10-CM | POA: Diagnosis not present

## 2013-08-11 ENCOUNTER — Other Ambulatory Visit: Payer: Self-pay | Admitting: Endocrinology

## 2013-08-16 ENCOUNTER — Encounter: Payer: Medicare Other | Admitting: Endocrinology

## 2013-08-17 ENCOUNTER — Encounter: Payer: Self-pay | Admitting: *Deleted

## 2013-08-17 ENCOUNTER — Ambulatory Visit (INDEPENDENT_AMBULATORY_CARE_PROVIDER_SITE_OTHER): Payer: Medicare Other | Admitting: Cardiology

## 2013-08-17 ENCOUNTER — Encounter: Payer: Self-pay | Admitting: Cardiology

## 2013-08-17 ENCOUNTER — Encounter (HOSPITAL_COMMUNITY): Payer: Medicare Other

## 2013-08-17 ENCOUNTER — Ambulatory Visit (HOSPITAL_COMMUNITY): Payer: Medicare Other | Attending: Cardiology | Admitting: *Deleted

## 2013-08-17 VITALS — BP 170/76 | HR 74 | Ht 61.0 in | Wt 127.0 lb

## 2013-08-17 DIAGNOSIS — I6529 Occlusion and stenosis of unspecified carotid artery: Secondary | ICD-10-CM | POA: Diagnosis not present

## 2013-08-17 DIAGNOSIS — N183 Chronic kidney disease, stage 3 unspecified: Secondary | ICD-10-CM | POA: Diagnosis not present

## 2013-08-17 DIAGNOSIS — I6350 Cerebral infarction due to unspecified occlusion or stenosis of unspecified cerebral artery: Secondary | ICD-10-CM

## 2013-08-17 DIAGNOSIS — R5381 Other malaise: Secondary | ICD-10-CM

## 2013-08-17 DIAGNOSIS — I10 Essential (primary) hypertension: Secondary | ICD-10-CM | POA: Insufficient documentation

## 2013-08-17 DIAGNOSIS — R5383 Other fatigue: Secondary | ICD-10-CM

## 2013-08-17 DIAGNOSIS — E785 Hyperlipidemia, unspecified: Secondary | ICD-10-CM | POA: Insufficient documentation

## 2013-08-17 DIAGNOSIS — Z8673 Personal history of transient ischemic attack (TIA), and cerebral infarction without residual deficits: Secondary | ICD-10-CM | POA: Insufficient documentation

## 2013-08-17 DIAGNOSIS — I658 Occlusion and stenosis of other precerebral arteries: Secondary | ICD-10-CM | POA: Diagnosis not present

## 2013-08-17 DIAGNOSIS — R55 Syncope and collapse: Secondary | ICD-10-CM

## 2013-08-17 DIAGNOSIS — R0602 Shortness of breath: Secondary | ICD-10-CM

## 2013-08-17 DIAGNOSIS — R0989 Other specified symptoms and signs involving the circulatory and respiratory systems: Secondary | ICD-10-CM

## 2013-08-17 DIAGNOSIS — E7800 Pure hypercholesterolemia, unspecified: Secondary | ICD-10-CM

## 2013-08-17 DIAGNOSIS — R531 Weakness: Secondary | ICD-10-CM

## 2013-08-17 LAB — TSH: TSH: 0.16 u[IU]/mL — AB (ref 0.35–5.50)

## 2013-08-17 NOTE — Patient Instructions (Signed)
Your physician recommends that you have  lab work today--TSH.  Your physician has requested that you have an echocardiogram. Echocardiography is a painless test that uses sound waves to create images of your heart. It provides your doctor with information about the size and shape of your heart and how well your heart's chambers and valves are working. This procedure takes approximately one hour. There are no restrictions for this procedure.  Your physician has requested that you regularly monitor and record your blood pressure readings at home. Please use the same machine at the same time of day to check your readings and record them. I will call you in 2 weeks to get the readings. Desiree Lucy, RN  Your physician wants you to follow-up in: 1 year with Dr Aundra Dubin. (April 2016).  You will receive a reminder letter in the mail two months in advance. If you don't receive a letter, please call our office to schedule the follow-up appointment.

## 2013-08-17 NOTE — Progress Notes (Signed)
Carotid Duplex Complete 

## 2013-08-17 NOTE — Progress Notes (Signed)
Patient ID: Katrina Yang, female   DOB: Nov 09, 1940, 73 y.o.   MRN: [id number] PCP: Dr. Loanne Drilling  73 yo with history of HTN and CVA presents for cardiology followup.  She has a history of labile blood pressure.  BP is high today in the office, but her home readings have shown SBP 120s-130s.  She continues to report "weak spells."  She will feel tired/fatigued with these spells but denies lightheadedness.  No tachypalpitations.  Event monitor in 8/14 showed no significant abnormalities and echo showed normal EF.    She is active and does a fair amount of walking and yardwork.  No chest pain or exertional dyspnea.  She has tolerated Crestor 5 mg daily without myalgias. She is limited mainly by back pain/sciatica.   Labs (9/13): creatinine 1.9, LDL 155, HDL 82 Labs (9/14): K 4.6, creatinine 2.1 Labs (10/14): LDL 107, HDL 91 Labs (1/15): LDL 93, HDL 84 Labs (4/15): K 4.8, creatinine 2.13, HCT 34.3  PMH: 1. Pulmonary nodule: Stable with serial followup.  2. HTN: Renal artery dopplers in 2012 with no evidence for significant renal artery stenosis.  3. Palpitations: Event monitor (8/14) with no significant abnormalities.  4. Hyperlipidemia 5. CVA: On ASA and Plavix.  6. Secondary hyperparathyroidism 7. GERD 8. Hypothyroidism 9. TAH 10. Back surgery 11. Left carotid bruit: Minor plaque on carotid dopplers in 4/13.  12. CKD: Sees nephrologist at [facility].  13. Peripheral neuropathy.  14. Echo (8/14) with EF 0000000, PA systolic pressure 32 mmHg .  SH: Married, 1 child, nonsmoker, retired Pharmacist, hospital.   FH: CAD both parents.  ROS: All systems reviewed and negative except as per HPI.   Current Outpatient Prescriptions  Medication Sig Dispense Refill  . aspirin 81 MG tablet Take 1 tablet (81 mg total) by mouth daily.      . calcitRIOL (ROCALTROL) 0.25 MCG capsule TAKE 1 CAPSULE DAILY  90 capsule  0  . clopidogrel (PLAVIX) 75 MG tablet TAKE 1 TABLET DAILY  90 tablet  2  . gabapentin  (NEURONTIN) 100 MG capsule Take 100 mg by mouth daily.      Marland Kitchen levothyroxine (SYNTHROID, LEVOTHROID) 88 MCG tablet TAKE 1 TABLET ONCE DAILY  90 tablet  3  . lisinopril (PRINIVIL,ZESTRIL) 2.5 MG tablet . Take one tablet (total dose 2.5 mg) by mouth at bedtime.      . metoprolol succinate (TOPROL-XL) 50 MG 24 hr tablet Take 1/2 tab twice a day      . rosuvastatin (CRESTOR) 10 MG tablet Take 1/2 tab daily       No current facility-administered medications for this visit.    BP 170/76  Pulse 74  Ht 5\' 1"  (1.549 m)  Wt 57.607 kg (127 lb)  BMI 24.01 kg/m2 General: NAD Neck: No JVD, no thyromegaly or thyroid nodule.  Lungs: Clear to auscultation bilaterally with normal respiratory effort. CV: Nondisplaced PMI.  Heart regular S1/S2, no S3/S4, no murmur.  No peripheral edema.  Soft left carotid bruit.  Normal pedal pulses.  Abdomen: Soft, nontender, no hepatosplenomegaly, no distention.  Skin: Intact without lesions or rashes.  Neurologic: Alert and oriented x 3.  Psych: Normal affect. Extremities: No clubbing or cyanosis.   Assessment/Plan: 1. "Weak spells": Uncertain etiology.  These have been going on for a long time.  No significant arrhythmia on cardiac monitor in 8/14 (though she only wore it for a few days). Echo was unremarkable.  CBC ok recently.  I will check TSH today.  I do  not have any evidence for a cardiac cause of these spells.  She had a lot of trouble with the event monitor in 8/14 and would not wear one again.  2. Hyperlipidemia: She has a history of CVA.  She did not tolerate Mevacor years ago.  She has done well so far on Crestor 5 mg daily.  LDL 93 in 1/15. 3. H/o CVA: She is on ASA 81 and Plavix per neurology.  She is on a statin.  4. Carotid stenosis: Bruit on exam.  Mild plaque in 4/13.  Repeat study today.   5. CKD: Sees a nephrologist at [facility].  6. HTN: BP is high today but she says it has been ok at home.  She will check BP daily and we will call to see what her  BP runs at home.   Larey Dresser 08/17/2013

## 2013-08-22 ENCOUNTER — Other Ambulatory Visit: Payer: Self-pay | Admitting: *Deleted

## 2013-08-22 ENCOUNTER — Telehealth: Payer: Self-pay | Admitting: Diagnostic Neuroimaging

## 2013-08-22 DIAGNOSIS — R5383 Other fatigue: Principal | ICD-10-CM

## 2013-08-22 DIAGNOSIS — R5381 Other malaise: Secondary | ICD-10-CM

## 2013-08-22 NOTE — Telephone Encounter (Signed)
Patient calling to state that she wrote a letter about 3 weeks ago requesting change of provider from Dr. Leta Baptist to Dr. Jannifer Franklin since her husband sees Dr. Jannifer Franklin. Patient states she needs an appointment but the last time she called she was told that there was no record of the letter she sent. Please call and advise patient.

## 2013-08-22 NOTE — Telephone Encounter (Signed)
Looking in [employer] EMR , I see last time seen 03-19-12.  Last note speaks of who to see.  Dr. Erling Cruz recommended Dr. Leta Baptist.   I do not see in EPIC that Dr. Leta Baptist as seen pt.  Ok to [facility] (former [facility] pt ) with Dr. Jannifer Franklin.

## 2013-08-23 ENCOUNTER — Telehealth: Payer: Self-pay | Admitting: Neurology

## 2013-08-23 ENCOUNTER — Telehealth: Payer: Self-pay | Admitting: *Deleted

## 2013-08-23 NOTE — Telephone Encounter (Signed)
Pt calling stating that she needs to get in sooner to be seen by Dr. Jannifer Franklin, former Dr. Erling Cruz pt, pt was offered first available appt in September. Pt states that she is having severe headaches. Last OV with Dr. Erling Cruz was 03/19/12. Please advise

## 2013-08-23 NOTE — Telephone Encounter (Signed)
I called patient. 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 The patient still has some left-sided headaches, which are unusual for her. The patient wants an earlier work in revisit. I will try to get something set up.

## 2013-08-23 NOTE — Telephone Encounter (Signed)
Patient called to schedule appt with Dr. Jannifer Franklin (former [facility] pt) as instructed to do.  First appt available was in Sept, she feels like to needs to been before then due to experiencing headaches and very concerned.  Please contact patient...thanks

## 2013-08-23 NOTE — Telephone Encounter (Signed)
Patient is former [facility] patient, left message for patient to call and schedule office visit with Dr. Jannifer Franklin.

## 2013-08-29 ENCOUNTER — Ambulatory Visit (HOSPITAL_COMMUNITY): Payer: Medicare Other | Attending: Internal Medicine | Admitting: Radiology

## 2013-08-29 ENCOUNTER — Encounter: Payer: Self-pay | Admitting: Internal Medicine

## 2013-08-29 ENCOUNTER — Other Ambulatory Visit (INDEPENDENT_AMBULATORY_CARE_PROVIDER_SITE_OTHER): Payer: Medicare Other

## 2013-08-29 DIAGNOSIS — R002 Palpitations: Secondary | ICD-10-CM

## 2013-08-29 DIAGNOSIS — R55 Syncope and collapse: Secondary | ICD-10-CM

## 2013-08-29 DIAGNOSIS — R0609 Other forms of dyspnea: Secondary | ICD-10-CM | POA: Diagnosis not present

## 2013-08-29 DIAGNOSIS — R5383 Other fatigue: Principal | ICD-10-CM

## 2013-08-29 DIAGNOSIS — R0989 Other specified symptoms and signs involving the circulatory and respiratory systems: Secondary | ICD-10-CM | POA: Diagnosis not present

## 2013-08-29 DIAGNOSIS — R5381 Other malaise: Secondary | ICD-10-CM

## 2013-08-29 DIAGNOSIS — R0602 Shortness of breath: Secondary | ICD-10-CM

## 2013-08-29 LAB — T4, FREE: FREE T4: 1.39 ng/dL (ref 0.60–1.60)

## 2013-08-29 LAB — T3, FREE: T3, Free: 2.6 pg/mL (ref 2.3–4.2)

## 2013-08-29 LAB — TSH: TSH: 0.24 u[IU]/mL — ABNORMAL LOW (ref 0.35–5.50)

## 2013-08-29 NOTE — Progress Notes (Signed)
Echocardiogram performed.  

## 2013-08-30 ENCOUNTER — Ambulatory Visit (INDEPENDENT_AMBULATORY_CARE_PROVIDER_SITE_OTHER): Payer: Medicare Other | Admitting: Neurology

## 2013-08-30 ENCOUNTER — Encounter: Payer: Self-pay | Admitting: Neurology

## 2013-08-30 VITALS — BP 173/84 | HR 66 | Wt 126.0 lb

## 2013-08-30 DIAGNOSIS — I6529 Occlusion and stenosis of unspecified carotid artery: Secondary | ICD-10-CM | POA: Diagnosis not present

## 2013-08-30 DIAGNOSIS — M531 Cervicobrachial syndrome: Secondary | ICD-10-CM

## 2013-08-30 DIAGNOSIS — M5481 Occipital neuralgia: Secondary | ICD-10-CM

## 2013-08-30 HISTORY — DX: Occipital neuralgia: M54.81

## 2013-08-30 NOTE — Patient Instructions (Signed)
Occipital Neuralgia Neuralgias are attacks of sharp stabbing pain. They may be intermittent (comes and goes) or constant in nature. They may be brief attacks that last seconds to minutes and may come back for days to weeks. The neuralgias can occur as a result of a herpes zoster (shingles), chickenpox infection, or even following a herpes simplex infection (cold sore). TYPES OF NEURALGIA  When these pains are located in the back of the head and neck they are called occipital neuralgias.  When the pain is located between ribs it is called intercostal neuralgia.  When the pain is located in the face it is called trigeminal neuralgia. This is the most common neuralgia. It causes sharp, shock like pain on one side of your face. The neuralgias, which follow herpes zoster infections, often produce a constant burning pain. They may last from weeks to months and even years. The attacks of pain may come from injury or inflammation (irritation) to a nerve. Often the cause is unknown. The episodes of pain may be caused by light touch, movement, or even eating and sneezing. Usually these neuralgias occur after age 52. The neuralgias following shingles and trigeminal neuralgia are the most common. Although painful, these episodes do not threaten life and tend to lessen as we grow older. TREATMENT  There are many medications that may be helpful in the treatment of this disorder. Sometimes several medications may have to be tried before the right combination can be found for you. Some of these medications are:  Only take over-the-counter or prescription medications for pain, discomfort, or fever as directed by your caregiver.  Narcotic medications may be used to control the pain.  Antidepressants and medications used in epilepsy (seizure disorders) may be useful. LET YOUR CAREGIVER KNOW ABOUT:  If you do not obtain relief from medications.  Problems that are getting worse rather than better.  Troubling  side effects that you think are coming from the medication. Do not be discouraged if you do not obtain instant relief from the medications or help given you. Your caregiver can help you get through these episodes of pain with some persistence (continued trying) on your part also. Document Released: 04/15/2001 Document Revised: 07/14/2011 Document Reviewed: 04/21/2005 [facility], [place] Patient Information 2014 [place].

## 2013-08-30 NOTE — Progress Notes (Signed)
Reason for visit: Left-sided head pain  Katrina Yang is an 73 y.o. female  History of present illness:  Katrina Yang is a 73 year old right-handed white female with a history of hypertension, dyslipidemia, coronary artery disease, and cerebrovascular disease. The patient has some issues with chronic left sided headaches, and neck discomfort. She also has some back pain, left leg discomfort. The patient had an event 2 weeks ago where she had a sharp shock like sensation that came up the back of the head to the top of the head, referring pain behind the eye. The episode lasted only a few seconds. The patient went and looked in the mirror, and noted a scleral hemorrhage in the medial aspect of her left eye. This has since resolved. The episode of the shock sensations has not recurred. The patient does have her usual achy pain in the left side the head. No other issues such as numbness or weakness of extremities was noted. The patient has since undergone a 2-D echocardiogram showed an ejection fraction of 55-60%, otherwise unremarkable. Carotid Doppler studies were done, and were unremarkable. The patient comes to this office for an evaluation. She denies problems controlling the bowels or the bladder. Within the last year, MRI evaluation of the brain has been done, and a cervical spine MRI was done. This shows a mild to moderate level of small vessel ischemic changes.  Past Medical History  Diagnosis Date  . Hypertension   . Palpitations   . Pure hypercholesterolemia   . CVA (cerebral vascular accident)     hx of  . Secondary hyperparathyroidism (of renal origin)   . Pulmonary nodule   . GERD (gastroesophageal reflux disease)   . Diverticulosis of colon (without mention of hemorrhage)   . Colonic polyp     hx of  . Degenerative joint disease   . Personal history of unspecified urinary disorder   . Osteoporosis, unspecified   . Unspecified hypothyroidism   . History of hysterectomy   .  Occipital neuralgia of left side 08/30/2013    Past Surgical History  Procedure Laterality Date  . Back surgery      diskectomy L-5. Multiple back surgeries  . Abdominal hysterectomy      Family History  Problem Relation Age of Onset  . Kidney cancer Father   . Heart attack Father   . Heart disease Father   . Colon cancer Mother   . Ovarian cancer Mother   . Heart disease Mother   . Heart failure Mother   . Stomach cancer Neg Hx     Social history:  reports that she has never smoked. She has never used smokeless tobacco. She reports that she does not drink alcohol or use illicit drugs.    Allergies  Allergen Reactions  . Codeine   . Iodine   . Iohexol   . Lovastatin   . Metronidazole     REACTION: Reaction not known  . Sulfamethoxazole-Trimethoprim   . Sulfonamide Derivatives   . Latex Rash    rash    Medications:  Current Outpatient Prescriptions on File Prior to Visit  Medication Sig Dispense Refill  . aspirin 81 MG tablet Take 1 tablet (81 mg total) by mouth daily.      . calcitRIOL (ROCALTROL) 0.25 MCG capsule TAKE 1 CAPSULE DAILY  90 capsule  0  . clopidogrel (PLAVIX) 75 MG tablet TAKE 1 TABLET DAILY  90 tablet  2  . gabapentin (NEURONTIN) 100 MG capsule Take  100 mg by mouth daily.      Marland Kitchen levothyroxine (SYNTHROID, LEVOTHROID) 88 MCG tablet TAKE 1 TABLET ONCE DAILY  90 tablet  3  . lisinopril (PRINIVIL,ZESTRIL) 2.5 MG tablet . Take one tablet (total dose 2.5 mg) by mouth at bedtime.      . metoprolol succinate (TOPROL-XL) 50 MG 24 hr tablet Take 1/2 tab twice a day      . rosuvastatin (CRESTOR) 10 MG tablet 5 mg. Take 1/2 tab daily       No current facility-administered medications on file prior to visit.    ROS:  Out of a complete 14 system review of symptoms, the patient complains only of the following symptoms, and all other reviewed systems are negative.  Dizziness, headache, numbness Weakness   Blood pressure 173/84, pulse 66, weight 126 lb  (57.153 kg).  Physical Exam  General: The patient is alert and cooperative at the time of the examination.  Neuromuscular: The patient lacks about 15 of lateral rotation of the cervical spine bilaterally.  Skin: No significant peripheral edema is noted.   Neurologic Exam  Mental status: The patient is oriented x 3.  Cranial nerves: Facial symmetry is present. Speech is normal, no aphasia or dysarthria is noted. Extraocular movements are full. Visual fields are full.  Motor: The patient has good strength in all 4 extremities.  Sensory examination: Soft touch sensation is symmetric on the face, arms, or legs.  Coordination: The patient has good finger-nose-finger and heel-to-shin bilaterally.  Gait and station: The patient has a normal gait. Tandem gait is normal. Romberg is negative. No drift is seen.  Reflexes: Deep tendon reflexes are symmetric.   MRI brain 08/11/2012:  IMPRESSION: Abnormal MRI scan of the brain showing mild changes of chronic microvascular ischemia. Overall slight progression of these changes compared with previous MRI scan dated 11/24/2007.     Assessment/Plan:  1. Left occipital neuralgia  2. Cerebrovascular disease  The patient appears to have had an episode of neuralgia type pain involving the left occipital nerve. This has resolved, but if the issue becomes recurrent in nature, she may require further therapy. The patient will contact our office if needed.  Katrina Alexanders MD 08/30/2013 6:52 PM  [facility] [address] [place], [place] [address]  Phone [phone] Fax [phone]

## 2013-08-31 ENCOUNTER — Encounter: Payer: Self-pay | Admitting: Internal Medicine

## 2013-08-31 ENCOUNTER — Telehealth: Payer: Self-pay | Admitting: Cardiology

## 2013-08-31 NOTE — Telephone Encounter (Signed)
Pt given echo and lab results.  Pt states she has been checking her BP at home --it has been 125-133/70-80. She thinks she has "white coat" because it was high at neurologist's office yesterday but never at home.  Pt advised to continue to check BP and call if it is consistently over 140/90.

## 2013-08-31 NOTE — Telephone Encounter (Signed)
Dr Aundra Dubin has not reviewed the report yet.

## 2013-08-31 NOTE — Telephone Encounter (Signed)
Pt advised will call her when report is reviewed by Dr Aundra Dubin.

## 2013-08-31 NOTE — Telephone Encounter (Signed)
Patient is here with her husband today.  She would like for you to let her know what her test results are.  Mr. Tenn is having a stress test so she will be here a while.

## 2013-09-01 ENCOUNTER — Other Ambulatory Visit: Payer: Self-pay | Admitting: Endocrinology

## 2013-09-01 MED ORDER — LEVOTHYROXINE SODIUM 75 MCG PO TABS: 75.0000 ug | ORAL_TABLET | Freq: Every day | ORAL | Status: DC

## 2013-09-10 ENCOUNTER — Other Ambulatory Visit: Payer: Self-pay | Admitting: Diagnostic Neuroimaging

## 2013-09-22 IMAGING — CR DG CHEST 2V
2 series · 2 of 2 positions shown · non-contrast
Comparison: 01/03/2011

CLINICAL DATA: Follow-up pneumonia.  Left chest pain, cough.

CHEST - 2 VIEW

[view not recorded (1 of 2)]
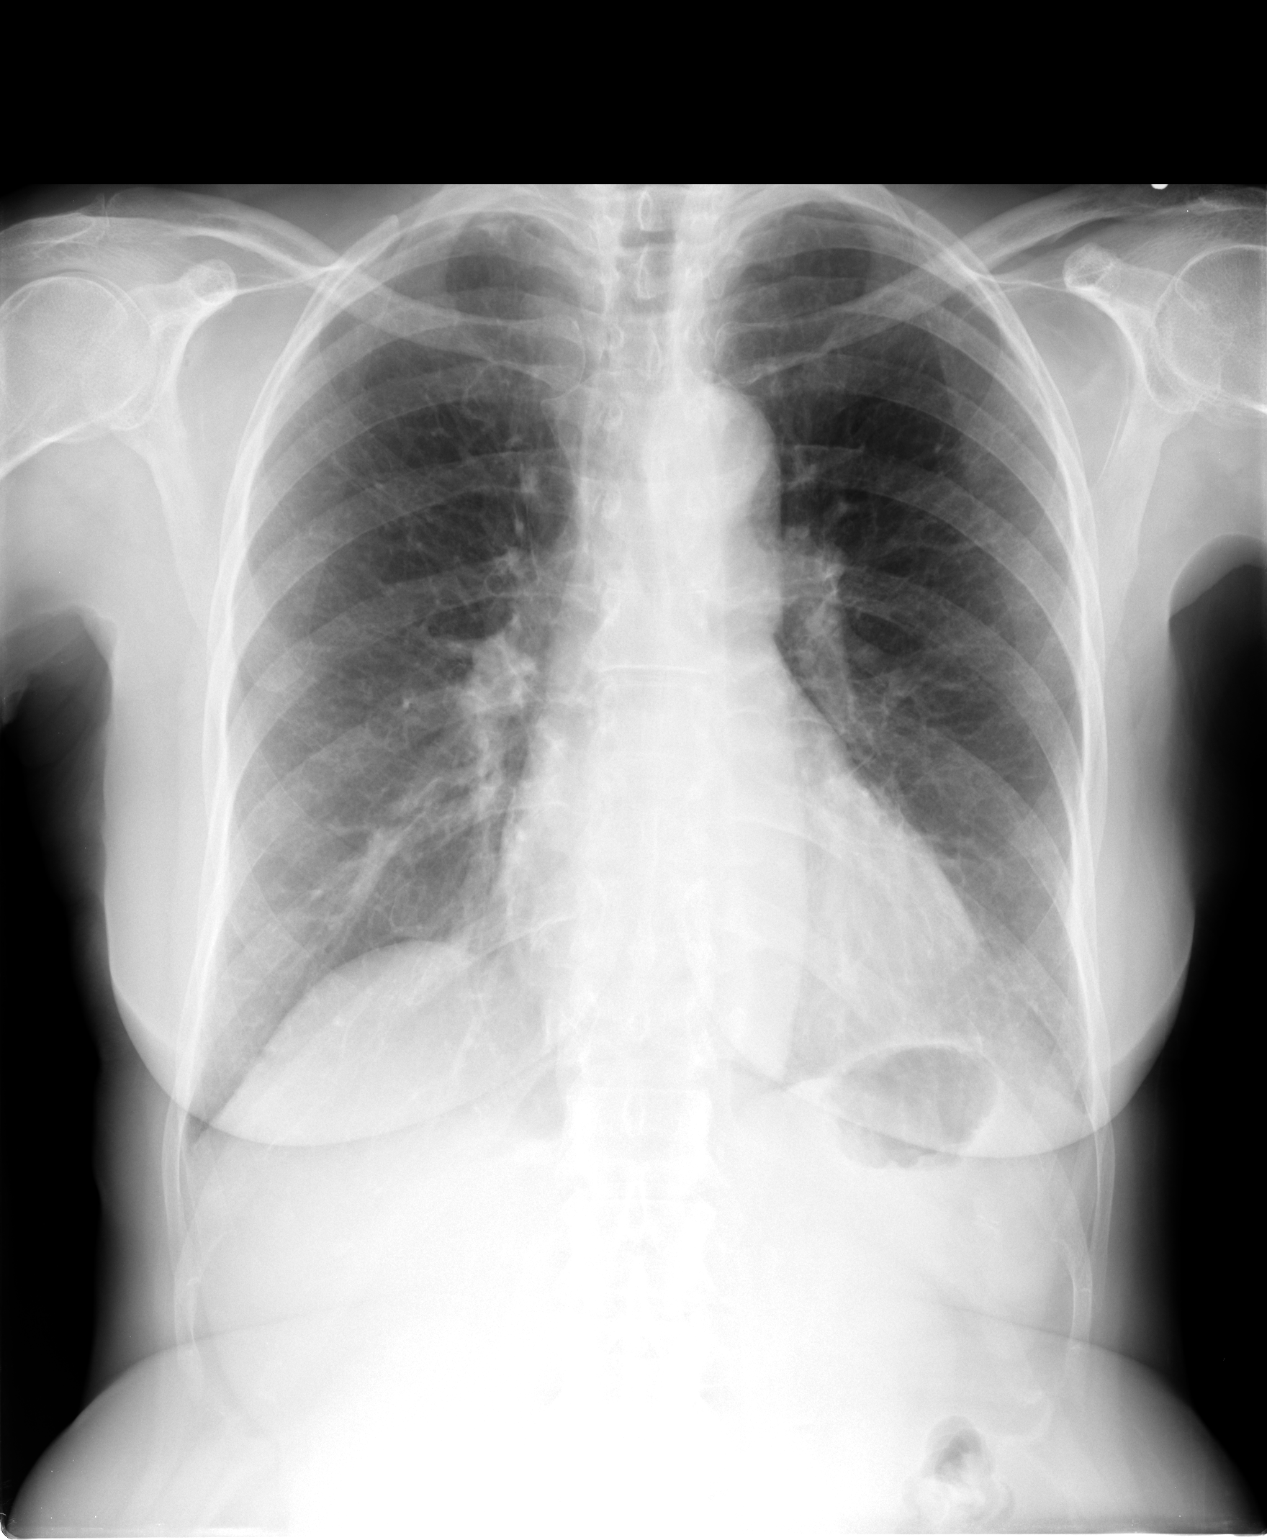

[view not recorded (2 of 2)]
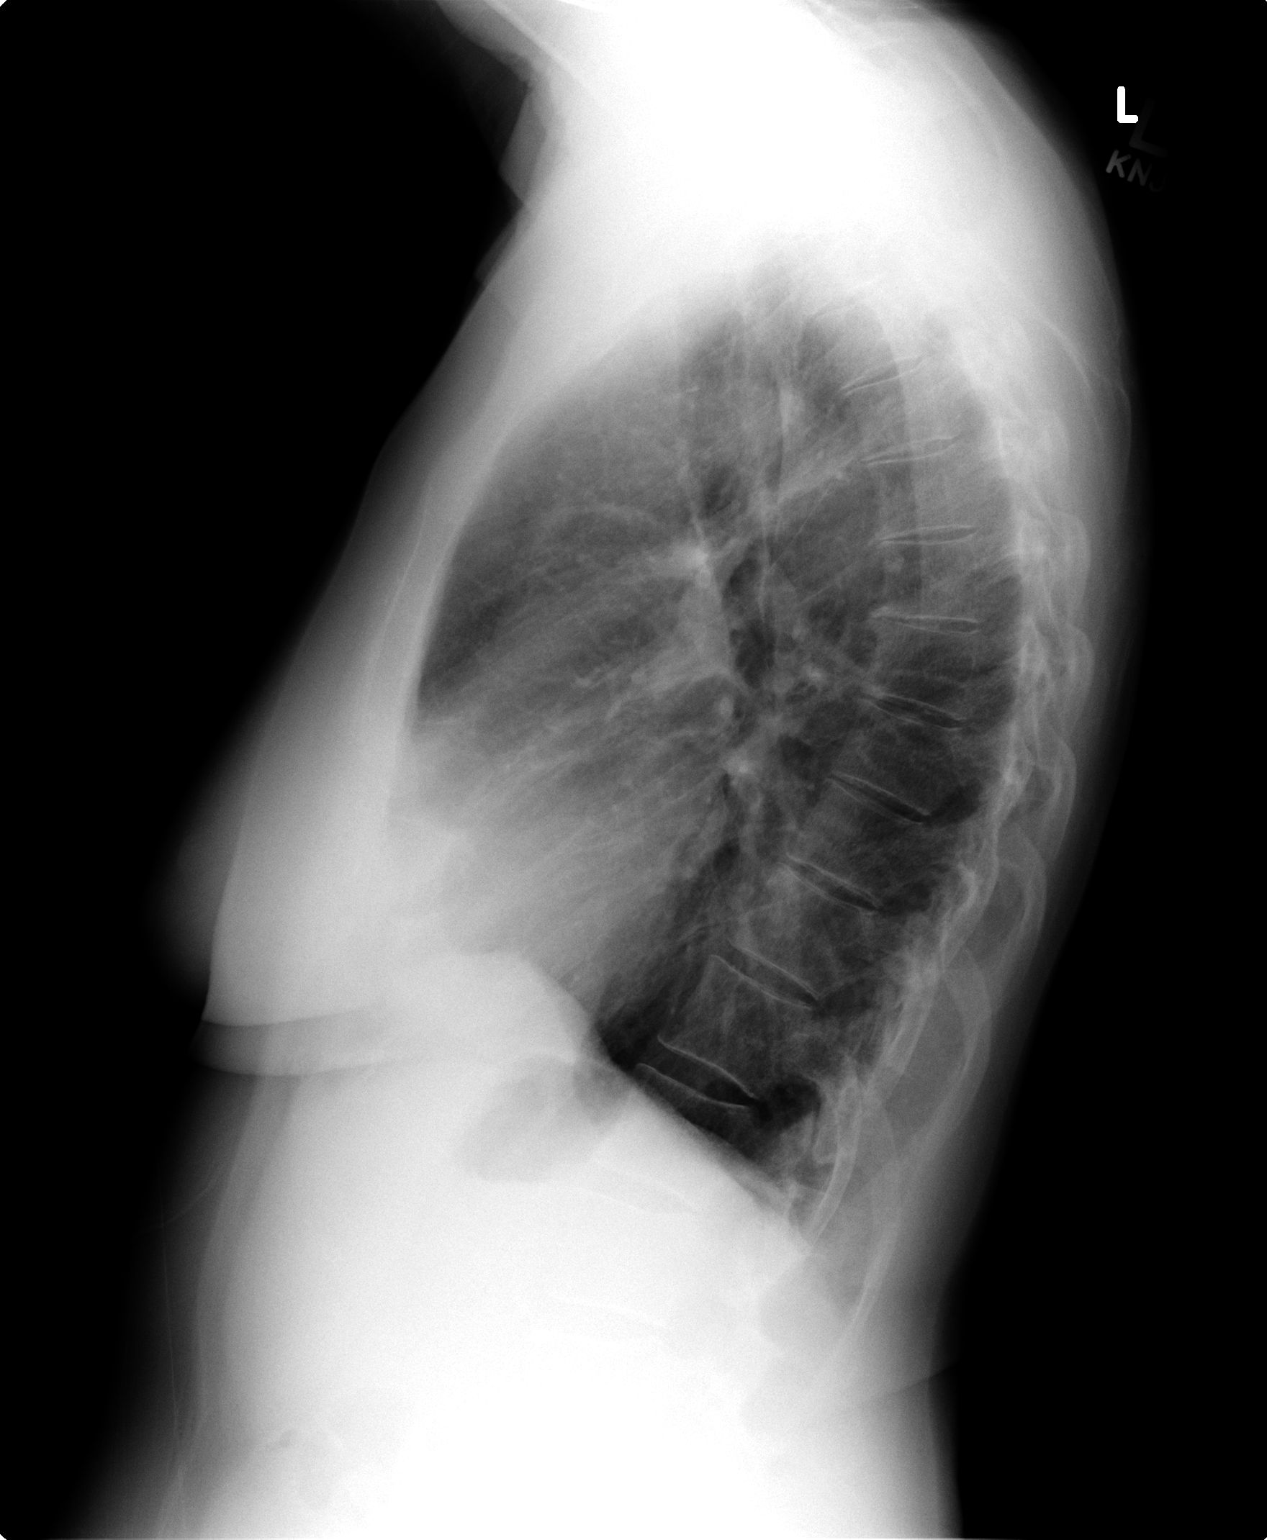

[2 of 2 positions shown; findings below may reference images not displayed]

FINDINGS: Stable mild hyperinflation of the lungs.  No confluent
opacities in the lungs.  No effusions.  Heart is upper limits
normal in size.  No acute bony abnormality.
IMPRESSION: Stable hyperinflation.  No active disease.  No change.

## 2013-09-30 NOTE — Progress Notes (Signed)
This encounter was created in error - please disregard.

## 2013-10-04 DIAGNOSIS — H5200 Hypermetropia, unspecified eye: Secondary | ICD-10-CM | POA: Diagnosis not present

## 2013-10-04 DIAGNOSIS — H2510 Age-related nuclear cataract, unspecified eye: Secondary | ICD-10-CM | POA: Diagnosis not present

## 2013-10-04 DIAGNOSIS — H40019 Open angle with borderline findings, low risk, unspecified eye: Secondary | ICD-10-CM | POA: Diagnosis not present

## 2013-11-09 ENCOUNTER — Other Ambulatory Visit: Payer: Self-pay | Admitting: Endocrinology

## 2013-12-05 DIAGNOSIS — Z1231 Encounter for screening mammogram for malignant neoplasm of breast: Secondary | ICD-10-CM | POA: Diagnosis not present

## 2013-12-29 ENCOUNTER — Encounter: Payer: Self-pay | Admitting: Endocrinology

## 2014-01-16 ENCOUNTER — Encounter: Payer: Self-pay | Admitting: Endocrinology

## 2014-01-16 ENCOUNTER — Ambulatory Visit (INDEPENDENT_AMBULATORY_CARE_PROVIDER_SITE_OTHER): Payer: Medicare Other | Admitting: Endocrinology

## 2014-01-16 VITALS — BP 130/88 | HR 62 | Temp 97.5°F | Ht 61.0 in | Wt 128.0 lb

## 2014-01-16 DIAGNOSIS — D649 Anemia, unspecified: Secondary | ICD-10-CM | POA: Diagnosis not present

## 2014-01-16 DIAGNOSIS — N183 Chronic kidney disease, stage 3 unspecified: Secondary | ICD-10-CM | POA: Diagnosis not present

## 2014-01-16 DIAGNOSIS — I10 Essential (primary) hypertension: Secondary | ICD-10-CM | POA: Diagnosis not present

## 2014-01-16 DIAGNOSIS — I6350 Cerebral infarction due to unspecified occlusion or stenosis of unspecified cerebral artery: Secondary | ICD-10-CM

## 2014-01-16 DIAGNOSIS — I6529 Occlusion and stenosis of unspecified carotid artery: Secondary | ICD-10-CM

## 2014-01-16 DIAGNOSIS — E039 Hypothyroidism, unspecified: Secondary | ICD-10-CM | POA: Diagnosis not present

## 2014-01-16 DIAGNOSIS — Z23 Encounter for immunization: Secondary | ICD-10-CM | POA: Diagnosis not present

## 2014-01-16 DIAGNOSIS — Z0000 Encounter for general adult medical examination without abnormal findings: Secondary | ICD-10-CM

## 2014-01-16 DIAGNOSIS — E7800 Pure hypercholesterolemia, unspecified: Secondary | ICD-10-CM

## 2014-01-16 DIAGNOSIS — Z8679 Personal history of other diseases of the circulatory system: Secondary | ICD-10-CM | POA: Diagnosis not present

## 2014-01-16 DIAGNOSIS — Z79899 Other long term (current) drug therapy: Secondary | ICD-10-CM

## 2014-01-16 DIAGNOSIS — N2581 Secondary hyperparathyroidism of renal origin: Secondary | ICD-10-CM

## 2014-01-16 DIAGNOSIS — N289 Disorder of kidney and ureter, unspecified: Secondary | ICD-10-CM | POA: Diagnosis not present

## 2014-01-16 LAB — URINALYSIS, ROUTINE W REFLEX MICROSCOPIC
BILIRUBIN URINE: NEGATIVE
Hgb urine dipstick: NEGATIVE
KETONES UR: NEGATIVE
Leukocytes, UA: NEGATIVE
Nitrite: NEGATIVE
RBC / HPF: NONE SEEN (ref 0–?)
SPECIFIC GRAVITY, URINE: 1.01 (ref 1.000–1.030)
TOTAL PROTEIN, URINE-UPE24: NEGATIVE
Urine Glucose: NEGATIVE
Urobilinogen, UA: 0.2 (ref 0.0–1.0)
pH: 5.5 (ref 5.0–8.0)

## 2014-01-16 LAB — CBC WITH DIFFERENTIAL/PLATELET
BASOS PCT: 0.7 % (ref 0.0–3.0)
Basophils Absolute: 0 10*3/uL (ref 0.0–0.1)
EOS PCT: 4.8 % (ref 0.0–5.0)
Eosinophils Absolute: 0.3 10*3/uL (ref 0.0–0.7)
HCT: 33.3 % — ABNORMAL LOW (ref 36.0–46.0)
Hemoglobin: 10.9 g/dL — ABNORMAL LOW (ref 12.0–15.0)
LYMPHS PCT: 23.5 % (ref 12.0–46.0)
Lymphs Abs: 1.5 10*3/uL (ref 0.7–4.0)
MCHC: 32.7 g/dL (ref 30.0–36.0)
MCV: 91 fl (ref 78.0–100.0)
Monocytes Absolute: 0.6 10*3/uL (ref 0.1–1.0)
Monocytes Relative: 9.7 % (ref 3.0–12.0)
NEUTROS PCT: 61.3 % (ref 43.0–77.0)
Neutro Abs: 3.9 10*3/uL (ref 1.4–7.7)
Platelets: 241 10*3/uL (ref 150.0–400.0)
RBC: 3.66 Mil/uL — AB (ref 3.87–5.11)
RDW: 14 % (ref 11.5–15.5)
WBC: 6.3 10*3/uL (ref 4.0–10.5)

## 2014-01-16 LAB — IBC PANEL
IRON: 96 ug/dL (ref 42–145)
SATURATION RATIOS: 25 % (ref 20.0–50.0)
TRANSFERRIN: 274 mg/dL (ref 212.0–360.0)

## 2014-01-16 LAB — HEPATIC FUNCTION PANEL
ALK PHOS: 59 U/L (ref 39–117)
ALT: 16 U/L (ref 0–35)
AST: 27 U/L (ref 0–37)
Albumin: 3.6 g/dL (ref 3.5–5.2)
BILIRUBIN TOTAL: 0.3 mg/dL (ref 0.2–1.2)
Bilirubin, Direct: 0 mg/dL (ref 0.0–0.3)
Total Protein: 7 g/dL (ref 6.0–8.3)

## 2014-01-16 LAB — BASIC METABOLIC PANEL
BUN: 33 mg/dL — ABNORMAL HIGH (ref 6–23)
CALCIUM: 9.3 mg/dL (ref 8.4–10.5)
CO2: 23 meq/L (ref 19–32)
Chloride: 108 mEq/L (ref 96–112)
Creatinine, Ser: 2.7 mg/dL — ABNORMAL HIGH (ref 0.4–1.2)
GFR: 18.16 mL/min — AB (ref >60.00)
Glucose, Bld: 81 mg/dL (ref 70–99)
Potassium: 5 mEq/L (ref 3.5–5.1)
SODIUM: 140 meq/L (ref 135–145)

## 2014-01-16 LAB — T4, FREE: Free T4: 1.09 ng/dL (ref 0.60–1.60)

## 2014-01-16 LAB — LIPID PANEL
CHOL/HDL RATIO: 3
CHOLESTEROL: 194 mg/dL (ref 0–200)
HDL: 73.4 mg/dL (ref >39.00)
LDL CALC: 86 mg/dL (ref 0–99)
NonHDL: 120.6
TRIGLYCERIDES: 173 mg/dL — AB (ref 0.0–149.0)
VLDL: 34.6 mg/dL (ref 0.0–40.0)

## 2014-01-16 LAB — TSH: TSH: 0.64 u[IU]/mL (ref 0.35–4.50)

## 2014-01-16 NOTE — Patient Instructions (Addendum)
blood tests are being requested for you today.  We'll contact you with results. Please come back soon for a "medicare wellness" visit.

## 2014-01-16 NOTE — Progress Notes (Signed)
Subjective:    Patient ID: Katrina Yang, female    DOB: 1940/06/27, 73 y.o.   MRN: [id number]  HPI The state of at least three ongoing medical problems is addressed today, with interval history of each noted here: Hypothyroidism: since synthroid was reduced, pt states she feels better in general. Anemia: senies BRBPR. Secondary hyperparathyroidism: she denies cramps. Past Medical History  Diagnosis Date  . Hypertension   . Palpitations   . Pure hypercholesterolemia   . CVA (cerebral vascular accident)     hx of  . Secondary hyperparathyroidism (of renal origin)   . Pulmonary nodule   . GERD (gastroesophageal reflux disease)   . Diverticulosis of colon (without mention of hemorrhage)   . Colonic polyp     hx of  . Degenerative joint disease   . Personal history of unspecified urinary disorder   . Osteoporosis, unspecified   . Unspecified hypothyroidism   . History of hysterectomy   . Occipital neuralgia of left side 08/30/2013    Past Surgical History  Procedure Laterality Date  . Back surgery      diskectomy L-5. Multiple back surgeries  . Abdominal hysterectomy      History   Social History  . Marital Status: Married    Spouse Name: [name]    Number of Children: 1  . Years of Education: 2   Occupational History  . Metallurgist    Social History Main Topics  . Smoking status: Never Smoker   . Smokeless tobacco: Never Used  . Alcohol Use: No  . Drug Use: No  . Sexual Activity: Not on file   Other Topics Concern  . Not on file   Social History Narrative   Pt lives at home with her spouse.   She consumes 2-3 glasses of caffeine.    Current Outpatient Prescriptions on File Prior to Visit  Medication Sig Dispense Refill  . aspirin 81 MG tablet Take 1 tablet (81 mg total) by mouth daily.      . calcitRIOL (ROCALTROL) 0.25 MCG capsule TAKE 1 CAPSULE DAILY  90 capsule  0  . clopidogrel (PLAVIX) 75 MG tablet TAKE 1 TABLET DAILY  90 tablet  3  . gabapentin  (NEURONTIN) 100 MG capsule Take 100 mg by mouth daily.      Marland Kitchen levothyroxine (SYNTHROID, LEVOTHROID) 75 MCG tablet Take 1 tablet (75 mcg total) by mouth daily before breakfast.  90 tablet  3  . lisinopril (PRINIVIL,ZESTRIL) 2.5 MG tablet . Take one tablet (total dose 2.5 mg) by mouth at bedtime.      . metoprolol succinate (TOPROL-XL) 50 MG 24 hr tablet Take 1/2 tab twice a day      . rosuvastatin (CRESTOR) 10 MG tablet 5 mg. Take 1/2 tab daily       No current facility-administered medications on file prior to visit.    Allergies  Allergen Reactions  . Codeine   . Iodine   . Iohexol   . Lovastatin   . Metronidazole     REACTION: Reaction not known  . Sulfamethoxazole-Trimethoprim   . Sulfonamide Derivatives   . Latex Rash    rash    Family History  Problem Relation Age of Onset  . Kidney cancer Father   . Heart attack Father   . Heart disease Father   . Colon cancer Mother   . Ovarian cancer Mother   . Heart disease Mother   . Heart failure Mother   . Stomach cancer  Neg Hx     BP 130/88  Pulse 62  Temp(Src) 97.5 F (36.4 C) (Oral)  Ht 5\' 1"  (1.549 m)  Wt 128 lb (58.06 kg)  BMI 24.20 kg/m2  SpO2 92%    Review of Systems Denies hematuria and dysuria.    Objective:   Physical Exam VS: see vs page GEN: no distress HEAD: head: no deformity eyes: no periorbital swelling, no proptosis external nose and ears are normal mouth: no lesion seen NECK: supple, thyroid is not enlarged CHEST WALL: no deformity LUNGS:  Clear to auscultation. CV: reg rate and rhythm, no murmur ABD: abdomen is soft, nontender.  no hepatosplenomegaly.  not distended.  no hernia MUSCULOSKELETAL: muscle bulk and strength are grossly normal.  no obvious joint swelling.  gait is normal and steady EXTEMITIES: no deformity.  no ulcer on the feet.  feet are of normal color and temp.  no edema PULSES: dorsalis pedis intact bilat.  no carotid bruit NEURO:  cn 2-12 grossly intact.   readily moves  all 4's.  sensation is intact to touch on the feet SKIN:  Normal texture and temperature.  No rash or suspicious lesion is visible.   NODES:  None palpable at the neck PSYCH: alert, well-oriented.  Does not appear anxious nor depressed.   i reviewed electrocardiogram.    Lab Results  Component Value Date   TSH 0.64 01/16/2014   Lab Results  Component Value Date   WBC 6.3 01/16/2014   HGB 10.9* 01/16/2014   HCT 33.3* 01/16/2014   MCV 91.0 01/16/2014   PLT 241.0 01/16/2014   Lab Results  Component Value Date   PTH 51 01/16/2014   CALCIUM 9.2 01/16/2014   CALCIUM 9.3 01/16/2014       Assessment & Plan:  Hyperparathyroidism, well-controlled Hypothyroidism, well-replaced. Renal failure, moderate exacerbation. 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    Patient is advised the following: Patient Instructions  blood tests are being requested for you today.  We'll contact you with results. Please come back soon for a "medicare wellness" visit.     ref nephrol

## 2014-01-17 LAB — PTH, INTACT AND CALCIUM
Calcium: 9.2 mg/dL (ref 8.4–10.5)
PTH: 51 pg/mL (ref 14–64)

## 2014-01-19 ENCOUNTER — Encounter: Payer: Self-pay | Admitting: Endocrinology

## 2014-01-20 ENCOUNTER — Other Ambulatory Visit: Payer: Self-pay | Admitting: Endocrinology

## 2014-01-20 DIAGNOSIS — Z8679 Personal history of other diseases of the circulatory system: Secondary | ICD-10-CM

## 2014-01-23 DIAGNOSIS — T8062XA Other serum reaction due to vaccination, initial encounter: Secondary | ICD-10-CM | POA: Diagnosis not present

## 2014-02-05 ENCOUNTER — Other Ambulatory Visit: Payer: Self-pay | Admitting: Endocrinology

## 2014-02-05 ENCOUNTER — Encounter: Payer: Self-pay | Admitting: Endocrinology

## 2014-02-06 NOTE — Telephone Encounter (Signed)
[facility] called stating that office visit with Dr. Saunders Revel could not be made. Dr. Saunders Revel only see's stroke patients. Please advise, Thanks!

## 2014-02-15 ENCOUNTER — Telehealth: Payer: Self-pay

## 2014-02-15 ENCOUNTER — Telehealth: Payer: Self-pay | Admitting: Endocrinology

## 2014-02-15 NOTE — Telephone Encounter (Signed)
Requested call back from [facility] to discuss.

## 2014-02-15 NOTE — Telephone Encounter (Signed)
Pt needs [place] to resend referral to [facility] please.  She has an appt with Dr. Jacki Cones in December please send my chart message once complete

## 2014-02-17 NOTE — Telephone Encounter (Signed)
Called [facility] and advised that notes could be sent to Dr. Darnelle Bos office. Notes will be sent.

## 2014-02-18 ENCOUNTER — Other Ambulatory Visit: Payer: Self-pay | Admitting: Cardiology

## 2014-02-18 NOTE — Telephone Encounter (Signed)
Rx was sent to pharmacy electronically. 

## 2014-02-28 DIAGNOSIS — Z7982 Long term (current) use of aspirin: Secondary | ICD-10-CM | POA: Diagnosis not present

## 2014-02-28 DIAGNOSIS — Z8673 Personal history of transient ischemic attack (TIA), and cerebral infarction without residual deficits: Secondary | ICD-10-CM | POA: Diagnosis not present

## 2014-02-28 DIAGNOSIS — Z888 Allergy status to other drugs, medicaments and biological substances status: Secondary | ICD-10-CM | POA: Diagnosis not present

## 2014-02-28 DIAGNOSIS — Q6100 Congenital renal cyst, unspecified: Secondary | ICD-10-CM | POA: Diagnosis not present

## 2014-02-28 DIAGNOSIS — I1310 Hypertensive heart and chronic kidney disease without heart failure, with stage 1 through stage 4 chronic kidney disease, or unspecified chronic kidney disease: Secondary | ICD-10-CM | POA: Diagnosis not present

## 2014-02-28 DIAGNOSIS — Z87440 Personal history of urinary (tract) infections: Secondary | ICD-10-CM | POA: Diagnosis not present

## 2014-02-28 DIAGNOSIS — Z882 Allergy status to sulfonamides status: Secondary | ICD-10-CM | POA: Diagnosis not present

## 2014-02-28 DIAGNOSIS — N2581 Secondary hyperparathyroidism of renal origin: Secondary | ICD-10-CM | POA: Diagnosis not present

## 2014-02-28 DIAGNOSIS — Z885 Allergy status to narcotic agent status: Secondary | ICD-10-CM | POA: Diagnosis not present

## 2014-02-28 DIAGNOSIS — Z91041 Radiographic dye allergy status: Secondary | ICD-10-CM | POA: Diagnosis not present

## 2014-02-28 DIAGNOSIS — I129 Hypertensive chronic kidney disease with stage 1 through stage 4 chronic kidney disease, or unspecified chronic kidney disease: Secondary | ICD-10-CM | POA: Diagnosis not present

## 2014-02-28 DIAGNOSIS — E559 Vitamin D deficiency, unspecified: Secondary | ICD-10-CM | POA: Diagnosis not present

## 2014-02-28 DIAGNOSIS — G5790 Unspecified mononeuropathy of unspecified lower limb: Secondary | ICD-10-CM | POA: Diagnosis not present

## 2014-02-28 DIAGNOSIS — N183 Chronic kidney disease, stage 3 (moderate): Secondary | ICD-10-CM | POA: Diagnosis not present

## 2014-03-13 ENCOUNTER — Ambulatory Visit: Payer: Medicare Other | Admitting: Neurology

## 2014-03-13 ENCOUNTER — Other Ambulatory Visit: Payer: Self-pay

## 2014-03-13 MED ORDER — ROSUVASTATIN CALCIUM 10 MG PO TABS: 5.0000 mg | ORAL_TABLET | Freq: Every day | ORAL | Status: DC

## 2014-03-17 DIAGNOSIS — N281 Cyst of kidney, acquired: Secondary | ICD-10-CM | POA: Diagnosis not present

## 2014-03-20 ENCOUNTER — Other Ambulatory Visit: Payer: Self-pay | Admitting: Internal Medicine

## 2014-03-27 ENCOUNTER — Other Ambulatory Visit: Payer: Self-pay

## 2014-03-27 MED ORDER — LISINOPRIL 2.5 MG PO TABS: ORAL_TABLET | ORAL | Status: DC

## 2014-04-18 DIAGNOSIS — M5410 Radiculopathy, site unspecified: Secondary | ICD-10-CM | POA: Diagnosis not present

## 2014-05-08 DIAGNOSIS — Z5181 Encounter for therapeutic drug level monitoring: Secondary | ICD-10-CM | POA: Diagnosis not present

## 2014-05-08 DIAGNOSIS — Z79899 Other long term (current) drug therapy: Secondary | ICD-10-CM | POA: Diagnosis not present

## 2014-05-08 DIAGNOSIS — M47812 Spondylosis without myelopathy or radiculopathy, cervical region: Secondary | ICD-10-CM | POA: Diagnosis not present

## 2014-05-08 DIAGNOSIS — M961 Postlaminectomy syndrome, not elsewhere classified: Secondary | ICD-10-CM | POA: Diagnosis not present

## 2014-05-12 DIAGNOSIS — M961 Postlaminectomy syndrome, not elsewhere classified: Secondary | ICD-10-CM | POA: Insufficient documentation

## 2014-05-12 DIAGNOSIS — G549 Nerve root and plexus disorder, unspecified: Secondary | ICD-10-CM | POA: Insufficient documentation

## 2014-06-13 DIAGNOSIS — D638 Anemia in other chronic diseases classified elsewhere: Secondary | ICD-10-CM | POA: Diagnosis not present

## 2014-06-13 DIAGNOSIS — N281 Cyst of kidney, acquired: Secondary | ICD-10-CM | POA: Diagnosis not present

## 2014-06-13 DIAGNOSIS — Z7982 Long term (current) use of aspirin: Secondary | ICD-10-CM | POA: Diagnosis not present

## 2014-06-13 DIAGNOSIS — Z87440 Personal history of urinary (tract) infections: Secondary | ICD-10-CM | POA: Diagnosis not present

## 2014-06-13 DIAGNOSIS — Z91041 Radiographic dye allergy status: Secondary | ICD-10-CM | POA: Diagnosis not present

## 2014-06-13 DIAGNOSIS — Z888 Allergy status to other drugs, medicaments and biological substances status: Secondary | ICD-10-CM | POA: Diagnosis not present

## 2014-06-13 DIAGNOSIS — E559 Vitamin D deficiency, unspecified: Secondary | ICD-10-CM | POA: Diagnosis not present

## 2014-06-13 DIAGNOSIS — N183 Chronic kidney disease, stage 3 (moderate): Secondary | ICD-10-CM | POA: Diagnosis not present

## 2014-06-13 DIAGNOSIS — Q6100 Congenital renal cyst, unspecified: Secondary | ICD-10-CM | POA: Diagnosis not present

## 2014-06-13 DIAGNOSIS — N2581 Secondary hyperparathyroidism of renal origin: Secondary | ICD-10-CM | POA: Diagnosis not present

## 2014-06-13 DIAGNOSIS — Z8673 Personal history of transient ischemic attack (TIA), and cerebral infarction without residual deficits: Secondary | ICD-10-CM | POA: Diagnosis not present

## 2014-06-13 DIAGNOSIS — Z882 Allergy status to sulfonamides status: Secondary | ICD-10-CM | POA: Diagnosis not present

## 2014-06-13 DIAGNOSIS — I129 Hypertensive chronic kidney disease with stage 1 through stage 4 chronic kidney disease, or unspecified chronic kidney disease: Secondary | ICD-10-CM | POA: Diagnosis not present

## 2014-06-13 DIAGNOSIS — Z885 Allergy status to narcotic agent status: Secondary | ICD-10-CM | POA: Diagnosis not present

## 2014-06-13 DIAGNOSIS — I2510 Atherosclerotic heart disease of native coronary artery without angina pectoris: Secondary | ICD-10-CM | POA: Diagnosis not present

## 2014-06-22 ENCOUNTER — Ambulatory Visit: Payer: Medicare Other | Admitting: Neurology

## 2014-06-26 ENCOUNTER — Encounter: Payer: Self-pay | Admitting: Endocrinology

## 2014-06-26 DIAGNOSIS — M5417 Radiculopathy, lumbosacral region: Secondary | ICD-10-CM | POA: Diagnosis not present

## 2014-06-26 DIAGNOSIS — M25562 Pain in left knee: Secondary | ICD-10-CM | POA: Diagnosis not present

## 2014-06-26 DIAGNOSIS — G8929 Other chronic pain: Secondary | ICD-10-CM | POA: Diagnosis not present

## 2014-06-26 DIAGNOSIS — Z5181 Encounter for therapeutic drug level monitoring: Secondary | ICD-10-CM | POA: Diagnosis not present

## 2014-06-27 ENCOUNTER — Encounter: Payer: Self-pay | Admitting: Endocrinology

## 2014-06-28 ENCOUNTER — Ambulatory Visit (INDEPENDENT_AMBULATORY_CARE_PROVIDER_SITE_OTHER): Payer: Medicare Other | Admitting: Endocrinology

## 2014-06-28 ENCOUNTER — Ambulatory Visit (HOSPITAL_COMMUNITY): Payer: Medicare Other | Attending: Cardiovascular Disease | Admitting: Cardiology

## 2014-06-28 ENCOUNTER — Encounter: Payer: Self-pay | Admitting: Endocrinology

## 2014-06-28 VITALS — BP 138/80 | HR 73 | Temp 98.0°F | Ht 61.0 in | Wt 126.0 lb

## 2014-06-28 DIAGNOSIS — M25562 Pain in left knee: Secondary | ICD-10-CM | POA: Diagnosis not present

## 2014-06-28 DIAGNOSIS — M810 Age-related osteoporosis without current pathological fracture: Secondary | ICD-10-CM

## 2014-06-28 DIAGNOSIS — M25569 Pain in unspecified knee: Secondary | ICD-10-CM | POA: Insufficient documentation

## 2014-06-28 DIAGNOSIS — Z0000 Encounter for general adult medical examination without abnormal findings: Secondary | ICD-10-CM | POA: Diagnosis not present

## 2014-06-28 DIAGNOSIS — M79662 Pain in left lower leg: Secondary | ICD-10-CM | POA: Insufficient documentation

## 2014-06-28 DIAGNOSIS — N289 Disorder of kidney and ureter, unspecified: Secondary | ICD-10-CM

## 2014-06-28 MED ORDER — DICLOFENAC SODIUM 1 % TD GEL: 4.0000 g | Freq: Four times a day (QID) | TRANSDERMAL | Status: DC

## 2014-06-28 NOTE — Progress Notes (Signed)
Subjective:    Patient ID: Katrina Yang, female    DOB: March 18, 1941, 74 y.o.   MRN: [id number]  HPI Pt states 12 years of moderate pain at the left knee, and assoc numbness.  sxs radiate to the left foot.  Pt says this started with her CVA in 2003.  Pt says the numbness is steady over the years, but the pain is worsening.   Past Medical History  Diagnosis Date  . Hypertension   . Palpitations   . Pure hypercholesterolemia   . CVA (cerebral vascular accident)     hx of  . Secondary hyperparathyroidism (of renal origin)   . Pulmonary nodule   . GERD (gastroesophageal reflux disease)   . Diverticulosis of colon (without mention of hemorrhage)   . Colonic polyp     hx of  . Degenerative joint disease   . Personal history of unspecified urinary disorder   . Osteoporosis, unspecified   . Unspecified hypothyroidism   . History of hysterectomy   . Occipital neuralgia of left side 08/30/2013    Past Surgical History  Procedure Laterality Date  . Back surgery      diskectomy L-5. Multiple back surgeries  . Abdominal hysterectomy      History   Social History  . Marital Status: Married    Spouse Name: [name]  . Number of Children: 1  . Years of Education: 2   Occupational History  . Metallurgist    Social History Main Topics  . Smoking status: Never Smoker   . Smokeless tobacco: Never Used  . Alcohol Use: No  . Drug Use: No  . Sexual Activity: Not on file   Other Topics Concern  . Not on file   Social History Narrative   Pt lives at home with her spouse.   She consumes 2-3 glasses of caffeine.    Current Outpatient Prescriptions on File Prior to Visit  Medication Sig Dispense Refill  . aspirin 81 MG tablet Take 1 tablet (81 mg total) by mouth daily.    . calcitRIOL (ROCALTROL) 0.25 MCG capsule TAKE 1 CAPSULE DAILY (APPOINTMENT NEEDED FOR FURTHER REFILLS) 90 capsule 2  . clopidogrel (PLAVIX) 75 MG tablet TAKE 1 TABLET DAILY 90 tablet 3  . gabapentin (NEURONTIN)  100 MG capsule Take 100 mg by mouth daily.    Marland Kitchen levothyroxine (SYNTHROID, LEVOTHROID) 75 MCG tablet Take 1 tablet (75 mcg total) by mouth daily before breakfast. 90 tablet 3  . lisinopril (PRINIVIL,ZESTRIL) 2.5 MG tablet TAKE 2 TABLETS (5 MG) EVERY MORNING AND TAKE 1 TABLET (2.5 MG) AT BEDTIME (Patient taking differently: TAKE 1 TABLET (2.5 MG) AT BEDTIME) 270 tablet 1  . metoprolol succinate (TOPROL-XL) 50 MG 24 hr tablet Take 0.5 tablet (25 mg total) by mouth twice daily. 90 tablet 1  . rosuvastatin (CRESTOR) 10 MG tablet Take 0.5 tablets (5 mg total) by mouth daily. Take 1/2 tab daily 90 tablet 3   No current facility-administered medications on file prior to visit.    Allergies  Allergen Reactions  . Iodine   . Iohexol   . Lovastatin   . Metronidazole     REACTION: Reaction not known  . Sulfamethoxazole-Trimethoprim   . Sulfonamide Derivatives   . Codeine Rash  . Latex Rash    rash    Family History  Problem Relation Age of Onset  . Kidney cancer Father   . Heart attack Father   . Heart disease Father   . Colon cancer  Mother   . Ovarian cancer Mother   . Heart disease Mother   . Heart failure Mother   . Stomach cancer Neg Hx     BP 138/80 mmHg  Pulse 73  Temp(Src) 98 F (36.7 C) (Oral)  Ht 5\' 1"  (1.549 m)  Wt 126 lb (57.153 kg)  BMI 23.82 kg/m2  SpO2 98%    Review of Systems Denies swelling and chest pain    Objective:   Physical Exam VITAL SIGNS:  See vs page GENERAL: no distress LLE: no edema Left knee: no /warmth/erythema/swelling.  Slight tenderness at the posterior aspect of the left knee.   Gait: slightly favors LLE.         Assessment & Plan:  Knee pain: new to me.  i have sent a prescription to your pharmacy, for an anti-pain gel.  Let's check an ultrasound today. Also, we'll check a nerve-ending test.  you will receive a phone call, about a day and time for an appointment. Finally, please see an orthopedic specialist.  you will receive a  phone call, about a day and time for an appointment.   Subjective:   Patient here for Medicare annual wellness visit and management of other chronic and acute problems.     Risk factors: advanced age.    Roster of Physicians Providing Medical Care to Patient:  See "snapshot"   Activities of Daily Living: In your present state of health, do you have any difficulty performing the following activities? (lives with husband):  Preparing food and eating?: No  Bathing yourself: No  Getting dressed: No  Using the toilet:No  Moving around from place to place: No  In the past year have you fallen or had a near fall?: No    Home Safety: Has smoke detector and wears seat belts. Firearms are safely stored. No excess sun exposure.  Diet and Exercise  Current exercise habits: pt says limited by health probs Dietary issues discussed: pt reports a healthy diet   Depression Screen  Q1: Over the past two weeks, have you felt down, depressed or hopeless? no  Q2: Over the past two weeks, have you felt little interest or pleasure in doing things? no   The following portions of the patient's history were reviewed and updated as appropriate: allergies, current medications, past family history, past medical history, past social history, past surgical history and problem list.   Review of Systems  Denies hearing loss, and visual loss Objective:   Vision:  Advertising account executive in [place] (does not recall name) Hearing: grossly normal Body mass index:  See vs page Msk: pt easily and quickly performs "get-up-and-go" from a sitting position Cognitive Impairment Assessment: cognition, memory and judgment appear normal.  remembers 3/3 at 5 minutes.  excellent recall.  can easily read and write a sentence.  alert and oriented x 3.     Assessment:   Medicare wellness utd on preventive parameters    Plan:   During the course of the visit the patient was educated and counseled about appropriate screening  and preventive services including:        Fall prevention   Screening mammography  Bone densitometry screening  Diabetes screening  Nutrition counseling   Vaccines / LABS Zostavax / Pneumococcal Vaccine  today  Patient Instructions (the written plan) was given to the patient.

## 2014-06-28 NOTE — Patient Instructions (Addendum)
Let's check an ultrasound today. Also, we'll check a nerve-ending test.  you will receive a phone call, about a day and time for an appointment. Finally, please see an orthopedic specialist.  you will receive a phone call, about a day and time for an appointment.   please consider these measures for your health:  minimize alcohol.  do not use tobacco products.  have a colonoscopy at least every 10 years from age 72.  Women should have an annual mammogram from age 55.  keep firearms safely stored.  always use seat belts.  have working smoke alarms in your home.  see an eye doctor and dentist regularly.  never drive under the influence of alcohol or drugs (including prescription drugs).  those with fair skin should take precautions against the sun. it is critically important to prevent falling down (keep floor areas well-lit, dry, and free of loose objects.  If you have a cane, walker, or wheelchair, you should use it, even for short trips around the house.  Also, try not to rush). Please send [place] a copy of your power of attorney and living will.   i have sent a prescription to your pharmacy, for an anti-pain gel.

## 2014-06-28 NOTE — Progress Notes (Signed)
Lt LE venous duplex performed

## 2014-07-03 DIAGNOSIS — N183 Chronic kidney disease, stage 3 (moderate): Secondary | ICD-10-CM | POA: Diagnosis not present

## 2014-07-05 DIAGNOSIS — Z0279 Encounter for issue of other medical certificate: Secondary | ICD-10-CM

## 2014-07-12 ENCOUNTER — Other Ambulatory Visit: Payer: Self-pay | Admitting: Endocrinology

## 2014-07-14 ENCOUNTER — Ambulatory Visit (INDEPENDENT_AMBULATORY_CARE_PROVIDER_SITE_OTHER): Payer: Medicare Other | Admitting: Neurology

## 2014-07-14 ENCOUNTER — Ambulatory Visit (INDEPENDENT_AMBULATORY_CARE_PROVIDER_SITE_OTHER): Payer: Self-pay | Admitting: Neurology

## 2014-07-14 ENCOUNTER — Encounter: Payer: Self-pay | Admitting: Neurology

## 2014-07-14 DIAGNOSIS — M79605 Pain in left leg: Secondary | ICD-10-CM | POA: Diagnosis not present

## 2014-07-14 DIAGNOSIS — G8929 Other chronic pain: Secondary | ICD-10-CM

## 2014-07-14 DIAGNOSIS — M25562 Pain in left knee: Secondary | ICD-10-CM

## 2014-07-14 DIAGNOSIS — M79602 Pain in left arm: Secondary | ICD-10-CM

## 2014-07-14 NOTE — Procedures (Signed)
     HISTORY:  Katrina Yang is a 74 year old patient with a history of left medial knee discomfort over the last 12 years. The patient has had worsening pain over the last 3 or 4 months. She has a history of lumbosacral spine surgery in the past. She is being evaluated for a possible neuropathy or a lumbosacral radiculopathy.  NERVE CONDUCTION STUDIES:  Nerve conduction studies were performed on both lower extremities. The distal motor latencies and motor amplitudes for the peroneal and posterior tibial nerves were within normal limits, with exception that there was a low motor amplitude for the left peroneal nerve. The nerve conduction velocities for these nerves were also normal. The H reflex latencies were normal. The sensory latencies for the peroneal nerves were within normal limits.   EMG STUDIES:  EMG study was performed on the left lower extremity:  The tibialis anterior muscle reveals 2 to 5K motor units with minimally reduced recruitment. No fibrillations or positive waves were seen. The peroneus tertius muscle reveals 2 to 5K motor units with minimally reduced recruitment. No fibrillations or positive waves were seen. The medial gastrocnemius muscle reveals 1 to 3K motor units with full recruitment. No fibrillations or positive waves were seen. The vastus lateralis muscle reveals 2 to 4K motor units with full recruitment. No fibrillations or positive waves were seen. The iliopsoas muscle reveals 2 to 4K motor units with full recruitment. No fibrillations or positive waves were seen. The biceps femoris muscle (long head) reveals 2 to 4K motor units with full recruitment. No fibrillations or positive waves were seen. The lumbosacral paraspinal muscles were tested at 3 levels, and revealed no abnormalities of insertional activity at all 3 levels tested. There was good relaxation.   IMPRESSION:  Nerve conduction studies done on both lower extremity shows evidence of a low motor  amplitude for the peroneal nerve, otherwise the study was normal. EMG evaluation of the left lower extremity shows mild chronic findings of denervation consistent with an L5 radiculopathy. No other significant abnormalities were seen.  Jill Alexanders MD 07/14/2014 11:05 AM  [facility] [address] [place],  [address]  Phone [phone] Fax [phone]

## 2014-07-14 NOTE — Progress Notes (Signed)
Please refer to EMG and nerve conduction study procedure note. 

## 2014-07-25 ENCOUNTER — Other Ambulatory Visit: Payer: Self-pay | Admitting: Cardiology

## 2014-07-27 DIAGNOSIS — M1712 Unilateral primary osteoarthritis, left knee: Secondary | ICD-10-CM | POA: Diagnosis not present

## 2014-07-27 DIAGNOSIS — M25562 Pain in left knee: Secondary | ICD-10-CM | POA: Diagnosis not present

## 2014-08-09 ENCOUNTER — Telehealth: Payer: Self-pay | Admitting: Cardiology

## 2014-08-09 DIAGNOSIS — I10 Essential (primary) hypertension: Secondary | ICD-10-CM

## 2014-08-09 DIAGNOSIS — D649 Anemia, unspecified: Secondary | ICD-10-CM

## 2014-08-09 NOTE — Telephone Encounter (Signed)
Per Richardson Dopp, PAC (Flex) the pt is advised to take additional Metoprolol 50 mg today and to come to the office tomorrow for a BP check and a BMET. The pt verbalized understanding and agrees with plan. BP check with BMET is scheduled for tomorrow at 9:15.

## 2014-08-09 NOTE — Telephone Encounter (Signed)
Ok bring BP readings in with her.

## 2014-08-09 NOTE — Telephone Encounter (Signed)
New Message    Pt c/o BP issue: STAT if pt c/o blurred vision, one-sided weakness or slurred speech  1. What are your last 5 BP readings? 191/115, 179/117, 156/103, 183/117, 177/112 2. Are you having any other symptoms (ex. Dizziness, headache, blurred vision, passed out)?headache since last night and do not feel real good   3. What is your BP issue? Too high

## 2014-08-09 NOTE — Telephone Encounter (Signed)
Will forward this message to Dr Aundra Dubin and nurse for further review of this pts BP being good, and decision to cancel her lab and nurse visit scheduled for tomorrow 08/10/14.  Pt will keep her appt for 08/23/14 with Dr Aundra Dubin.

## 2014-08-09 NOTE — Telephone Encounter (Signed)
Follow up      Want the nurse to know her husband brought her a bp machine and her bp is good.  She is not coming tomorrow for the lab and nurse visit.  She will see Dr Aundra Dubin for her next appt in a few weeks

## 2014-08-09 NOTE — Telephone Encounter (Signed)
New Message    Patient is calling to speak to nurse in regards to her BP. She needs to know if she needs the appt that is scheduled for 08/10/14 with the Nurse about her BP. She states she purchased a new BP machine and her BP is 156/74 &140/80 with new machine. Please give patient a call

## 2014-08-09 NOTE — Telephone Encounter (Signed)
**Note De-Identified Essense Bousquet Obfuscation** The pt states that she has had a headache since yesterday and that she is "shaky". She is concerned about her elevated BP and wants to know what to do to get it down. She reports the following readings: 3/23  BP at 3:15pm= 129/80                At 9:35pm= 138/86 3/24  BP at 10pm= 132/87 3/28  BP at 11pm= 149/96 4/1    BP at 11pm= 122/77 4/4    BP at 12pm= 112/68 4/5    BP at 9:30pm= 142/91                At 1:30am= 199/131                At 1:45am= 201/115 4/6     BP at 10:25am= 191/115  Will discuss with DOD or Flex.

## 2014-08-09 NOTE — Telephone Encounter (Signed)
**Note De-Identified Keevin Panebianco Obfuscation** The pt states that she will bring her BP readings with her to her f/u with Dr Aundra Dubin.

## 2014-08-10 ENCOUNTER — Other Ambulatory Visit: Payer: BC Managed Care – PPO

## 2014-08-13 ENCOUNTER — Other Ambulatory Visit: Payer: Self-pay | Admitting: Neurology

## 2014-08-21 DIAGNOSIS — J189 Pneumonia, unspecified organism: Secondary | ICD-10-CM | POA: Diagnosis not present

## 2014-08-22 ENCOUNTER — Ambulatory Visit: Payer: Medicare Other | Admitting: Cardiology

## 2014-08-23 ENCOUNTER — Ambulatory Visit: Payer: Medicare Other | Admitting: Cardiology

## 2014-08-31 DIAGNOSIS — M25562 Pain in left knee: Secondary | ICD-10-CM | POA: Diagnosis not present

## 2014-08-31 DIAGNOSIS — M1712 Unilateral primary osteoarthritis, left knee: Secondary | ICD-10-CM | POA: Diagnosis not present

## 2014-10-10 ENCOUNTER — Other Ambulatory Visit: Payer: Self-pay | Admitting: Endocrinology

## 2014-10-10 DIAGNOSIS — H2513 Age-related nuclear cataract, bilateral: Secondary | ICD-10-CM | POA: Diagnosis not present

## 2014-10-11 ENCOUNTER — Other Ambulatory Visit: Payer: Self-pay | Admitting: Endocrinology

## 2014-10-12 DIAGNOSIS — N189 Chronic kidney disease, unspecified: Secondary | ICD-10-CM | POA: Diagnosis not present

## 2014-10-12 DIAGNOSIS — G609 Hereditary and idiopathic neuropathy, unspecified: Secondary | ICD-10-CM | POA: Diagnosis not present

## 2014-10-12 DIAGNOSIS — Q6100 Congenital renal cyst, unspecified: Secondary | ICD-10-CM | POA: Diagnosis not present

## 2014-10-12 DIAGNOSIS — N184 Chronic kidney disease, stage 4 (severe): Secondary | ICD-10-CM | POA: Diagnosis not present

## 2014-10-12 DIAGNOSIS — D631 Anemia in chronic kidney disease: Secondary | ICD-10-CM | POA: Diagnosis not present

## 2014-10-12 DIAGNOSIS — N390 Urinary tract infection, site not specified: Secondary | ICD-10-CM | POA: Diagnosis not present

## 2014-10-12 DIAGNOSIS — N2581 Secondary hyperparathyroidism of renal origin: Secondary | ICD-10-CM | POA: Diagnosis not present

## 2014-10-12 DIAGNOSIS — E559 Vitamin D deficiency, unspecified: Secondary | ICD-10-CM | POA: Diagnosis not present

## 2014-10-23 ENCOUNTER — Other Ambulatory Visit: Payer: Self-pay | Admitting: Cardiology

## 2014-10-23 MED ORDER — METOPROLOL SUCCINATE ER 50 MG PO TB24: ORAL_TABLET | ORAL | Status: DC

## 2014-10-24 ENCOUNTER — Telehealth: Payer: Self-pay | Admitting: Endocrinology

## 2014-10-24 MED ORDER — METOPROLOL SUCCINATE ER 50 MG PO TB24: ORAL_TABLET | ORAL | Status: DC

## 2014-10-24 NOTE — Addendum Note (Signed)
Addended by: Merlene Laughter on: 10/24/2014 05:48 PM   Modules accepted: Orders

## 2014-10-24 NOTE — Telephone Encounter (Signed)
Left a voicemail advising Dr. Loanne Drilling is currently out of the office and will not be returning until 6/27. Pt's appointment request will be sent to Dr. Loanne Drilling once he returns.

## 2014-10-24 NOTE — Telephone Encounter (Signed)
Patient called stating that she needs to see Dr. Loanne Drilling  Per patient her thyroid has doubled in size   Please advise patient as she would like an appointment ASAP    Thank you

## 2014-10-31 ENCOUNTER — Encounter: Payer: Self-pay | Admitting: Endocrinology

## 2014-10-31 NOTE — Telephone Encounter (Signed)
Requested call back from the pt to call and schedule her appointment.

## 2014-11-01 ENCOUNTER — Telehealth: Payer: Self-pay | Admitting: Endocrinology

## 2014-11-01 NOTE — Telephone Encounter (Signed)
Pt called staitng

## 2014-11-01 NOTE — Telephone Encounter (Signed)
Pt states you left a message for her to call back to make an appt this week. I do not see any open slots please advise and call the pt

## 2014-11-01 NOTE — Telephone Encounter (Signed)
Waiting on Md to respond to previous note.

## 2014-11-01 NOTE — Telephone Encounter (Signed)
Pt requesting a call back regarding her thyroid questions

## 2014-11-01 NOTE — Telephone Encounter (Signed)
I contacted the pt and advised of appointment time. Pt agreed to this appointment time 11/07/2014 at 2 pm.

## 2014-11-01 NOTE — Telephone Encounter (Signed)
I contacted the pt and she stated she is going out of town and will be back on 7/4. She wanted to know if we could add her to the schedule on 7/5 and right now we are completely booked. Please advise, Thanks!

## 2014-11-01 NOTE — Telephone Encounter (Signed)
2 PM 7/5

## 2014-11-01 NOTE — Telephone Encounter (Signed)
Ov 11 am tomorrow

## 2014-11-01 NOTE — Telephone Encounter (Signed)
Pt has been having weak spells and fast heart rate and would like to be seen this week. Please advise if this pt can be added to the schedule.

## 2014-11-07 ENCOUNTER — Ambulatory Visit (INDEPENDENT_AMBULATORY_CARE_PROVIDER_SITE_OTHER): Payer: Medicare Other | Admitting: Endocrinology

## 2014-11-07 ENCOUNTER — Encounter: Payer: Self-pay | Admitting: Endocrinology

## 2014-11-07 VITALS — BP 103/78 | HR 58 | Temp 98.7°F | Ht 61.0 in | Wt 126.0 lb

## 2014-11-07 DIAGNOSIS — I10 Essential (primary) hypertension: Secondary | ICD-10-CM | POA: Diagnosis not present

## 2014-11-07 MED ORDER — CALCITRIOL 0.25 MCG PO CAPS: 0.2500 ug | ORAL_CAPSULE | Freq: Every day | ORAL | Status: DC

## 2014-11-07 MED ORDER — LISINOPRIL 2.5 MG PO TABS: 2.5000 mg | ORAL_TABLET | Freq: Every day | ORAL | Status: DC

## 2014-11-07 NOTE — Patient Instructions (Signed)
Please take lisinopril just at bedtime. Please come back for a regular physical appointment in 3 months.

## 2014-11-07 NOTE — Progress Notes (Signed)
Subjective:    Patient ID: Katrina Yang, female    DOB: 11/20/1940, 74 y.o.   MRN: [id number]  HPI Pt says she gets generalized weakness whenever she takes zestril bid.  She says sxs don't happen on qd.  She has been taking just qd for the past 3 weeks.   Past Medical History  Diagnosis Date  . Hypertension   . Palpitations   . Pure hypercholesterolemia   . CVA (cerebral vascular accident)     hx of  . Secondary hyperparathyroidism (of renal origin)   . Pulmonary nodule   . GERD (gastroesophageal reflux disease)   . Diverticulosis of colon (without mention of hemorrhage)   . Colonic polyp     hx of  . Degenerative joint disease   . Personal history of unspecified urinary disorder   . Osteoporosis, unspecified   . Unspecified hypothyroidism   . History of hysterectomy   . Occipital neuralgia of left side 08/30/2013    Past Surgical History  Procedure Laterality Date  . Back surgery      diskectomy L-5. Multiple back surgeries  . Abdominal hysterectomy      History   Social History  . Marital Status: Married    Spouse Name: [name]  . Number of Children: 1  . Years of Education: 2   Occupational History  . Metallurgist    Social History Main Topics  . Smoking status: Never Smoker   . Smokeless tobacco: Never Used  . Alcohol Use: No  . Drug Use: No  . Sexual Activity: Not on file   Other Topics Concern  . Not on file   Social History Narrative   Pt lives at home with her spouse.   She consumes 2-3 glasses of caffeine.    Current Outpatient Prescriptions on File Prior to Visit  Medication Sig Dispense Refill  . aspirin 81 MG tablet Take 1 tablet (81 mg total) by mouth daily.    . clopidogrel (PLAVIX) 75 MG tablet TAKE 1 TABLET DAILY 90 tablet 1  . diclofenac sodium (VOLTAREN) 1 % GEL Apply 4 g topically 4 (four) times daily. 200 g 11  . gabapentin (NEURONTIN) 100 MG capsule Take 100 mg by mouth daily.    Marland Kitchen levothyroxine (SYNTHROID, LEVOTHROID) 75 MCG  tablet TAKE 1 TABLET DAILY BEFORE BREAKFAST 90 tablet 2  . metoprolol succinate (TOPROL-XL) 50 MG 24 hr tablet TAKE ONE-HALF TABLET (25 MG) TWICE A DAY 90 tablet 0  . rosuvastatin (CRESTOR) 10 MG tablet Take 0.5 tablets (5 mg total) by mouth daily. Take 1/2 tab daily 90 tablet 3   No current facility-administered medications on file prior to visit.    Allergies  Allergen Reactions  . Iodine   . Iohexol   . Lovastatin   . Metronidazole     REACTION: Reaction not known  . Sulfamethoxazole-Trimethoprim   . Sulfonamide Derivatives   . Codeine Rash  . Latex Rash    rash    Family History  Problem Relation Age of Onset  . Kidney cancer Father   . Heart attack Father   . Heart disease Father   . Colon cancer Mother   . Ovarian cancer Mother   . Heart disease Mother   . Heart failure Mother   . Stomach cancer Neg Hx     BP 103/78 mmHg  Pulse 58  Temp(Src) 98.7 F (37.1 C) (Oral)  Ht 5\' 1"  (1.549 m)  Wt 126 lb (57.153 kg)  BMI  23.82 kg/m2  SpO2 96%   Review of Systems Denies LOC    Objective:   Physical Exam VITAL SIGNS:  See vs page GENERAL: no distress Gait: normal and steady.       Assessment & Plan:  HTN: overcontrolled.  Patient is advised the following: Patient Instructions  Please take lisinopril just at bedtime. Please come back for a regular physical appointment in 3 months.

## 2014-11-24 ENCOUNTER — Encounter: Payer: Self-pay | Admitting: Gastroenterology

## 2014-12-06 ENCOUNTER — Ambulatory Visit (INDEPENDENT_AMBULATORY_CARE_PROVIDER_SITE_OTHER): Payer: Medicare Other | Admitting: Cardiology

## 2014-12-06 ENCOUNTER — Encounter: Payer: Self-pay | Admitting: Cardiology

## 2014-12-06 VITALS — BP 126/64 | HR 62 | Ht 61.0 in | Wt 124.0 lb

## 2014-12-06 DIAGNOSIS — N183 Chronic kidney disease, stage 3 unspecified: Secondary | ICD-10-CM

## 2014-12-06 DIAGNOSIS — Z8679 Personal history of other diseases of the circulatory system: Secondary | ICD-10-CM | POA: Diagnosis not present

## 2014-12-06 DIAGNOSIS — I6350 Cerebral infarction due to unspecified occlusion or stenosis of unspecified cerebral artery: Secondary | ICD-10-CM

## 2014-12-06 DIAGNOSIS — I10 Essential (primary) hypertension: Secondary | ICD-10-CM | POA: Diagnosis not present

## 2014-12-06 DIAGNOSIS — I639 Cerebral infarction, unspecified: Secondary | ICD-10-CM

## 2014-12-06 NOTE — Patient Instructions (Signed)
Medication Instructions:  No changes today  Labwork: None   Testing/Procedures: None   Follow-Up: Your physician wants you to follow-up in: 1 year with Dr Aundra Dubin. (August 2017). You will receive a reminder letter in the mail two months in advance. If you don't receive a letter, please call our office to schedule the follow-up appointment.    Thank you for choosing [facility]!!

## 2014-12-07 NOTE — Progress Notes (Signed)
Patient ID: Katrina Yang, female   DOB: 30-Jan-1941, 74 y.o.   MRN: [id number] PCP: Dr. Loanne Drilling  74 yo with history of HTN and CVA presents for cardiology followup.  She has a history of labile blood pressure.  This seems to be somewhat improved recently with decrease in lisinopril dose.  She continues to report "weak spells."  She will feel tired/fatigued with these spells but denies lightheadedness.  No tachypalpitations.  Event monitor in 8/14 showed no significant abnormalities and echo showed normal EF.  Less "weak spells" since decreasing lisinopril.  SBP still goes down to the 90s in the mornings and gets up into the 170s occasionally in the evenings.   She is active and does a fair amount of walking and yardwork.  No chest pain or exertional dyspnea.  She has tolerated Crestor 5 mg daily without myalgias. No stroke-like symptoms.   ECG: NSR, normal  Labs (9/13): creatinine 1.9, LDL 155, HDL 82 Labs (9/14): K 4.6, creatinine 2.1 Labs (10/14): LDL 107, HDL 91 Labs (1/15): LDL 93, HDL 84 Labs (4/15): K 4.8, creatinine 2.13, HCT 34.3 Labs (9/15): LDL 86, HDL 73, TSH normal Labs (6/16): creatinine 2.34  PMH: 1. Pulmonary nodule: Stable with serial followup.  2. HTN: Renal artery dopplers in 2012 with no evidence for significant renal artery stenosis.  3. Palpitations: Event monitor (8/14) with no significant abnormalities.  4. Hyperlipidemia 5. CVA: On ASA and Plavix.  6. Secondary hyperparathyroidism 7. GERD 8. Hypothyroidism 9. TAH 10. Back surgery 11. Left carotid bruit: Minor plaque on carotid dopplers in 4/13. Carotid dopplers (4/15) with mild BICA stenosis.  12. CKD: Sees nephrologist at [facility].  13. Peripheral neuropathy.  14. Echo (8/14) with EF 0000000, PA systolic pressure 32 mmHg.  Echo (4/15) with EF 55-60%.    SH: Married, 1 child, nonsmoker, retired Pharmacist, hospital.   FH: CAD both parents.  ROS: All systems reviewed and negative except as per HPI.   Current  Outpatient Prescriptions  Medication Sig Dispense Refill  . aspirin 81 MG tablet Take 1 tablet (81 mg total) by mouth daily.    . calcitRIOL (ROCALTROL) 0.25 MCG capsule Take 1 capsule (0.25 mcg total) by mouth daily. 90 capsule 3  . clopidogrel (PLAVIX) 75 MG tablet TAKE 1 TABLET DAILY 90 tablet 1  . diclofenac sodium (VOLTAREN) 1 % GEL Apply 4 g topically 4 (four) times daily. 200 g 11  . gabapentin (NEURONTIN) 100 MG capsule Take 100 mg by mouth daily.    Marland Kitchen levothyroxine (SYNTHROID, LEVOTHROID) 75 MCG tablet TAKE 1 TABLET DAILY BEFORE BREAKFAST 90 tablet 2  . lisinopril (PRINIVIL,ZESTRIL) 2.5 MG tablet Take 1 tablet (2.5 mg total) by mouth at bedtime. 180 tablet 1  . metoprolol succinate (TOPROL-XL) 50 MG 24 hr tablet TAKE ONE-HALF TABLET (25 MG) TWICE A DAY 90 tablet 0  . rosuvastatin (CRESTOR) 10 MG tablet Take 0.5 tablets (5 mg total) by mouth daily. Take 1/2 tab daily 90 tablet 3   No current facility-administered medications for this visit.    BP 126/64 mmHg  Pulse 62  Ht 5\' 1"  (1.549 m)  Wt 124 lb (56.246 kg)  BMI 23.44 kg/m2 General: NAD Neck: No JVD, no thyromegaly or thyroid nodule.  Lungs: Clear to auscultation bilaterally with normal respiratory effort. CV: Nondisplaced PMI.  Heart regular S1/S2, no S3/S4, no murmur.  No peripheral edema.  Soft left carotid bruit.  Normal pedal pulses.  Abdomen: Soft, nontender, no hepatosplenomegaly, no distention.  Skin: Intact  without lesions or rashes.  Neurologic: Alert and oriented x 3.  Psych: Normal affect. Extremities: No clubbing or cyanosis.   Assessment/Plan: 1. "Weak spells": Uncertain etiology.  These have been going on for a long time.  No significant arrhythmia on cardiac monitor in 8/14 (though she only wore it for a few days). Echo was unremarkable in 4/15.  She had a lot of trouble with the event monitor in 8/14 and would not wear one again.  These spells have become less frequent with lower lisinopril dose.  I would  not decrease Toprol XL or lisinopril any further as SBP can get up to the 170s (quite labile).  2. Hyperlipidemia: She has a history of CVA.  She did not tolerate Mevacor years ago.  She has done well so far on Crestor 5 mg daily.  LDL 86 in 9/15. 3. H/o CVA: She is on ASA 81 and Plavix per neurology.  She is on a statin.  4. Carotid stenosis: Bruit on exam.  Mild plaque on 4/15 carotid dopplers.    5. CKD: Sees a nephrologist at [facility].  6. HTN: Labile.   [name] 12/07/2014

## 2014-12-14 DIAGNOSIS — M1712 Unilateral primary osteoarthritis, left knee: Secondary | ICD-10-CM | POA: Diagnosis not present

## 2014-12-18 ENCOUNTER — Ambulatory Visit (INDEPENDENT_AMBULATORY_CARE_PROVIDER_SITE_OTHER): Payer: Medicare Other | Admitting: Neurology

## 2014-12-18 ENCOUNTER — Encounter: Payer: Self-pay | Admitting: Neurology

## 2014-12-18 VITALS — BP 146/74 | HR 60 | Ht 61.0 in | Wt 124.5 lb

## 2014-12-18 DIAGNOSIS — M25562 Pain in left knee: Secondary | ICD-10-CM

## 2014-12-18 DIAGNOSIS — M545 Low back pain, unspecified: Secondary | ICD-10-CM

## 2014-12-18 DIAGNOSIS — I639 Cerebral infarction, unspecified: Secondary | ICD-10-CM | POA: Diagnosis not present

## 2014-12-18 MED ORDER — NORTRIPTYLINE HCL 10 MG PO CAPS: ORAL_CAPSULE | ORAL | Status: DC

## 2014-12-18 MED ORDER — GABAPENTIN 100 MG PO CAPS: 100.0000 mg | ORAL_CAPSULE | Freq: Every day | ORAL | Status: DC

## 2014-12-18 NOTE — Progress Notes (Signed)
Reason for visit: Left leg pain  Katrina Yang is an 74 y.o. female  History of present illness:  Katrina Yang is a 74 year old right-handed white female with a history of chronic renal insufficiency. The patient has a history of stroke with left-sided symptoms, the patient indicates that she developed some left-sided discomfort following the stroke. She has pain in the left flank and into the left leg. She has degenerative arthritis of the left knee, she recently had an injection of the knee which does help the pain some. She indicates that she feels better in the morning, worse as the day goes on. She has had previous lumbosacral spine surgery, she has a residual chronic L5 radiculopathy, recent nerve conduction studies of the legs and EMG of the left leg has been done. No active radiculopathy is noted. The patient is on low-dose gabapentin taking 100 mg in the evening, she indicates that her nephrologist will not allow her to take a higher dose. She returns to this office for an evaluation. She denies any stiffening or weakness of the leg, no falls. She does have some slight gait instability. She reports some numbness of the left foot. The pain in the medial aspect of the left knee is described as a sharp jabbing quality.  Past Medical History  Diagnosis Date  . Hypertension   . Palpitations   . Pure hypercholesterolemia   . CVA (cerebral vascular accident)     hx of  . Secondary hyperparathyroidism (of renal origin)   . Pulmonary nodule   . GERD (gastroesophageal reflux disease)   . Diverticulosis of colon (without mention of hemorrhage)   . Colonic polyp     hx of  . Degenerative joint disease   . Personal history of unspecified urinary disorder   . Osteoporosis, unspecified   . Unspecified hypothyroidism   . History of hysterectomy   . Occipital neuralgia of left side 08/30/2013    Past Surgical History  Procedure Laterality Date  . Back surgery      diskectomy L-5.  Multiple back surgeries  . Abdominal hysterectomy      Family History  Problem Relation Age of Onset  . Kidney cancer Father   . Heart attack Father   . Heart disease Father   . Colon cancer Mother   . Ovarian cancer Mother   . Heart disease Mother   . Heart failure Mother   . Stomach cancer Neg Hx     Social history:  reports that she has never smoked. She has never used smokeless tobacco. She reports that she does not drink alcohol or use illicit drugs.    Allergies  Allergen Reactions  . Iodine   . Iohexol   . Lovastatin   . Metronidazole     REACTION: Reaction not known  . Sulfamethoxazole-Trimethoprim   . Sulfonamide Derivatives   . Codeine Rash  . Latex Rash    rash    Medications:  Prior to Admission medications   Medication Sig Start Date End Date Taking? Authorizing Provider  aspirin 81 MG tablet Take 1 tablet (81 mg total) by mouth daily. 12/28/12  Yes Larey Dresser, MD  calcitRIOL (ROCALTROL) 0.25 MCG capsule Take 1 capsule (0.25 mcg total) by mouth daily. 11/07/14  Yes Renato Shin, MD  clopidogrel (PLAVIX) 75 MG tablet TAKE 1 TABLET DAILY 08/13/14  Yes Kathrynn Ducking, MD  gabapentin (NEURONTIN) 100 MG capsule Take 100 mg by mouth daily. 06/16/13  Yes Vikram R  Penumalli, MD  levothyroxine (SYNTHROID, LEVOTHROID) 75 MCG tablet TAKE 1 TABLET DAILY BEFORE BREAKFAST 10/10/14  Yes Renato Shin, MD  lisinopril (PRINIVIL,ZESTRIL) 2.5 MG tablet Take 1 tablet (2.5 mg total) by mouth at bedtime. 11/07/14  Yes Renato Shin, MD  metoprolol succinate (TOPROL-XL) 50 MG 24 hr tablet TAKE ONE-HALF TABLET (25 MG) TWICE A DAY 10/24/14  Yes Larey Dresser, MD  rosuvastatin (CRESTOR) 10 MG tablet Take 0.5 tablets (5 mg total) by mouth daily. Take 1/2 tab daily 03/13/14  Yes Larey Dresser, MD    ROS:  Out of a complete 14 system review of symptoms, the patient complains only of the following symptoms, and all other reviewed systems are negative.  Restless legs Back pain, walking  difficulty, neck pain Bruising easily Memory loss, dizziness, headache, numbness, weakness  Blood pressure 146/74, pulse 60, height 5\' 1"  (1.549 m), weight 124 lb 8 oz (56.473 kg).  Physical Exam  General: The patient is alert and cooperative at the time of the examination.  Skin: No significant peripheral edema is noted.   Neurologic Exam  Mental status: The patient is alert and oriented x 3 at the time of the examination. The patient has apparent normal recent and remote memory, with an apparently normal attention span and concentration ability.   Cranial nerves: Facial symmetry is present. Speech is normal, no aphasia or dysarthria is noted. Extraocular movements are full. Visual fields are full.  Motor: The patient has good strength in all 4 extremities.  Sensory examination: Soft touch sensation is symmetric on the face, arms, and legs.  Coordination: The patient has good finger-nose-finger and heel-to-shin bilaterally.  Gait and station: The patient has a normal gait. Tandem gait is very slightly unsteady. Romberg is negative. No drift is seen. The patient is able to walk on heels and the toes bilaterally.  Reflexes: Deep tendon reflexes are symmetric, but are slightly depressed.   Assessment/Plan:  1. Left leg discomfort  The patient is having ongoing discomfort involving the left leg. The patient will be placed on nortriptyline taking 10 mg at night for 2 weeks, then go to 20 mg at night. A prescription was called in for this medication as well as for gabapentin. She will follow-up in 4-6 months, sooner if needed.  Jill Alexanders MD 12/18/2014 7:55 PM  [facility] [address] [place], [place] [address]  Phone [phone] Fax [phone]

## 2014-12-18 NOTE — Patient Instructions (Addendum)
   We will start Pamelor at night for the left leg pain. Call if you have any concerns.  Pamelor (nortriptyline) is an antidepressant medication that has many uses that may include headache, whiplash injuries, or for peripheral neuropathy pain. Side effects may include drowsiness, dry mouth, blurred vision, or constipation. As with any antidepressant medication, worsening depression may occur. If you had any significant side effects, please call our office. The full effects of this medication may take 7-10 days after starting the drug, or going up on the dose.  Chronic Back Pain  When back pain lasts longer than 3 months, it is called chronic back pain.People with chronic back pain often go through certain periods that are more intense (flare-ups).  CAUSES Chronic back pain can be caused by wear and tear (degeneration) on different structures in your back. These structures include:  The bones of your spine (vertebrae) and the joints surrounding your spinal cord and nerve roots (facets).  The strong, fibrous tissues that connect your vertebrae (ligaments). Degeneration of these structures may result in pressure on your nerves. This can lead to constant pain. HOME CARE INSTRUCTIONS  Avoid bending, heavy lifting, prolonged sitting, and activities which make the problem worse.  Take brief periods of rest throughout the day to reduce your pain. Lying down or standing usually is better than sitting while you are resting.  Take over-the-counter or prescription medicines only as directed by your caregiver. SEEK IMMEDIATE MEDICAL CARE IF:   You have weakness or numbness in one of your legs or feet.  You have trouble controlling your bladder or bowels.  You have nausea, vomiting, abdominal pain, shortness of breath, or fainting. Document Released: 05/29/2004 Document Revised: 07/14/2011 Document Reviewed: 04/05/2011 [facility] Patient Information 2015 [place], [place]. This information is not  intended to replace advice given to you by your health care provider. Make sure you discuss any questions you have with your health care provider.

## 2014-12-26 DIAGNOSIS — Z1231 Encounter for screening mammogram for malignant neoplasm of breast: Secondary | ICD-10-CM | POA: Diagnosis not present

## 2015-01-11 ENCOUNTER — Encounter: Payer: Self-pay | Admitting: Endocrinology

## 2015-01-19 ENCOUNTER — Other Ambulatory Visit: Payer: Self-pay | Admitting: Cardiology

## 2015-01-24 DIAGNOSIS — M25511 Pain in right shoulder: Secondary | ICD-10-CM | POA: Diagnosis not present

## 2015-01-29 ENCOUNTER — Telehealth: Payer: Self-pay | Admitting: Neurology

## 2015-01-29 NOTE — Telephone Encounter (Signed)
Patient is calling with hand and finger numbness which she feels might be related to her stroke. She states her husband Aliyha Reever will be coming in at 10:00 Thursday 9/29 and would like to know if she can be worked in that day. Please call.

## 2015-01-29 NOTE — Telephone Encounter (Signed)
I called the patient. She woke up last Thursday with numbness in her right hand. She stated her hand was swollen. It hurt to move her arm in certain directions. The pain is in her shoulder. She felt like it was an orthopedic problem and saw her orthopedic doctor last week. He stated it was not orthopedic and felt like it was neurologic. The numbness is a little better. She believes it is coming from the disc problems she has in her neck. She requested an appointment to see Dr. Jannifer Franklin Thursday when her husband will be here. Appointment scheduled for 02/01/15 at [place]. I also discussed the plan with Dr. Leonie Man [facility]) who agreed with the plan.

## 2015-02-01 ENCOUNTER — Encounter: Payer: Self-pay | Admitting: Neurology

## 2015-02-01 ENCOUNTER — Ambulatory Visit (INDEPENDENT_AMBULATORY_CARE_PROVIDER_SITE_OTHER): Payer: Medicare Other | Admitting: Neurology

## 2015-02-01 VITALS — BP 170/84 | HR 65 | Ht 61.0 in | Wt 123.5 lb

## 2015-02-01 DIAGNOSIS — I639 Cerebral infarction, unspecified: Secondary | ICD-10-CM

## 2015-02-01 DIAGNOSIS — M47812 Spondylosis without myelopathy or radiculopathy, cervical region: Secondary | ICD-10-CM | POA: Diagnosis not present

## 2015-02-01 HISTORY — DX: Spondylosis without myelopathy or radiculopathy, cervical region: M47.812

## 2015-02-01 MED ORDER — TRAMADOL HCL 50 MG PO TABS: 50.0000 mg | ORAL_TABLET | Freq: Four times a day (QID) | ORAL | Status: DC | PRN

## 2015-02-01 NOTE — Patient Instructions (Addendum)
   We will try ultram for pain, and get you set up for MRI of the neck. We may consider epidural injections from there.  Radicular Pain Radicular pain in either the arm or leg is usually from a bulging or herniated disk in the spine. A piece of the herniated disk may press against the nerves as the nerves exit the spine. This causes pain which is felt at the tips of the nerves down the arm or leg. Other causes of radicular pain may include:  Fractures.  Heart disease.  Cancer.  An abnormal and usually degenerative state of the nervous system or nerves (neuropathy). Diagnosis may require CT or MRI scanning to determine the primary cause.  Nerves that start at the neck (nerve roots) may cause radicular pain in the outer shoulder and arm. It can spread down to the thumb and fingers. The symptoms vary depending on which nerve root has been affected. In most cases radicular pain improves with conservative treatment. Neck problems may require physical therapy, a neck collar, or cervical traction. Treatment may take many weeks, and surgery may be considered if the symptoms do not improve.  Conservative treatment is also recommended for sciatica. Sciatica causes pain to radiate from the lower back or buttock area down the leg into the foot. Often there is a history of back problems. Most patients with sciatica are better after 2 to 4 weeks of rest and other supportive care. Short term bed rest can reduce the disk pressure considerably. Sitting, however, is not a good position since this increases the pressure on the disk. You should avoid bending, lifting, and all other activities which make the problem worse. Traction can be used in severe cases. Surgery is usually reserved for patients who do not improve within the first months of treatment. Only take over-the-counter or prescription medicines for pain, discomfort, or fever as directed by your caregiver. Narcotics and muscle relaxants may help by relieving  more severe pain and spasm and by providing mild sedation. Cold or massage can give significant relief. Spinal manipulation is not recommended. It can increase the degree of disc protrusion. Epidural steroid injections are often effective treatment for radicular pain. These injections deliver medicine to the spinal nerve in the space between the protective covering of the spinal cord and back bones (vertebrae). Your caregiver can give you more information about steroid injections. These injections are most effective when given within two weeks of the onset of pain.  You should see your caregiver for follow up care as recommended. A program for neck and back injury rehabilitation with stretching and strengthening exercises is an important part of management.  SEEK IMMEDIATE MEDICAL CARE IF:  You develop increased pain, weakness, or numbness in your arm or leg.  You develop difficulty with bladder or bowel control.  You develop abdominal pain. Document Released: 05/29/2004 Document Revised: 07/14/2011 Document Reviewed: 08/14/2008 [facility] Patient Information 2015 [place], [place]. This information is not intended to replace advice given to you by your health care Breda Bond. Make sure you discuss any questions you have with your health care Tayson Schnelle.

## 2015-02-01 NOTE — Progress Notes (Signed)
Reason for visit: Arm and shoulder pain  Katrina Yang is an 74 y.o. female  History of present illness:  Katrina Yang is a 74 year old right-handed white female who has noted sudden onset of right arm discomfort and right shoulder discomfort that began 3 weeks ago. The patient woke up with a sensation of swelling in her right hand, no true swelling was seen. She had numbness and tingling sensations, and she noted right shoulder and neck discomfort, with some pain going down the right arm to the elbow. The patient believes that there is some weakness of the right arm, the pain has persisted. She may have pain if she moves her arm in a certain way. She has noted that if she tilts her head to the right, she can induce pain into the shoulders well. She has developed a slight headache associated with the neck discomfort. She reports crepitus in the neck. She denies any difficulties with the left arm or the legs. She denies any change in balance or difficulty controlling the bowels or the bladder. She has been seen by an orthopedic surgeon, she underwent an x-ray the shoulder, and she had an injection in the shoulder that has not offered much benefit. She comes to this office for an evaluation. The patient has severe chronic renal disease, not on hemodialysis. She has been taking Tylenol for the pain without much benefit.  Past Medical History  Diagnosis Date  . Hypertension   . Palpitations   . Pure hypercholesterolemia   . CVA (cerebral vascular accident)     hx of  . Secondary hyperparathyroidism (of renal origin)   . Pulmonary nodule   . GERD (gastroesophageal reflux disease)   . Diverticulosis of colon (without mention of hemorrhage)   . Colonic polyp     hx of  . Degenerative joint disease   . Personal history of unspecified urinary disorder   . Osteoporosis, unspecified   . Unspecified hypothyroidism   . History of hysterectomy   . Occipital neuralgia of left side 08/30/2013  .  Cervical spondylosis without myelopathy 02/01/2015    Past Surgical History  Procedure Laterality Date  . Back surgery      diskectomy L-5. Multiple back surgeries  . Abdominal hysterectomy      Family History  Problem Relation Age of Onset  . Kidney cancer Father   . Heart attack Father   . Heart disease Father   . Colon cancer Mother   . Ovarian cancer Mother   . Heart disease Mother   . Heart failure Mother   . Stomach cancer Neg Hx     Social history:  reports that she has never smoked. She has never used smokeless tobacco. She reports that she does not drink alcohol or use illicit drugs.    Allergies  Allergen Reactions  . Iodine   . Iohexol   . Lovastatin   . Metronidazole     REACTION: Reaction not known  . Sulfamethoxazole-Trimethoprim   . Sulfonamide Derivatives   . Codeine Rash  . Latex Rash    rash    Medications:  Prior to Admission medications   Medication Sig Start Date End Date Taking? Authorizing Provider  aspirin 81 MG tablet Take 1 tablet (81 mg total) by mouth daily. 12/28/12  Yes Larey Dresser, MD  calcitRIOL (ROCALTROL) 0.25 MCG capsule Take 1 capsule (0.25 mcg total) by mouth daily. 11/07/14  Yes Renato Shin, MD  clopidogrel (PLAVIX) 75 MG tablet TAKE  1 TABLET DAILY 08/13/14  Yes Kathrynn Ducking, MD  gabapentin (NEURONTIN) 100 MG capsule Take 1 capsule (100 mg total) by mouth daily. 12/18/14  Yes Kathrynn Ducking, MD  levothyroxine (SYNTHROID, LEVOTHROID) 75 MCG tablet TAKE 1 TABLET DAILY BEFORE BREAKFAST 10/10/14  Yes Renato Shin, MD  lisinopril (PRINIVIL,ZESTRIL) 2.5 MG tablet Take 1 tablet (2.5 mg total) by mouth at bedtime. 11/07/14  Yes Renato Shin, MD  metoprolol succinate (TOPROL-XL) 50 MG 24 hr tablet TAKE ONE-HALF TABLET (25 MG) TWICE A DAY 10/24/14  Yes Larey Dresser, MD  metoprolol succinate (TOPROL-XL) 50 MG 24 hr tablet TAKE ONE-HALF (1/2) TABLET TWICE A DAY 01/19/15  Yes Larey Dresser, MD  rosuvastatin (CRESTOR) 10 MG tablet Take 0.5  tablets (5 mg total) by mouth daily. Take 1/2 tab daily 03/13/14  Yes Larey Dresser, MD  nortriptyline (PAMELOR) 10 MG capsule One at night for 2 weeks, then take 2 capsules at night Patient not taking: Reported on 02/01/2015 12/18/14   Kathrynn Ducking, MD  traMADol (ULTRAM) 50 MG tablet Take 1 tablet (50 mg total) by mouth every 6 (six) hours as needed. 02/01/15   Kathrynn Ducking, MD    ROS:  Out of a complete 14 system review of symptoms, the patient complains only of the following symptoms, and all other reviewed systems are negative.  Back pain, neck pain Memory loss, dizziness, headache, numbness, weakness  Blood pressure 170/84, pulse 65, height 5\' 1"  (1.549 m), weight 123 lb 8 oz (56.019 kg).  Physical Exam  General: The patient is alert and cooperative at the time of the examination.  Neuromuscular: Range of movement of the cervical spine lacks about 10-15 of full lateral rotation bilaterally.  Skin: No significant peripheral edema is noted.   Neurologic Exam  Mental status: The patient is alert and oriented x 3 at the time of the examination. The patient has apparent normal recent and remote memory, with an apparently normal attention span and concentration ability.   Cranial nerves: Facial symmetry is present. Speech is normal, no aphasia or dysarthria is noted. Extraocular movements are full. Visual fields are full.  Motor: The patient has good strength in all 4 extremities, with exception that there may be some slight weakness of the right biceps muscle.  Sensory examination: Soft touch sensation is symmetric on the face, arms, and legs.  Coordination: The patient has good finger-nose-finger and heel-to-shin bilaterally.  Gait and station: The patient has a normal gait. Tandem gait is minimally unsteady. Romberg is negative. No drift is seen.  Reflexes: Deep tendon reflexes are symmetric.   Assessment/Plan:  1. Right neck, arm discomfort  2. Degenerative  arthritis, left knee  3. History of stroke  The patient is developing new issues with pain in the right neck and shoulder, cervicogenic headache, and paresthesias down the right arm that seems to worsen when she tilts her head to the right. The patient may have a cervical radiculopathy. There may be some slight weakness of the right biceps muscle versus giveaway weakness. The patient will be set up for MRI evaluation of the cervical spine. I will not start a prednisone Dosepak secondary to the history of significant chronic renal insufficiency. The patient will be given Ultram to take if needed for pain. We could consider epidural steroid injections in the future, however if the MRI of the cervical spine demonstrates a source of radicular compression or irritation. I will contact the patient concerning the MRI results.  Jill Alexanders MD 02/01/2015 7:36 PM  [facility] [address] [place], [place] [address]  Phone [phone] Fax [phone]

## 2015-02-07 ENCOUNTER — Encounter: Payer: BC Managed Care – PPO | Admitting: Endocrinology

## 2015-02-08 DIAGNOSIS — N2581 Secondary hyperparathyroidism of renal origin: Secondary | ICD-10-CM | POA: Diagnosis not present

## 2015-02-08 DIAGNOSIS — N184 Chronic kidney disease, stage 4 (severe): Secondary | ICD-10-CM | POA: Diagnosis not present

## 2015-02-08 DIAGNOSIS — N189 Chronic kidney disease, unspecified: Secondary | ICD-10-CM | POA: Diagnosis not present

## 2015-02-08 DIAGNOSIS — I10 Essential (primary) hypertension: Secondary | ICD-10-CM | POA: Diagnosis not present

## 2015-02-08 DIAGNOSIS — D631 Anemia in chronic kidney disease: Secondary | ICD-10-CM | POA: Diagnosis not present

## 2015-02-08 DIAGNOSIS — N281 Cyst of kidney, acquired: Secondary | ICD-10-CM | POA: Diagnosis not present

## 2015-02-09 ENCOUNTER — Encounter: Payer: Self-pay | Admitting: Endocrinology

## 2015-02-09 ENCOUNTER — Ambulatory Visit (INDEPENDENT_AMBULATORY_CARE_PROVIDER_SITE_OTHER): Payer: Medicare Other | Admitting: Endocrinology

## 2015-02-09 VITALS — BP 122/70 | HR 62 | Temp 97.9°F | Ht 61.0 in | Wt 124.0 lb

## 2015-02-09 DIAGNOSIS — D649 Anemia, unspecified: Secondary | ICD-10-CM | POA: Diagnosis not present

## 2015-02-09 DIAGNOSIS — Z23 Encounter for immunization: Secondary | ICD-10-CM | POA: Diagnosis not present

## 2015-02-09 DIAGNOSIS — E039 Hypothyroidism, unspecified: Secondary | ICD-10-CM | POA: Diagnosis not present

## 2015-02-09 DIAGNOSIS — I10 Essential (primary) hypertension: Secondary | ICD-10-CM

## 2015-02-09 DIAGNOSIS — I6350 Cerebral infarction due to unspecified occlusion or stenosis of unspecified cerebral artery: Secondary | ICD-10-CM

## 2015-02-09 DIAGNOSIS — E7800 Pure hypercholesterolemia, unspecified: Secondary | ICD-10-CM

## 2015-02-09 MED ORDER — LINACLOTIDE 290 MCG PO CAPS: 290.0000 ug | ORAL_CAPSULE | Freq: Every day | ORAL | Status: DC

## 2015-02-09 NOTE — Patient Instructions (Addendum)
blood tests are requested for you today.  We'll let you know about the results. i have sent a prescription to your pharmacy, for these symptoms.   i'll see you soon.

## 2015-02-09 NOTE — Progress Notes (Signed)
Subjective:    Patient ID: Katrina Yang, female    DOB: Dec 09, 1940, 74 y.o.   MRN: [id number]  HPI Pt states 10 days of slight cramping throughout the abdomen, and assoc constipation.   Past Medical History  Diagnosis Date  . Hypertension   . Palpitations   . Pure hypercholesterolemia   . CVA (cerebral vascular accident) ([place])     hx of  . Secondary hyperparathyroidism (of renal origin)   . Pulmonary nodule   . GERD (gastroesophageal reflux disease)   . Diverticulosis of colon (without mention of hemorrhage)   . Colonic polyp     hx of  . Degenerative joint disease   . Personal history of unspecified urinary disorder   . Osteoporosis, unspecified   . Unspecified hypothyroidism   . History of hysterectomy   . Occipital neuralgia of left side 08/30/2013  . Cervical spondylosis without myelopathy 02/01/2015    Past Surgical History  Procedure Laterality Date  . Back surgery      diskectomy L-5. Multiple back surgeries  . Abdominal hysterectomy      Social History   Social History  . Marital Status: Married    Spouse Name: [name]  . Number of Children: 1  . Years of Education: 2   Occupational History  . Metallurgist    Social History Main Topics  . Smoking status: Never Smoker   . Smokeless tobacco: Never Used  . Alcohol Use: No  . Drug Use: No  . Sexual Activity: Not on file   Other Topics Concern  . Not on file   Social History Narrative   Pt lives at home with her spouse.   Patient drinks 1 glass of tea every other day.   Patient is right handed.    Current Outpatient Prescriptions on File Prior to Visit  Medication Sig Dispense Refill  . aspirin 81 MG tablet Take 1 tablet (81 mg total) by mouth daily.    . calcitRIOL (ROCALTROL) 0.25 MCG capsule Take 1 capsule (0.25 mcg total) by mouth daily. 90 capsule 3  . clopidogrel (PLAVIX) 75 MG tablet TAKE 1 TABLET DAILY 90 tablet 1  . gabapentin (NEURONTIN) 100 MG capsule Take 1 capsule (100 mg total) by  mouth daily. 90 capsule 1  . levothyroxine (SYNTHROID, LEVOTHROID) 75 MCG tablet TAKE 1 TABLET DAILY BEFORE BREAKFAST 90 tablet 2  . lisinopril (PRINIVIL,ZESTRIL) 2.5 MG tablet Take 1 tablet (2.5 mg total) by mouth at bedtime. 180 tablet 1  . metoprolol succinate (TOPROL-XL) 50 MG 24 hr tablet TAKE ONE-HALF TABLET (25 MG) TWICE A DAY 90 tablet 0  . metoprolol succinate (TOPROL-XL) 50 MG 24 hr tablet TAKE ONE-HALF (1/2) TABLET TWICE A DAY 90 tablet 3  . nortriptyline (PAMELOR) 10 MG capsule One at night for 2 weeks, then take 2 capsules at night 60 capsule 3  . rosuvastatin (CRESTOR) 10 MG tablet Take 0.5 tablets (5 mg total) by mouth daily. Take 1/2 tab daily 90 tablet 3  . traMADol (ULTRAM) 50 MG tablet Take 1 tablet (50 mg total) by mouth every 6 (six) hours as needed. 60 tablet 1   No current facility-administered medications on file prior to visit.    Allergies  Allergen Reactions  . Iodine   . Iohexol   . Lovastatin   . Metronidazole     REACTION: Reaction not known  . Sulfamethoxazole-Trimethoprim   . Sulfonamide Derivatives   . Codeine Rash  . Latex Rash    rash  Family History  Problem Relation Age of Onset  . Kidney cancer Father   . Heart attack Father   . Heart disease Father   . Colon cancer Mother   . Ovarian cancer Mother   . Heart disease Mother   . Heart failure Mother   . Stomach cancer Neg Hx     BP 122/70 mmHg  Pulse 62  Temp(Src) 97.9 F (36.6 C) (Oral)  Ht 5\' 1"  (1.549 m)  Wt 124 lb (56.246 kg)  BMI 23.44 kg/m2  SpO2 98%  Review of Systems She has recurrent hemorrhoids (last episode was many years ago).  No weight loss.     Objective:   Physical Exam VITAL SIGNS:  See vs page.  GENERAL: no distress.  ABDOMEN: abdomen is soft, nontender.  no hepatosplenomegaly.  not distended.  no hernia.      Assessment & Plan:  Constipation, new abd pain, mild, prob due to constipation Dyslipidemia: due for recheck Hypothyroidism: due for  recheck   Patient is advised the following: Patient Instructions  blood tests are requested for you today.  We'll let you know about the results. i have sent a prescription to your pharmacy, for these symptoms.   i'll see you soon.

## 2015-02-19 ENCOUNTER — Telehealth: Payer: Self-pay | Admitting: Neurology

## 2015-02-19 ENCOUNTER — Ambulatory Visit
Admission: RE | Admit: 2015-02-19 | Discharge: 2015-02-19 | Disposition: A | Discharge status: Home / Self Care | Payer: Medicare Other | Source: Ambulatory Visit | Attending: Neurology | Admitting: Neurology

## 2015-02-19 DIAGNOSIS — M47812 Spondylosis without myelopathy or radiculopathy, cervical region: Secondary | ICD-10-CM

## 2015-02-19 NOTE — Telephone Encounter (Signed)
I called the patient. The MRI of the cervical spine shows multilevel spondylosis, no definite nerve root impingement. The patient is still having a lot of discomfort, we may consider epidural steroid injection. The patient is to contact us if she agrees to this.   MRI cervical spine 02/19/2015:  IMPRESSION: This is an abnormal MRI of the cervical spine without contrast showing multilevel degenerative disc and spondylosis as detailed above. There does not appear to be any nerve root impingement at any of the cervical levels. The spinal cord is normal. Compared to an MRI dated 03/03/2013, there is no interval change

## 2015-02-20 NOTE — Addendum Note (Signed)
Addended by: Margette Fast on: 02/20/2015 07:23 PM   Modules accepted: Orders

## 2015-02-20 NOTE — Telephone Encounter (Signed)
Patient returned call. Dr. Jannifer Franklin called yesterday and left message about getting shots. Patient would like more information about this. Please call [phone].

## 2015-02-21 ENCOUNTER — Other Ambulatory Visit: Payer: Self-pay | Admitting: Neurology

## 2015-02-21 DIAGNOSIS — M47812 Spondylosis without myelopathy or radiculopathy, cervical region: Secondary | ICD-10-CM

## 2015-02-27 ENCOUNTER — Encounter: Payer: Self-pay | Admitting: Endocrinology

## 2015-02-27 ENCOUNTER — Ambulatory Visit (INDEPENDENT_AMBULATORY_CARE_PROVIDER_SITE_OTHER): Payer: Medicare Other | Admitting: Endocrinology

## 2015-02-27 ENCOUNTER — Telehealth: Payer: Self-pay | Admitting: Endocrinology

## 2015-02-27 VITALS — BP 142/84 | HR 69 | Temp 98.1°F | Wt 124.0 lb

## 2015-02-27 DIAGNOSIS — D649 Anemia, unspecified: Secondary | ICD-10-CM

## 2015-02-27 DIAGNOSIS — E7800 Pure hypercholesterolemia, unspecified: Secondary | ICD-10-CM | POA: Diagnosis not present

## 2015-02-27 DIAGNOSIS — I10 Essential (primary) hypertension: Secondary | ICD-10-CM

## 2015-02-27 DIAGNOSIS — Z0000 Encounter for general adult medical examination without abnormal findings: Secondary | ICD-10-CM | POA: Diagnosis not present

## 2015-02-27 DIAGNOSIS — M810 Age-related osteoporosis without current pathological fracture: Secondary | ICD-10-CM | POA: Diagnosis not present

## 2015-02-27 DIAGNOSIS — E039 Hypothyroidism, unspecified: Secondary | ICD-10-CM

## 2015-02-27 DIAGNOSIS — I6350 Cerebral infarction due to unspecified occlusion or stenosis of unspecified cerebral artery: Secondary | ICD-10-CM | POA: Diagnosis not present

## 2015-02-27 LAB — CBC WITH DIFFERENTIAL/PLATELET
BASOS PCT: 0.7 % (ref 0.0–3.0)
Basophils Absolute: 0 10*3/uL (ref 0.0–0.1)
EOS ABS: 0.2 10*3/uL (ref 0.0–0.7)
EOS PCT: 2.6 % (ref 0.0–5.0)
HEMATOCRIT: 33.1 % — AB (ref 36.0–46.0)
HEMOGLOBIN: 11.1 g/dL — AB (ref 12.0–15.0)
Lymphocytes Relative: 21 % (ref 12.0–46.0)
Lymphs Abs: 1.3 10*3/uL (ref 0.7–4.0)
MCHC: 33.5 g/dL (ref 30.0–36.0)
MCV: 90 fl (ref 78.0–100.0)
MONO ABS: 0.6 10*3/uL (ref 0.1–1.0)
Monocytes Relative: 9.1 % (ref 3.0–12.0)
Neutro Abs: 4.1 10*3/uL (ref 1.4–7.7)
Neutrophils Relative %: 66.6 % (ref 43.0–77.0)
Platelets: 288 10*3/uL (ref 150.0–400.0)
RBC: 3.68 Mil/uL — AB (ref 3.87–5.11)
RDW: 13.7 % (ref 11.5–15.5)
WBC: 6.2 10*3/uL (ref 4.0–10.5)

## 2015-02-27 LAB — IBC PANEL
Iron: 65 ug/dL (ref 42–145)
SATURATION RATIOS: 16.2 % — AB (ref 20.0–50.0)
Transferrin: 286 mg/dL (ref 212.0–360.0)

## 2015-02-27 LAB — LIPID PANEL
CHOL/HDL RATIO: 2
Cholesterol: 212 mg/dL — ABNORMAL HIGH (ref 0–200)
HDL: 90.6 mg/dL (ref >39.00)
LDL Cholesterol: 106 mg/dL — ABNORMAL HIGH (ref 0–99)
NONHDL: 120.92
Triglycerides: 75 mg/dL (ref 0.0–149.0)
VLDL: 15 mg/dL (ref 0.0–40.0)

## 2015-02-27 LAB — HEPATIC FUNCTION PANEL
ALK PHOS: 61 U/L (ref 39–117)
ALT: 14 U/L (ref 0–35)
AST: 22 U/L (ref 0–37)
Albumin: 3.8 g/dL (ref 3.5–5.2)
BILIRUBIN TOTAL: 0.4 mg/dL (ref 0.2–1.2)
Bilirubin, Direct: 0.1 mg/dL (ref 0.0–0.3)
Total Protein: 6.8 g/dL (ref 6.0–8.3)

## 2015-02-27 LAB — TSH: TSH: 1.21 u[IU]/mL (ref 0.35–4.50)

## 2015-02-27 MED ORDER — METOPROLOL TARTRATE 25 MG PO TABS: 25.0000 mg | ORAL_TABLET | Freq: Two times a day (BID) | ORAL | Status: DC

## 2015-02-27 MED ORDER — ROSUVASTATIN CALCIUM 10 MG PO TABS: 5.0000 mg | ORAL_TABLET | Freq: Every day | ORAL | Status: DC

## 2015-02-27 NOTE — Progress Notes (Signed)
Subjective:    Patient ID: Katrina Yang, female    DOB: 1940/05/22, 74 y.o.   MRN: [id number]  HPI Pt states 6 weeks of moderate pain at the right shoulder.  She has slight assoc numbness at the RUE. No injury. Past Medical History  Diagnosis Date  . Hypertension   . Palpitations   . Pure hypercholesterolemia   . CVA (cerebral vascular accident) ([place])     hx of  . Secondary hyperparathyroidism (of renal origin)   . Pulmonary nodule   . GERD (gastroesophageal reflux disease)   . Diverticulosis of colon (without mention of hemorrhage)   . Colonic polyp     hx of  . Degenerative joint disease   . Personal history of unspecified urinary disorder   . Osteoporosis, unspecified   . Unspecified hypothyroidism   . History of hysterectomy   . Occipital neuralgia of left side 08/30/2013  . Cervical spondylosis without myelopathy 02/01/2015    Past Surgical History  Procedure Laterality Date  . Back surgery      diskectomy L-5. Multiple back surgeries  . Abdominal hysterectomy      Social History   Social History  . Marital Status: Married    Spouse Name: [name]  . Number of Children: 1  . Years of Education: 2   Occupational History  . Metallurgist    Social History Main Topics  . Smoking status: Never Smoker   . Smokeless tobacco: Never Used  . Alcohol Use: No  . Drug Use: No  . Sexual Activity: Not on file   Other Topics Concern  . Not on file   Social History Narrative   Pt lives at home with her spouse.   Patient drinks 1 glass of tea every other day.   Patient is right handed.    Current Outpatient Prescriptions on File Prior to Visit  Medication Sig Dispense Refill  . aspirin 81 MG tablet Take 1 tablet (81 mg total) by mouth daily.    . calcitRIOL (ROCALTROL) 0.25 MCG capsule Take 1 capsule (0.25 mcg total) by mouth daily. 90 capsule 3  . clopidogrel (PLAVIX) 75 MG tablet TAKE 1 TABLET DAILY 90 tablet 1  . gabapentin (NEURONTIN) 100 MG capsule Take 1  capsule (100 mg total) by mouth daily. 90 capsule 1  . levothyroxine (SYNTHROID, LEVOTHROID) 75 MCG tablet TAKE 1 TABLET DAILY BEFORE BREAKFAST 90 tablet 2  . lisinopril (PRINIVIL,ZESTRIL) 2.5 MG tablet Take 1 tablet (2.5 mg total) by mouth at bedtime. 180 tablet 1  . nortriptyline (PAMELOR) 10 MG capsule One at night for 2 weeks, then take 2 capsules at night 60 capsule 3  . traMADol (ULTRAM) 50 MG tablet Take 1 tablet (50 mg total) by mouth every 6 (six) hours as needed. 60 tablet 1   No current facility-administered medications on file prior to visit.    Allergies  Allergen Reactions  . Iodine   . Iohexol   . Lovastatin   . Metronidazole     REACTION: Reaction not known  . Sulfamethoxazole-Trimethoprim   . Sulfonamide Derivatives   . Codeine Rash  . Latex Rash    rash    Family History  Problem Relation Age of Onset  . Kidney cancer Father   . Heart attack Father   . Heart disease Father   . Colon cancer Mother   . Ovarian cancer Mother   . Heart disease Mother   . Heart failure Mother   . Stomach cancer  Neg Hx     BP 142/84 mmHg  Pulse 69  Temp(Src) 98.1 F (36.7 C) (Oral)  Wt 124 lb (56.246 kg)  SpO2 96%  Review of Systems Denies rash.  No change in chronic low-back pain.      Objective:   Physical Exam VITAL SIGNS:  See vs page GENERAL: no distress Right shoulder: ROM is limited by pain. Neuro: sensation is intact to touch on the RUE.   Lab Results  Component Value Date   WBC 6.2 02/27/2015   HGB 11.1* 02/27/2015   HCT 33.1* 02/27/2015   MCV 90.0 02/27/2015   PLT 288.0 02/27/2015   Lab Results  Component Value Date   CHOL 212* 02/27/2015   HDL 90.60 02/27/2015   LDLCALC 106* 02/27/2015   LDLDIRECT 107.3 02/14/2013   TRIG 75.0 02/27/2015   CHOLHDL 2 02/27/2015      Assessment & Plan:  Shoulder pain, new, uncertain etiology.  Please let me know if you want to see a specialist.  Anemia, persistent.  Please continue the same  iron. Dyslipidemia, worse. Please resume taking the pravastatin.    Subjective:   Patient here for Medicare annual wellness visit and management of other chronic and acute problems.     Risk factors: advanced age    40 of Physicians Providing Medical Care to Patient:  See "snapshot"   Activities of Daily Living: In your present state of health, do you have any difficulty performing the following activities?:  Preparing food and eating?: No  Bathing yourself: No  Getting dressed: No  Using the toilet:No  Moving around from place to place: No  In the past year have you fallen or had a near fall?:No    Home Safety: Has smoke detector and wears seat belts. Firearms are safely stored. No excess sun exposure.  Diet and Exercise  Current exercise habits: pt says good Dietary issues discussed: pt reports a healthy diet   Depression Screen  Q1: Over the past two weeks, have you felt down, depressed or hopeless? no  Q2: Over the past two weeks, have you felt little interest or pleasure in doing things? no   The following portions of the patient's history were reviewed and updated as appropriate: allergies, current medications, past family history, past medical history, past social history, past surgical history and problem list.   Review of Systems  Denies hearing loss, and visual loss.   Objective:   Vision:  Sees opthalmologist Hearing: grossly normal Body mass index:  See vs page Msk: pt easily and quickly performs "get-up-and-go" from a sitting position.   Cognitive Impairment Assessment: cognition, memory and judgment appear normal.  remembers 3/3 at 5 minutes.  excellent recall.  can easily read and write a sentence.  alert and oriented x 3.     Assessment:   Medicare wellness utd on preventive parameters    Plan:   During the course of the visit the patient was educated and counseled about appropriate screening and preventive services including:        Fall  prevention   Screening mammography  Bone densitometry screening  Diabetes screening  Nutrition counseling   Vaccines / LABS Zostavax / Pneumococcal Vaccine  today   Patient Instructions (the written plan) was given to the patient.

## 2015-02-27 NOTE — Telephone Encounter (Signed)
please call patient: Sorry, the mychart message was an error.  You have mild anemia. Please send hemoccults. Cholesterol is slightly high. i have sent a prescription to your pharmacy, for the crestor

## 2015-02-27 NOTE — Patient Instructions (Addendum)
please consider these measures for your health:  minimize alcohol.  do not use tobacco products.  have a colonoscopy at least every 10 years from age 73.  Women should have an annual mammogram from age 70.  keep firearms safely stored.  always use seat belts.  have working smoke alarms in your home.  see an eye doctor and dentist regularly.  never drive under the influence of alcohol or drugs (including prescription drugs).  those with fair skin should take precautions against the sun. please let me know what your wishes would be, if artificial life support measures should become necessary.  it is critically important to prevent falling down (keep floor areas well-lit, dry, and free of loose objects.  If you have a cane, walker, or wheelchair, you should use it, even for short trips around the house.  Also, try not to rush).   Please let me know if you want to see a specialist about your shoulder.   Please send [place] a copy of the power of attorney and advance directive, so we can keep on file.   Please come back for a follow-up appointment in 6 months blood tests are requested for you today.  We'll let you know about the results.

## 2015-02-28 ENCOUNTER — Telehealth: Payer: Self-pay | Admitting: Endocrinology

## 2015-02-28 ENCOUNTER — Other Ambulatory Visit: Payer: Self-pay

## 2015-02-28 MED ORDER — ROSUVASTATIN CALCIUM 10 MG PO TABS: 5.0000 mg | ORAL_TABLET | Freq: Every day | ORAL | Status: DC

## 2015-02-28 NOTE — Telephone Encounter (Signed)
I contacted the pt. Patient wanted to advised MD that her kidney doctor has put her on a iron supplement due to her anemia. Patient wanted to know if she should still do the hemoccult cards? The patient just got the message to start taking the iron this morning from her kidney Doctor.  Please advise, Thanks!

## 2015-02-28 NOTE — Telephone Encounter (Signed)
Left a voicemail advising of note below. Hemoccult cards sent.

## 2015-02-28 NOTE — Telephone Encounter (Signed)
Please call the pt back she has questions about the info you gave her

## 2015-02-28 NOTE — Telephone Encounter (Signed)
Yes, please do.  This is because we not only want to get it better, but we need to make sure you are not slowly bleeding internally.

## 2015-02-28 NOTE — Telephone Encounter (Signed)
Patient stated that Dr Loanne Drilling put her on medication she is already on please advise

## 2015-03-01 NOTE — Telephone Encounter (Signed)
I contacted the pt and left a voicemail advising of note below. Requested call back if the patient would like to discuss.

## 2015-03-01 NOTE — Telephone Encounter (Signed)
Left voicemail advising the patient to please do the hemoccult cards. Cards mailed to the patient.

## 2015-03-12 ENCOUNTER — Other Ambulatory Visit: Payer: Self-pay | Admitting: Neurology

## 2015-03-12 ENCOUNTER — Ambulatory Visit
Admission: RE | Admit: 2015-03-12 | Discharge: 2015-03-12 | Disposition: A | Discharge status: Home / Self Care | Payer: Medicare Other | Source: Ambulatory Visit | Attending: Neurology | Admitting: Neurology

## 2015-03-12 DIAGNOSIS — M542 Cervicalgia: Secondary | ICD-10-CM

## 2015-03-12 DIAGNOSIS — M47812 Spondylosis without myelopathy or radiculopathy, cervical region: Secondary | ICD-10-CM

## 2015-03-14 ENCOUNTER — Other Ambulatory Visit: Payer: Medicare Other

## 2015-03-14 ENCOUNTER — Ambulatory Visit
Admission: RE | Admit: 2015-03-14 | Discharge: 2015-03-14 | Disposition: A | Discharge status: Home / Self Care | Payer: Medicare Other | Source: Ambulatory Visit | Attending: Neurology | Admitting: Neurology

## 2015-03-14 DIAGNOSIS — I639 Cerebral infarction, unspecified: Secondary | ICD-10-CM | POA: Insufficient documentation

## 2015-03-14 DIAGNOSIS — N390 Urinary tract infection, site not specified: Secondary | ICD-10-CM | POA: Insufficient documentation

## 2015-03-14 DIAGNOSIS — I6782 Cerebral ischemia: Secondary | ICD-10-CM | POA: Insufficient documentation

## 2015-03-14 DIAGNOSIS — E785 Hyperlipidemia, unspecified: Secondary | ICD-10-CM | POA: Insufficient documentation

## 2015-03-14 DIAGNOSIS — M542 Cervicalgia: Secondary | ICD-10-CM

## 2015-03-14 DIAGNOSIS — M47812 Spondylosis without myelopathy or radiculopathy, cervical region: Secondary | ICD-10-CM | POA: Diagnosis not present

## 2015-03-14 DIAGNOSIS — N261 Atrophy of kidney (terminal): Secondary | ICD-10-CM | POA: Insufficient documentation

## 2015-03-14 DIAGNOSIS — G629 Polyneuropathy, unspecified: Secondary | ICD-10-CM | POA: Insufficient documentation

## 2015-03-14 DIAGNOSIS — M179 Osteoarthritis of knee, unspecified: Secondary | ICD-10-CM | POA: Insufficient documentation

## 2015-03-14 DIAGNOSIS — M1710 Unilateral primary osteoarthritis, unspecified knee: Secondary | ICD-10-CM | POA: Insufficient documentation

## 2015-03-14 DIAGNOSIS — G939 Disorder of brain, unspecified: Secondary | ICD-10-CM | POA: Insufficient documentation

## 2015-03-14 MED ORDER — IOHEXOL 300 MG/ML  SOLN
1.0000 mL | Freq: Once | INTRAMUSCULAR | Status: DC | PRN
  Administered 2015-03-14: 1 mL via EPIDURAL

## 2015-03-14 MED ORDER — TRIAMCINOLONE ACETONIDE 40 MG/ML IJ SUSP (RADIOLOGY)
60.0000 mg | Freq: Once | INTRAMUSCULAR | Status: AC
  Administered 2015-03-14: 60 mg via EPIDURAL

## 2015-03-14 NOTE — Discharge Instructions (Signed)

## 2015-03-16 ENCOUNTER — Ambulatory Visit (INDEPENDENT_AMBULATORY_CARE_PROVIDER_SITE_OTHER)
Admission: RE | Admit: 2015-03-16 | Discharge: 2015-03-16 | Disposition: A | Discharge status: Home / Self Care | Payer: Medicare Other | Source: Ambulatory Visit | Attending: Endocrinology | Admitting: Endocrinology

## 2015-03-16 DIAGNOSIS — M810 Age-related osteoporosis without current pathological fracture: Secondary | ICD-10-CM | POA: Diagnosis not present

## 2015-04-02 ENCOUNTER — Other Ambulatory Visit: Payer: Self-pay

## 2015-04-02 MED ORDER — METOPROLOL TARTRATE 25 MG PO TABS: 25.0000 mg | ORAL_TABLET | Freq: Two times a day (BID) | ORAL | Status: DC

## 2015-04-05 ENCOUNTER — Telehealth: Payer: Self-pay | Admitting: Endocrinology

## 2015-04-05 ENCOUNTER — Other Ambulatory Visit (INDEPENDENT_AMBULATORY_CARE_PROVIDER_SITE_OTHER): Payer: Medicare Other

## 2015-04-05 DIAGNOSIS — D649 Anemia, unspecified: Secondary | ICD-10-CM

## 2015-04-05 LAB — HEMOCCULT SLIDES (X 3 CARDS)
Fecal Occult Blood: NEGATIVE
OCCULT 1: NEGATIVE
OCCULT 2: NEGATIVE
OCCULT 3: NEGATIVE
OCCULT 4: NEGATIVE
OCCULT 5: NEGATIVE

## 2015-04-05 NOTE — Telephone Encounter (Signed)
Blanch Media from [facility] needs hemoccult order placed please

## 2015-04-05 NOTE — Telephone Encounter (Signed)
Order placed per request

## 2015-04-07 ENCOUNTER — Other Ambulatory Visit: Payer: Self-pay | Admitting: Neurology

## 2015-04-13 DIAGNOSIS — M1712 Unilateral primary osteoarthritis, left knee: Secondary | ICD-10-CM | POA: Diagnosis not present

## 2015-05-16 ENCOUNTER — Telehealth: Payer: Self-pay | Admitting: Gastroenterology

## 2015-05-17 NOTE — Telephone Encounter (Signed)
I have left message for the patient to call back 

## 2015-05-17 NOTE — Telephone Encounter (Signed)
Patient is new on an oral iron by her "kidney doctor". She has issues of loose stool followed by spells of feeling constipated. Her stools are noticeably darker which she attributes to the oral iron supplement. Her main concern is the low abdominal discomfort. She "wants to see Cecille Rubin" to "check this out". Appointment made. Denies any frank blood.

## 2015-05-27 ENCOUNTER — Other Ambulatory Visit: Payer: Self-pay | Admitting: Neurology

## 2015-06-01 ENCOUNTER — Ambulatory Visit: Payer: Medicare Other | Admitting: Physician Assistant

## 2015-06-06 ENCOUNTER — Other Ambulatory Visit: Payer: Self-pay | Admitting: Endocrinology

## 2015-06-11 DIAGNOSIS — I10 Essential (primary) hypertension: Secondary | ICD-10-CM | POA: Diagnosis not present

## 2015-06-11 DIAGNOSIS — N184 Chronic kidney disease, stage 4 (severe): Secondary | ICD-10-CM | POA: Diagnosis not present

## 2015-06-11 DIAGNOSIS — D631 Anemia in chronic kidney disease: Secondary | ICD-10-CM | POA: Diagnosis not present

## 2015-06-11 DIAGNOSIS — N189 Chronic kidney disease, unspecified: Secondary | ICD-10-CM | POA: Diagnosis not present

## 2015-06-11 DIAGNOSIS — N2581 Secondary hyperparathyroidism of renal origin: Secondary | ICD-10-CM | POA: Diagnosis not present

## 2015-06-11 DIAGNOSIS — D509 Iron deficiency anemia, unspecified: Secondary | ICD-10-CM | POA: Diagnosis not present

## 2015-06-11 DIAGNOSIS — N390 Urinary tract infection, site not specified: Secondary | ICD-10-CM | POA: Diagnosis not present

## 2015-06-11 DIAGNOSIS — N281 Cyst of kidney, acquired: Secondary | ICD-10-CM | POA: Diagnosis not present

## 2015-06-19 DIAGNOSIS — M1712 Unilateral primary osteoarthritis, left knee: Secondary | ICD-10-CM | POA: Diagnosis not present

## 2015-06-21 ENCOUNTER — Ambulatory Visit: Payer: Medicare Other | Admitting: Neurology

## 2015-06-21 ENCOUNTER — Ambulatory Visit: Payer: Medicare Other | Admitting: Adult Health

## 2015-06-25 ENCOUNTER — Encounter: Payer: Self-pay | Admitting: Endocrinology

## 2015-06-25 ENCOUNTER — Other Ambulatory Visit: Payer: Self-pay

## 2015-06-25 ENCOUNTER — Ambulatory Visit (INDEPENDENT_AMBULATORY_CARE_PROVIDER_SITE_OTHER): Payer: Medicare Other | Admitting: Endocrinology

## 2015-06-25 ENCOUNTER — Ambulatory Visit
Admission: RE | Admit: 2015-06-25 | Discharge: 2015-06-25 | Disposition: A | Discharge status: Home / Self Care | Payer: Medicare Other | Source: Ambulatory Visit | Attending: Endocrinology | Admitting: Endocrinology

## 2015-06-25 VITALS — BP 112/80 | HR 81 | Temp 97.9°F | Ht 61.0 in | Wt 124.0 lb

## 2015-06-25 DIAGNOSIS — R0602 Shortness of breath: Secondary | ICD-10-CM | POA: Diagnosis not present

## 2015-06-25 DIAGNOSIS — R059 Cough, unspecified: Secondary | ICD-10-CM

## 2015-06-25 DIAGNOSIS — R05 Cough: Secondary | ICD-10-CM | POA: Diagnosis not present

## 2015-06-25 MED ORDER — FLUTICASONE-SALMETEROL 100-50 MCG/DOSE IN AEPB: 1.0000 | INHALATION_SPRAY | Freq: Two times a day (BID) | RESPIRATORY_TRACT | Status: DC

## 2015-06-25 MED ORDER — PROMETHAZINE-DM 6.25-15 MG/5ML PO SYRP: 5.0000 mL | ORAL_SOLUTION | Freq: Four times a day (QID) | ORAL | Status: DC | PRN

## 2015-06-25 NOTE — Progress Notes (Signed)
Subjective:    Patient ID: Katrina Yang, female    DOB: Feb 11, 1941, 75 y.o.   MRN: [id number]  HPI Pt states 5 days of moderate cough in the chest, and assoc fever.  She was rx'ed azithromycin.  Since then, she feels no better, except fever is resolved.  Past Medical History  Diagnosis Date  . Hypertension   . Palpitations   . Pure hypercholesterolemia   . CVA (cerebral vascular accident) ([place])     hx of  . Secondary hyperparathyroidism (of renal origin)   . Pulmonary nodule   . GERD (gastroesophageal reflux disease)   . Diverticulosis of colon (without mention of hemorrhage)   . Colonic polyp     hx of  . Degenerative joint disease   . Personal history of unspecified urinary disorder   . Osteoporosis, unspecified   . Unspecified hypothyroidism   . History of hysterectomy   . Occipital neuralgia of left side 08/30/2013  . Cervical spondylosis without myelopathy 02/01/2015    Past Surgical History  Procedure Laterality Date  . Back surgery      diskectomy L-5. Multiple back surgeries  . Abdominal hysterectomy      Social History   Social History  . Marital Status: Married    Spouse Name: [name]  . Number of Children: 1  . Years of Education: 2   Occupational History  . Metallurgist    Social History Main Topics  . Smoking status: Never Smoker   . Smokeless tobacco: Never Used  . Alcohol Use: No  . Drug Use: No  . Sexual Activity: Not on file   Other Topics Concern  . Not on file   Social History Narrative   Pt lives at home with her spouse.   Patient drinks 1 glass of tea every other day.   Patient is right handed.    Current Outpatient Prescriptions on File Prior to Visit  Medication Sig Dispense Refill  . aspirin 81 MG tablet Take 1 tablet (81 mg total) by mouth daily.    . calcitRIOL (ROCALTROL) 0.25 MCG capsule TAKE 1 CAPSULE DAILY (NEED APPOINTMENT FOR REFILLS) 90 capsule 0  . clopidogrel (PLAVIX) 75 MG tablet TAKE 1 TABLET DAILY 90 tablet 3  .  gabapentin (NEURONTIN) 100 MG capsule TAKE 1 CAPSULE DAILY 90 capsule 0  . levothyroxine (SYNTHROID, LEVOTHROID) 75 MCG tablet TAKE 1 TABLET DAILY BEFORE BREAKFAST 90 tablet 2  . metoprolol tartrate (LOPRESSOR) 25 MG tablet Take 1 tablet (25 mg total) by mouth 2 (two) times daily. 180 tablet 3  . rosuvastatin (CRESTOR) 10 MG tablet Take 0.5 tablets (5 mg total) by mouth daily. Take 1/2 tab daily 90 tablet 3  . ferrous sulfate 325 (65 FE) MG tablet Take 325 mg by mouth. Reported on 06/25/2015     No current facility-administered medications on file prior to visit.    Allergies  Allergen Reactions  . Iodinated Diagnostic Agents Swelling    Throat swelling  . Lovastatin   . Metronidazole   . Sulfonamide Derivatives   . Codeine Rash  . Latex Rash    Family History  Problem Relation Age of Onset  . Kidney cancer Father   . Heart attack Father   . Heart disease Father   . Colon cancer Mother   . Ovarian cancer Mother   . Heart disease Mother   . Heart failure Mother   . Stomach cancer Neg Hx     BP 112/80 mmHg  Pulse  81  Temp(Src) 97.9 F (36.6 C) (Oral)  Ht 5\' 1"  (1.549 m)  Wt 124 lb (56.246 kg)  BMI 23.44 kg/m2  SpO2 96%  Review of Systems She has wheezing, but no earache.  No n/v.  No nasal congestion.      Objective:   Physical Exam VITAL SIGNS:  See vs page GENERAL: no distress head: no deformity eyes: no periorbital swelling, no proptosis external nose and ears are normal mouth: no lesion seen Both eac's and tm's are normal.  LUNGS:  Clear to auscultation.   CXR: NAD    Assessment & Plan:  Acute bronchitis, new HTN: slightly overcontrolled, prob due to decreased PO intake during illness.   Patient is advised the following: Patient Instructions  A chest x-ray is requested for you today.  We'll let you know about the results. i have sent 2 prescriptions to your pharmacy: for an inhaler and cough syrup. I hope you feel better soon.  If you don't feel  better by next week, please call back.  Please call sooner if you get worse. Also, please skip 3 days of the amlodipine.

## 2015-06-25 NOTE — Patient Instructions (Addendum)
A chest x-ray is requested for you today.  We'll let you know about the results. i have sent 2 prescriptions to your pharmacy: for an inhaler and cough syrup. I hope you feel better soon.  If you don't feel better by next week, please call back.  Please call sooner if you get worse. Also, please skip 3 days of the amlodipine.

## 2015-06-26 DIAGNOSIS — M1712 Unilateral primary osteoarthritis, left knee: Secondary | ICD-10-CM | POA: Diagnosis not present

## 2015-06-28 DIAGNOSIS — N184 Chronic kidney disease, stage 4 (severe): Secondary | ICD-10-CM | POA: Diagnosis not present

## 2015-07-03 DIAGNOSIS — M1712 Unilateral primary osteoarthritis, left knee: Secondary | ICD-10-CM | POA: Diagnosis not present

## 2015-07-06 ENCOUNTER — Other Ambulatory Visit: Payer: Self-pay | Admitting: Endocrinology

## 2015-07-10 ENCOUNTER — Telehealth: Payer: Self-pay | Admitting: Endocrinology

## 2015-07-10 ENCOUNTER — Telehealth: Payer: Self-pay | Admitting: Neurology

## 2015-07-10 MED ORDER — CALCITRIOL 0.25 MCG PO CAPS: ORAL_CAPSULE | ORAL | Status: DC

## 2015-07-10 MED ORDER — GABAPENTIN 100 MG PO CAPS: 100.0000 mg | ORAL_CAPSULE | Freq: Every day | ORAL | Status: DC

## 2015-07-10 NOTE — Telephone Encounter (Signed)
Patient called to request refill of gabapentin (NEURONTIN) 100 MG capsule 90capsules, NOTE Pharmacy change to [place] [id number].

## 2015-07-10 NOTE — Telephone Encounter (Signed)
Rx submitted per pt's request.  

## 2015-07-10 NOTE — Telephone Encounter (Signed)
Pt needs her [facility] [place], pt would like 90 days

## 2015-07-10 NOTE — Telephone Encounter (Signed)
It appears that pt missed the February 2017 appt as instructed to follow up by Dr. Jannifer Franklin. I called and made an appt with pt for 3/22/ at 10:00. Pt verbalized understanding. Will refill her gabapentin [facility] [address]

## 2015-07-12 DIAGNOSIS — I10 Essential (primary) hypertension: Secondary | ICD-10-CM | POA: Diagnosis not present

## 2015-07-12 DIAGNOSIS — N2581 Secondary hyperparathyroidism of renal origin: Secondary | ICD-10-CM | POA: Diagnosis not present

## 2015-07-12 DIAGNOSIS — N184 Chronic kidney disease, stage 4 (severe): Secondary | ICD-10-CM | POA: Diagnosis not present

## 2015-07-12 DIAGNOSIS — N189 Chronic kidney disease, unspecified: Secondary | ICD-10-CM | POA: Diagnosis not present

## 2015-07-16 DIAGNOSIS — J209 Acute bronchitis, unspecified: Secondary | ICD-10-CM | POA: Diagnosis not present

## 2015-07-19 ENCOUNTER — Telehealth: Payer: Self-pay | Admitting: *Deleted

## 2015-07-19 NOTE — Telephone Encounter (Signed)
LMVM for pt to return call about rescheduling appt to another time.

## 2015-07-20 NOTE — Telephone Encounter (Signed)
Rescheduled to 08-06-15 Monday at 0730.  (resc from 07-25-15). Husband has appt 08-06-15 as well.

## 2015-07-25 ENCOUNTER — Ambulatory Visit: Payer: Self-pay | Admitting: Neurology

## 2015-07-30 ENCOUNTER — Encounter: Payer: Self-pay | Admitting: Endocrinology

## 2015-07-31 ENCOUNTER — Encounter: Payer: Self-pay | Admitting: Endocrinology

## 2015-07-31 DIAGNOSIS — N3001 Acute cystitis with hematuria: Secondary | ICD-10-CM | POA: Diagnosis not present

## 2015-07-31 DIAGNOSIS — M1712 Unilateral primary osteoarthritis, left knee: Secondary | ICD-10-CM | POA: Diagnosis not present

## 2015-07-31 DIAGNOSIS — N3090 Cystitis, unspecified without hematuria: Secondary | ICD-10-CM | POA: Diagnosis not present

## 2015-08-06 ENCOUNTER — Encounter: Payer: Self-pay | Admitting: Neurology

## 2015-08-06 ENCOUNTER — Ambulatory Visit (INDEPENDENT_AMBULATORY_CARE_PROVIDER_SITE_OTHER): Payer: Medicare Other | Admitting: Neurology

## 2015-08-06 VITALS — BP 136/76 | HR 64 | Ht 61.0 in | Wt 125.0 lb

## 2015-08-06 DIAGNOSIS — M5481 Occipital neuralgia: Secondary | ICD-10-CM

## 2015-08-06 DIAGNOSIS — M47812 Spondylosis without myelopathy or radiculopathy, cervical region: Secondary | ICD-10-CM

## 2015-08-06 MED ORDER — CARBAMAZEPINE 100 MG PO CHEW: CHEWABLE_TABLET | ORAL | Status: DC

## 2015-08-06 NOTE — Progress Notes (Signed)
Reason for visit: Shoulder pain  Katrina Yang is an 75 y.o. female  History of present illness:  Katrina Yang is a 75 year old right-handed white female with a history of chronic renal insufficiency, GFR is around the 20 range. The patient is on gabapentin taking 100 mg at night for discomfort down the left side following a stroke 13 years ago. The patient also has occipital neuralgia. She was seen in September 2016 with onset of right-sided shoulder pain in the interscapular area that would occasionally be associated with discomfort down the right arm. MRI of the cervical spine surprisingly did not show any evidence of nerve root compression. The patient still has episodes of severe pain in the right shoulder occurring 3-4 times a week, oftentimes not induced by any particular activity. The patient has significant degenerative arthritis of the left knee, she has had injections in the knee, but she has been told that she is not a surgical candidate secondary to the chronic renal insufficiency. The patient has numbness of the left foot. She comes to the office today for an evaluation.  Past Medical History  Diagnosis Date  . Hypertension   . Palpitations   . Pure hypercholesterolemia   . CVA (cerebral vascular accident) ([place])     hx of  . Secondary hyperparathyroidism (of renal origin)   . Pulmonary nodule   . GERD (gastroesophageal reflux disease)   . Diverticulosis of colon (without mention of hemorrhage)   . Colonic polyp     hx of  . Degenerative joint disease   . Personal history of unspecified urinary disorder   . Osteoporosis, unspecified   . Unspecified hypothyroidism   . History of hysterectomy   . Occipital neuralgia of left side 08/30/2013  . Cervical spondylosis without myelopathy 02/01/2015    Past Surgical History  Procedure Laterality Date  . Back surgery      diskectomy L-5. Multiple back surgeries  . Abdominal hysterectomy      Family History  Problem  Relation Age of Onset  . Kidney cancer Father   . Heart attack Father   . Heart disease Father   . Colon cancer Mother   . Ovarian cancer Mother   . Heart disease Mother   . Heart failure Mother   . Stomach cancer Neg Hx     Social history:  reports that she has never smoked. She has never used smokeless tobacco. She reports that she does not drink alcohol or use illicit drugs.    Allergies  Allergen Reactions  . Iodinated Diagnostic Agents Swelling    Throat swelling  . Lovastatin   . Metronidazole   . Sulfonamide Derivatives   . Codeine Rash  . Latex Rash    Medications:  Prior to Admission medications   Medication Sig Start Date End Date Taking? Authorizing Provider  amLODipine (NORVASC) 10 MG tablet Take by mouth. 06/19/15  Yes Historical Provider, MD  aspirin 81 MG tablet Take 1 tablet (81 mg total) by mouth daily. 12/28/12  Yes Larey Dresser, MD  calcitRIOL (ROCALTROL) 0.25 MCG capsule TAKE 1 CAPSULE DAILY 07/10/15  Yes Renato Shin, MD  ciprofloxacin (CIPRO) 500 MG tablet  08/03/15  Yes Historical Provider, MD  clopidogrel (PLAVIX) 75 MG tablet TAKE 1 TABLET DAILY 04/09/15  Yes Kathrynn Ducking, MD  gabapentin (NEURONTIN) 100 MG capsule Take 1 capsule (100 mg total) by mouth daily. 07/10/15  Yes Kathrynn Ducking, MD  levothyroxine (SYNTHROID, LEVOTHROID) 75 MCG tablet TAKE  1 TABLET DAILY BEFORE BREAKFAST 07/06/15  Yes Renato Shin, MD  metoprolol tartrate (LOPRESSOR) 25 MG tablet Take 1 tablet (25 mg total) by mouth 2 (two) times daily. 04/02/15  Yes Renato Shin, MD  rosuvastatin (CRESTOR) 10 MG tablet Take 0.5 tablets (5 mg total) by mouth daily. Take 1/2 tab daily 02/28/15  Yes Renato Shin, MD    ROS:  Out of a complete 14 system review of symptoms, the patient complains only of the following symptoms, and all other reviewed systems are negative.  Joint pain, joint swelling, back pain, achy muscles, muscle cramps, walking difficulty, neck pain, neck stiffness Painful  urination, difficulty with urination Numbness, weakness  Blood pressure 136/76, pulse 64, height 5\' 1"  (1.549 m), weight 125 lb (56.7 kg).  Physical Exam  General: The patient is alert and cooperative at the time of the examination.  Neuromuscular: The patient lacks about 15 of full lateral rotation of the cervical spine bilaterally.  Skin: No significant peripheral edema is noted.   Neurologic Exam  Mental status: The patient is alert and oriented x 3 at the time of the examination. The patient has apparent normal recent and remote memory, with an apparently normal attention span and concentration ability.   Cranial nerves: Facial symmetry is present. Speech is normal, no aphasia or dysarthria is noted. Extraocular movements are full. Visual fields are full.  Motor: The patient has good strength in all 4 extremities.  Sensory examination: Soft touch sensation is symmetric on the face, arms, and legs.  Coordination: The patient has good finger-nose-finger and heel-to-shin bilaterally.  Gait and station: The patient has a normal gait. Tandem gait is normal. Romberg is negative. No drift is seen.  Reflexes: Deep tendon reflexes are symmetric.   Assessment/Plan:  1. Chronic renal insufficiency  2. Left-sided neuropathic pain  3. Right shoulder discomfort, right neck discomfort  4. Left occipital neuralgia  The patient has ongoing episodes of right shoulder discomfort that come and go, she also describes left-sided discomfort. Her chronic renal insufficiency may limit some the dosing of medications. The patient will be given a trial on low-dose carbamazepine, I have offered physical therapy for her neck and shoulder, but she does not wish to consider this. An epidural steroid injection was not extremely beneficial in November 2016. She will follow-up in about 5 months. A prescription was given for carbamazepine taking 100 mg, one half tablet twice daily for 2 weeks then go to  100 mg twice daily.  Jill Alexanders MD 08/06/2015 1:27 PM  [facility] [address] [place], [place] [address]  Phone [phone] Fax [phone]

## 2015-08-14 ENCOUNTER — Telehealth: Payer: Self-pay | Admitting: Neurology

## 2015-08-14 ENCOUNTER — Telehealth: Payer: Self-pay | Admitting: Endocrinology

## 2015-08-14 MED ORDER — ROSUVASTATIN CALCIUM 10 MG PO TABS: 5.0000 mg | ORAL_TABLET | Freq: Every day | ORAL | Status: DC

## 2015-08-14 MED ORDER — CLOPIDOGREL BISULFATE 75 MG PO TABS: 75.0000 mg | ORAL_TABLET | Freq: Every day | ORAL | Status: DC

## 2015-08-14 MED ORDER — LEVOTHYROXINE SODIUM 75 MCG PO TABS: 75.0000 ug | ORAL_TABLET | Freq: Every day | ORAL | Status: DC

## 2015-08-14 NOTE — Telephone Encounter (Signed)
Patient is calling to order as new 90 day Rx clopidogrel 75 mg tablets [facility] [address], [place], [place]

## 2015-08-14 NOTE — Telephone Encounter (Signed)
Appts up-to-date. Plavix retailed to pharmacy per pt request.

## 2015-08-14 NOTE — Telephone Encounter (Signed)
Rx submitted per pt's request.  

## 2015-08-14 NOTE — Telephone Encounter (Signed)
Patient need a refill for rosuvastatin (CRESTOR) 10 MG tablet, levothyroxine (SYNTHROID, LEVOTHROID) 75 MCG tablet send to  [facility] [address] [place], [place] [phone] (Phone) [phone] (Fax)

## 2015-08-16 ENCOUNTER — Other Ambulatory Visit: Payer: Self-pay | Admitting: Endocrinology

## 2015-08-16 DIAGNOSIS — I6523 Occlusion and stenosis of bilateral carotid arteries: Secondary | ICD-10-CM

## 2015-08-17 MED ORDER — CLOPIDOGREL BISULFATE 75 MG PO TABS: 75.0000 mg | ORAL_TABLET | Freq: Every day | ORAL | Status: DC

## 2015-08-17 NOTE — Telephone Encounter (Addendum)
[facility] [place] Spoke to [employer]. 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 Advised rx plavix sent electronically on 4/11 quantity of 90 with 2 refills. She said their fax has been down and would like rx resent. Advised I will go ahead and resend. She verbalized understanding.

## 2015-08-17 NOTE — Addendum Note (Signed)
Addended by: Rossie Muskrat L on: 08/17/2015 11:39 AM   Modules accepted: Orders

## 2015-08-17 NOTE — Telephone Encounter (Signed)
Katrina Yang [id number] called to request clopidogrel (PLAVIX) 75 MG tablet be re-sent electronically or call to give verbal order, states they never received Rx.

## 2015-08-20 DIAGNOSIS — M1712 Unilateral primary osteoarthritis, left knee: Secondary | ICD-10-CM | POA: Diagnosis not present

## 2015-08-23 ENCOUNTER — Telehealth: Payer: Self-pay

## 2015-08-23 NOTE — Telephone Encounter (Signed)
Pt on waiting list for earlier appt. However, she was seen 2 weeks ago so appt is no longer needed.

## 2015-08-28 ENCOUNTER — Ambulatory Visit (INDEPENDENT_AMBULATORY_CARE_PROVIDER_SITE_OTHER): Payer: Medicare Other | Admitting: Endocrinology

## 2015-08-28 ENCOUNTER — Ambulatory Visit (HOSPITAL_COMMUNITY)
Admission: RE | Admit: 2015-08-28 | Discharge: 2015-08-28 | Disposition: A | Discharge status: Home / Self Care | Payer: Medicare Other | Source: Ambulatory Visit | Attending: Cardiovascular Disease | Admitting: Cardiovascular Disease

## 2015-08-28 ENCOUNTER — Encounter: Payer: Self-pay | Admitting: Endocrinology

## 2015-08-28 VITALS — BP 142/92 | HR 89 | Temp 97.8°F | Ht 61.0 in | Wt 124.0 lb

## 2015-08-28 DIAGNOSIS — K219 Gastro-esophageal reflux disease without esophagitis: Secondary | ICD-10-CM | POA: Insufficient documentation

## 2015-08-28 DIAGNOSIS — I6523 Occlusion and stenosis of bilateral carotid arteries: Secondary | ICD-10-CM | POA: Diagnosis not present

## 2015-08-28 DIAGNOSIS — E7800 Pure hypercholesterolemia, unspecified: Secondary | ICD-10-CM | POA: Diagnosis not present

## 2015-08-28 DIAGNOSIS — I10 Essential (primary) hypertension: Secondary | ICD-10-CM | POA: Insufficient documentation

## 2015-08-28 DIAGNOSIS — N184 Chronic kidney disease, stage 4 (severe): Secondary | ICD-10-CM

## 2015-08-28 DIAGNOSIS — D509 Iron deficiency anemia, unspecified: Secondary | ICD-10-CM | POA: Diagnosis not present

## 2015-08-28 LAB — URINALYSIS, ROUTINE W REFLEX MICROSCOPIC
Bilirubin Urine: NEGATIVE
Ketones, ur: NEGATIVE
Leukocytes, UA: NEGATIVE
Nitrite: NEGATIVE
SPECIFIC GRAVITY, URINE: 1.015 (ref 1.000–1.030)
Total Protein, Urine: NEGATIVE
URINE GLUCOSE: NEGATIVE
Urobilinogen, UA: 0.2 (ref 0.0–1.0)
pH: 6 (ref 5.0–8.0)

## 2015-08-28 NOTE — Progress Notes (Signed)
Subjective:    Patient ID: Katrina Yang, female    DOB: Jun 25, 1940, 75 y.o.   MRN: [id number]  HPI The state of at least three ongoing medical problems is addressed today, with interval history of each noted here: Pt was seen at [facility] 2 weeks ago for UTI, and was rx'ed with cipro.  Dysuria is resolved.   Anemia: she takes fe 1/day.  She says nephrol monitors this.  Denies hematuria.   Dyslipidemia: she takes crestor as rx'ed.   Past Medical History  Diagnosis Date  . Hypertension   . Palpitations   . Pure hypercholesterolemia   . CVA (cerebral vascular accident) ([employer])     hx of  . Secondary hyperparathyroidism (of renal origin)   . Pulmonary nodule   . GERD (gastroesophageal reflux disease)   . Diverticulosis of colon (without mention of hemorrhage)   . Colonic polyp     hx of  . Degenerative joint disease   . Personal history of unspecified urinary disorder   . Osteoporosis, unspecified   . Unspecified hypothyroidism   . History of hysterectomy   . Occipital neuralgia of left side 08/30/2013  . Cervical spondylosis without myelopathy 02/01/2015    Past Surgical History  Procedure Laterality Date  . Back surgery      diskectomy L-5. Multiple back surgeries  . Abdominal hysterectomy      Social History   Social History  . Marital Status: Married    Spouse Name: [name]  . Number of Children: 1  . Years of Education: 2   Occupational History  . Metallurgist    Social History Main Topics  . Smoking status: Never Smoker   . Smokeless tobacco: Never Used  . Alcohol Use: No  . Drug Use: No  . Sexual Activity: Not on file   Other Topics Concern  . Not on file   Social History Narrative   Pt lives at home with her spouse.   Patient drinks 1 glass of tea every other day.   Patient is right handed.    Current Outpatient Prescriptions on File Prior to Visit  Medication Sig Dispense Refill  . amLODipine (NORVASC) 10 MG tablet Take by mouth.    Marland Kitchen aspirin 81 MG  tablet Take 1 tablet (81 mg total) by mouth daily.    . calcitRIOL (ROCALTROL) 0.25 MCG capsule TAKE 1 CAPSULE DAILY 90 capsule 2  . carbamazepine (TEGRETOL) 100 MG chewable tablet 1/2 tablet twice a day for 2 weeks and then take 1 tablet twice a day 60 tablet 2  . clopidogrel (PLAVIX) 75 MG tablet Take 1 tablet (75 mg total) by mouth daily. 90 tablet 2  . gabapentin (NEURONTIN) 100 MG capsule Take 1 capsule (100 mg total) by mouth daily. 90 capsule 0  . levothyroxine (SYNTHROID, LEVOTHROID) 75 MCG tablet Take 1 tablet (75 mcg total) by mouth daily before breakfast. 90 tablet 1  . metoprolol tartrate (LOPRESSOR) 25 MG tablet Take 1 tablet (25 mg total) by mouth 2 (two) times daily. 180 tablet 3  . rosuvastatin (CRESTOR) 10 MG tablet Take 0.5 tablets (5 mg total) by mouth daily. 90 tablet 3   No current facility-administered medications on file prior to visit.    Allergies  Allergen Reactions  . Iodinated Diagnostic Agents Swelling    Throat swelling  . Lovastatin   . Metronidazole   . Sulfonamide Derivatives   . Codeine Rash  . Latex Rash    Family History  Problem  Relation Age of Onset  . Kidney cancer Father   . Heart attack Father   . Heart disease Father   . Colon cancer Mother   . Ovarian cancer Mother   . Heart disease Mother   . Heart failure Mother   . Stomach cancer Neg Hx     BP 142/92 mmHg  Pulse 89  Temp(Src) 97.8 F (36.6 C) (Oral)  Ht 5\' 1"  (1.549 m)  Wt 124 lb (56.246 kg)  BMI 23.44 kg/m2  SpO2 95%  Review of Systems Denies fever and BRBPR.     Objective:   Physical Exam VITAL SIGNS:  See vs page. GENERAL: no distress.  Abd: no suprapubic tenderness.   Ext: no edema.   UA: neg Lab Results  Component Value Date   WBC 6.2 02/27/2015   HGB 11.1* 02/27/2015   HCT 33.1* 02/27/2015   MCV 90.0 02/27/2015   PLT 288.0 02/27/2015      Assessment & Plan:  UTI: resolved.  Check c/s. Anemia: she needs hemoccults. Dyslipidemia: she declines recheck  today.  Please continue the same medication.  Patient is advised the following: Patient Instructions  urine tests are requested for you today.  We'll let you know about the results.  here are some tests for blood in the bowels.  please follow the instructions, and return to the lab.   Please come back for a "medicare wellness" appointment in 6 months (must be after 02/27/16).

## 2015-08-28 NOTE — Patient Instructions (Addendum)
urine tests are requested for you today.  We'll let you know about the results.  here are some tests for blood in the bowels.  please follow the instructions, and return to the lab.   Please come back for a "medicare wellness" appointment in 6 months (must be after 02/27/16).

## 2015-08-30 DIAGNOSIS — M1712 Unilateral primary osteoarthritis, left knee: Secondary | ICD-10-CM | POA: Diagnosis not present

## 2015-08-31 LAB — CULTURE, URINE COMPREHENSIVE: Colony Count: 30000

## 2015-09-06 DIAGNOSIS — M23352 Other meniscus derangements, posterior horn of lateral meniscus, left knee: Secondary | ICD-10-CM | POA: Diagnosis not present

## 2015-09-06 DIAGNOSIS — M1712 Unilateral primary osteoarthritis, left knee: Secondary | ICD-10-CM | POA: Diagnosis not present

## 2015-09-06 DIAGNOSIS — M23322 Other meniscus derangements, posterior horn of medial meniscus, left knee: Secondary | ICD-10-CM | POA: Diagnosis not present

## 2015-09-20 ENCOUNTER — Ambulatory Visit (INDEPENDENT_AMBULATORY_CARE_PROVIDER_SITE_OTHER): Payer: Medicare Other | Admitting: Endocrinology

## 2015-09-20 ENCOUNTER — Encounter: Payer: Self-pay | Admitting: Endocrinology

## 2015-09-20 ENCOUNTER — Telehealth: Payer: Self-pay | Admitting: Endocrinology

## 2015-09-20 ENCOUNTER — Ambulatory Visit (HOSPITAL_COMMUNITY)
Admission: RE | Admit: 2015-09-20 | Discharge: 2015-09-20 | Disposition: A | Discharge status: Home / Self Care | Payer: Medicare Other | Source: Ambulatory Visit | Attending: Cardiovascular Disease | Admitting: Cardiovascular Disease

## 2015-09-20 VITALS — BP 132/70 | HR 66 | Temp 98.3°F | Ht 61.5 in | Wt 127.0 lb

## 2015-09-20 DIAGNOSIS — E785 Hyperlipidemia, unspecified: Secondary | ICD-10-CM | POA: Diagnosis not present

## 2015-09-20 DIAGNOSIS — N289 Disorder of kidney and ureter, unspecified: Secondary | ICD-10-CM

## 2015-09-20 DIAGNOSIS — I10 Essential (primary) hypertension: Secondary | ICD-10-CM | POA: Diagnosis not present

## 2015-09-20 DIAGNOSIS — R600 Localized edema: Secondary | ICD-10-CM | POA: Diagnosis not present

## 2015-09-20 DIAGNOSIS — R609 Edema, unspecified: Secondary | ICD-10-CM

## 2015-09-20 DIAGNOSIS — Z8673 Personal history of transient ischemic attack (TIA), and cerebral infarction without residual deficits: Secondary | ICD-10-CM | POA: Diagnosis not present

## 2015-09-20 DIAGNOSIS — E039 Hypothyroidism, unspecified: Secondary | ICD-10-CM | POA: Diagnosis not present

## 2015-09-20 DIAGNOSIS — I6523 Occlusion and stenosis of bilateral carotid arteries: Secondary | ICD-10-CM

## 2015-09-20 LAB — URINALYSIS, ROUTINE W REFLEX MICROSCOPIC
Bilirubin Urine: NEGATIVE
Ketones, ur: NEGATIVE
LEUKOCYTES UA: NEGATIVE
Nitrite: NEGATIVE
Specific Gravity, Urine: 1.01 (ref 1.000–1.030)
TOTAL PROTEIN, URINE-UPE24: NEGATIVE
Urine Glucose: NEGATIVE
Urobilinogen, UA: 0.2 (ref 0.0–1.0)
pH: 6 (ref 5.0–8.0)

## 2015-09-20 LAB — BASIC METABOLIC PANEL
BUN: 34 mg/dL — ABNORMAL HIGH (ref 6–23)
CALCIUM: 9.4 mg/dL (ref 8.4–10.5)
CO2: 26 meq/L (ref 19–32)
Chloride: 106 mEq/L (ref 96–112)
Creatinine, Ser: 2.6 mg/dL — ABNORMAL HIGH (ref 0.40–1.20)
GFR: 19.12 mL/min — ABNORMAL LOW (ref >60.00)
GLUCOSE: 76 mg/dL (ref 70–99)
Potassium: 4.5 mEq/L (ref 3.5–5.1)
SODIUM: 139 meq/L (ref 135–145)

## 2015-09-20 LAB — TSH: TSH: 8.64 u[IU]/mL — AB (ref 0.35–4.50)

## 2015-09-20 MED ORDER — LEVOTHYROXINE SODIUM 88 MCG PO TABS: 88.0000 ug | ORAL_TABLET | Freq: Every day | ORAL | Status: DC

## 2015-09-20 NOTE — Patient Instructions (Addendum)
blood and urine tests are requested for you today.  We'll let you know about the results. Let's check for blood clots of the legs today.

## 2015-09-20 NOTE — Progress Notes (Signed)
Subjective:    Patient ID: Katrina Yang, female    DOB: January 05, 1941, 75 y.o.   MRN: [id number]  HPI Pt states 2 weeks of moderate swelling of the legs, but no assoc sob.  She has been on her feet a lot over that time, due to her husband's illness.   Past Medical History  Diagnosis Date  . Hypertension   . Palpitations   . Pure hypercholesterolemia   . CVA (cerebral vascular accident) ([place])     hx of  . Secondary hyperparathyroidism (of renal origin)   . Pulmonary nodule   . GERD (gastroesophageal reflux disease)   . Diverticulosis of colon (without mention of hemorrhage)   . Colonic polyp     hx of  . Degenerative joint disease   . Personal history of unspecified urinary disorder   . Osteoporosis, unspecified   . Unspecified hypothyroidism   . History of hysterectomy   . Occipital neuralgia of left side 08/30/2013  . Cervical spondylosis without myelopathy 02/01/2015    Past Surgical History  Procedure Laterality Date  . Back surgery      diskectomy L-5. Multiple back surgeries  . Abdominal hysterectomy      Social History   Social History  . Marital Status: Married    Spouse Name: [name]  . Number of Children: 1  . Years of Education: 2   Occupational History  . Metallurgist    Social History Main Topics  . Smoking status: Never Smoker   . Smokeless tobacco: Never Used  . Alcohol Use: No  . Drug Use: No  . Sexual Activity: Not on file   Other Topics Concern  . Not on file   Social History Narrative   Pt lives at home with her spouse.   Patient drinks 1 glass of tea every other day.   Patient is right handed.    Current Outpatient Prescriptions on File Prior to Visit  Medication Sig Dispense Refill  . amLODipine (NORVASC) 10 MG tablet Take by mouth.    Marland Kitchen aspirin 81 MG tablet Take 1 tablet (81 mg total) by mouth daily.    . calcitRIOL (ROCALTROL) 0.25 MCG capsule TAKE 1 CAPSULE DAILY 90 capsule 2  . clopidogrel (PLAVIX) 75 MG tablet Take 1 tablet (75  mg total) by mouth daily. 90 tablet 2  . gabapentin (NEURONTIN) 100 MG capsule Take 1 capsule (100 mg total) by mouth daily. 90 capsule 0  . metoprolol tartrate (LOPRESSOR) 25 MG tablet Take 1 tablet (25 mg total) by mouth 2 (two) times daily. 180 tablet 3  . rosuvastatin (CRESTOR) 10 MG tablet Take 0.5 tablets (5 mg total) by mouth daily. 90 tablet 3  . carbamazepine (TEGRETOL) 100 MG chewable tablet 1/2 tablet twice a day for 2 weeks and then take 1 tablet twice a day (Patient not taking: Reported on 09/20/2015) 60 tablet 2   No current facility-administered medications on file prior to visit.    Allergies  Allergen Reactions  . Iodinated Diagnostic Agents Swelling    Throat swelling  . Lovastatin   . Metronidazole   . Sulfonamide Derivatives   . Codeine Rash  . Latex Rash    Family History  Problem Relation Age of Onset  . Kidney cancer Father   . Heart attack Father   . Heart disease Father   . Colon cancer Mother   . Ovarian cancer Mother   . Heart disease Mother   . Heart failure Mother   .  Stomach cancer Neg Hx     BP 132/70 mmHg  Pulse 66  Temp(Src) 98.3 F (36.8 C) (Oral)  Ht 5' 1.5" (1.562 m)  Wt 127 lb (57.607 kg)  BMI 23.61 kg/m2  SpO2 97%  Review of Systems Denies polyuria and itching.    Objective:   Physical Exam VITAL SIGNS:  See vs page GENERAL: no distress Ext: 2+ bilat leg edema  Lab Results  Component Value Date   CREATININE 2.60* 09/20/2015   BUN 34* 09/20/2015   NA 139 09/20/2015   K 4.5 09/20/2015   CL 106 09/20/2015   CO2 26 09/20/2015   Venous dopplers of the legs: no DVT    Assessment & Plan:  Edema, new, possibly due to upright posture and norvasc. Renal failure: slightly improved HTN: well-controlled.  Same norvasc for now.  Patient is advised the following: Patient Instructions  blood and urine tests are requested for you today.  We'll let you know about the results. Let's check for blood clots of the legs today.     addendum: Please continue the same medications for now, and please go back to see Dr Felicity Pellegrini soon

## 2015-09-20 NOTE — Telephone Encounter (Signed)
Lab results given to pt.

## 2015-09-24 DIAGNOSIS — L0390 Cellulitis, unspecified: Secondary | ICD-10-CM | POA: Diagnosis not present

## 2015-09-26 DIAGNOSIS — N189 Chronic kidney disease, unspecified: Secondary | ICD-10-CM | POA: Diagnosis not present

## 2015-09-26 DIAGNOSIS — N184 Chronic kidney disease, stage 4 (severe): Secondary | ICD-10-CM | POA: Diagnosis not present

## 2015-09-26 DIAGNOSIS — N2581 Secondary hyperparathyroidism of renal origin: Secondary | ICD-10-CM | POA: Diagnosis not present

## 2015-09-26 DIAGNOSIS — I10 Essential (primary) hypertension: Secondary | ICD-10-CM | POA: Diagnosis not present

## 2015-10-02 DIAGNOSIS — L300 Nummular dermatitis: Secondary | ICD-10-CM | POA: Diagnosis not present

## 2015-10-02 DIAGNOSIS — L820 Inflamed seborrheic keratosis: Secondary | ICD-10-CM | POA: Diagnosis not present

## 2015-10-08 ENCOUNTER — Telehealth: Payer: Self-pay | Admitting: Endocrinology

## 2015-10-08 MED ORDER — METOPROLOL TARTRATE 25 MG PO TABS: 25.0000 mg | ORAL_TABLET | Freq: Two times a day (BID) | ORAL | Status: DC

## 2015-10-08 NOTE — Telephone Encounter (Signed)
Rx submitted per pt's request.  

## 2015-10-08 NOTE — Telephone Encounter (Signed)
[facility] [place] [facility]

## 2015-10-09 DIAGNOSIS — H25813 Combined forms of age-related cataract, bilateral: Secondary | ICD-10-CM | POA: Diagnosis not present

## 2015-10-09 DIAGNOSIS — H524 Presbyopia: Secondary | ICD-10-CM | POA: Diagnosis not present

## 2015-10-15 ENCOUNTER — Telehealth: Payer: Self-pay | Admitting: Endocrinology

## 2015-10-15 NOTE — Telephone Encounter (Signed)
Please d/c 5 days prior

## 2015-10-15 NOTE — Telephone Encounter (Signed)
I contacted the pt and advised of MD's instructions. She voiced understanding.

## 2015-10-15 NOTE — Telephone Encounter (Signed)
See note below and please advise, Thanks! 

## 2015-10-15 NOTE — Telephone Encounter (Signed)
PT needs to know when she needs to go off of the Plavix before she has her knee surgery.  Requests call back.

## 2015-10-22 ENCOUNTER — Telehealth: Payer: Self-pay | Admitting: Neurology

## 2015-10-22 MED ORDER — GABAPENTIN 100 MG PO CAPS: 100.0000 mg | ORAL_CAPSULE | Freq: Every day | ORAL | Status: DC

## 2015-10-22 NOTE — Telephone Encounter (Signed)
Patient called to request refill of gabapentin (NEURONTIN) 100 MG capsule °

## 2015-10-22 NOTE — Telephone Encounter (Signed)
90 day refills retailed on requested med.

## 2015-10-23 DIAGNOSIS — S40922A Unspecified superficial injury of left upper arm, initial encounter: Secondary | ICD-10-CM | POA: Diagnosis not present

## 2015-10-23 DIAGNOSIS — S40862A Insect bite (nonvenomous) of left upper arm, initial encounter: Secondary | ICD-10-CM | POA: Diagnosis not present

## 2015-10-26 DIAGNOSIS — Z1289 Encounter for screening for malignant neoplasm of other sites: Secondary | ICD-10-CM | POA: Diagnosis not present

## 2015-11-01 ENCOUNTER — Ambulatory Visit (INDEPENDENT_AMBULATORY_CARE_PROVIDER_SITE_OTHER): Payer: Medicare Other | Admitting: Internal Medicine

## 2015-11-01 ENCOUNTER — Encounter: Payer: Self-pay | Admitting: Internal Medicine

## 2015-11-01 ENCOUNTER — Ambulatory Visit: Payer: Medicare Other | Admitting: Neurology

## 2015-11-01 VITALS — BP 144/86 | HR 67 | Temp 98.7°F | Resp 14 | Ht 61.5 in | Wt 125.8 lb

## 2015-11-01 DIAGNOSIS — I6523 Occlusion and stenosis of bilateral carotid arteries: Secondary | ICD-10-CM

## 2015-11-01 DIAGNOSIS — N184 Chronic kidney disease, stage 4 (severe): Secondary | ICD-10-CM | POA: Diagnosis not present

## 2015-11-01 DIAGNOSIS — L989 Disorder of the skin and subcutaneous tissue, unspecified: Secondary | ICD-10-CM | POA: Diagnosis not present

## 2015-11-01 DIAGNOSIS — T50905A Adverse effect of unspecified drugs, medicaments and biological substances, initial encounter: Secondary | ICD-10-CM

## 2015-11-01 DIAGNOSIS — T887XXA Unspecified adverse effect of drug or medicament, initial encounter: Secondary | ICD-10-CM | POA: Diagnosis not present

## 2015-11-01 NOTE — Assessment & Plan Note (Signed)
Do not appear infected at this time. She does not describe tick bite or tick on her prior to onset of injury. Does not require doxycycline and will stop medicine due to medication reaction.

## 2015-11-01 NOTE — Progress Notes (Signed)
   Subjective:    Patient ID: Katrina Yang, female    DOB: [birth year], 75 y.o.   MRN: [id number]  HPI The patient is a 75 YO female coming in for several bites. This started several weeks ago and she went to urgent care initially and they thought that it was infected and gave her ciprofloxacin. She took this and they were still not better (about 1 week) and then went to her dermatologist. They looked at the areas and burned one off on her leg. The second spot on her leg healed but a spot on her back had not healed and was still itching and hurting a lot. She went back to urgent care and they looked at it and thought that it was a tick bite and that it could be infected and gave her 2 weeks of doxycycline. She is concerned because she has CKD stage 4 and wants to avoid medicines that are not necessary. She is coming in because she is 1 week into the medicine and it is making her nauseous and sick every times she takes it. She does not feel the wounds are infected and wants a second opinion.   Review of Systems  Constitutional: Negative for fever, activity change, appetite change, fatigue and unexpected weight change.  HENT: Negative.   Eyes: Negative.   Respiratory: Negative for cough and shortness of breath.   Cardiovascular: Negative for chest pain, palpitations and leg swelling.  Gastrointestinal: Negative for abdominal pain, diarrhea, constipation and abdominal distention.  Musculoskeletal: Negative.   Skin: Positive for wound. Negative for color change, pallor and rash.      Objective:   Physical Exam  Constitutional: She is oriented to person, place, and time. She appears well-developed and well-nourished.  HENT:  Head: Normocephalic and atraumatic.  Eyes: EOM are normal.  Neck: Normal range of motion.  Cardiovascular: Normal rate and regular rhythm.   Pulmonary/Chest: Effort normal and breath sounds normal.  Abdominal: Soft.  Musculoskeletal: She exhibits edema.  1+ edema  bilaterally.   Neurological: She is alert and oriented to person, place, and time.  Skin: Skin is warm and dry.  No rash, lesion on the right leg consistent with burn removal, appears to be healing normally without infection, lesion on the upper back which appears to be healed, skin color is darker in color than surrounding skin   Filed Vitals:   11/01/15 0832  BP: 144/86  Pulse: 67  Temp: 98.7 F (37.1 C)  TempSrc: Oral  Resp: 14  Height: 5' 1.5" (1.562 m)  Weight: 125 lb 12.8 oz (57.063 kg)  SpO2: 98%      Assessment & Plan:

## 2015-11-01 NOTE — Progress Notes (Signed)
Pre visit review using our clinic review tool, if applicable. No additional management support is needed unless otherwise documented below in the visit note. 

## 2015-11-01 NOTE — Assessment & Plan Note (Signed)
Unclear what dose of cipro she was on but that could have been too high with her kidney function. Doxycycline does not require dosing change with her kidney function.

## 2015-11-01 NOTE — Assessment & Plan Note (Signed)
Expected side effect of doxycycline with nausea and decreased appetite. Will stop as there is no indication for the antibiotic at this time. Could be treated with doxycyline in the past with anti-emetic.

## 2015-11-01 NOTE — Patient Instructions (Signed)
It is okay to stop taking the doxycycline and those spots are not infected appearing.   It is okay for you to get the surgery in July as planned.

## 2015-11-02 ENCOUNTER — Telehealth: Payer: Self-pay | Admitting: Cardiology

## 2015-11-02 NOTE — Telephone Encounter (Signed)
Patient c/o Palpitations:  High priority if patient c/o lightheadedness and shortness of breath.  1. How long have you been having palpitations? Pt states Off and on of about a month  2. Are you currently experiencing lightheadedness and shortness of breath? no  3. Have you checked your BP and heart rate? (document readings) last reading 156/84  4. Are you experiencing any other symptoms? Dizziness   Comment: pt wanted to speak with RN about scheduling a soon appt with physician. Put is having knee surgery on 7/12 and wants to be seen sooner. Please call back to advise

## 2015-11-02 NOTE — Telephone Encounter (Signed)
Pt states she has had more frequent palpitations recently. Pt denies palpitations or other symptoms at this time.  Pt states she is scheduled to have arthroscopic knee surgery 11/14/15 and is requesting a check up before surgery.  Pt advised surgeon should contact our office directly if they are requesting cardiac clearance.

## 2015-11-03 ENCOUNTER — Encounter: Payer: Self-pay | Admitting: Physician Assistant

## 2015-11-03 NOTE — Progress Notes (Addendum)
Cardiology Office Note    Date:  11/05/2015  ID:  Katrina Yang, DOB 07-05-1940, MRN [id number] PCP:  Renato Shin, MD  Cardiologist:  Aundra Dubin   Chief Complaint: palpitations  History of Present Illness:  Katrina Yang is a 75 y.o. female with history of labile HTN (renal dopplers 2012 without evidence for RAS), hypothyroidism, CVA (on ASA/Plavix), CKD stage IV, secondary hyperparathyroidism, HLD, GERD, L carotid bruit (Carotid duplex 2017 with mild BICA stenosis), peripheral neuropathy who presents for evaluation of palpitations. 2D echo 08/2013: EF 55-60%, grade 1 DD, borderline elevated LV filling pressure. Last labs from 09/2015 showed Cr 2.6, TSH 8.6. Prior CBC from 03/2015 showed Hgb 11.1. She has previously followed with Dr. Aundra Dubin for "weak spells" of uncertain etiology. No significant arrhythmia on cardiac monitor in 8/14 (though she only wore it for a few days). She had a lot of trouble with the event monitor in 8/14 and had declined to wear one again at that time.  She presents back for clinic with complaints of increased frequency of palpitations for the last 2 months. She describes a sensation of fast fluttering that is happening every other day or so. She sometimes has to take a deep breath or push a pillow to her chest to get it to resolve. She has not had any chest pain, dyspnea, presyncope, syncope, or LEE.   Past Medical History  Diagnosis Date  . Hypertension     a. Renal artery dopplers in 2012 with no evidence for significant renal artery stenosis.   . Palpitations     a. Event monitor (8/14) with no significant abnormalities.   . Pure hypercholesterolemia   . CVA (cerebral vascular accident) ([place])     hx of  . Secondary hyperparathyroidism (of renal origin)   . Pulmonary nodule   . GERD (gastroesophageal reflux disease)   . Diverticulosis of colon (without mention of hemorrhage)   . Colonic polyp     hx of  . Degenerative joint disease   . Personal history of  unspecified urinary disorder   . Osteoporosis, unspecified   . Unspecified hypothyroidism   . History of hysterectomy   . Occipital neuralgia of left side 08/30/2013  . Cervical spondylosis without myelopathy 02/01/2015  . Carotid bruit     a.  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  . CKD (chronic kidney disease), stage IV ([place])   . Peripheral neuropathic pain [facility])     Past Surgical History  Procedure Laterality Date  . Back surgery      diskectomy L-5. Multiple back surgeries  . Abdominal hysterectomy      Current Medications: Current Outpatient Prescriptions  Medication Sig Dispense Refill  . amLODipine (NORVASC) 10 MG tablet Take 10 mg by mouth daily.     Marland Kitchen aspirin 81 MG tablet Take 1 tablet (81 mg total) by mouth daily.    . calcitRIOL (ROCALTROL) 0.25 MCG capsule TAKE 1 CAPSULE DAILY 90 capsule 2  . clopidogrel (PLAVIX) 75 MG tablet Take 1 tablet (75 mg total) by mouth daily. 90 tablet 2  . furosemide (LASIX) 40 MG tablet Take 40 mg by mouth daily as needed for edema.    . gabapentin (NEURONTIN) 100 MG capsule Take 1 capsule (100 mg total) by mouth daily. 90 capsule 3  . levothyroxine (SYNTHROID, LEVOTHROID) 88 MCG tablet Take 1 tablet (88 mcg total) by mouth daily before breakfast. 90 tablet 3  . metoprolol tartrate (LOPRESSOR) 25 MG tablet Take 1 tablet (25 mg  total) by mouth 2 (two) times daily. 180 tablet 3  . rosuvastatin (CRESTOR) 10 MG tablet Take 0.5 tablets (5 mg total) by mouth daily. 90 tablet 3   No current facility-administered medications for this visit.     Allergies:   Iodinated diagnostic agents; Lovastatin; Metronidazole; Sulfonamide derivatives; Codeine; and Latex   Social History   Social History  . Marital Status: Married    Spouse Name: [name]  . Number of Children: 1  . Years of Education: 2   Occupational History  . Metallurgist    Social History Main Topics  . Smoking status: Never Smoker   . Smokeless tobacco: Never Used  . Alcohol Use: No   . Drug Use: No  . Sexual Activity: Not Asked   Other Topics Concern  . None   Social History Narrative   Pt lives at home with her spouse.   Patient drinks 1 glass of tea every other day.   Patient is right handed.     Family History:  The patient's family history includes Colon cancer in her mother; Heart attack in her father; Heart disease in her father and mother; Heart failure in her mother; Kidney cancer in her father; Ovarian cancer in her mother. There is no history of Stomach cancer.   ROS:   Please see the history of present illness.  All other systems are reviewed and otherwise negative.    PHYSICAL EXAM:   VS:  BP 122/82 mmHg  Pulse 65  Ht 5' 1.5" (1.562 m)  Wt 123 lb 9.6 oz (56.065 kg)  BMI 22.98 kg/m2  SpO2 98%  BMI: Body mass index is 22.98 kg/(m^2). GEN: Well nourished, well developed WF, in no acute distress HEENT: normocephalic, atraumatic Neck: no JVD, carotid bruits, or masses Cardiac: RRR one ectopic beat heard; no murmurs, rubs, or gallops, no edema  Respiratory:  clear to auscultation bilaterally, normal work of breathing GI: soft, nontender, nondistended, + BS MS: no deformity or atrophy Skin: warm and dry, no rash Neuro:  Alert and Oriented x 3, Strength and sensation are intact, follows commands Psych: euthymic mood, full affect  Wt Readings from Last 3 Encounters:  11/05/15 123 lb 9.6 oz (56.065 kg)  11/01/15 125 lb 12.8 oz (57.063 kg)  09/20/15 127 lb (57.607 kg)      Studies/Labs Reviewed:   EKG:  EKG was ordered today and personally reviewed by me and demonstrates NSR 65bpm, no acute ST-T changes, QTc 453ms, no ectopy  Recent Labs: 02/27/2015: ALT 14; Hemoglobin 11.1*; Platelets 288.0 09/20/2015: BUN 34*; Creatinine, Ser 2.60*; Potassium 4.5; Sodium 139; TSH 8.64*   Lipid Panel    Component Value Date/Time   CHOL 212* 02/27/2015 0829   TRIG 75.0 02/27/2015 0829   HDL 90.60 02/27/2015 0829   CHOLHDL 2 02/27/2015 0829   VLDL 15.0  02/27/2015 0829   LDLCALC 106* 02/27/2015 0829   LDLDIRECT 107.3 02/14/2013 0843    Additional studies/ records that were reviewed today include: Summarized above.    ASSESSMENT & PLAN:   1. Palpitations - the patient was initially resistant to the idea of a monitor ("I just don't do well with them") but I explained to her that this is the only way we can really diagnose the arrhythmia besides a loop recorder. I told her I thought it would be important to diagnose the arrhythmia particularly given her history of stroke - if the palpitations are found to represent atrial fib, it will change management of her  anticoagulation. She is agreeable to trying one again. Hopefully we can capture her rhythm this time. She is anticipating arthroscopic knee surgery soon and I did tell her I thought it would be helpful to have the monitor information prior to this but she has elected to defer the monitor for several weeks on her own accord. She denies recent anginal or CHF sx. Of note her TSH was recently abnormal 5/18 - she states her PCP adjusted her thyroid medication and has since rechecked it in June and is keeping a close eye on it. Potassium in May 2017 was normal.  2. Labile HTN - controlled. Continue current regimen. 3. CKD stage IV - follows with [facility]. 4. Hyperlipidemia - has not tolerated Mevacor in the past per notes. Continue Crestor. 5. Carotid artery disease - stable 1-39% stenosis by duplex earlier this year. Further f/u at discretion of primary cardiologist.  Disposition: F/u with Dr. Aundra Dubin per recall this fall.  Medication Adjustments/Labs and Tests Ordered: Current medicines are reviewed at length with the patient today.  Concerns regarding medicines are outlined above. Medication changes, Labs and Tests ordered today are summarized above and listed in the Patient Instructions accessible in Encounters.   Raechel Ache PA-C  11/05/2015 9:24 AM    [address], [place], [place]  [address] Phone: [phone]; Fax: [phone]

## 2015-11-05 ENCOUNTER — Telehealth: Payer: Self-pay | Admitting: Cardiology

## 2015-11-05 ENCOUNTER — Ambulatory Visit (INDEPENDENT_AMBULATORY_CARE_PROVIDER_SITE_OTHER): Payer: Medicare Other | Admitting: Physician Assistant

## 2015-11-05 ENCOUNTER — Encounter: Payer: Self-pay | Admitting: Physician Assistant

## 2015-11-05 VITALS — BP 122/82 | HR 65 | Ht 61.5 in | Wt 123.6 lb

## 2015-11-05 DIAGNOSIS — E785 Hyperlipidemia, unspecified: Secondary | ICD-10-CM | POA: Diagnosis not present

## 2015-11-05 DIAGNOSIS — I779 Disorder of arteries and arterioles, unspecified: Secondary | ICD-10-CM | POA: Insufficient documentation

## 2015-11-05 DIAGNOSIS — R002 Palpitations: Secondary | ICD-10-CM | POA: Diagnosis not present

## 2015-11-05 DIAGNOSIS — I6523 Occlusion and stenosis of bilateral carotid arteries: Secondary | ICD-10-CM

## 2015-11-05 DIAGNOSIS — I739 Peripheral vascular disease, unspecified: Secondary | ICD-10-CM

## 2015-11-05 DIAGNOSIS — I10 Essential (primary) hypertension: Secondary | ICD-10-CM

## 2015-11-05 DIAGNOSIS — N184 Chronic kidney disease, stage 4 (severe): Secondary | ICD-10-CM

## 2015-11-05 NOTE — Patient Instructions (Signed)
Medication Instructions:  Your physician recommends that you continue on your current medications as directed. Please refer to the Current Medication list given to you today.   Labwork: -None  Testing/Procedures: Your physician has recommended that you wear an event monitor. Event monitors are medical devices that record the heart's electrical activity. Doctors most often [place] these monitors to diagnose arrhythmias. Arrhythmias are problems with the speed or rhythm of the heartbeat. The monitor is a small, portable device. You can wear one while you do your normal daily activities. This is usually used to diagnose what is causing palpitations/syncope (passing out).     Follow-Up: Your physician wants you to follow-up in: 3 months with Dr. Aundra Dubin.  You will receive a reminder letter in the mail two months in advance. If you don't receive a letter, please call our office to schedule the follow-up appointment.    Any Other Special Instructions Will Be Listed Below (If Applicable).     If you need a refill on your cardiac medications before your next appointment, please call your pharmacy.

## 2015-11-05 NOTE — Telephone Encounter (Signed)
No note needed 

## 2015-11-12 ENCOUNTER — Telehealth: Payer: Self-pay | Admitting: Physician Assistant

## 2015-11-12 NOTE — Telephone Encounter (Signed)
-----   Message from Charlie Pitter, [place] sent at 11/10/2015  7:08 AM EDT ----- Regarding: RE: Event monitor Please document this in a telephone note so there is a paper trail. Thanks!  ----- Message -----    From: Jeanie Sewer    Sent: 11/08/2015  10:14 AM      To: Charlie Pitter, PA-C Subject: Event monitor                                  I called pt today and spoke with her about the event monitor she advised that she is not gonna get the monitor.   stpegram

## 2015-11-12 NOTE — Telephone Encounter (Deleted)
Spoke with pt on 11/08/2015 Pt has decided no to have the event monitor done. I have forwarded this info to [employer].

## 2015-11-14 DIAGNOSIS — M23304 Other meniscus derangements, unspecified medial meniscus, left knee: Secondary | ICD-10-CM | POA: Diagnosis not present

## 2015-11-14 DIAGNOSIS — G8918 Other acute postprocedural pain: Secondary | ICD-10-CM | POA: Diagnosis not present

## 2015-11-14 DIAGNOSIS — M23262 Derangement of other lateral meniscus due to old tear or injury, left knee: Secondary | ICD-10-CM | POA: Diagnosis not present

## 2015-11-14 DIAGNOSIS — M23232 Derangement of other medial meniscus due to old tear or injury, left knee: Secondary | ICD-10-CM | POA: Diagnosis not present

## 2015-11-14 DIAGNOSIS — M23301 Other meniscus derangements, unspecified lateral meniscus, left knee: Secondary | ICD-10-CM | POA: Diagnosis not present

## 2015-11-14 DIAGNOSIS — M1712 Unilateral primary osteoarthritis, left knee: Secondary | ICD-10-CM | POA: Diagnosis not present

## 2015-11-14 DIAGNOSIS — M94262 Chondromalacia, left knee: Secondary | ICD-10-CM | POA: Diagnosis not present

## 2015-11-14 HISTORY — PX: MENISCECTOMY: SHX123

## 2015-11-16 ENCOUNTER — Telehealth: Payer: Self-pay | Admitting: Gastroenterology

## 2015-11-16 NOTE — Telephone Encounter (Signed)
Spoke with the patient. She denies any further blood with her stool. She saw blood after straining to move her bowels. She is constipated. She is not on any anticoagulation except ASA 81 mg. She feels well. She is "recovering nicely" from her knee arthoscopy. Last colonoscopy 05/10/2013. Patient states she has hemorrhoids. She will use a stool softener and increase her fluids. She is to call back if she sees blood again or has new symptoms.

## 2015-11-19 ENCOUNTER — Telehealth: Payer: Self-pay | Admitting: Gastroenterology

## 2015-11-19 NOTE — Telephone Encounter (Signed)
Left message to call back  

## 2015-11-19 NOTE — Telephone Encounter (Signed)
Left a message to offer an appointment to be evaluated.

## 2015-11-19 NOTE — Telephone Encounter (Signed)
Spoke with the patient. She states I have been calling the wrong number. She rarely uses her house phone.  Continues to have blood with her stool. She is not constipated. Taking stool softeners. Denies any pain. Unable to come in on Wednesday, which is the first available appointment. Declines to go to her PCP. Worked in appointment on Friday at 1:30 pm with her primary GI.

## 2015-11-23 ENCOUNTER — Other Ambulatory Visit (INDEPENDENT_AMBULATORY_CARE_PROVIDER_SITE_OTHER): Payer: Medicare Other

## 2015-11-23 ENCOUNTER — Ambulatory Visit (INDEPENDENT_AMBULATORY_CARE_PROVIDER_SITE_OTHER): Payer: Medicare Other | Admitting: Gastroenterology

## 2015-11-23 ENCOUNTER — Encounter: Payer: Self-pay | Admitting: Gastroenterology

## 2015-11-23 VITALS — BP 120/72 | HR 72 | Ht 61.0 in | Wt 123.4 lb

## 2015-11-23 DIAGNOSIS — K625 Hemorrhage of anus and rectum: Secondary | ICD-10-CM

## 2015-11-23 DIAGNOSIS — I6523 Occlusion and stenosis of bilateral carotid arteries: Secondary | ICD-10-CM | POA: Diagnosis not present

## 2015-11-23 DIAGNOSIS — K649 Unspecified hemorrhoids: Secondary | ICD-10-CM

## 2015-11-23 LAB — CBC WITH DIFFERENTIAL/PLATELET
BASOS ABS: 0 10*3/uL (ref 0.0–0.1)
Basophils Relative: 0.2 % (ref 0.0–3.0)
Eosinophils Absolute: 0.2 10*3/uL (ref 0.0–0.7)
Eosinophils Relative: 2.1 % (ref 0.0–5.0)
HEMATOCRIT: 32.6 % — AB (ref 36.0–46.0)
HEMOGLOBIN: 10.7 g/dL — AB (ref 12.0–15.0)
LYMPHS PCT: 23.6 % (ref 12.0–46.0)
Lymphs Abs: 2.1 10*3/uL (ref 0.7–4.0)
MCHC: 32.9 g/dL (ref 30.0–36.0)
MCV: 88.3 fl (ref 78.0–100.0)
MONOS PCT: 10.6 % (ref 3.0–12.0)
Monocytes Absolute: 0.9 10*3/uL (ref 0.1–1.0)
NEUTROS ABS: 5.6 10*3/uL (ref 1.4–7.7)
Neutrophils Relative %: 63.5 % (ref 43.0–77.0)
PLATELETS: 345 10*3/uL (ref 150.0–400.0)
RBC: 3.69 Mil/uL — ABNORMAL LOW (ref 3.87–5.11)
RDW: 13.5 % (ref 11.5–15.5)
WBC: 8.9 10*3/uL (ref 4.0–10.5)

## 2015-11-23 MED ORDER — HYDROCORTISONE ACETATE 25 MG RE SUPP: 25.0000 mg | Freq: Every day | RECTAL | Status: DC

## 2015-11-23 NOTE — Patient Instructions (Signed)
Your physician has requested that you go to the basement for the following lab work before leaving today: CBC  We have sent the following medications to your pharmacy for you to pick up at your convenience: Anusol suppositories  Please purchase the following medications over the counter and take as directed: Citrucel daily  Call our office next week if your rectal bleeding has not improved.  If you are age 74 or older, your body mass index should be between 23-30. Your Body mass index is 23.32 kg/(m^2). If this is out of the aforementioned range listed, please consider follow up with your Primary Care Provider.  If you are age 39 or younger, your body mass index should be between 19-25. Your Body mass index is 23.32 kg/(m^2). If this is out of the aformentioned range listed, please consider follow up with your Primary Care Provider.

## 2015-11-23 NOTE — Progress Notes (Signed)
HPI :  75 y/o female former patient of Dr. Deatra Ina, here for complaints of rectal bleeding.   She reports some rectal bleeding for about a week and a half. She has red blood in her stools. She has roughly one BM per day, sometimes 2. She reports most stools have had blood in it since when it first started. She denies any anal pain or abdominal pain associated with these symptoms. She has had some hard stools and has some straining since symptoms started.  She has tried using a stool softener but not sure if it has helped. She reports a history of hemorrhoidal bleeding, she has had them with only rare bleeding in the past and not recently or like this previously. Mother had colon cancer cancer in her last 23s and she is quite concerned about this possibility.   Her last colonoscopy was in 05/2013, diminutive polyp adenoma removed, otherwise normal other than diverticulosis.   She has been on Plavix for history of CVA in 2003 and have had 4 TIAs in the past as well, the last in 2007. She has a history of a mild normocytic anemia with normal iron levels in the past.    Past Medical History  Diagnosis Date  . Hypertension     a. Renal artery dopplers in 2012 with no evidence for significant renal artery stenosis.   . Palpitations     a. Event monitor (8/14) with no significant abnormalities.   . Pure hypercholesterolemia   . CVA (cerebral vascular accident) ([place])     hx of  . Secondary hyperparathyroidism (of renal origin)   . Pulmonary nodule   . GERD (gastroesophageal reflux disease)   . Diverticulosis of colon (without mention of hemorrhage)   . Colonic polyp     hx of  . Degenerative joint disease   . Personal history of unspecified urinary disorder   . Osteoporosis, unspecified   . Unspecified hypothyroidism   . History of hysterectomy   . Occipital neuralgia of left side 08/30/2013  . Cervical spondylosis without myelopathy 02/01/2015  . Carotid bruit     a.  Duplex 08/2015: stable  1-39% stenosis.  . CKD (chronic kidney disease), stage IV ([place])   . Peripheral neuropathic pain [facility])      Past Surgical History  Procedure Laterality Date  . Back surgery      diskectomy L-5. Multiple back surgeries  . Abdominal hysterectomy    . Meniscectomy Left 11/14/15   Family History  Problem Relation Age of Onset  . Kidney cancer Father   . Heart attack Father   . Heart disease Father   . Colon cancer Mother   . Ovarian cancer Mother   . Heart disease Mother   . Heart failure Mother   . Stomach cancer Neg Hx    Social History  Substance Use Topics  . Smoking status: Never Smoker   . Smokeless tobacco: Never Used  . Alcohol Use: No   Current Outpatient Prescriptions  Medication Sig Dispense Refill  . amLODipine (NORVASC) 10 MG tablet Take 10 mg by mouth daily.     Marland Kitchen aspirin 81 MG tablet Take 1 tablet (81 mg total) by mouth daily.    . calcitRIOL (ROCALTROL) 0.25 MCG capsule TAKE 1 CAPSULE DAILY 90 capsule 2  . clopidogrel (PLAVIX) 75 MG tablet Take 1 tablet (75 mg total) by mouth daily. 90 tablet 2  . gabapentin (NEURONTIN) 100 MG capsule Take 1 capsule (100 mg total) by mouth  daily. 90 capsule 3  . levothyroxine (SYNTHROID, LEVOTHROID) 88 MCG tablet Take 1 tablet (88 mcg total) by mouth daily before breakfast. 90 tablet 3  . metoprolol tartrate (LOPRESSOR) 25 MG tablet Take 1 tablet (25 mg total) by mouth 2 (two) times daily. 180 tablet 3  . rosuvastatin (CRESTOR) 10 MG tablet Take 0.5 tablets (5 mg total) by mouth daily. 90 tablet 3   No current facility-administered medications for this visit.   Allergies  Allergen Reactions  . Iodinated Diagnostic Agents Swelling    Throat swelling  . Lovastatin     unknown  . Metronidazole     unknown  . Sulfonamide Derivatives     unknown  . Codeine Rash  . Latex Rash     Review of Systems: All systems reviewed and negative except where noted in HPI.    CBC Latest Ref Rng 11/23/2015 02/27/2015 01/16/2014  WBC  4.0 - 10.5 K/uL 8.9 6.2 6.3  Hemoglobin 12.0 - 15.0 g/dL 10.7(L) 11.1(L) 10.9(L)  Hematocrit 36.0 - 46.0 % 32.6(L) 33.1(L) 33.3(L)  Platelets 150.0 - 400.0 K/uL 345.0 288.0 241.0    Lab Results  Component Value Date   IRON 65 02/27/2015    Lab Results  Component Value Date   CREATININE 2.60* 09/20/2015   BUN 34* 09/20/2015   NA 139 09/20/2015   K 4.5 09/20/2015   CL 106 09/20/2015   CO2 26 09/20/2015    Lab Results  Component Value Date   ALT 14 02/27/2015   AST 22 02/27/2015   ALKPHOS 61 02/27/2015   BILITOT 0.4 02/27/2015   Lab Results  Component Value Date   WBC 8.9 11/23/2015   HGB 10.7* 11/23/2015   HCT 32.6* 11/23/2015   MCV 88.3 11/23/2015   PLT 345.0 11/23/2015     Physical Exam: BP 120/72 mmHg  Pulse 72  Ht 5\' 1"  (1.549 m)  Wt 123 lb 6 oz (55.963 kg)  BMI 23.32 kg/m2 Constitutional: Pleasant, female in no acute distress. HEENT: Normocephalic and atraumatic. Conjunctivae are normal. No scleral icterus. Neck supple.  Cardiovascular: Normal rate, regular rhythm.  Pulmonary/chest: Effort normal and breath sounds normal. No wheezing, rales or rhonchi. Abdominal: Soft, nondistended, nontender. Bowel sounds active throughout. There are no masses palpable. No hepatomegaly. DRE / Anoscopy - internal hemorrhoids in all positions, no fissure, no mass lesion appreciated. RN present as standby Extremities: no edema Lymphadenopathy: No cervical adenopathy noted. Neurological: Alert and oriented to person place and time. Skin: Skin is warm and dry. No rashes noted. Psychiatric: Normal mood and affect. Behavior is normal.   ASSESSMENT AND PLAN: 75 y/o female with medical history as outlined above, presenting with rectal bleeding in the setting of constipation over the last 2 weeks. CBC obtained today which shows stable Hgb compared to previous values - she has a stable mild normocytic anemia, with which she has had normal iron levels in the past. Her last  colonoscopy was 2 years ago, making the interval development of bleeding polyp / cancer unlikely, and reassured her of this, which was her main concern. Anoscopy shows internal hemorrhoids in all positions which I suspect are the most likely cause of her symptoms in the setting of constipation. I recommend she take a fiber supplement daily and use hydrocortizone suppository and see if this helps. She needs to minimize straining on the toilet. I discussed the possibility of hemorrhoid banding with her for more definitive treatment. I spoke with Dr. Edmonia Caprio, representative of [place] hemorrhoid bander, about  banding on Plavix. He reports this can be done in certain circumstances, and while there is not a lot of data regarding bleeding risk of banding on Plavix, the overall risk of significant bleeding is thought to remain fairly low. In this light, if conservative therapy does not improve her symptoms, we may consider banding on Plavix, given her strong history of CVA/TIAs. Otherwise, if her symptoms worsen or fail to improve with hemorrhoid therapy, we may consider endoscopic evaluation to confirm the etiology. I asked her to call us back next week if no improvement. She agreed with the plan.   [place]leasanton Cellar, MD [facility] Gastroenterology Pager [phone]

## 2015-11-26 DIAGNOSIS — M25562 Pain in left knee: Secondary | ICD-10-CM | POA: Diagnosis not present

## 2015-11-27 ENCOUNTER — Telehealth: Payer: Self-pay | Admitting: Gastroenterology

## 2015-11-27 NOTE — Telephone Encounter (Signed)
Spoke with patient and she reports she is using Anusol HC suppositories nightly and Citrucel daily. Reports BM daily and no bleeding since Sunday. Wants to know how long to continue medications.

## 2015-11-27 NOTE — Telephone Encounter (Signed)
Patient notified of recommendations. 

## 2015-11-27 NOTE — Telephone Encounter (Signed)
Left a message for patient to call back. 

## 2015-11-27 NOTE — Telephone Encounter (Signed)
She should continue the Citrucel indefinitely for now. I would continue Anusol for a week or so, and then PRN. Hopefully this resolves her symptoms. If it does not and it recurs and persists, she should contact me for consideration for banding as I previously discussed with her. Thanks

## 2015-12-04 DIAGNOSIS — N2581 Secondary hyperparathyroidism of renal origin: Secondary | ICD-10-CM | POA: Diagnosis not present

## 2015-12-04 DIAGNOSIS — D631 Anemia in chronic kidney disease: Secondary | ICD-10-CM | POA: Diagnosis not present

## 2015-12-04 DIAGNOSIS — M1712 Unilateral primary osteoarthritis, left knee: Secondary | ICD-10-CM | POA: Diagnosis not present

## 2015-12-04 DIAGNOSIS — N189 Chronic kidney disease, unspecified: Secondary | ICD-10-CM | POA: Diagnosis not present

## 2015-12-04 DIAGNOSIS — N184 Chronic kidney disease, stage 4 (severe): Secondary | ICD-10-CM | POA: Diagnosis not present

## 2015-12-17 DIAGNOSIS — L820 Inflamed seborrheic keratosis: Secondary | ICD-10-CM | POA: Diagnosis not present

## 2015-12-17 DIAGNOSIS — R233 Spontaneous ecchymoses: Secondary | ICD-10-CM | POA: Diagnosis not present

## 2015-12-17 DIAGNOSIS — L821 Other seborrheic keratosis: Secondary | ICD-10-CM | POA: Diagnosis not present

## 2015-12-17 DIAGNOSIS — L578 Other skin changes due to chronic exposure to nonionizing radiation: Secondary | ICD-10-CM | POA: Diagnosis not present

## 2015-12-25 ENCOUNTER — Ambulatory Visit: Payer: Medicare Other | Admitting: Endocrinology

## 2016-01-04 ENCOUNTER — Encounter: Payer: Self-pay | Admitting: Endocrinology

## 2016-01-04 ENCOUNTER — Ambulatory Visit: Payer: Medicare Other | Admitting: Endocrinology

## 2016-01-04 DIAGNOSIS — Z23 Encounter for immunization: Secondary | ICD-10-CM

## 2016-01-04 DIAGNOSIS — Z1231 Encounter for screening mammogram for malignant neoplasm of breast: Secondary | ICD-10-CM | POA: Diagnosis not present

## 2016-01-10 DIAGNOSIS — M170 Bilateral primary osteoarthritis of knee: Secondary | ICD-10-CM | POA: Diagnosis not present

## 2016-01-10 DIAGNOSIS — M25562 Pain in left knee: Secondary | ICD-10-CM | POA: Diagnosis not present

## 2016-01-10 DIAGNOSIS — R262 Difficulty in walking, not elsewhere classified: Secondary | ICD-10-CM | POA: Diagnosis not present

## 2016-01-10 DIAGNOSIS — M1712 Unilateral primary osteoarthritis, left knee: Secondary | ICD-10-CM | POA: Diagnosis not present

## 2016-01-14 DIAGNOSIS — M1712 Unilateral primary osteoarthritis, left knee: Secondary | ICD-10-CM | POA: Diagnosis not present

## 2016-01-14 DIAGNOSIS — M25562 Pain in left knee: Secondary | ICD-10-CM | POA: Diagnosis not present

## 2016-01-23 DIAGNOSIS — M1712 Unilateral primary osteoarthritis, left knee: Secondary | ICD-10-CM | POA: Diagnosis not present

## 2016-01-23 DIAGNOSIS — M25562 Pain in left knee: Secondary | ICD-10-CM | POA: Diagnosis not present

## 2016-01-29 DIAGNOSIS — M1712 Unilateral primary osteoarthritis, left knee: Secondary | ICD-10-CM | POA: Diagnosis not present

## 2016-01-29 DIAGNOSIS — M25562 Pain in left knee: Secondary | ICD-10-CM | POA: Diagnosis not present

## 2016-02-06 DIAGNOSIS — M1712 Unilateral primary osteoarthritis, left knee: Secondary | ICD-10-CM | POA: Diagnosis not present

## 2016-02-06 DIAGNOSIS — M25562 Pain in left knee: Secondary | ICD-10-CM | POA: Diagnosis not present

## 2016-02-14 ENCOUNTER — Ambulatory Visit (INDEPENDENT_AMBULATORY_CARE_PROVIDER_SITE_OTHER): Payer: Medicare Other | Admitting: Neurology

## 2016-02-14 ENCOUNTER — Encounter: Payer: Self-pay | Admitting: Neurology

## 2016-02-14 VITALS — BP 151/77 | HR 72 | Ht 61.0 in | Wt 123.0 lb

## 2016-02-14 DIAGNOSIS — M961 Postlaminectomy syndrome, not elsewhere classified: Secondary | ICD-10-CM

## 2016-02-14 DIAGNOSIS — I6523 Occlusion and stenosis of bilateral carotid arteries: Secondary | ICD-10-CM | POA: Diagnosis not present

## 2016-02-14 DIAGNOSIS — M5481 Occipital neuralgia: Secondary | ICD-10-CM | POA: Diagnosis not present

## 2016-02-14 DIAGNOSIS — M47812 Spondylosis without myelopathy or radiculopathy, cervical region: Secondary | ICD-10-CM

## 2016-02-14 NOTE — Progress Notes (Signed)
Reason for visit: Left occipital neuralgia  Katrina Yang is an 75 y.o. female  History of present illness:  Katrina Yang is a 75 year old right-handed white female with a history of ongoing chronic pain issues associated with neck discomfort, left occipital pain, pain associated with degenerative arthritis of the left knee, left back pain and left-sided sciatica following lumbosacral spine surgery. The patient was given a trial on carbamazepine without any benefit, she went off of the medication. She remains on low-dose gabapentin taking 100 mg daily. She has chronic renal insufficiency with a GFR of around 20. The patient has received an epidural steroid injection about one year ago with some benefit. She returns for an evaluation. The left knee problem limits her ability to ambulate long distances.  Past Medical History:  Diagnosis Date  . Carotid bruit    a.  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  . Cervical spondylosis without myelopathy 02/01/2015  . CKD (chronic kidney disease), stage IV ([place])   . Colonic polyp    hx of  . CVA (cerebral vascular accident) ([place])    hx of  . Degenerative joint disease   . Diverticulosis of colon (without mention of hemorrhage)   . GERD (gastroesophageal reflux disease)   . History of hysterectomy   . Hypertension    a. Renal artery dopplers in 2012 with no evidence for significant renal artery stenosis.   . Occipital neuralgia of left side 08/30/2013  . Osteoporosis, unspecified   . Palpitations    a. Event monitor (8/14) with no significant abnormalities.   . Peripheral neuropathic pain   . Personal history of unspecified urinary disorder   . Pulmonary nodule   . Pure hypercholesterolemia   . Secondary hyperparathyroidism (of renal origin)   . Unspecified hypothyroidism     Past Surgical History:  Procedure Laterality Date  . ABDOMINAL HYSTERECTOMY    . BACK SURGERY     diskectomy L-5. Multiple back surgeries  . MENISCECTOMY Left  11/14/15    Family History  Problem Relation Age of Onset  . Kidney cancer Father   . Heart attack Father   . Heart disease Father   . Colon cancer Mother   . Ovarian cancer Mother   . Heart disease Mother   . Heart failure Mother   . Stomach cancer Neg Hx     Social history:  reports that she has never smoked. She has never used smokeless tobacco. She reports that she does not drink alcohol or use drugs.    Allergies  Allergen Reactions  . Iodinated Diagnostic Agents Swelling    Throat swelling  . Lovastatin     unknown  . Metronidazole     unknown  . Sulfonamide Derivatives     unknown  . Codeine Rash  . Latex Rash    Medications:  Prior to Admission medications   Medication Sig Start Date End Date Taking? Authorizing Provider  amLODipine (NORVASC) 10 MG tablet Take 10 mg by mouth daily.  06/19/15  Yes Historical Provider, MD  aspirin 81 MG tablet Take 1 tablet (81 mg total) by mouth daily. 12/28/12  Yes Larey Dresser, MD  calcitRIOL (ROCALTROL) 0.25 MCG capsule TAKE 1 CAPSULE DAILY 07/10/15  Yes Renato Shin, MD  clopidogrel (PLAVIX) 75 MG tablet Take 1 tablet (75 mg total) by mouth daily. 08/17/15  Yes Kathrynn Ducking, MD  gabapentin (NEURONTIN) 100 MG capsule Take 1 capsule (100 mg total) by mouth daily. 10/22/15  Yes Izella Ybanez  Loreta Ave, MD  levothyroxine (SYNTHROID, LEVOTHROID) 88 MCG tablet Take 1 tablet (88 mcg total) by mouth daily before breakfast. 09/20/15  Yes Renato Shin, MD  metoprolol tartrate (LOPRESSOR) 25 MG tablet Take 1 tablet (25 mg total) by mouth 2 (two) times daily. 10/08/15  Yes Renato Shin, MD  rosuvastatin (CRESTOR) 10 MG tablet Take 0.5 tablets (5 mg total) by mouth daily. 08/14/15  Yes Renato Shin, MD    ROS:  Out of a complete 14 system review of symptoms, the patient complains only of the following symptoms, and all other reviewed systems are negative.  Joint pain, back pain, achy muscles, walking difficulty  Blood pressure (!) 151/77, pulse  72, height 5\' 1"  (1.549 m), weight 123 lb (55.8 kg).  Physical Exam  General: The patient is alert and cooperative at the time of the examination.  Skin: No significant peripheral edema is noted.   Neurologic Exam  Mental status: The patient is alert and oriented x 3 at the time of the examination. The patient has apparent normal recent and remote memory, with an apparently normal attention span and concentration ability.   Cranial nerves: Facial symmetry is present. Speech is normal, no aphasia or dysarthria is noted. Extraocular movements are full. Visual fields are full.  Motor: The patient has good strength in all 4 extremities.  Sensory examination: Soft touch sensation is symmetric on the face, arms, and legs.  Coordination: The patient has good finger-nose-finger and heel-to-shin bilaterally.  Gait and station: The patient has a normal gait. Tandem gait is normal. Romberg is negative. No drift is seen.  Reflexes: Deep tendon reflexes are symmetric.   Assessment/Plan:  1. Chronic neck pain, left occipital neuralgia  2. Chronic low back pain, left-sided sciatica  The patient could potentially benefit from another epidural steroid injection, she will contact her nephrologist to see if this can be done. The patient will continue her gabapentin, she will follow-up in 6 months. The patient does not wish to try another medication such as Cymbalta or nortriptyline.  Jill Alexanders MD 02/14/2016 10:03 AM  [facility] [address] [place], [place] [address]  Phone [phone] Fax [phone]

## 2016-02-25 DIAGNOSIS — L570 Actinic keratosis: Secondary | ICD-10-CM | POA: Diagnosis not present

## 2016-02-28 ENCOUNTER — Encounter: Payer: Self-pay | Admitting: Endocrinology

## 2016-02-28 ENCOUNTER — Ambulatory Visit (INDEPENDENT_AMBULATORY_CARE_PROVIDER_SITE_OTHER): Payer: Medicare Other | Admitting: Endocrinology

## 2016-02-28 VITALS — BP 144/84 | HR 71 | Ht 61.0 in | Wt 126.0 lb

## 2016-02-28 DIAGNOSIS — M810 Age-related osteoporosis without current pathological fracture: Secondary | ICD-10-CM

## 2016-02-28 DIAGNOSIS — E7800 Pure hypercholesterolemia, unspecified: Secondary | ICD-10-CM

## 2016-02-28 DIAGNOSIS — N184 Chronic kidney disease, stage 4 (severe): Secondary | ICD-10-CM | POA: Diagnosis not present

## 2016-02-28 DIAGNOSIS — E785 Hyperlipidemia, unspecified: Secondary | ICD-10-CM | POA: Diagnosis not present

## 2016-02-28 DIAGNOSIS — E039 Hypothyroidism, unspecified: Secondary | ICD-10-CM

## 2016-02-28 DIAGNOSIS — E559 Vitamin D deficiency, unspecified: Secondary | ICD-10-CM

## 2016-02-28 DIAGNOSIS — I6523 Occlusion and stenosis of bilateral carotid arteries: Secondary | ICD-10-CM | POA: Diagnosis not present

## 2016-02-28 DIAGNOSIS — D509 Iron deficiency anemia, unspecified: Secondary | ICD-10-CM | POA: Diagnosis not present

## 2016-02-28 DIAGNOSIS — N2581 Secondary hyperparathyroidism of renal origin: Secondary | ICD-10-CM

## 2016-02-28 LAB — LIPID PANEL
Cholesterol: 214 mg/dL — ABNORMAL HIGH (ref 0–200)
HDL: 106.1 mg/dL (ref >39.00)
LDL CALC: 96 mg/dL (ref 0–99)
NONHDL: 108.13
Total CHOL/HDL Ratio: 2
Triglycerides: 62 mg/dL (ref 0.0–149.0)
VLDL: 12.4 mg/dL (ref 0.0–40.0)

## 2016-02-28 LAB — IBC PANEL
Iron: 52 ug/dL (ref 42–145)
SATURATION RATIOS: 13.9 % — AB (ref 20.0–50.0)
TRANSFERRIN: 268 mg/dL (ref 212.0–360.0)

## 2016-02-28 LAB — URINALYSIS, ROUTINE W REFLEX MICROSCOPIC
Bilirubin Urine: NEGATIVE
KETONES UR: NEGATIVE
LEUKOCYTES UA: NEGATIVE
NITRITE: NEGATIVE
SPECIFIC GRAVITY, URINE: 1.01 (ref 1.000–1.030)
URINE GLUCOSE: NEGATIVE
Urobilinogen, UA: 0.2 (ref 0.0–1.0)
pH: 6 (ref 5.0–8.0)

## 2016-02-28 LAB — CBC WITH DIFFERENTIAL/PLATELET
BASOS ABS: 0 10*3/uL (ref 0.0–0.1)
Basophils Relative: 0.5 % (ref 0.0–3.0)
EOS PCT: 2.6 % (ref 0.0–5.0)
Eosinophils Absolute: 0.2 10*3/uL (ref 0.0–0.7)
HCT: 34.4 % — ABNORMAL LOW (ref 36.0–46.0)
HEMOGLOBIN: 11.5 g/dL — AB (ref 12.0–15.0)
LYMPHS ABS: 1.5 10*3/uL (ref 0.7–4.0)
LYMPHS PCT: 19.3 % (ref 12.0–46.0)
MCHC: 33.3 g/dL (ref 30.0–36.0)
MCV: 87.8 fl (ref 78.0–100.0)
MONOS PCT: 6.2 % (ref 3.0–12.0)
Monocytes Absolute: 0.5 10*3/uL (ref 0.1–1.0)
NEUTROS PCT: 71.4 % (ref 43.0–77.0)
Neutro Abs: 5.7 10*3/uL (ref 1.4–7.7)
Platelets: 327 10*3/uL (ref 150.0–400.0)
RBC: 3.92 Mil/uL (ref 3.87–5.11)
RDW: 14.5 % (ref 11.5–15.5)
WBC: 7.9 10*3/uL (ref 4.0–10.5)

## 2016-02-28 LAB — BASIC METABOLIC PANEL
BUN: 46 mg/dL — ABNORMAL HIGH (ref 6–23)
CALCIUM: 9.8 mg/dL (ref 8.4–10.5)
CO2: 24 mEq/L (ref 19–32)
Chloride: 106 mEq/L (ref 96–112)
Creatinine, Ser: 3.23 mg/dL — ABNORMAL HIGH (ref 0.40–1.20)
GFR: 14.87 mL/min — AB (ref >60.00)
Glucose, Bld: 99 mg/dL (ref 70–99)
POTASSIUM: 4.3 meq/L (ref 3.5–5.1)
SODIUM: 140 meq/L (ref 135–145)

## 2016-02-28 LAB — URIC ACID: URIC ACID, SERUM: 7.2 mg/dL — AB (ref 2.4–7.0)

## 2016-02-28 LAB — VITAMIN D 25 HYDROXY (VIT D DEFICIENCY, FRACTURES): VITD: 21.41 ng/mL — AB (ref 30.00–100.00)

## 2016-02-28 LAB — TSH: TSH: 2.65 u[IU]/mL (ref 0.35–4.50)

## 2016-02-28 LAB — HEPATIC FUNCTION PANEL
ALK PHOS: 78 U/L (ref 39–117)
ALT: 19 U/L (ref 0–35)
AST: 27 U/L (ref 0–37)
Albumin: 4.1 g/dL (ref 3.5–5.2)
BILIRUBIN DIRECT: 0.1 mg/dL (ref 0.0–0.3)
BILIRUBIN TOTAL: 0.3 mg/dL (ref 0.2–1.2)
TOTAL PROTEIN: 7.5 g/dL (ref 6.0–8.3)

## 2016-02-28 MED ORDER — VITAMIN D (ERGOCALCIFEROL) 1.25 MG (50000 UNIT) PO CAPS: 50000.0000 [IU] | ORAL_CAPSULE | ORAL | 0 refills | Status: AC

## 2016-02-28 NOTE — Patient Instructions (Addendum)
Please consider these measures for your health:  minimize alcohol.  Do not use tobacco products.  Have a colonoscopy at least every 10 years from age 74.  Women should have an annual mammogram from age 72.  Keep firearms safely stored.  Always use seat belts.  have working smoke alarms in your home.  See an eye doctor and dentist regularly.  Never drive under the influence of alcohol or drugs (including prescription drugs).  Those with fair skin should take precautions against the sun, and should carefully examine their skin once per month, for any new or changed moles. It is critically important to prevent falling down (keep floor areas well-lit, dry, and free of loose objects.  If you have a cane, walker, or wheelchair, you should use it, even for short trips around the house.  Wear flat-soled shoes.  Also, try not to rush).   Please return in 1 year, or sooner if you get sick or hurt.

## 2016-02-28 NOTE — Progress Notes (Signed)
we discussed code status.  pt requests full code, but would not want to be started or maintained on artificial life-support measures if there was not a reasonable chance of recovery 

## 2016-02-28 NOTE — Progress Notes (Signed)
Subjective:    Patient ID: Katrina Yang, female    DOB: November 19, 1940, 75 y.o.   MRN: [id number]  HPI The state of at least three ongoing medical problems is addressed today, with interval history of each noted here: Anemia: she denies any further BRBPR.   Renal insufficiency: she denies dysuria.. Vit-D deficiency: she has leg cramps. Past Medical History:  Diagnosis Date  . Carotid bruit    a.  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  . Cervical spondylosis without myelopathy 02/01/2015  . CKD (chronic kidney disease), stage IV ([place])   . Colonic polyp    hx of  . CVA (cerebral vascular accident) ([place])    hx of  . Degenerative joint disease   . Diverticulosis of colon (without mention of hemorrhage)   . GERD (gastroesophageal reflux disease)   . History of hysterectomy   . Hypertension    a. Renal artery dopplers in 2012 with no evidence for significant renal artery stenosis.   . Occipital neuralgia of left side 08/30/2013  . Osteoporosis, unspecified   . Palpitations    a. Event monitor (8/14) with no significant abnormalities.   . Peripheral neuropathic pain   . Personal history of unspecified urinary disorder   . Pulmonary nodule   . Pure hypercholesterolemia   . Secondary hyperparathyroidism (of renal origin)   . Unspecified hypothyroidism     Past Surgical History:  Procedure Laterality Date  . ABDOMINAL HYSTERECTOMY    . BACK SURGERY     diskectomy L-5. Multiple back surgeries  . MENISCECTOMY Left 11/14/15    Social History   Social History  . Marital status: Married    Spouse name: [name]  . Number of children: 1  . Years of education: 2   Occupational History  . 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 Smoking status: Never Smoker  . Smokeless tobacco: Never Used  . Alcohol use No  . Drug use: No  . Sexual activity: Not on file   Other Topics Concern  . Not on file   Social History Narrative   Pt lives at  home with her spouse.   Patient drinks 1 glass of tea every other day.   Patient is right handed.    Current Outpatient Prescriptions on File Prior to Visit  Medication Sig Dispense Refill  . amLODipine (NORVASC) 10 MG tablet Take 10 mg by mouth daily.     Marland Kitchen aspirin 81 MG tablet Take 1 tablet (81 mg total) by mouth daily.    . calcitRIOL (ROCALTROL) 0.25 MCG capsule TAKE 1 CAPSULE DAILY 90 capsule 2  . clopidogrel (PLAVIX) 75 MG tablet Take 1 tablet (75 mg total) by mouth daily. 90 tablet 2  . gabapentin (NEURONTIN) 100 MG capsule Take 1 capsule (100 mg total) by mouth daily. 90 capsule 3  . levothyroxine (SYNTHROID, LEVOTHROID) 88 MCG tablet Take 1 tablet (88 mcg total) by mouth daily before breakfast. 90 tablet 3  . metoprolol tartrate (LOPRESSOR) 25 MG tablet Take 1 tablet (25 mg total) by mouth 2 (two) times daily. 180 tablet 3  . rosuvastatin (CRESTOR) 10 MG tablet Take 0.5 tablets (5 mg total) by mouth daily. 90 tablet 3   No current facility-administered medications on file prior to visit.     Allergies  Allergen Reactions  . Iodinated Diagnostic Agents Swelling    Throat swelling  . Lovastatin     unknown  . Metronidazole  unknown  . Sulfonamide Derivatives     unknown  . Codeine Rash  . Latex Rash    Family History  Problem Relation Age of Onset  . Kidney cancer Father   . Heart attack Father   . Heart disease Father   . Colon cancer Mother   . Ovarian cancer Mother   . Heart disease Mother   . Heart failure Mother   . Stomach cancer Neg Hx     BP (!) 144/84   Pulse 71   Ht 5\' 1"  (1.549 m)   Wt 126 lb (57.2 kg)   SpO2 93%   BMI 23.81 kg/m    Review of Systems Denies hematuria.  No change in chronic numbness of the LLE.      Objective:   Physical Exam VITAL SIGNS:  See vs page.  GENERAL: no distress.  LUNGS:  Clear to auscultation.  HEART:  Regular rate and rhythm without murmurs noted. Normal S1,S2.  Ext: trace bilat leg edema.    Lab  Results  Component Value Date   CREATININE 3.23 (H) 02/28/2016   BUN 46 (H) 02/28/2016   NA 140 02/28/2016   K 4.3 02/28/2016   CL 106 02/28/2016   CO2 24 02/28/2016   Lab Results  Component Value Date   WBC 7.9 02/28/2016   HGB 11.5 (L) 02/28/2016   HCT 34.4 (L) 02/28/2016   MCV 87.8 02/28/2016   PLT 327.0 02/28/2016   Lab Results  Component Value Date   IRON 52 02/28/2016       Assessment & Plan:  fe-deficiency anemia: improved, but persistent Renal insufficiency: worse Vit-D deficiency: persistent  Please continue to follow up with the kidney specialist.  add 1 more iron pill per day.  I have sent a prescription to your pharmacy, for vit-D. You would take 3 times per week for 4 weeks, then you are done.    Subjective:   Patient here for Medicare annual wellness visit and management of other chronic and acute problems.     Risk factors: advanced age    67 of Physicians Providing Medical Care to Patient:  See "snapshot"   Activities of Daily Living: In your present state of health, do you have any difficulty performing the following activities (lives with husband)?:  Preparing food and eating?: No  Bathing yourself: No  Getting dressed: No  Using the toilet: No  Moving around from place to place: No  In the past year have you fallen or had a near fall?: No  Home Safety: Has smoke detector and wears seat belts. Firearms are safely stored. No excess sun exposure.   Diet and Exercise  Current exercise habits: pt says good Dietary issues discussed: pt reports a healthy diet   Depression Screen  Q1: Over the past two weeks, have you felt down, depressed or hopeless? no  Q2: Over the past two weeks, have you felt little interest or pleasure in doing things? no   The following portions of the patient's history were reviewed and updated as appropriate: allergies, current medications, past family history, past medical history, past social history, past  surgical history and problem list.   Review of Systems  Denies hearing loss, and visual loss Objective:   Vision:  Advertising account executive, so she declines VA today Hearing: grossly normal Body mass index:  See vs page Msk: pt easily and quickly performs "get-up-and-go" from a sitting position Cognitive Impairment Assessment: cognition, memory and judgment appear normal.  remembers 3/3  at 5 minutes.  excellent recall.  can easily read and write a sentence.  alert and oriented x 3   Assessment:   Medicare wellness utd on preventive parameters    Plan:   During the course of the visit the patient was educated and counseled about appropriate screening and preventive services including:        Fall prevention   Screening mammography  Bone densitometry screening  Diabetes screening  Nutrition counseling   Vaccines / LABS Zostavax / Pneumococcal Vaccine  today   Patient Instructions (the written plan) was given to the patient.

## 2016-02-29 LAB — PTH, INTACT AND CALCIUM
CALCIUM: 9.5 mg/dL (ref 8.6–10.4)
PTH: 37 pg/mL (ref 14–64)

## 2016-03-05 DIAGNOSIS — N189 Chronic kidney disease, unspecified: Secondary | ICD-10-CM | POA: Diagnosis not present

## 2016-03-05 DIAGNOSIS — M1712 Unilateral primary osteoarthritis, left knee: Secondary | ICD-10-CM | POA: Diagnosis not present

## 2016-03-05 DIAGNOSIS — D509 Iron deficiency anemia, unspecified: Secondary | ICD-10-CM | POA: Diagnosis not present

## 2016-03-05 DIAGNOSIS — I10 Essential (primary) hypertension: Secondary | ICD-10-CM | POA: Diagnosis not present

## 2016-03-05 DIAGNOSIS — N184 Chronic kidney disease, stage 4 (severe): Secondary | ICD-10-CM | POA: Diagnosis not present

## 2016-03-05 DIAGNOSIS — D631 Anemia in chronic kidney disease: Secondary | ICD-10-CM | POA: Diagnosis not present

## 2016-03-05 DIAGNOSIS — N2581 Secondary hyperparathyroidism of renal origin: Secondary | ICD-10-CM | POA: Diagnosis not present

## 2016-03-28 ENCOUNTER — Other Ambulatory Visit: Payer: Self-pay | Admitting: Endocrinology

## 2016-03-29 ENCOUNTER — Other Ambulatory Visit: Payer: Self-pay | Admitting: Endocrinology

## 2016-04-23 ENCOUNTER — Other Ambulatory Visit: Payer: Self-pay | Admitting: Endocrinology

## 2016-04-23 ENCOUNTER — Other Ambulatory Visit: Payer: Self-pay | Admitting: Neurology

## 2016-04-30 DIAGNOSIS — M7122 Synovial cyst of popliteal space [Baker], left knee: Secondary | ICD-10-CM | POA: Diagnosis not present

## 2016-04-30 DIAGNOSIS — M1712 Unilateral primary osteoarthritis, left knee: Secondary | ICD-10-CM | POA: Diagnosis not present

## 2016-04-30 DIAGNOSIS — M25562 Pain in left knee: Secondary | ICD-10-CM | POA: Diagnosis not present

## 2016-05-12 DIAGNOSIS — M1712 Unilateral primary osteoarthritis, left knee: Secondary | ICD-10-CM | POA: Diagnosis not present

## 2016-06-21 ENCOUNTER — Encounter (HOSPITAL_COMMUNITY): Payer: Self-pay | Admitting: Emergency Medicine

## 2016-06-21 ENCOUNTER — Emergency Department (HOSPITAL_COMMUNITY): Payer: Medicare Other

## 2016-06-21 ENCOUNTER — Emergency Department (HOSPITAL_COMMUNITY)
Admission: EM | Admit: 2016-06-21 | Discharge: 2016-06-21 | Disposition: A | Discharge status: Home / Self Care | Payer: Medicare Other | Attending: Emergency Medicine | Admitting: Emergency Medicine

## 2016-06-21 DIAGNOSIS — Z79899 Other long term (current) drug therapy: Secondary | ICD-10-CM | POA: Insufficient documentation

## 2016-06-21 DIAGNOSIS — Z8673 Personal history of transient ischemic attack (TIA), and cerebral infarction without residual deficits: Secondary | ICD-10-CM | POA: Insufficient documentation

## 2016-06-21 DIAGNOSIS — Z91040 Latex allergy status: Secondary | ICD-10-CM | POA: Insufficient documentation

## 2016-06-21 DIAGNOSIS — Z7982 Long term (current) use of aspirin: Secondary | ICD-10-CM | POA: Insufficient documentation

## 2016-06-21 DIAGNOSIS — N184 Chronic kidney disease, stage 4 (severe): Secondary | ICD-10-CM | POA: Diagnosis not present

## 2016-06-21 DIAGNOSIS — I129 Hypertensive chronic kidney disease with stage 1 through stage 4 chronic kidney disease, or unspecified chronic kidney disease: Secondary | ICD-10-CM | POA: Diagnosis not present

## 2016-06-21 DIAGNOSIS — K5732 Diverticulitis of large intestine without perforation or abscess without bleeding: Secondary | ICD-10-CM | POA: Insufficient documentation

## 2016-06-21 DIAGNOSIS — E039 Hypothyroidism, unspecified: Secondary | ICD-10-CM | POA: Diagnosis not present

## 2016-06-21 DIAGNOSIS — R1030 Lower abdominal pain, unspecified: Secondary | ICD-10-CM | POA: Diagnosis present

## 2016-06-21 DIAGNOSIS — K5730 Diverticulosis of large intestine without perforation or abscess without bleeding: Secondary | ICD-10-CM | POA: Diagnosis not present

## 2016-06-21 LAB — BASIC METABOLIC PANEL
Anion gap: 10 (ref 5–15)
BUN: 39 mg/dL — ABNORMAL HIGH (ref 6–20)
CO2: 24 mmol/L (ref 22–32)
Calcium: 9.4 mg/dL (ref 8.9–10.3)
Chloride: 106 mmol/L (ref 101–111)
Creatinine, Ser: 3.49 mg/dL — ABNORMAL HIGH (ref 0.44–1.00)
GFR calc Af Amer: 14 mL/min — ABNORMAL LOW (ref >60)
GFR calc non Af Amer: 12 mL/min — ABNORMAL LOW (ref >60)
Glucose, Bld: 91 mg/dL (ref 65–99)
Potassium: 4.2 mmol/L (ref 3.5–5.1)
Sodium: 140 mmol/L (ref 135–145)

## 2016-06-21 LAB — AMYLASE: Amylase: 94 U/L (ref 28–100)

## 2016-06-21 LAB — URINALYSIS, ROUTINE W REFLEX MICROSCOPIC
Bacteria, UA: NONE SEEN
Bilirubin Urine: NEGATIVE
Glucose, UA: NEGATIVE mg/dL
Ketones, ur: NEGATIVE mg/dL
Nitrite: NEGATIVE
PROTEIN: 30 mg/dL — AB
Specific Gravity, Urine: 1.013 (ref 1.005–1.030)
pH: 5 (ref 5.0–8.0)

## 2016-06-21 LAB — CBC WITH DIFFERENTIAL/PLATELET
Basophils Absolute: 0 10*3/uL (ref 0.0–0.1)
Basophils Relative: 0 %
Eosinophils Absolute: 0.2 10*3/uL (ref 0.0–0.7)
Eosinophils Relative: 2 %
HEMATOCRIT: 34.7 % — AB (ref 36.0–46.0)
HEMOGLOBIN: 11.1 g/dL — AB (ref 12.0–15.0)
LYMPHS ABS: 1.8 10*3/uL (ref 0.7–4.0)
LYMPHS PCT: 18 %
MCH: 28.7 pg (ref 26.0–34.0)
MCHC: 32 g/dL (ref 30.0–36.0)
MCV: 89.7 fL (ref 78.0–100.0)
Monocytes Absolute: 0.7 10*3/uL (ref 0.1–1.0)
Monocytes Relative: 7 %
NEUTROS ABS: 7.3 10*3/uL (ref 1.7–7.7)
NEUTROS PCT: 73 %
Platelets: 345 10*3/uL (ref 150–400)
RBC: 3.87 MIL/uL (ref 3.87–5.11)
RDW: 14.3 % (ref 11.5–15.5)
WBC: 10 10*3/uL (ref 4.0–10.5)

## 2016-06-21 LAB — LIPASE, BLOOD: LIPASE: 29 U/L (ref 11–51)

## 2016-06-21 MED ORDER — CIPROFLOXACIN HCL 500 MG PO TABS
500.0000 mg | ORAL_TABLET | Freq: Once | ORAL | Status: AC
  Administered 2016-06-21: 500 mg via ORAL
  Filled 2016-06-21: qty 1

## 2016-06-21 MED ORDER — METRONIDAZOLE 500 MG PO TABS
500.0000 mg | ORAL_TABLET | Freq: Once | ORAL | Status: AC
  Administered 2016-06-21: 500 mg via ORAL
  Filled 2016-06-21: qty 1

## 2016-06-21 MED ORDER — METOPROLOL TARTRATE 25 MG PO TABS
25.0000 mg | ORAL_TABLET | Freq: Once | ORAL | Status: AC
  Administered 2016-06-21: 25 mg via ORAL
  Filled 2016-06-21: qty 1

## 2016-06-21 MED ORDER — BARIUM SULFATE 2.1 % PO SUSP
ORAL | Status: AC
  Filled 2016-06-21: qty 1

## 2016-06-21 MED ORDER — CIPROFLOXACIN HCL 500 MG PO TABS: 500.0000 mg | ORAL_TABLET | ORAL | 0 refills | Status: AC

## 2016-06-21 MED ORDER — METRONIDAZOLE 500 MG PO TABS: 500.0000 mg | ORAL_TABLET | Freq: Three times a day (TID) | ORAL | 0 refills | Status: AC

## 2016-06-21 MED ORDER — AMLODIPINE BESYLATE 5 MG PO TABS
10.0000 mg | ORAL_TABLET | Freq: Once | ORAL | Status: AC
  Administered 2016-06-21: 5 mg via ORAL
  Filled 2016-06-21: qty 2

## 2016-06-21 MED ORDER — CIPROFLOXACIN HCL 500 MG PO TABS: 500.0000 mg | ORAL_TABLET | Freq: Two times a day (BID) | ORAL | 0 refills | Status: DC

## 2016-06-21 MED ORDER — METRONIDAZOLE 500 MG PO TABS: 500.0000 mg | ORAL_TABLET | Freq: Three times a day (TID) | ORAL | 0 refills | Status: DC

## 2016-06-21 NOTE — ED Provider Notes (Signed)
[facility] DEPT Provider Note   CSN: [id number] Arrival date & time: 06/21/16  1447     History   Chief Complaint Chief Complaint  Patient presents with  . Abdominal Pain    rt. side    HPI Katrina Yang is a 76 y.o. female with history of CVA, CKD, diverticulosis, HTN presenting with right lower abdominal pain for 3 days. She states it has been constant, worsening, tender to palpation, soreness and 9/10. She states yesterday morning she felt better and then it got worse after eating lunch and has not improved. She states it is now only on the right lower side. She reports associated diarrhea on the first day, back pain. She denies fever, chills, nausea, vomiting, blood in the stools, urinary symptoms, dysuria, hematuria. She denies trying anything for her symptoms. Lying down makes her symptoms worse, turning, twisting. She states sitting around without movement makes it better. She denies having anything like this before. She denies history of kidney stone. She reports being followed by her PCP, kidney doctor. Patient has abdominal surgical history abdominal hysterectomy.   The history is provided by the patient. No language interpreter was used.  Abdominal Pain   Pertinent negatives include fever, diarrhea, nausea, vomiting and dysuria.    Past Medical History:  Diagnosis Date  . Carotid bruit    a.  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  . Cervical spondylosis without myelopathy 02/01/2015  . CKD (chronic kidney disease), stage IV ([place])   . Colonic polyp    hx of  . CVA (cerebral vascular accident) ([place])    hx of  . Degenerative joint disease   . Diverticulosis of colon (without mention of hemorrhage)   . GERD (gastroesophageal reflux disease)   . History of hysterectomy   . Hypertension    a. Renal artery dopplers in 2012 with no evidence for significant renal artery stenosis.   . Occipital neuralgia of left side 08/30/2013  . Osteoporosis, unspecified   .  Palpitations    a. Event monitor (8/14) with no significant abnormalities.   . Peripheral neuropathic pain   . Personal history of unspecified urinary disorder   . Pulmonary nodule   . Pure hypercholesterolemia   . Secondary hyperparathyroidism (of renal origin)   . Unspecified hypothyroidism     Patient Active Problem List   Diagnosis Date Noted  . Carotid artery disease ([place]) 11/05/2015  . Skin lesion 11/01/2015  . Medication reaction 11/01/2015  . CKD (chronic kidney disease) stage 4, GFR 15-29 ml/min ([facility]) 08/28/2015  . Anemia, iron deficiency 08/28/2015  . Temporary cerebral vascular dysfunction 03/14/2015  . Atrophic kidney 03/14/2015  . Frequent UTI 03/14/2015  . Dyslipidemia 03/14/2015  . Arthritis of knee, degenerative 03/14/2015  . 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 Cerebrovascular accident (CVA) ([place]) 03/14/2015  . Cervical spondylosis without myelopathy 02/01/2015  . Knee pain 06/28/2014  . Pain in joint, lower leg 06/28/2014  . Nerve root disorder 05/12/2014  . Post laminectomy syndrome 05/12/2014  . Occipital neuralgia of left side 08/30/2013  . Kidney cysts 07/20/2013  . Unspecified constipation 05/19/2013  . Avitaminosis D 02/01/2013  . Hyperparathyroidism due to renal insufficiency ([place]) 02/01/2013  . Low back pain 01/19/2013  . Numbness 07/21/2012  . Pneumonia, organism unspecified([facility]) 04/04/2011  . Encounter for long-term (current) use of other medications 01/03/2011  . Sinusitis, chronic 10/01/2010  . DYSPHAGIA UNSPECIFIED 05/16/2010  . POLYURIA 05/16/2010  . CELLULITIS AND ABSCESS OF FACE 02/19/2009  . ESOPHAGEAL MOTILITY  DISORDER 12/04/2008  . Pure hypercholesterolemia 08/17/2008  . Cerebral artery occlusion with cerebral infarction ([place]) 08/17/2008  . History of cardiovascular disorder 08/17/2008  . PULMONARY NODULE 04/27/2007  . Hypothyroidism 02/06/2007  . Essential hypertension 02/06/2007  . GERD 02/06/2007  . DIVERTICULOSIS,  COLON 02/06/2007  . DEGENERATIVE JOINT DISEASE, SPINE 02/06/2007  . Osteoporosis 02/06/2007  . PALPITATIONS 02/06/2007  . COLONIC POLYPS, HX OF 02/06/2007    Past Surgical History:  Procedure Laterality Date  . ABDOMINAL HYSTERECTOMY    . BACK SURGERY     diskectomy L-5. Multiple back surgeries  . MENISCECTOMY Left 11/14/15    OB History    No data available       Home Medications    Prior to Admission medications   Medication Sig Start Date End Date Taking? Authorizing Provider  amLODipine (NORVASC) 10 MG tablet Take 5 mg by mouth 2 (two) times daily.  06/19/15  Yes Historical Provider, MD  aspirin EC 81 MG tablet Take 81 mg by mouth daily.   Yes Historical Provider, MD  calcitRIOL (ROCALTROL) 0.25 MCG capsule TAKE 1 CAPSULE DAILY 03/30/16  Yes Renato Shin, MD  clopidogrel (PLAVIX) 75 MG tablet TAKE 1 TABLET (75 MG TOTAL) BY MOUTH DAILY. 04/23/16  Yes Kathrynn Ducking, MD  gabapentin (NEURONTIN) 100 MG capsule Take 1 capsule (100 mg total) by mouth daily. Patient taking differently: Take 100 mg by mouth at bedtime.  10/22/15  Yes Kathrynn Ducking, MD  levothyroxine (SYNTHROID, LEVOTHROID) 88 MCG tablet Take 1 tablet (88 mcg total) by mouth daily before breakfast. 09/20/15  Yes Renato Shin, MD  lidocaine (LIDODERM) 5 % Place 1 patch onto the skin daily as needed (pain). Remove & Discard patch within 12 hours or as directed by MD   Yes Historical Provider, MD  metoprolol tartrate (LOPRESSOR) 25 MG tablet Take 1 tablet (25 mg total) by mouth 2 (two) times daily. 10/08/15  Yes Renato Shin, MD  rosuvastatin (CRESTOR) 10 MG tablet TAKE 1/2 TABLET EVERY DAY 04/23/16  Yes Renato Shin, MD  ciprofloxacin (CIPRO) 500 MG tablet Take 1 tablet (500 mg total) by mouth every 18 (eighteen) hours. 06/21/16 07/01/16  [place] Manuel Aleaya Latona, PA  metroNIDAZOLE (FLAGYL) 500 MG tablet Take 1 tablet (500 mg total) by mouth 3 (three) times daily. 06/21/16 06/29/16  [place] Manuel Marquiz Sotelo, PA  rosuvastatin  (CRESTOR) 10 MG tablet Take 0.5 tablets (5 mg total) by mouth daily. Patient not taking: Reported on 06/21/2016 08/14/15   Renato Shin, MD    Family History Family History  Problem Relation Age of Onset  . Kidney cancer Father   . Heart attack Father   . Heart disease Father   . Colon cancer Mother   . Ovarian cancer Mother   . Heart disease Mother   . Heart failure Mother   . Stomach cancer Neg Hx     Social History Social History  Substance Use Topics  . Smoking status: Never Smoker  . Smokeless tobacco: Never Used  . Alcohol use No     Allergies   Iodinated diagnostic agents; Lovastatin; Metronidazole; Sulfonamide derivatives; Codeine; and Latex   Review of Systems Review of Systems  Constitutional: Negative for chills and fever.  Respiratory: Negative for cough and shortness of breath.   Cardiovascular: Negative for chest pain.  Gastrointestinal: Positive for abdominal pain. Negative for blood in stool, diarrhea, nausea and vomiting.  Genitourinary: Negative for difficulty urinating and dysuria.  Musculoskeletal: Positive for back pain.  Skin: Negative for  rash and wound.  Neurological: Negative for numbness.  All other systems reviewed and are negative.    Physical Exam Updated Vital Signs BP 148/74 (BP Location: Right Arm)   Pulse 88   Temp 98.7 F (37.1 C) (Oral)   Resp 16   SpO2 99%   Physical Exam  Constitutional: She is oriented to person, place, and time. She appears well-developed and well-nourished.  Well appearing  HENT:  Head: Normocephalic and atraumatic.  Nose: Nose normal.  Eyes: Conjunctivae and EOM are normal.  Neck: Normal range of motion.  Cardiovascular: Normal rate.   Pulmonary/Chest: Effort normal. No respiratory distress.  Normal work of breathing. No respiratory distress noted.   Abdominal: Soft. Bowel sounds are normal. There is tenderness. There is guarding. There is no rebound.  Soft. Tender to Mcburney's point with  guarding. Negative rovsing's sign. Positive obturator sign. Positive psoas sign.   Musculoskeletal: Normal range of motion.  Neurological: She is alert and oriented to person, place, and time.  Skin: Skin is warm.  Psychiatric: She has a normal mood and affect. Her behavior is normal.  Nursing note and vitals reviewed.    ED Treatments / Results  Labs (all labs ordered are listed, but only abnormal results are displayed) Labs Reviewed  BASIC METABOLIC PANEL - Abnormal; Notable for the following:       Result Value   BUN 39 (*)    Creatinine, Ser 3.49 (*)    GFR calc non Af Amer 12 (*)    GFR calc Af Amer 14 (*)    All other components within normal limits  CBC WITH DIFFERENTIAL/PLATELET - Abnormal; Notable for the following:    Hemoglobin 11.1 (*)    HCT 34.7 (*)    All other components within normal limits  URINALYSIS, ROUTINE W REFLEX MICROSCOPIC - Abnormal; Notable for the following:    Hgb urine dipstick SMALL (*)    Protein, ur 30 (*)    Leukocytes, UA MODERATE (*)    Squamous Epithelial / LPF 0-5 (*)    All other components within normal limits  URINE CULTURE  AMYLASE  LIPASE, BLOOD    EKG  EKG Interpretation None       Radiology Ct Abdomen Pelvis Wo Contrast  Result Date: 06/21/2016 CLINICAL DATA:  Right-sided abdominal pain since Thursday. Right lower quadrant pain. No IV contrast material used due to contrast allergy. Previous history of hypertension, palpitations, secondary hyperparathyroidism, pulmonary nodule, gastroesophageal reflux disease, colonic diverticulosis and colonic polyp, osteoporosis. EXAM: CT ABDOMEN AND PELVIS WITHOUT CONTRAST TECHNIQUE: Multidetector CT imaging of the abdomen and pelvis was performed following the standard protocol without IV contrast. COMPARISON:  None. FINDINGS: Lower chest: Mild dependent changes in the lung bases. Coronary artery and aortic calcifications. Hepatobiliary: No focal liver abnormality is seen. No gallstones,  gallbladder wall thickening, or biliary dilatation. Pancreas: Unremarkable. No pancreatic ductal dilatation or surrounding inflammatory changes. Spleen: Normal in size without focal abnormality. Adrenals/Urinary Tract: No adrenal gland nodules. Multiple punctate calcifications in both kidneys. No hydronephrosis or hydroureter. Low-attenuation lesions throughout both kidneys likely representing cysts. Bladder is decompressed. Stomach/Bowel: Stomach, small bowel, and colon are not abnormally distended. No wall thickening. Scattered diverticula in the colon. Mild pericolonic fat infiltration around the medial aspect of the cecum associated with several diverticula. This suggests mild right colonic diverticulitis. The appendix is separately seen outside of this area and is normal. No abscess. Vascular/Lymphatic: Aortic atherosclerosis. No enlarged abdominal or pelvic lymph nodes. Reproductive: Status post hysterectomy.  No adnexal masses. Other: Small periumbilical abdominal wall hernia containing fat. No free air or free fluid in the abdomen. Musculoskeletal: Degenerative changes in the spine. No destructive bone lesions. IMPRESSION: Diverticulosis of the colon with slight infiltrative changes around the cecum suggesting right colonic diverticulitis. Appendix is normal. No abscess. Multiple tiny nonobstructing stones in the kidneys. Low-attenuation lesions in the kidneys are difficult to characterize on noncontrast imaging but are likely cysts. Electronically Signed   By: Lucienne Capers M.D.   On: 06/21/2016 21:15    Procedures Procedures (including critical care time)  Medications Ordered in ED Medications  Barium Sulfate 2.1 % SUSP (not administered)  amLODipine (NORVASC) tablet 10 mg (5 mg Oral Given 06/21/16 1935)  metoprolol tartrate (LOPRESSOR) tablet 25 mg (25 mg Oral Given 06/21/16 1935)  metroNIDAZOLE (FLAGYL) tablet 500 mg (500 mg Oral Given 06/21/16 2236)  ciprofloxacin (CIPRO) tablet 500 mg (500  mg Oral Given 06/21/16 2236)     Initial Impression / Assessment and Plan / ED Course  I have reviewed the triage vital signs and the nursing notes.  Pertinent labs & imaging results that were available during my care of the patient were reviewed by me and considered in my medical decision making (see chart for details).    Patient is shown to have right colonic diverticulitis on CT. Does not show to have abscess or complication. Appendix is normal and Ct, and nonobstructive stones on kidney. Low suspicion for obstructive kidney stone, appendicitis, hernia or any other pathology. On exam patient is afebrile, stable. She does have tenderness to the right lower quadrant. Lab work is near her baseline. Urinalysis shows a possible urinary tract infection as well. Patient really dosed for ciprofloxacin and Flagyl. Patient given resources to follow-up with her primary care provider on Monday regarding today's visit. Reasons to return immediately to the emergency department discussed. Patient verbalized understanding and agreeable with plan. Pt also seen and evaluated by Dr. Oleta Mouse.    Final Clinical Impressions(s) / ED Diagnoses   Final diagnoses:  Diverticulitis of large intestine without perforation or abscess without bleeding    New Prescriptions Discharge Medication List as of 06/21/2016 10:28 PM       [place], [place] 06/22/16 [address] [place], [place] 06/22/16 6979    Forde Dandy, MD 06/22/16 1321

## 2016-06-21 NOTE — ED Triage Notes (Signed)
Pt. Stated, I went to UC after having rt. Side abd. Pain that started on Thursday.Told to come here.

## 2016-06-21 NOTE — ED Provider Notes (Signed)
Medical screening examination/treatment/procedure(s) were conducted as a shared visit with non-physician practitioner(s) and myself.  I personally evaluated the patient during the encounter.   EKG Interpretation None      76 year old female who presents with right lower abdominal pain. History of abdominal hysterectomy, hypertension, hyperlipidemia and prior CVA on Plavix. 2-3 days ago had generalized abdominal pain, but has had gradually worsening right lower quadrant abdominal pain over the past day. He does have some loose stools earlier today but no melena or hematochezia. No fevers, chills, nausea or vomiting, dysuria or urinary frequency or hematuria.  Afebrile and hemodynamically stable. Well-appearing and in no acute distress. Tender in the right lower quadrant. CT abd/pelvis visualized. Evidence of right colonic diverticulitis. Start on renally dosed ciprofloxacin and flagyl.  Strict return and follow-up instructions reviewed. She expressed understanding of all discharge instructions and felt comfortable with the plan of care.    Forde Dandy, MD 06/21/16 [phone]

## 2016-06-21 NOTE — ED Notes (Signed)
Taken to CT at this time. 

## 2016-06-21 NOTE — Discharge Instructions (Signed)
First dose of antibiotics given here in ED. Please follow-up with your primary care provider in 2 days regarding today's visit. Please take metronidazole 3 times daily and ciprofloxacin every 18 hours both for 7 days. Drink plenty of fluids throughout the day. Maintain high fiber diet.   IMPRESSION:  Diverticulosis of the colon with slight infiltrative changes around  the cecum suggesting right colonic diverticulitis. Appendix is  normal. No abscess. Multiple tiny nonobstructing stones in the  kidneys. Low-attenuation lesions in the kidneys are difficult to  characterize on noncontrast imaging but are likely cysts.    Contact a health care provider if: Your pain does not improve. You have a hard time eating food. Your bowel movements do not return to normal. Get help right away if: Your pain becomes worse. Your symptoms do not get better. Your symptoms suddenly get worse. You have a fever. You have repeated vomiting. You have bloody or black, tarry stools.

## 2016-06-23 LAB — URINE CULTURE: Culture: <10000 — AB

## 2016-06-24 ENCOUNTER — Telehealth: Payer: Self-pay | Admitting: Gastroenterology

## 2016-06-24 NOTE — Telephone Encounter (Signed)
Patient was seen in ED over the weekend for diverticulitis, started on Cipro and Flagyl. She was concerned as she knows she has an allergy to the Flagyl. Let her know that what I could see on her allergy list, is that it caused her nausea and vomiting. Told her that is a common side effect, suggested that she follow the instruction on taking this with food. She states she is not having a problem now. She was instructed to make an appointment. Patient scheduled for 2/23 with Dr. Havery Moros.

## 2016-06-24 NOTE — Telephone Encounter (Signed)
Agree with your plan. Thanks

## 2016-06-27 ENCOUNTER — Ambulatory Visit (INDEPENDENT_AMBULATORY_CARE_PROVIDER_SITE_OTHER): Payer: Medicare Other | Admitting: Gastroenterology

## 2016-06-27 ENCOUNTER — Encounter: Payer: Self-pay | Admitting: Gastroenterology

## 2016-06-27 VITALS — BP 124/70 | HR 68 | Ht 62.0 in | Wt 123.0 lb

## 2016-06-27 DIAGNOSIS — R935 Abnormal findings on diagnostic imaging of other abdominal regions, including retroperitoneum: Secondary | ICD-10-CM

## 2016-06-27 DIAGNOSIS — K5732 Diverticulitis of large intestine without perforation or abscess without bleeding: Secondary | ICD-10-CM

## 2016-06-27 MED ORDER — ONDANSETRON 4 MG PO TBDP: 4.0000 mg | ORAL_TABLET | Freq: Four times a day (QID) | ORAL | 0 refills | Status: DC | PRN

## 2016-06-27 MED ORDER — NA SULFATE-K SULFATE-MG SULF 17.5-3.13-1.6 GM/177ML PO SOLN: 1.0000 | Freq: Once | ORAL | 0 refills | Status: AC

## 2016-06-27 NOTE — Patient Instructions (Signed)
If you are age 75 or older, your body mass index should be between 23-30. Your Body mass index is 22.5 kg/m. If this is out of the aforementioned range listed, please consider follow up with your Primary Care Provider.  If you are age 59 or younger, your body mass index should be between 19-25. Your Body mass index is 22.5 kg/m. If this is out of the aformentioned range listed, please consider follow up with your Primary Care Provider.   We have sent the following medications to your pharmacy for you to pick up at your convenience:  [place] have been scheduled for a colonoscopy. Please follow written instructions given to you at your visit today.  Please pick up your prep supplies at the pharmacy within the next 1-3 days. If you use inhalers (even only as needed), please bring them with you on the day of your procedure. Your physician has requested that you go to www.startemmi.com and enter the access code given to you at your visit today. This web site gives a general overview about your procedure. However, you should still follow specific instructions given to you by our office regarding your preparation for the procedure.  Thank you.

## 2016-06-27 NOTE — Progress Notes (Signed)
HPI :  76 year old female here for follow-up visit.  She was seen in the ER for diverticulitis this past weekend. She presented to the ER last week with severe pain in the RLQ. She had some diarrhea with this. CT scan showed "cecal diverticulitis" without appendicitis. She was given cipro and flagyl. She is having side effects from the flagyl, causing nausea, taking BID instead of TID. She is tolerating the Cipro okay. This is the first time she has ever had prior diverticulitis. She antibiotics have helped and her pain is significantly improved.   She has been taking Citrucel every day. She is not having any further blood in her stools at present, previously seen for hemorrhoidal bleeding, but had some slight bleeding in the setting of constipation a few weeks ago.   CT scan 06/21/16 - right colon diverticulitis (cecum), normal appendix, renal stones  Her last colonoscopy was in 05/2013, diminutive polyp adenoma removed, otherwise normal other than diverticulosis in the left colon only, no right sided diverticulosis noted.  She has been on Plavix for history of CVA in 2003 and have had 4 TIAs in the past as well, the last in 2007  Past Medical History:  Diagnosis Date  . Carotid bruit    a.  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  . Cervical spondylosis without myelopathy 02/01/2015  . CKD (chronic kidney disease), stage IV ([place])   . Colonic polyp    hx of  . CVA (cerebral vascular accident) ([place])    hx of  . Degenerative joint disease   . Diverticulosis of colon (without mention of hemorrhage)   . GERD (gastroesophageal reflux disease)   . History of hysterectomy   . Hypertension    a. Renal artery dopplers in 2012 with no evidence for significant renal artery stenosis.   . Occipital neuralgia of left side 08/30/2013  . Osteoporosis, unspecified   . Palpitations    a. Event monitor (8/14) with no significant abnormalities.   . Peripheral neuropathic pain   . Personal history of  unspecified urinary disorder   . Pulmonary nodule   . Pure hypercholesterolemia   . Secondary hyperparathyroidism (of renal origin)   . Unspecified hypothyroidism      Past Surgical History:  Procedure Laterality Date  . ABDOMINAL HYSTERECTOMY    . BACK SURGERY     diskectomy L-5. Multiple back surgeries  . MENISCECTOMY Left 11/14/15   Family History  Problem Relation Age of Onset  . Kidney cancer Father   . Heart attack Father   . Heart disease Father   . Colon cancer Mother   . Ovarian cancer Mother   . Heart disease Mother   . Heart failure Mother   . Stomach cancer Neg Hx    Social History  Substance Use Topics  . Smoking status: Never Smoker  . Smokeless tobacco: Never Used  . Alcohol use No   Current Outpatient Prescriptions  Medication Sig Dispense Refill  . amLODipine (NORVASC) 10 MG tablet Take 5 mg by mouth 2 (two) times daily.     Marland Kitchen aspirin EC 81 MG tablet Take 81 mg by mouth daily.    . calcitRIOL (ROCALTROL) 0.25 MCG capsule TAKE 1 CAPSULE DAILY 90 capsule 2  . ciprofloxacin (CIPRO) 500 MG tablet Take 1 tablet (500 mg total) by mouth every 18 (eighteen) hours. 10 tablet 0  . clopidogrel (PLAVIX) 75 MG tablet TAKE 1 TABLET (75 MG TOTAL) BY MOUTH DAILY. 90 tablet 2  . gabapentin (NEURONTIN)  100 MG capsule Take 1 capsule (100 mg total) by mouth daily. (Patient taking differently: Take 100 mg by mouth at bedtime. ) 90 capsule 3  . levothyroxine (SYNTHROID, LEVOTHROID) 88 MCG tablet Take 1 tablet (88 mcg total) by mouth daily before breakfast. 90 tablet 3  . lidocaine (LIDODERM) 5 % Place 1 patch onto the skin daily as needed (pain). Remove & Discard patch within 12 hours or as directed by MD    . metoprolol tartrate (LOPRESSOR) 25 MG tablet Take 1 tablet (25 mg total) by mouth 2 (two) times daily. 180 tablet 3  . metroNIDAZOLE (FLAGYL) 500 MG tablet Take 1 tablet (500 mg total) by mouth 3 (three) times daily. (Patient taking differently: Take 500 mg by mouth 3  (three) times daily. Pt takes 1-2 tabs daily due to causing nausea and tiredness) 24 tablet 0  . rosuvastatin (CRESTOR) 10 MG tablet Take 0.5 tablets (5 mg total) by mouth daily. 90 tablet 3  . rosuvastatin (CRESTOR) 10 MG tablet TAKE 1/2 TABLET EVERY DAY 90 tablet 2   No current facility-administered medications for this visit.    Allergies  Allergen Reactions  . Iodinated Diagnostic Agents Swelling    Throat swelling  . Lovastatin     Unknown reaction  . Metronidazole Nausea And Vomiting  . Sulfonamide Derivatives     Unknown allergic reaction  . Codeine Rash  . Latex Rash     Review of Systems: All systems reviewed and negative except where noted in HPI.    Ct Abdomen Pelvis Wo Contrast  Result Date: 06/21/2016 CLINICAL DATA:  Right-sided abdominal pain since Thursday. Right lower quadrant pain. No IV contrast material used due to contrast allergy. Previous history of hypertension, palpitations, secondary hyperparathyroidism, pulmonary nodule, gastroesophageal reflux disease, colonic diverticulosis and colonic polyp, osteoporosis. EXAM: CT ABDOMEN AND PELVIS WITHOUT CONTRAST TECHNIQUE: Multidetector CT imaging of the abdomen and pelvis was performed following the standard protocol without IV contrast. COMPARISON:  None. FINDINGS: Lower chest: Mild dependent changes in the lung bases. Coronary artery and aortic calcifications. Hepatobiliary: No focal liver abnormality is seen. No gallstones, gallbladder wall thickening, or biliary dilatation. Pancreas: Unremarkable. No pancreatic ductal dilatation or surrounding inflammatory changes. Spleen: Normal in size without focal abnormality. Adrenals/Urinary Tract: No adrenal gland nodules. Multiple punctate calcifications in both kidneys. No hydronephrosis or hydroureter. Low-attenuation lesions throughout both kidneys likely representing cysts. Bladder is decompressed. Stomach/Bowel: Stomach, small bowel, and colon are not abnormally distended.  No wall thickening. Scattered diverticula in the colon. Mild pericolonic fat infiltration around the medial aspect of the cecum associated with several diverticula. This suggests mild right colonic diverticulitis. The appendix is separately seen outside of this area and is normal. No abscess. Vascular/Lymphatic: Aortic atherosclerosis. No enlarged abdominal or pelvic lymph nodes. Reproductive: Status post hysterectomy. No adnexal masses. Other: Small periumbilical abdominal wall hernia containing fat. No free air or free fluid in the abdomen. Musculoskeletal: Degenerative changes in the spine. No destructive bone lesions. IMPRESSION: Diverticulosis of the colon with slight infiltrative changes around the cecum suggesting right colonic diverticulitis. Appendix is normal. No abscess. Multiple tiny nonobstructing stones in the kidneys. Low-attenuation lesions in the kidneys are difficult to characterize on noncontrast imaging but are likely cysts. Electronically Signed   By: Lucienne Capers M.D.   On: 06/21/2016 21:15    Lab Results  Component Value Date   WBC 10.0 06/21/2016   HGB 11.1 (L) 06/21/2016   HCT 34.7 (L) 06/21/2016   MCV 89.7 06/21/2016  PLT 345 06/21/2016    Lab Results  Component Value Date   CREATININE 3.49 (H) 06/21/2016   BUN 39 (H) 06/21/2016   NA 140 06/21/2016   K 4.2 06/21/2016   CL 106 06/21/2016   CO2 24 06/21/2016    Lab Results  Component Value Date   ALT 19 02/28/2016   AST 27 02/28/2016   ALKPHOS 78 02/28/2016   BILITOT 0.3 02/28/2016     Physical Exam: BP 124/70   Pulse 68   Ht 5\' 2"  (1.575 m)   Wt 123 lb (55.8 kg)   BMI 22.50 kg/m  Constitutional: Pleasant,well-developed, female in no acute distress. HEENT: Normocephalic and atraumatic. Conjunctivae are normal. No scleral icterus. Neck supple.  Cardiovascular: Normal rate, regular rhythm.  Pulmonary/chest: Effort normal and breath sounds normal. No wheezing, rales or rhonchi. Abdominal: Soft,  nondistended, mild RLQ TTP without rebound or guarding. There are no masses palpable. No hepatomegaly. Extremities: no edema Lymphadenopathy: No cervical adenopathy noted. Neurological: Alert and oriented to person place and time. Skin: Skin is warm and dry. No rashes noted. Psychiatric: Normal mood and affect. Behavior is normal.   ASSESSMENT AND PLAN: 76 year old female here for reassessment of the following issues:  Suspected diverticulitis / abnormal CT scan abdomen / nausea  - overall improved on antibiotics. CT showed cecal diverticulitis however she did not have diverticulosis in her cecum on the last colonoscopy and pictures of her colon appear normal in this area. It's possible she could've had a focal infectious colitis versus ischemic (much less likely), while colon polyp or growth could look like this but also less likely given her last colonoscopy result. She is extremely anxious about this given her family history of colon cancer and wants to have it evaluated. In this case I would recommend waiting at least 6 weeks and perform a colonoscopy to further evaluate. I discussed risks and benefits of colonoscopy with her and she wanted proceed. She will need to hold Plavix 5 days before the procedure but can continue aspirin or changed to regular dose aspirin if desired. Will send a message to her prescribing physician for approval. She'll complete her course of antibiotics, I offered her some Zofran to take for nausea in the setting of Flagyl which is causing GI upset. She agreed with the plan.Marland Kitchen She will continue her Citrucel interim.   Cotter Cellar, MD [facility] Pager [phone]

## 2016-06-30 ENCOUNTER — Telehealth: Payer: Self-pay | Admitting: Gastroenterology

## 2016-06-30 NOTE — Telephone Encounter (Signed)
Patient advised to take 1/2 tablet of flagyl, continue taking the Cipro. Let her know that the right sided pain should be getting better, and if it worsens she needs to let us know. Otherwise, she will be seen at her colonoscopy.

## 2016-06-30 NOTE — Telephone Encounter (Signed)
Okay she should complete course of Cipro. She is on 500mg  tablet of flagyl. Has she tried taking 1/2 tablet of this? She may tolerate 250mg  much better than 500mg  at a time. She can take 250mg  q 8 hours until done. The right sided pain may take some time to go away completely but as long as it continues to progress and improve that's okay. We will await her colonoscopy. If it recurs or gets worse she should contact us. Thanks

## 2016-06-30 NOTE — Telephone Encounter (Signed)
Patient will finish her Cipro tomorrow, but has only been able to tolerate taking 1/2 of her Flagyl prescription. She states it makes her feel "terrible, weak, arm/leg pain". She is trying to take care of her husband who just had a surgery, and she needs to be up and moving around, and when she takes it, she needs to stay in bed. States that the nausea was helped by the zofran. She still has right sided abdominal pain, it is better but not gone. Please advise.

## 2016-07-02 NOTE — Progress Notes (Signed)
Should be ok to hold Plavix x 5 days for endoscopy.

## 2016-07-02 NOTE — Progress Notes (Signed)
Sent note to Dr [name]

## 2016-07-04 ENCOUNTER — Telehealth: Payer: Self-pay

## 2016-07-04 NOTE — Telephone Encounter (Signed)
Pt informed

## 2016-07-04 NOTE — Telephone Encounter (Signed)
-----   Message from Manus Gunning, MD sent at 07/03/2016  7:52 AM EST ----- Mel Almond okay per cardiology to hold plavix 5 days prior to colonoscopy. Thanks  ----- Message ----- From: Larey Dresser, MD Sent: 07/02/2016   8:36 PM To: Manus Gunning, MD    ----- Message ----- From: Alphonzo Dublin, LPN Sent: 6/57/8469   2:17 PM To: Larey Dresser, MD  This is a patient of Dr Aundra Dubin - I haven't seen her ----- Message ----- From: Alphonzo Dublin, LPN Sent: 11/01/5282   9:37 AM To: Sherren Mocha, MD

## 2016-07-08 DIAGNOSIS — M1712 Unilateral primary osteoarthritis, left knee: Secondary | ICD-10-CM | POA: Diagnosis not present

## 2016-07-10 DIAGNOSIS — D631 Anemia in chronic kidney disease: Secondary | ICD-10-CM | POA: Diagnosis not present

## 2016-07-10 DIAGNOSIS — N189 Chronic kidney disease, unspecified: Secondary | ICD-10-CM | POA: Diagnosis not present

## 2016-07-10 DIAGNOSIS — N2581 Secondary hyperparathyroidism of renal origin: Secondary | ICD-10-CM | POA: Diagnosis not present

## 2016-07-10 DIAGNOSIS — I10 Essential (primary) hypertension: Secondary | ICD-10-CM | POA: Diagnosis not present

## 2016-07-10 DIAGNOSIS — D509 Iron deficiency anemia, unspecified: Secondary | ICD-10-CM | POA: Diagnosis not present

## 2016-07-10 DIAGNOSIS — N184 Chronic kidney disease, stage 4 (severe): Secondary | ICD-10-CM | POA: Diagnosis not present

## 2016-08-19 ENCOUNTER — Ambulatory Visit: Payer: Medicare Other | Admitting: Neurology

## 2016-08-19 DIAGNOSIS — I781 Nevus, non-neoplastic: Secondary | ICD-10-CM | POA: Diagnosis not present

## 2016-08-21 ENCOUNTER — Telehealth: Payer: Self-pay | Admitting: Gastroenterology

## 2016-08-21 ENCOUNTER — Ambulatory Visit (INDEPENDENT_AMBULATORY_CARE_PROVIDER_SITE_OTHER): Payer: Medicare Other | Admitting: Neurology

## 2016-08-21 ENCOUNTER — Telehealth: Payer: Self-pay | Admitting: *Deleted

## 2016-08-21 ENCOUNTER — Encounter: Payer: Self-pay | Admitting: Neurology

## 2016-08-21 VITALS — BP 150/78 | HR 64 | Ht 62.0 in | Wt 121.5 lb

## 2016-08-21 DIAGNOSIS — M545 Low back pain, unspecified: Secondary | ICD-10-CM

## 2016-08-21 DIAGNOSIS — M5481 Occipital neuralgia: Secondary | ICD-10-CM | POA: Diagnosis not present

## 2016-08-21 DIAGNOSIS — G8929 Other chronic pain: Secondary | ICD-10-CM | POA: Diagnosis not present

## 2016-08-21 MED ORDER — VENLAFAXINE HCL ER 37.5 MG PO CP24: 37.5000 mg | ORAL_CAPSULE | Freq: Every day | ORAL | 0 refills | Status: DC

## 2016-08-21 MED ORDER — VENLAFAXINE HCL ER 37.5 MG PO CP24: ORAL_CAPSULE | ORAL | 3 refills | Status: DC

## 2016-08-21 NOTE — Telephone Encounter (Signed)
Patient is scheduled for 4/24 for colonoscopy, she is concerned about taking the Suprep due to kidney issues. Please advise.

## 2016-08-21 NOTE — Telephone Encounter (Signed)
Received fax from [place] [facility]. Note from pharmacy: "Script clarification: insurance will not cover Venlafaxine ER 37.5 two a day. It says to use higher strength to take one per day. Probably will take 2 rx's to increase dose"

## 2016-08-21 NOTE — Telephone Encounter (Signed)
I called the patient. The insurance apparently will not cover Effexor taking 2 of the 37.5 mg tablets daily. I called the patient and told her to take 2 weeks of the 37.5 mg tablet, then call our office and I'll call in a 75 mg tablet.

## 2016-08-21 NOTE — Telephone Encounter (Signed)
Patient advised to stay hydrated while taking the Suprep, but that Dr. Havery Moros felt this would be the safer option. Patient reassured.

## 2016-08-21 NOTE — Telephone Encounter (Signed)
I think it is safe to use Suprep as long as she can maintain adequate hydration and drink enough fluids with it. She does not qualify for lower volume prep due to her CKD and Suprep would probably be the safest option for her. thanks

## 2016-08-21 NOTE — Progress Notes (Signed)
Reason for visit: Chronic pain  Katrina Yang is an 76 y.o. female  History of present illness:  Katrina Yang is a 76 year old right-handed white female with a history of chronic pain issues involving the neck, low back, and left leg and knee. The patient has left occipital discomfort, and some discomfort down the entire left side of the body to include the left leg. The patient claims that her main disability is involving degenerative arthritis affecting the left knee. She is limited on how far she can walk and what activities she does because of this. The patient has chronic renal insufficiency, she may be on hemodialysis within the next 2 years. The patient is on low-dose gabapentin taking 100 mg daily. She has not had any falls since last seen. She had an episode of diverticulitis in February 2018 requiring hospitalization. He is not sleeping well in part secondary to the left knee discomfort. She comes to this office for an evaluation.  Past Medical History:  Diagnosis Date  . Carotid bruit    a.  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  . Cervical spondylosis without myelopathy 02/01/2015  . CKD (chronic kidney disease), stage IV ([place])   . Colonic polyp    hx of  . CVA (cerebral vascular accident) ([place])    hx of  . Degenerative joint disease   . Diverticulosis of colon (without mention of hemorrhage)   . GERD (gastroesophageal reflux disease)   . History of hysterectomy   . Hypertension    a. Renal artery dopplers in 2012 with no evidence for significant renal artery stenosis.   . Occipital neuralgia of left side 08/30/2013  . Osteoporosis, unspecified   . Palpitations    a. Event monitor (8/14) with no significant abnormalities.   . Peripheral neuropathic pain   . Personal history of unspecified urinary disorder   . Pulmonary nodule   . Pure hypercholesterolemia   . Secondary hyperparathyroidism (of renal origin)   . Unspecified hypothyroidism     Past Surgical History:    Procedure Laterality Date  . ABDOMINAL HYSTERECTOMY    . BACK SURGERY     diskectomy L-5. Multiple back surgeries  . MENISCECTOMY Left 11/14/15    Family History  Problem Relation Age of Onset  . Kidney cancer Father   . Heart attack Father   . Heart disease Father   . Colon cancer Mother   . Ovarian cancer Mother   . Heart disease Mother   . Heart failure Mother   . Stomach cancer Neg Hx     Social history:  reports that she has never smoked. She has never used smokeless tobacco. She reports that she does not drink alcohol or use drugs.    Allergies  Allergen Reactions  . Iodinated Diagnostic Agents Swelling    Throat swelling  . Lovastatin     Unknown reaction  . Metronidazole Nausea And Vomiting  . Sulfonamide Derivatives     Unknown allergic reaction  . Codeine Rash  . Latex Rash    Medications:  Prior to Admission medications   Medication Sig Start Date End Date Taking? Authorizing Provider  amLODipine (NORVASC) 10 MG tablet Take 5 mg by mouth 2 (two) times daily.  06/19/15  Yes Historical Provider, MD  aspirin EC 81 MG tablet Take 81 mg by mouth daily.   Yes Historical Provider, MD  calcitRIOL (ROCALTROL) 0.25 MCG capsule TAKE 1 CAPSULE DAILY 03/30/16  Yes Renato Shin, MD  clopidogrel (PLAVIX) 75  MG tablet TAKE 1 TABLET (75 MG TOTAL) BY MOUTH DAILY. 04/23/16  Yes Kathrynn Ducking, MD  gabapentin (NEURONTIN) 100 MG capsule Take 1 capsule (100 mg total) by mouth daily. Patient taking differently: Take 100 mg by mouth at bedtime.  10/22/15  Yes Kathrynn Ducking, MD  levothyroxine (SYNTHROID, LEVOTHROID) 88 MCG tablet Take 1 tablet (88 mcg total) by mouth daily before breakfast. 09/20/15  Yes Renato Shin, MD  lidocaine (LIDODERM) 5 % Place 1 patch onto the skin daily as needed (pain). Remove & Discard patch within 12 hours or as directed by MD   Yes Historical Provider, MD  metoprolol tartrate (LOPRESSOR) 25 MG tablet Take 1 tablet (25 mg total) by mouth 2 (two) times  daily. 10/08/15  Yes Renato Shin, MD  rosuvastatin (CRESTOR) 10 MG tablet TAKE 1/2 TABLET EVERY DAY 04/23/16  Yes Renato Shin, MD  venlafaxine XR (EFFEXOR XR) 37.5 MG 24 hr capsule One tablet daily for 2 weeks, then take 2 tablets daily 08/21/16   Kathrynn Ducking, MD    ROS:  Out of a complete 14 system review of symptoms, the patient complains only of the following symptoms, and all other reviewed systems are negative.  Abdominal pain Frequent waking Joint pain, joint swelling, back pain, aching muscles, muscle cramps, walking difficulty, neck pain, neck stiffness Bruising easily Numbness, weakness  Blood pressure (!) 150/78, pulse 64, height 5\' 2"  (1.575 m), weight 121 lb 8 oz (55.1 kg), SpO2 97 %.  Physical Exam  General: The patient is alert and cooperative at the time of the examination.  Neuromuscular: Range of movement of the cervical spine lacks about 15 of full lateral rotation of the cervical spine bilaterally.  Skin: No significant peripheral edema is noted.   Neurologic Exam  Mental status: The patient is alert and oriented x 3 at the time of the examination. The patient has apparent normal recent and remote memory, with an apparently normal attention span and concentration ability.   Cranial nerves: Facial symmetry is present. Speech is normal, no aphasia or dysarthria is noted. Extraocular movements are full. Visual fields are full.  Motor: The patient has good strength in all 4 extremities.  Sensory examination: Soft touch sensation is symmetric on the face, arms, and legs.  Coordination: The patient has good finger-nose-finger and heel-to-shin bilaterally.  Gait and station: The patient has a limping gait on the left leg. Romberg is negative. No drift is seen.  Reflexes: Deep tendon reflexes are symmetric.   Assessment/Plan:  1. Chronic pain, left body  2. Degenerative arthritis, left knee  The main deficit that the patient has noted is significant  pain in the left knee that prevents her from ambulating longer distances. She also has discomfort down the left side of the body. We will give her trial on low-dose Effexor, she is to call for any dose adjustments. She will follow-up in 6 months.  Jill Alexanders MD 08/21/2016 9:23 AM  [facility] [address] [place],  [address]  Phone [phone] Fax [phone]

## 2016-08-26 ENCOUNTER — Ambulatory Visit (AMBULATORY_SURGERY_CENTER): Payer: Medicare Other | Admitting: Gastroenterology

## 2016-08-26 ENCOUNTER — Encounter: Payer: Self-pay | Admitting: Gastroenterology

## 2016-08-26 VITALS — BP 135/69 | HR 68 | Temp 97.1°F | Resp 14 | Ht 62.0 in | Wt 121.0 lb

## 2016-08-26 DIAGNOSIS — K5730 Diverticulosis of large intestine without perforation or abscess without bleeding: Secondary | ICD-10-CM

## 2016-08-26 DIAGNOSIS — I10 Essential (primary) hypertension: Secondary | ICD-10-CM | POA: Diagnosis not present

## 2016-08-26 DIAGNOSIS — Z86010 Personal history of colonic polyps: Secondary | ICD-10-CM | POA: Diagnosis not present

## 2016-08-26 DIAGNOSIS — R935 Abnormal findings on diagnostic imaging of other abdominal regions, including retroperitoneum: Secondary | ICD-10-CM | POA: Diagnosis not present

## 2016-08-26 DIAGNOSIS — N184 Chronic kidney disease, stage 4 (severe): Secondary | ICD-10-CM | POA: Diagnosis not present

## 2016-08-26 DIAGNOSIS — K5732 Diverticulitis of large intestine without perforation or abscess without bleeding: Secondary | ICD-10-CM | POA: Diagnosis not present

## 2016-08-26 MED ORDER — SODIUM CHLORIDE 0.9 % IV SOLN: 500.0000 mL | INTRAVENOUS | Status: DC

## 2016-08-26 NOTE — Patient Instructions (Signed)

## 2016-08-26 NOTE — Progress Notes (Signed)
To PACU, Awake and alert. Report to RN

## 2016-08-26 NOTE — Op Note (Signed)
[place] Patient Name: Katrina Yang Procedure Date: 08/26/2016 1:32 PM MRN: [id number] Endoscopist: Remo Lipps P. Devery Murgia MD, MD Age: 76 Referring MD:  Date of Birth: 20-Feb-1941 Gender: Female Account #: [id number] Procedure:                Colonoscopy Indications:              Abnormal CT of the GI tract (reported history of                            cecal diverticulitis) Medicines:                Monitored Anesthesia Care Procedure:                Pre-Anesthesia Assessment:                           - Prior to the procedure, a History and Physical                            was performed, and patient medications and                            allergies were reviewed. The patient's tolerance of                            previous anesthesia was also reviewed. The risks                            and benefits of the procedure and the sedation                            options and risks were discussed with the patient.                            All questions were answered, and informed consent                            was obtained. Prior Anticoagulants: The patient has                            taken Plavix (clopidogrel), last dose was 5 days                            prior to procedure. ASA Grade Assessment: III - A                            patient with severe systemic disease. After                            reviewing the risks and benefits, the patient was                            deemed in satisfactory condition to undergo the  procedure.                           After obtaining informed consent, the colonoscope                            was passed under direct vision. Throughout the                            procedure, the patient's blood pressure, pulse, and                            oxygen saturations were monitored continuously. The                            Colonoscope was introduced through the anus and             advanced to the the terminal ileum, with                            identification of the appendiceal orifice and IC                            valve. The colonoscopy was performed without                            difficulty. The patient tolerated the procedure                            well. The quality of the bowel preparation was                            good. The terminal ileum, ileocecal valve,                            appendiceal orifice, and rectum were photographed. Scope In: 1:36:35 PM Scope Out: 1:52:29 PM Scope Withdrawal Time: 0 hours 12 minutes 17 seconds  Total Procedure Duration: 0 hours 15 minutes 54 seconds  Findings:                 The perianal and digital rectal examinations were                            normal.                           Many medium-mouthed diverticula were found in the                            sigmoid colon, descending colon, transverse colon                            and ascending colon. No diverticulum were noted in                            the cecum but were  located in the proximal                            ascending colon.                           The terminal ileum appeared normal.                           Internal hemorrhoids were found during retroflexion.                           The exam was otherwise without abnormality. Complications:            No immediate complications. Estimated blood loss:                            None. Estimated Blood Loss:     Estimated blood loss: none. Impression:               - Diverticulosis in the sigmoid colon, in the                            descending colon, in the transverse colon and in                            the ascending colon. None in the cecum but perhaps                            CT changes could have reflected proximal ascending                            colon diverticulitis. No polyps or mass lesions                            noted.                            - The examined portion of the ileum was normal.                           - Internal hemorrhoids.                           - The examination was otherwise normal. Recommendation:           - Patient has a contact number available for                            emergencies. The signs and symptoms of potential                            delayed complications were discussed with the                            patient. Return to normal activities tomorrow.  Written discharge instructions were provided to the                            patient.                           - Resume previous diet.                           - Continue present medications.                           - Follow up as needed if symptoms recur Remo Lipps P. Sheriann Newmann MD, MD 08/26/2016 1:59:41 PM This report has been signed electronically.

## 2016-08-27 ENCOUNTER — Telehealth: Payer: Self-pay

## 2016-08-27 NOTE — Telephone Encounter (Signed)
  Follow up Call-  Call back number 08/26/2016  Post procedure Call Back phone  # [phone]  Permission to leave phone message Yes  Some recent data might be hidden     Left message

## 2016-08-27 NOTE — Telephone Encounter (Signed)
  Follow up Call-  Call back number 08/26/2016  Post procedure Call Back phone  # [phone]  Permission to leave phone message Yes  Some recent data might be hidden     Left message

## 2016-09-03 ENCOUNTER — Other Ambulatory Visit: Payer: Self-pay | Admitting: Endocrinology

## 2016-09-12 DIAGNOSIS — M25562 Pain in left knee: Secondary | ICD-10-CM | POA: Diagnosis not present

## 2016-09-12 DIAGNOSIS — M1712 Unilateral primary osteoarthritis, left knee: Secondary | ICD-10-CM | POA: Diagnosis not present

## 2016-09-12 DIAGNOSIS — G8929 Other chronic pain: Secondary | ICD-10-CM | POA: Diagnosis not present

## 2016-09-22 ENCOUNTER — Other Ambulatory Visit: Payer: Self-pay | Admitting: Endocrinology

## 2016-10-02 ENCOUNTER — Other Ambulatory Visit: Payer: Self-pay | Admitting: Endocrinology

## 2016-10-02 DIAGNOSIS — N184 Chronic kidney disease, stage 4 (severe): Secondary | ICD-10-CM

## 2016-10-03 ENCOUNTER — Telehealth: Payer: Self-pay | Admitting: Endocrinology

## 2016-10-03 NOTE — Telephone Encounter (Signed)
She wants closer to home

## 2016-10-03 NOTE — Telephone Encounter (Signed)
Amber from [facility] would like to know why the patient is being seen, the patient already sees a Nephrologist in [place]. Please advise.

## 2016-10-07 NOTE — Telephone Encounter (Signed)
Called and notified [facility].

## 2016-10-09 DIAGNOSIS — H2513 Age-related nuclear cataract, bilateral: Secondary | ICD-10-CM | POA: Diagnosis not present

## 2016-10-20 ENCOUNTER — Other Ambulatory Visit: Payer: Self-pay | Admitting: Neurology

## 2016-10-28 DIAGNOSIS — N189 Chronic kidney disease, unspecified: Secondary | ICD-10-CM | POA: Diagnosis not present

## 2016-10-28 DIAGNOSIS — I10 Essential (primary) hypertension: Secondary | ICD-10-CM | POA: Diagnosis not present

## 2016-10-28 DIAGNOSIS — N184 Chronic kidney disease, stage 4 (severe): Secondary | ICD-10-CM | POA: Diagnosis not present

## 2016-10-28 DIAGNOSIS — N2581 Secondary hyperparathyroidism of renal origin: Secondary | ICD-10-CM | POA: Diagnosis not present

## 2016-10-28 DIAGNOSIS — D631 Anemia in chronic kidney disease: Secondary | ICD-10-CM | POA: Diagnosis not present

## 2016-10-28 DIAGNOSIS — D509 Iron deficiency anemia, unspecified: Secondary | ICD-10-CM | POA: Diagnosis not present

## 2016-11-03 ENCOUNTER — Encounter: Payer: Self-pay | Admitting: Endocrinology

## 2016-11-03 ENCOUNTER — Ambulatory Visit (INDEPENDENT_AMBULATORY_CARE_PROVIDER_SITE_OTHER): Payer: Medicare Other | Admitting: Endocrinology

## 2016-11-03 DIAGNOSIS — L03011 Cellulitis of right finger: Secondary | ICD-10-CM

## 2016-11-03 DIAGNOSIS — L03019 Cellulitis of unspecified finger: Secondary | ICD-10-CM | POA: Insufficient documentation

## 2016-11-03 MED ORDER — CEPHALEXIN 250 MG PO CAPS: 250.0000 mg | ORAL_CAPSULE | Freq: Three times a day (TID) | ORAL | 0 refills | Status: DC

## 2016-11-03 MED ORDER — TERBINAFINE HCL 250 MG PO TABS: 250.0000 mg | ORAL_TABLET | Freq: Every day | ORAL | 0 refills | Status: DC

## 2016-11-03 NOTE — Progress Notes (Signed)
Subjective:    Patient ID: Katrina Yang, female    DOB: June 20, 1940, 76 y.o.   MRN: [id number]  HPI Pt states 6 mos of "roughness" of the right middle fingernail.  She has intermitt assoc paronychial swell/redness/pain.  Past Medical History:  Diagnosis Date  . Carotid bruit    a.  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  . Cervical spondylosis without myelopathy 02/01/2015  . CKD (chronic kidney disease), stage IV ([place])   . Colonic polyp    hx of  . CVA (cerebral vascular accident) ([place])    hx of  . Degenerative joint disease   . Diverticulosis of colon (without mention of hemorrhage)   . GERD (gastroesophageal reflux disease)   . History of hysterectomy   . Hypertension    a. Renal artery dopplers in 2012 with no evidence for significant renal artery stenosis.   . Occipital neuralgia of left side 08/30/2013  . Osteoporosis, unspecified   . Palpitations    a. Event monitor (8/14) with no significant abnormalities.   . Peripheral neuropathic pain   . Personal history of unspecified urinary disorder   . Pulmonary nodule   . Pure hypercholesterolemia   . Secondary hyperparathyroidism (of renal origin)   . Unspecified hypothyroidism     Past Surgical History:  Procedure Laterality Date  . ABDOMINAL HYSTERECTOMY    . BACK SURGERY     diskectomy L-5. Multiple back surgeries  . MENISCECTOMY Left 11/14/15    Social History   Social History  . Marital status: Married    Spouse name: Katrina Yang  . Number of children: 1  . Years of education: 2   Occupational History  . 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 Smoking status: Never Smoker  . Smokeless tobacco: Never Used  . Alcohol use No  . Drug use: No  . Sexual activity: Not on file   Other Topics Concern  . Not on file   Social History Narrative   Pt lives at home with her spouse.   Patient drinks 1 glass of tea every other day.   Patient is right handed.    Current  Outpatient Prescriptions on File Prior to Visit  Medication Sig Dispense Refill  . amLODipine (NORVASC) 10 MG tablet Take 5 mg by mouth 2 (two) times daily.     Marland Kitchen aspirin EC 81 MG tablet Take 81 mg by mouth daily.    . calcitRIOL (ROCALTROL) 0.25 MCG capsule TAKE 1 CAPSULE DAILY 90 capsule 2  . clopidogrel (PLAVIX) 75 MG tablet TAKE 1 TABLET (75 MG TOTAL) BY MOUTH DAILY. 90 tablet 2  . gabapentin (NEURONTIN) 100 MG capsule TAKE 1 CAPSULE (100 MG TOTAL) BY MOUTH DAILY. 90 capsule 3  . levothyroxine (SYNTHROID, LEVOTHROID) 88 MCG tablet TAKE 1 TABLET (88 MCG TOTAL) BY MOUTH DAILY BEFORE BREAKFAST. 90 tablet 3  . lidocaine (LIDODERM) 5 % Place 1 patch onto the skin daily as needed (pain). Remove & Discard patch within 12 hours or as directed by MD    . metoprolol tartrate (LOPRESSOR) 25 MG tablet TAKE 1 TABLET (25 MG TOTAL) BY MOUTH 2 (TWO) TIMES DAILY. 180 tablet 3  . rosuvastatin (CRESTOR) 10 MG tablet TAKE 1/2 TABLET EVERY DAY 90 tablet 2   Current Facility-Administered Medications on File Prior to Visit  Medication Dose Route Frequency Provider Last Rate Last Dose  . 0.9 %  sodium chloride infusion  500 mL Intravenous Continuous  Armbruster, Renelda Loma, MD        Allergies  Allergen Reactions  . Iodinated Diagnostic Agents Swelling    Throat swelling  . Lovastatin     Unknown reaction  . Metronidazole Nausea And Vomiting  . Sulfonamide Derivatives     Unknown allergic reaction  . Codeine Rash  . Latex Rash    Family History  Problem Relation Age of Onset  . Kidney cancer Father   . Heart attack Father   . Heart disease Father   . Colon cancer Mother   . Ovarian cancer Mother   . Heart disease Mother   . Heart failure Mother   . Stomach cancer Neg Hx     BP 122/78   Pulse 67   Ht 5\' 2"  (1.575 m)   Wt 120 lb (54.4 kg)   SpO2 97%   BMI 21.95 kg/m    Review of Systems Denies fever.  She has persistent nodule at the left wrist, since injury a few weeks ago.        Objective:   Physical Exam VITAL SIGNS:  See vs page. GENERAL: no distress. Right middle finger: onychomycosis.  At the paronychial area, there is erythema/swelling/tenderness Left wrist: minimal ecchymosis.  There is a 1 cm ganglion cyst at the extensor aspect.     Assessment & Plan:  Ganglion cyst, posttraumatic. I told pt this needs no rx.  Onychomycosis. Paronychial infection, new  Patient Instructions  I have sent 2 prescriptions to your pharmacy: for the antibiotic, and 1 against nail fungus. I'll see you next time.

## 2016-11-03 NOTE — Patient Instructions (Signed)
I have sent 2 prescriptions to your pharmacy: for the antibiotic, and 1 against nail fungus. I'll see you next time.

## 2016-11-18 DIAGNOSIS — G8929 Other chronic pain: Secondary | ICD-10-CM | POA: Diagnosis not present

## 2016-11-18 DIAGNOSIS — M25562 Pain in left knee: Secondary | ICD-10-CM | POA: Diagnosis not present

## 2016-11-18 DIAGNOSIS — M1712 Unilateral primary osteoarthritis, left knee: Secondary | ICD-10-CM | POA: Diagnosis not present

## 2016-11-27 ENCOUNTER — Ambulatory Visit (INDEPENDENT_AMBULATORY_CARE_PROVIDER_SITE_OTHER): Payer: Medicare Other | Admitting: Physician Assistant

## 2016-11-27 ENCOUNTER — Encounter: Payer: Self-pay | Admitting: Physician Assistant

## 2016-11-27 VITALS — BP 110/70 | HR 63 | Ht 62.0 in | Wt 123.4 lb

## 2016-11-27 DIAGNOSIS — I10 Essential (primary) hypertension: Secondary | ICD-10-CM

## 2016-11-27 DIAGNOSIS — R002 Palpitations: Secondary | ICD-10-CM | POA: Diagnosis not present

## 2016-11-27 DIAGNOSIS — I4510 Unspecified right bundle-branch block: Secondary | ICD-10-CM

## 2016-11-27 DIAGNOSIS — I779 Disorder of arteries and arterioles, unspecified: Secondary | ICD-10-CM | POA: Diagnosis not present

## 2016-11-27 DIAGNOSIS — N184 Chronic kidney disease, stage 4 (severe): Secondary | ICD-10-CM | POA: Diagnosis not present

## 2016-11-27 DIAGNOSIS — E785 Hyperlipidemia, unspecified: Secondary | ICD-10-CM | POA: Diagnosis not present

## 2016-11-27 DIAGNOSIS — I739 Peripheral vascular disease, unspecified: Secondary | ICD-10-CM

## 2016-11-27 NOTE — Patient Instructions (Addendum)
Medication Instructions:  Your physician recommends that you continue on your current medications as directed. Please refer to the Current Medication list given to you today.   Labwork: None ordered  Testing/Procedures: Your physician has requested that you have an echocardiogram. Echocardiography is a painless test that uses sound waves to create images of your heart. It provides your doctor with information about the size and shape of your heart and how well your heart's chambers and valves are working. This procedure takes approximately one hour. There are no restrictions for this procedure.    Follow-Up: You have been referred to EP cardiology for loop recorder consideration   Your physician wants you to follow-up in: 6 months with Dr.Cooper  You will receive a reminder letter in the mail two months in advance. If you don't receive a letter, please call our office to schedule the follow-up appointment.   Any Other Special Instructions Will Be Listed Below (If Applicable).     If you need a refill on your cardiac medications before your next appointment, please call your pharmacy.

## 2016-11-27 NOTE — Progress Notes (Signed)
Cardiology Office Note    Date:  11/27/2016  ID:  [name], DOB 1941/04/09, MRN [id number] PCP:  Renato Shin, MD  Cardiologist: Previously Dr. Aundra Dubin (Dr. Burt Knack agreed to assume care in the future as patient's husband sees him) Nephrologist:  Dr. Felicity Pellegrini at [facility]   Chief Complaint: yearly follow-up, palpitations  History of Present Illness:  Katrina Yang is a 76 y.o. female with history of labile HTN (renal dopplers 2012 without evidence for RAS), hypothyroidism, CVA and multiple prior TIAs (on ASA/Plavix), CKD stage IV, secondary hyperparathyroidism, HLD, GERD, L carotid bruit (Carotid duplex 2017 with mild BICA stenosis), peripheral neuropathy who presents for f/u of palpitations. Last 2D echo 08/2013: EF 55-60%, grade 1 DD, borderline elevated LV filling pressure. She has previously followed with Dr. Aundra Dubin for "weak spells" of uncertain etiology. No significant arrhythmia on cardiac monitor in 8/14 (though she only wore it for a few days). She had a lot of trouble tolerating the event monitor. I saw her in 11/2015 at which time she was reporting more palpitations described as a fast fluttering sensation. Trial of repeat monitor advised but she declined. Last labs in Epic 06/2016 showed Cr 3.49 (increased from 2017), K 4.2, Hgb 11.1. TSH in 02/2016 wnl. Carotid duplex 08/2015 - 1-39% stenosis, f/u due only PRN.  She presents back for follow-up. Overall over the last year she's been doing well. She does continue to have palpitations. They are less frequent but still occur. Last year they were more of a fast fluttering, this year they have felt like a pause in her heartbeat. No CP or SOB. No significant LEE. No syncope. She reports prior history of 1 stroke and 4 TIAs. She has been following with nephrology at [facility] and may eventually require going on dialysis. Husband Sharonda Llamas sees Dr. Burt Knack.    Past Medical History:  Diagnosis Date  . Carotid bruit    a.  Duplex  08/2015: stable 1-39% stenosis.  . Cervical spondylosis without myelopathy 02/01/2015  . CKD (chronic kidney disease), stage IV ([place])   . Colonic polyp    hx of  . CVA (cerebral vascular accident) ([place])    hx of  . Degenerative joint disease   . Diverticulosis of colon (without mention of hemorrhage)   . GERD (gastroesophageal reflux disease)   . History of hysterectomy   . Hypertension    a. Renal artery dopplers in 2012 with no evidence for significant renal artery stenosis.   . Occipital neuralgia of left side 08/30/2013  . Osteoporosis, unspecified   . Palpitations    a. Event monitor (8/14) with no significant abnormalities.   . Peripheral neuropathic pain   . Personal history of unspecified urinary disorder   . Pulmonary nodule   . Pure hypercholesterolemia   . Secondary hyperparathyroidism (of renal origin)   . Unspecified hypothyroidism     Past Surgical History:  Procedure Laterality Date  . ABDOMINAL HYSTERECTOMY    . BACK SURGERY     diskectomy L-5. Multiple back surgeries  . MENISCECTOMY Left 11/14/15    Current Medications: Current Meds  Medication Sig  . amLODipine (NORVASC) 10 MG tablet Take 5 mg by mouth 2 (two) times daily.   Marland Kitchen aspirin EC 81 MG tablet Take 81 mg by mouth daily.  . calcitRIOL (ROCALTROL) 0.25 MCG capsule TAKE 1 CAPSULE DAILY  . clopidogrel (PLAVIX) 75 MG tablet TAKE 1 TABLET (75 MG TOTAL) BY MOUTH DAILY.  Marland Kitchen gabapentin (NEURONTIN) 100  MG capsule TAKE 1 CAPSULE (100 MG TOTAL) BY MOUTH DAILY.  Marland Kitchen levothyroxine (SYNTHROID, LEVOTHROID) 88 MCG tablet TAKE 1 TABLET (88 MCG TOTAL) BY MOUTH DAILY BEFORE BREAKFAST.  Marland Kitchen lidocaine (LIDODERM) 5 % Place 1 patch onto the skin daily as needed (pain). Remove & Discard patch within 12 hours or as directed by MD  . metoprolol tartrate (LOPRESSOR) 25 MG tablet TAKE 1 TABLET (25 MG TOTAL) BY MOUTH 2 (TWO) TIMES DAILY.  . metoprolol-hydrochlorothiazide (LOPRESSOR HCT) 50-25 MG tablet Take 1 tablet by mouth 2 (two)  times daily.  . rosuvastatin (CRESTOR) 10 MG tablet TAKE 1/2 TABLET EVERY DAY   Current Facility-Administered Medications for the 11/27/16 encounter (Office Visit) with Charlie Pitter, PA-C  Medication  . 0.9 %  sodium chloride infusion     Allergies:   Iodinated diagnostic agents; Metronidazole; Latex; Sulfonamide derivatives; Codeine; Lovastatin; Sulfa antibiotics; and Sulfamethoxazole-trimethoprim   Social History   Social History  . Marital status: Married    Spouse name: [name]  . Number of children: 1  . Years of education: 2   Occupational History  . 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 Smoking status: Never Smoker  . Smokeless tobacco: Never Used  . Alcohol use No  . Drug use: No  . Sexual activity: Not Asked   Other Topics Concern  . None   Social History Narrative   Pt lives at home with her spouse.   Patient drinks 1 glass of tea every other day.   Patient is right handed.     Family History:  Family History  Problem Relation Age of Onset  . Kidney cancer Father   . Heart attack Father   . Heart disease Father   . Colon cancer Mother   . Ovarian cancer Mother   . Heart disease Mother   . Heart failure Mother   . Stomach cancer Neg Hx    ROS:   Please see the history of present illness.  All other systems are reviewed and otherwise negative.    PHYSICAL EXAM:   VS:  BP 110/70   Pulse 63   Ht 5\' 2"  (1.575 m)   Wt 123 lb 6.4 oz (56 kg)   SpO2 98%   BMI 22.57 kg/m   BMI: Body mass index is 22.57 kg/m. GEN: Well nourished, well developed WF, in no acute distress  HEENT: normocephalic, atraumatic Neck: no JVD, carotid bruits, or masses Cardiac: RRR; no murmurs, rubs, or gallops, no edema  Respiratory:  clear to auscultation bilaterally, normal work of breathing GI: soft, nontender, nondistended, + BS MS: no deformity or atrophy  Skin: warm and dry, no rash Neuro:  Alert and Oriented x 3, Strength and  sensation are intact, follows commands Psych: euthymic mood, full affect  Wt Readings from Last 3 Encounters:  11/27/16 123 lb 6.4 oz (56 kg)  11/03/16 120 lb (54.4 kg)  08/26/16 121 lb (54.9 kg)      Studies/Labs Reviewed:   EKG:  EKG was ordered today and personally reviewed by me and demonstrates NSR 63bpm new RBBB, nonspecific ST-T Changes   Recent Labs: 02/28/2016: ALT 19; TSH 2.65 06/21/2016: BUN 39; Creatinine, Ser 3.49; Hemoglobin 11.1; Platelets 345; Potassium 4.2; Sodium 140   Lipid Panel    Component Value Date/Time   CHOL 214 (H) 02/28/2016 0900   TRIG 62.0 02/28/2016 0900   HDL 106.10 02/28/2016 0900   CHOLHDL 2 02/28/2016 0900  VLDL 12.4 02/28/2016 0900   LDLCALC 96 02/28/2016 0900   LDLDIRECT 107.3 02/14/2013 0843    Additional studies/ records that were reviewed today include: Summarized above   ASSESSMENT & PLAN:   1. Palpitations - she continues with occasional palpitations, has not previously tolerated wearing event monitor. Her previous palpitations were concerning for a tachyarrhythmia; now she is describing more of a pause in her heartbeat. Difficult to know whether this represents an actual pause, conversion pause or compensatory pause following ectopy. I brought her case to Dr. Caryl Comes today given her history of multiple cerebrovascular events, raising question of whether she would benefit from a loop recorder not only to finally formally evaluate these palpitations but to r/o occult AF. He feels this is completely reasonable, and so I have referred to EP. 2. Labile HTN - controlled today. 3. CKD stage IV - followed at [facility]. 4. Hyperlipidemia on Crestor - followed by PCP. 5. Carotid artery disease - further f/u will be at discretion of primary cardiologist, minimal by study in 2017. 6. RBBB - not previously seen. She's not describing any pre-syncope or syncope, but as above, has noticed some palpitations. The patient states she may need to go on  dialysis in the future and is concerned her heart muscle is strong enough for this as her mother had cardiomyopathy and could not tolerate HD. Will update echo to exclude structural change.  Disposition: Dr. Aundra Dubin is now doing advanced HF only. The patient's husband follows with Dr. Burt Knack and asked if he would be willing to see her as patient. I d/w Dr. Burt Knack in clinic and he is agreeable. Husband sees Dr. Burt Knack next month but Ms. Mcquitty does not need to be seen again quite so soon. Will arrange f/u in about 6 months to establish care.   Medication Adjustments/Labs and Tests Ordered: Current medicines are reviewed at length with the patient today.  Concerns regarding medicines are outlined above. Medication changes, Labs and Tests ordered today are summarized above and listed in the Patient Instructions accessible in Encounters.   Signed, Charlie Pitter, PA-C  11/27/2016 3:55 PM    [facility] [place], [place], [place]  [address] Phone: [phone]; Fax: [phone]

## 2016-12-01 ENCOUNTER — Other Ambulatory Visit: Payer: Self-pay

## 2016-12-01 ENCOUNTER — Telehealth: Payer: Self-pay | Admitting: Gastroenterology

## 2016-12-01 MED ORDER — AMOXICILLIN-POT CLAVULANATE 500-125 MG PO TABS: 1.0000 | ORAL_TABLET | Freq: Two times a day (BID) | ORAL | 0 refills | Status: DC

## 2016-12-01 NOTE — Telephone Encounter (Signed)
Spoke to patient to let her know that she can pick up her antibiotic at her pharmacy. She understands to let us know if this doesn't help.

## 2016-12-01 NOTE — Telephone Encounter (Signed)
Patient states that for last 3 days she's been having a "diverticulitis attack", pain located RLQ, feels warm but has not taken her temperature. She did have one bm in last 3 days, denies blood in the stool. Just doesn't feel good, poor appetite. Please advise.

## 2016-12-01 NOTE — Telephone Encounter (Signed)
If she feels like this is c/w her prior diverticulitis, we should treat her empirically for it. She had a reaction to flagyl the last time she had it. Why don't we try Augmentin for one week. I noted she has stage 4 CKD - in this light will need to give her low dose - please prescribe her the short acting Augmentin, 500mg  twice daily, for one week. Hopefully this works for her, if not, she should contact us. Thanks

## 2016-12-08 ENCOUNTER — Other Ambulatory Visit: Payer: Self-pay

## 2016-12-08 ENCOUNTER — Ambulatory Visit (HOSPITAL_COMMUNITY): Payer: Medicare Other | Attending: Internal Medicine

## 2016-12-08 DIAGNOSIS — N189 Chronic kidney disease, unspecified: Secondary | ICD-10-CM | POA: Diagnosis not present

## 2016-12-08 DIAGNOSIS — I4510 Unspecified right bundle-branch block: Secondary | ICD-10-CM | POA: Insufficient documentation

## 2016-12-08 DIAGNOSIS — Z8673 Personal history of transient ischemic attack (TIA), and cerebral infarction without residual deficits: Secondary | ICD-10-CM | POA: Diagnosis not present

## 2016-12-08 DIAGNOSIS — I129 Hypertensive chronic kidney disease with stage 1 through stage 4 chronic kidney disease, or unspecified chronic kidney disease: Secondary | ICD-10-CM | POA: Diagnosis not present

## 2016-12-09 ENCOUNTER — Encounter: Payer: Self-pay | Admitting: Internal Medicine

## 2016-12-09 ENCOUNTER — Ambulatory Visit (INDEPENDENT_AMBULATORY_CARE_PROVIDER_SITE_OTHER): Payer: Medicare Other | Admitting: Internal Medicine

## 2016-12-09 VITALS — BP 122/74 | HR 66 | Ht 61.5 in | Wt 122.4 lb

## 2016-12-09 DIAGNOSIS — R002 Palpitations: Secondary | ICD-10-CM | POA: Diagnosis not present

## 2016-12-09 NOTE — Progress Notes (Signed)
ELECTROPHYSIOLOGY CONSULT NOTE  Patient ID: Katrina Yang, MRN: [id number], DOB/AGE: 75-Oct-1942 75 y.o. Admit date: (Not on file) Date of Consult: 12/09/2016  Primary Physician: Renato Shin, MD Primary Cardiologist: [name] is a 76 y.o. female who is being seen today for the evaluation of loop  at the request of [facility] and D Dunn.    HPI Katrina Yang is a 76 y.o. female  Referred for consideration of implantable loop recorder.  She has 2 potential indications. She has had a number was recently he thinks 2006. Reviewing the notes from neurology as recently as 2014 and the MRI scan it was not clear whether thromboembolism was a potential mechanisms.   She does however have irregular palpitations. These come in 2 varieties, 1's fasting irregular and the second feels like the heart is stopping.    She has failed on a number of occasions to be able to wear a monitor because of skin irritation related to the neuropathic pain  Her walking is limited mostly by leg pain and residual from her stroke. She does not have orthopnea nocturnal dyspnea or peripheral edema. She's had no syncope  She has been managed with a combination of aspirin and Plavix  4/14 MRI abnormal-chronic microvascular ischemia 4/17 carotid Dopplers less than 39% obstruction 8/18 Echo EF normal with normal left atrial size  Other medical issues include class for kidney disease. She has reflux hyperlipidemia and a peripheral neuropathy.   Past Medical History:  Diagnosis Date  . Carotid bruit    a.  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  . Cervical spondylosis without myelopathy 02/01/2015  . CKD (chronic kidney disease), stage IV ([place])   . Colonic polyp    hx of  . CVA (cerebral vascular accident) ([place])    hx of  . Degenerative joint disease   . Diverticulosis of colon (without mention of hemorrhage)   . GERD (gastroesophageal reflux disease)   . History of hysterectomy   . Hypertension     a. Renal artery dopplers in 2012 with no evidence for significant renal artery stenosis.   . Occipital neuralgia of left side 08/30/2013  . Osteoporosis, unspecified   . Palpitations    a. Event monitor (8/14) with no significant abnormalities.   . Peripheral neuropathic pain   . Personal history of unspecified urinary disorder   . Pulmonary nodule   . Pure hypercholesterolemia   . Secondary hyperparathyroidism (of renal origin)   . Unspecified hypothyroidism       Surgical History:  Past Surgical History:  Procedure Laterality Date  . ABDOMINAL HYSTERECTOMY    . BACK SURGERY     diskectomy L-5. Multiple back surgeries  . MENISCECTOMY Left 11/14/15     Home Meds: Prior to Admission medications   Medication Sig Start Date End Date Taking? Authorizing Provider  amLODipine (NORVASC) 10 MG tablet Take 5 mg by mouth 2 (two) times daily.  06/19/15  Yes [provider]  amoxicillin-clavulanate (AUGMENTIN) 500-125 MG tablet Take 1 tablet (500 mg total) by mouth 2 (two) times daily. 12/01/16  Yes Armbruster, Renelda Loma, MD  aspirin EC 81 MG tablet Take 81 mg by mouth daily.   Yes [provider]  calcitRIOL (ROCALTROL) 0.25 MCG capsule Take 0.25 mcg by mouth daily.   Yes [provider]  clopidogrel (PLAVIX) 75 MG tablet TAKE 1 TABLET (75 MG TOTAL) BY MOUTH DAILY. 04/23/16  Yes Kathrynn Ducking, MD  gabapentin (NEURONTIN) 100 MG capsule TAKE 1 CAPSULE (100 MG TOTAL) BY MOUTH DAILY. 10/20/16  Yes Kathrynn Ducking, MD  levothyroxine (SYNTHROID, LEVOTHROID) 88 MCG tablet TAKE 1 TABLET (88 MCG TOTAL) BY MOUTH DAILY BEFORE BREAKFAST. 09/03/16  Yes Renato Shin, MD  lidocaine (LIDODERM) 5 % Place 1 patch onto the skin daily as needed (pain). Remove & Discard patch within 12 hours or as directed by MD   Yes [provider]  metoprolol tartrate (LOPRESSOR) 25 MG tablet TAKE 1 TABLET (25 MG TOTAL) BY MOUTH 2 (TWO) TIMES DAILY. 09/22/16  Yes Renato Shin, MD    rosuvastatin (CRESTOR) 10 MG tablet Take 5 mg by mouth daily.   Yes [provider]    Allergies:  Allergies  Allergen Reactions  . Iodinated Diagnostic Agents Swelling and Anaphylaxis    Throat swelling  . Metronidazole Nausea And Vomiting  . Latex Rash  . Sulfonamide Derivatives     Unknown allergic reaction  . Codeine Rash and Nausea And Vomiting  . Lovastatin Nausea Only    Unknown reaction  . Sulfa Antibiotics Rash  . Sulfamethoxazole-Trimethoprim Nausea Only    Social History   Social History  . Marital status: Married    Spouse name: [name]  . Number of children: 1  . Years of education: 2   Occupational History  . 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 Smoking status: Never Smoker  . Smokeless tobacco: Never Used  . Alcohol use No  . Drug use: No  . Sexual activity: Not on file   Other Topics Concern  . Not on file   Social History Narrative   Pt lives at home with her spouse.   Patient drinks 1 glass of tea every other day.   Patient is right handed.     Family History  Problem Relation Age of Onset  . Kidney cancer Father   . Heart attack Father   . Heart disease Father   . Colon cancer Mother   . Ovarian cancer Mother   . Heart disease Mother   . Heart failure Mother   . Stomach cancer Neg Hx      ROS:  Please see the history of present illness.     All other systems reviewed and negative.    Physical Exam:  Blood pressure 122/74, pulse 66, height 5' 1.5" (1.562 m), weight 122 lb 6.4 oz (55.5 kg), SpO2 99 %. General: Well developed, well nourished female in no acute distress. Head: Normocephalic, atraumatic, sclera non-icteric, no xanthomas, nares are without discharge. EENT: normal  Lymph Nodes:  none Neck: Negative for carotid bruits. JVD not elevated. Back:without scoliosis kyphosis* ** Lungs: Clear bilaterally to auscultation without wheezes, rales, or rhonchi. Breathing is  unlabored. Heart: RRR with S1 S2.  2/6 systolic  murmur . No rubs, or gallops appreciated. Abdomen: Soft, non-tender, non-distended with normoactive bowel sounds. No hepatomegaly. No rebound/guarding. No obvious abdominal masses. Msk:  Strength and tone appear normal for age. Extremities: No clubbing or cyanosis. No* edema.  Distal pedal pulses are 2+ and equal bilaterally. Skin: Warm and Dry Neuro: Alert and oriented X 3. CN III-XII intact Grossly normal sensory and motor function . Psych:  Responds to questions appropriately with a normal affect.      Labs: Cardiac Enzymes No results for input(s): CKTOTAL, CKMB, TROPONINI in the last 72 hours. CBC Lab Results  Component Value Date   WBC 10.0 06/21/2016  HGB 11.1 (L) 06/21/2016   HCT 34.7 (L) 06/21/2016   MCV 89.7 06/21/2016   PLT 345 06/21/2016   PROTIME: No results for input(s): LABPROT, INR in the last 72 hours. Chemistry No results for input(s): NA, K, CL, CO2, BUN, CREATININE, CALCIUM, PROT, BILITOT, ALKPHOS, ALT, AST, GLUCOSE in the last 168 hours.  Invalid input(s): LABALBU Lipids Lab Results  Component Value Date   CHOL 214 (H) 02/28/2016   HDL 106.10 02/28/2016   LDLCALC 96 02/28/2016   TRIG 62.0 02/28/2016   BNP No results found for: PROBNP Thyroid Function Tests: No results for input(s): TSH, T4TOTAL, T3FREE, THYROIDAB in the last 72 hours.  Invalid input(s): FREET3 Miscellaneous Lab Results  Component Value Date   DDIMER 0.88 (H) 07/21/2012    Radiology/Studies:  No results found.  EKG: From 11/27/7016 demonstrates sinus rhythm with right bundle branch block Intervals 15/15/43    Assessment and Plan:   Palpitations  Recurrent stroke    Although the history of stroke is remote, the possibility of atrial fibrillation remains. Furthermore, there has been a great deal of anxiety related to her different palpitation mechanisms. She has been unable to tolerate a cutaneous monitor; hence, it is  reasonable I think to pursue implantable loop recorder.  Have reviewed the risks and benefits mostly related to infection     Virl Axe

## 2016-12-09 NOTE — Patient Instructions (Signed)
Medication Instructions: - Your physician recommends that you continue on your current medications as directed. Please refer to the Current Medication list given to you today.  Labwork: - none ordered  Procedures/Testing: - Your physician has recommended that you have a loop recorder (LINQ) monitor implanted   Monday 12/15/16 with Dr. Caryl Comes. - arrive at the [facility], Entrance "A" at [facility] at 10:30 am the morning of your procedure (this is located on the [facility] side of the hospital) - once you enter, proceed straight down the hall way to Admitting to register, this will be on your left - you may have a light breakfast the morning of your procedure - you may take all of your regular medications the morning of your procedure  Follow-Up: - Your physician recommends that you schedule a follow-up appointment in: 10-14 days (from 12/15/16) for a wound check with the [facility] nurse   Any Additional Special Instructions Will Be Listed Below (If Applicable).     If you need a refill on your cardiac medications before your next appointment, please call your pharmacy.

## 2016-12-11 ENCOUNTER — Telehealth: Payer: Self-pay | Admitting: Internal Medicine

## 2016-12-11 NOTE — Telephone Encounter (Signed)
I called and spoke with the patient. She states she wanted to cancel her loop recorder implant for Monday. She feels she does not want to proceed with this at this time due to renal issues and knee issues.  I advised her I will cancel this at the hospital- will notify Dr. Caryl Comes ([place] only).   Spoke with Santiago Glad in the EP lab- procedure cancelled.

## 2016-12-11 NOTE — Telephone Encounter (Signed)
New Message     Pt wants wants to cancel procedure for Monday, she does not want to have the implant

## 2016-12-15 ENCOUNTER — Encounter (HOSPITAL_COMMUNITY): Payer: Self-pay

## 2016-12-15 ENCOUNTER — Ambulatory Visit (HOSPITAL_COMMUNITY): Admit: 2016-12-15 | Payer: Medicare Other | Admitting: Internal Medicine

## 2016-12-15 SURGERY — LOOP RECORDER INSERTION

## 2016-12-16 DIAGNOSIS — L578 Other skin changes due to chronic exposure to nonionizing radiation: Secondary | ICD-10-CM | POA: Diagnosis not present

## 2016-12-16 DIAGNOSIS — D2239 Melanocytic nevi of other parts of face: Secondary | ICD-10-CM | POA: Diagnosis not present

## 2016-12-16 DIAGNOSIS — L821 Other seborrheic keratosis: Secondary | ICD-10-CM | POA: Diagnosis not present

## 2016-12-18 ENCOUNTER — Other Ambulatory Visit: Payer: Self-pay

## 2016-12-18 MED ORDER — CALCITRIOL 0.25 MCG PO CAPS: 0.2500 ug | ORAL_CAPSULE | Freq: Every day | ORAL | 2 refills | Status: DC

## 2017-01-02 ENCOUNTER — Other Ambulatory Visit: Payer: Self-pay

## 2017-01-02 ENCOUNTER — Telehealth: Payer: Self-pay | Admitting: Gastroenterology

## 2017-01-02 MED ORDER — ONDANSETRON 4 MG PO TBDP: 4.0000 mg | ORAL_TABLET | Freq: Three times a day (TID) | ORAL | 0 refills | Status: DC | PRN

## 2017-01-02 MED ORDER — METRONIDAZOLE 500 MG PO TABS: 500.0000 mg | ORAL_TABLET | Freq: Three times a day (TID) | ORAL | 0 refills | Status: AC

## 2017-01-02 MED ORDER — CIPROFLOXACIN HCL 500 MG PO TABS: 500.0000 mg | ORAL_TABLET | Freq: Two times a day (BID) | ORAL | 0 refills | Status: DC

## 2017-01-02 NOTE — Telephone Encounter (Signed)
Spoke to patient, let her know about the zofran to help with nausea. She is also aware that if needed there is a physician on call, and if she can only tolerate flagyl BID.

## 2017-01-02 NOTE — Telephone Encounter (Signed)
Routed to Dr. Armbruster. 

## 2017-01-02 NOTE — Telephone Encounter (Signed)
Sounds like she is having recurrent diverticulitis if she is having pain like she has had previously. Recommend empiric therapy heading into the long weekend - can give Cipro 500mg  BID and flagyl 500mg  TID for one week duration. The last time she had some nausea with the flagyl and was able to only take it BID. If that is the case again that is okay. We can give her some zofran 4mg  q 8 hours PRN dissolvable tablet if that helps with her nausea from flagyl. If no improvement she can call us back. Thanks

## 2017-01-13 DIAGNOSIS — G8929 Other chronic pain: Secondary | ICD-10-CM | POA: Diagnosis not present

## 2017-01-13 DIAGNOSIS — M1712 Unilateral primary osteoarthritis, left knee: Secondary | ICD-10-CM | POA: Diagnosis not present

## 2017-01-13 DIAGNOSIS — M25562 Pain in left knee: Secondary | ICD-10-CM | POA: Diagnosis not present

## 2017-01-15 ENCOUNTER — Other Ambulatory Visit: Payer: Self-pay | Admitting: Neurology

## 2017-01-28 DIAGNOSIS — Z1231 Encounter for screening mammogram for malignant neoplasm of breast: Secondary | ICD-10-CM | POA: Diagnosis not present

## 2017-01-29 DIAGNOSIS — D631 Anemia in chronic kidney disease: Secondary | ICD-10-CM | POA: Diagnosis not present

## 2017-01-29 DIAGNOSIS — N390 Urinary tract infection, site not specified: Secondary | ICD-10-CM | POA: Diagnosis not present

## 2017-01-29 DIAGNOSIS — I10 Essential (primary) hypertension: Secondary | ICD-10-CM | POA: Diagnosis not present

## 2017-01-29 DIAGNOSIS — D509 Iron deficiency anemia, unspecified: Secondary | ICD-10-CM | POA: Diagnosis not present

## 2017-01-29 DIAGNOSIS — N2581 Secondary hyperparathyroidism of renal origin: Secondary | ICD-10-CM | POA: Diagnosis not present

## 2017-01-29 DIAGNOSIS — N184 Chronic kidney disease, stage 4 (severe): Secondary | ICD-10-CM | POA: Diagnosis not present

## 2017-02-16 ENCOUNTER — Encounter: Payer: Self-pay | Admitting: Endocrinology

## 2017-02-16 ENCOUNTER — Ambulatory Visit: Payer: Medicare Other | Admitting: Endocrinology

## 2017-02-16 DIAGNOSIS — Z23 Encounter for immunization: Secondary | ICD-10-CM

## 2017-02-22 ENCOUNTER — Emergency Department (HOSPITAL_COMMUNITY): Payer: Medicare Other

## 2017-02-22 ENCOUNTER — Encounter (HOSPITAL_COMMUNITY): Payer: Self-pay

## 2017-02-22 ENCOUNTER — Inpatient Hospital Stay (HOSPITAL_COMMUNITY)
Admission: EM | Admit: 2017-02-22 | Discharge: 2017-02-24 | DRG: 243 | Disposition: A | Discharge status: Home / Self Care | Payer: Medicare Other | Attending: Internal Medicine | Admitting: Internal Medicine

## 2017-02-22 DIAGNOSIS — K5730 Diverticulosis of large intestine without perforation or abscess without bleeding: Secondary | ICD-10-CM | POA: Diagnosis present

## 2017-02-22 DIAGNOSIS — R001 Bradycardia, unspecified: Secondary | ICD-10-CM | POA: Diagnosis not present

## 2017-02-22 DIAGNOSIS — S0993XA Unspecified injury of face, initial encounter: Secondary | ICD-10-CM | POA: Diagnosis not present

## 2017-02-22 DIAGNOSIS — I459 Conduction disorder, unspecified: Secondary | ICD-10-CM

## 2017-02-22 DIAGNOSIS — Z91040 Latex allergy status: Secondary | ICD-10-CM

## 2017-02-22 DIAGNOSIS — K219 Gastro-esophageal reflux disease without esophagitis: Secondary | ICD-10-CM | POA: Diagnosis present

## 2017-02-22 DIAGNOSIS — D631 Anemia in chronic kidney disease: Secondary | ICD-10-CM | POA: Diagnosis present

## 2017-02-22 DIAGNOSIS — I129 Hypertensive chronic kidney disease with stage 1 through stage 4 chronic kidney disease, or unspecified chronic kidney disease: Secondary | ICD-10-CM | POA: Diagnosis present

## 2017-02-22 DIAGNOSIS — S0083XA Contusion of other part of head, initial encounter: Secondary | ICD-10-CM

## 2017-02-22 DIAGNOSIS — I639 Cerebral infarction, unspecified: Secondary | ICD-10-CM | POA: Diagnosis present

## 2017-02-22 DIAGNOSIS — E7800 Pure hypercholesterolemia, unspecified: Secondary | ICD-10-CM | POA: Diagnosis present

## 2017-02-22 DIAGNOSIS — N179 Acute kidney failure, unspecified: Secondary | ICD-10-CM | POA: Diagnosis not present

## 2017-02-22 DIAGNOSIS — Z882 Allergy status to sulfonamides status: Secondary | ICD-10-CM

## 2017-02-22 DIAGNOSIS — N184 Chronic kidney disease, stage 4 (severe): Secondary | ICD-10-CM | POA: Diagnosis present

## 2017-02-22 DIAGNOSIS — E038 Other specified hypothyroidism: Secondary | ICD-10-CM | POA: Diagnosis present

## 2017-02-22 DIAGNOSIS — M1710 Unilateral primary osteoarthritis, unspecified knee: Secondary | ICD-10-CM | POA: Diagnosis present

## 2017-02-22 DIAGNOSIS — E1142 Type 2 diabetes mellitus with diabetic polyneuropathy: Secondary | ICD-10-CM | POA: Diagnosis present

## 2017-02-22 DIAGNOSIS — I452 Bifascicular block: Principal | ICD-10-CM | POA: Diagnosis present

## 2017-02-22 DIAGNOSIS — Z7982 Long term (current) use of aspirin: Secondary | ICD-10-CM

## 2017-02-22 DIAGNOSIS — R55 Syncope and collapse: Secondary | ICD-10-CM | POA: Diagnosis present

## 2017-02-22 DIAGNOSIS — N2581 Secondary hyperparathyroidism of renal origin: Secondary | ICD-10-CM | POA: Diagnosis not present

## 2017-02-22 DIAGNOSIS — E039 Hypothyroidism, unspecified: Secondary | ICD-10-CM | POA: Diagnosis not present

## 2017-02-22 DIAGNOSIS — D509 Iron deficiency anemia, unspecified: Secondary | ICD-10-CM | POA: Diagnosis present

## 2017-02-22 DIAGNOSIS — E785 Hyperlipidemia, unspecified: Secondary | ICD-10-CM | POA: Diagnosis present

## 2017-02-22 DIAGNOSIS — Z888 Allergy status to other drugs, medicaments and biological substances status: Secondary | ICD-10-CM

## 2017-02-22 DIAGNOSIS — Z7902 Long term (current) use of antithrombotics/antiplatelets: Secondary | ICD-10-CM

## 2017-02-22 DIAGNOSIS — S0990XA Unspecified injury of head, initial encounter: Secondary | ICD-10-CM | POA: Diagnosis not present

## 2017-02-22 DIAGNOSIS — S199XXA Unspecified injury of neck, initial encounter: Secondary | ICD-10-CM | POA: Diagnosis not present

## 2017-02-22 DIAGNOSIS — Z8673 Personal history of transient ischemic attack (TIA), and cerebral infarction without residual deficits: Secondary | ICD-10-CM

## 2017-02-22 DIAGNOSIS — I779 Disorder of arteries and arterioles, unspecified: Secondary | ICD-10-CM | POA: Diagnosis present

## 2017-02-22 DIAGNOSIS — M479 Spondylosis, unspecified: Secondary | ICD-10-CM | POA: Diagnosis present

## 2017-02-22 DIAGNOSIS — D649 Anemia, unspecified: Secondary | ICD-10-CM | POA: Insufficient documentation

## 2017-02-22 DIAGNOSIS — Z883 Allergy status to other anti-infective agents status: Secondary | ICD-10-CM

## 2017-02-22 DIAGNOSIS — I739 Peripheral vascular disease, unspecified: Secondary | ICD-10-CM

## 2017-02-22 DIAGNOSIS — Y9389 Activity, other specified: Secondary | ICD-10-CM

## 2017-02-22 DIAGNOSIS — W1830XA Fall on same level, unspecified, initial encounter: Secondary | ICD-10-CM | POA: Diagnosis present

## 2017-02-22 DIAGNOSIS — M1712 Unilateral primary osteoarthritis, left knee: Secondary | ICD-10-CM | POA: Diagnosis present

## 2017-02-22 DIAGNOSIS — M79642 Pain in left hand: Secondary | ICD-10-CM | POA: Diagnosis not present

## 2017-02-22 DIAGNOSIS — S299XXA Unspecified injury of thorax, initial encounter: Secondary | ICD-10-CM | POA: Diagnosis not present

## 2017-02-22 DIAGNOSIS — I2510 Atherosclerotic heart disease of native coronary artery without angina pectoris: Secondary | ICD-10-CM | POA: Diagnosis present

## 2017-02-22 DIAGNOSIS — Z8249 Family history of ischemic heart disease and other diseases of the circulatory system: Secondary | ICD-10-CM

## 2017-02-22 DIAGNOSIS — Z950 Presence of cardiac pacemaker: Secondary | ICD-10-CM | POA: Diagnosis present

## 2017-02-22 DIAGNOSIS — Z79899 Other long term (current) drug therapy: Secondary | ICD-10-CM

## 2017-02-22 DIAGNOSIS — K08119 Complete loss of teeth due to trauma, unspecified class: Secondary | ICD-10-CM | POA: Diagnosis present

## 2017-02-22 DIAGNOSIS — S0093XA Contusion of unspecified part of head, initial encounter: Secondary | ICD-10-CM | POA: Diagnosis not present

## 2017-02-22 DIAGNOSIS — S098XXA Other specified injuries of head, initial encounter: Secondary | ICD-10-CM | POA: Diagnosis present

## 2017-02-22 DIAGNOSIS — Z91041 Radiographic dye allergy status: Secondary | ICD-10-CM

## 2017-02-22 DIAGNOSIS — Z885 Allergy status to narcotic agent status: Secondary | ICD-10-CM

## 2017-02-22 DIAGNOSIS — Y9222 Religious institution as the place of occurrence of the external cause: Secondary | ICD-10-CM

## 2017-02-22 DIAGNOSIS — E559 Vitamin D deficiency, unspecified: Secondary | ICD-10-CM | POA: Diagnosis present

## 2017-02-22 DIAGNOSIS — R002 Palpitations: Secondary | ICD-10-CM | POA: Diagnosis present

## 2017-02-22 DIAGNOSIS — M179 Osteoarthritis of knee, unspecified: Secondary | ICD-10-CM | POA: Diagnosis present

## 2017-02-22 LAB — CBC WITH DIFFERENTIAL/PLATELET
BASOS ABS: 0.1 10*3/uL (ref 0.0–0.1)
BASOS PCT: 1 %
Eosinophils Absolute: 0.2 10*3/uL (ref 0.0–0.7)
Eosinophils Relative: 2 %
HEMATOCRIT: 33.7 % — AB (ref 36.0–46.0)
HEMOGLOBIN: 11.1 g/dL — AB (ref 12.0–15.0)
Lymphocytes Relative: 20 %
Lymphs Abs: 1.6 10*3/uL (ref 0.7–4.0)
MCH: 29.7 pg (ref 26.0–34.0)
MCHC: 32.9 g/dL (ref 30.0–36.0)
MCV: 90.1 fL (ref 78.0–100.0)
MONO ABS: 0.4 10*3/uL (ref 0.1–1.0)
Monocytes Relative: 5 %
NEUTROS ABS: 6 10*3/uL (ref 1.7–7.7)
NEUTROS PCT: 72 %
Platelets: 292 10*3/uL (ref 150–400)
RBC: 3.74 MIL/uL — ABNORMAL LOW (ref 3.87–5.11)
RDW: 14.3 % (ref 11.5–15.5)
WBC: 8.3 10*3/uL (ref 4.0–10.5)

## 2017-02-22 LAB — TROPONIN I
Troponin I: <0.03 ng/mL (ref <0.03)
Troponin I: <0.03 ng/mL (ref <0.03)

## 2017-02-22 LAB — URINALYSIS, ROUTINE W REFLEX MICROSCOPIC
BILIRUBIN URINE: NEGATIVE
Glucose, UA: 50 mg/dL — AB
Hgb urine dipstick: NEGATIVE
KETONES UR: NEGATIVE mg/dL
Leukocytes, UA: NEGATIVE
NITRITE: NEGATIVE
Protein, ur: NEGATIVE mg/dL
SPECIFIC GRAVITY, URINE: 1.01 (ref 1.005–1.030)
pH: 6 (ref 5.0–8.0)

## 2017-02-22 LAB — BASIC METABOLIC PANEL
ANION GAP: 9 (ref 5–15)
BUN: 45 mg/dL — ABNORMAL HIGH (ref 6–20)
CALCIUM: 9.4 mg/dL (ref 8.9–10.3)
CO2: 20 mmol/L — AB (ref 22–32)
Chloride: 109 mmol/L (ref 101–111)
Creatinine, Ser: 3.88 mg/dL — ABNORMAL HIGH (ref 0.44–1.00)
GFR calc Af Amer: 12 mL/min — ABNORMAL LOW (ref >60)
GFR calc non Af Amer: 10 mL/min — ABNORMAL LOW (ref >60)
Glucose, Bld: 99 mg/dL (ref 65–99)
Potassium: 4.4 mmol/L (ref 3.5–5.1)
Sodium: 138 mmol/L (ref 135–145)

## 2017-02-22 LAB — TSH: TSH: 0.203 u[IU]/mL — ABNORMAL LOW (ref 0.350–4.500)

## 2017-02-22 LAB — I-STAT TROPONIN, ED: TROPONIN I, POC: 0 ng/mL (ref 0.00–0.08)

## 2017-02-22 LAB — CBG MONITORING, ED: GLUCOSE-CAPILLARY: 87 mg/dL (ref 65–99)

## 2017-02-22 MED ORDER — HEPARIN SODIUM (PORCINE) 5000 UNIT/ML IJ SOLN: 5000.0000 [IU] | Freq: Three times a day (TID) | INTRAMUSCULAR | Status: DC

## 2017-02-22 MED ORDER — CHLORHEXIDINE GLUCONATE 4 % EX LIQD
CUTANEOUS | Status: AC
  Administered 2017-02-23: 2
  Filled 2017-02-22: qty 60

## 2017-02-22 MED ORDER — CHLORHEXIDINE GLUCONATE 4 % EX LIQD
60.0000 mL | Freq: Once | CUTANEOUS | Status: AC
  Administered 2017-02-22: 4 via TOPICAL

## 2017-02-22 MED ORDER — AMLODIPINE BESYLATE 5 MG PO TABS
5.0000 mg | ORAL_TABLET | Freq: Two times a day (BID) | ORAL | Status: DC
  Administered 2017-02-23 (×2): 5 mg via ORAL
  Filled 2017-02-22 (×2): qty 1

## 2017-02-22 MED ORDER — ACETAMINOPHEN 325 MG PO TABS: 650.0000 mg | ORAL_TABLET | Freq: Four times a day (QID) | ORAL | Status: DC | PRN

## 2017-02-22 MED ORDER — SODIUM CHLORIDE 0.9 % IV SOLN
INTRAVENOUS | Status: DC
  Administered 2017-02-22 – 2017-02-24 (×3): via INTRAVENOUS

## 2017-02-22 MED ORDER — SENNOSIDES-DOCUSATE SODIUM 8.6-50 MG PO TABS: 1.0000 | ORAL_TABLET | Freq: Every evening | ORAL | Status: DC | PRN

## 2017-02-22 MED ORDER — CALCITRIOL 0.25 MCG PO CAPS
0.2500 ug | ORAL_CAPSULE | Freq: Every day | ORAL | Status: DC
  Filled 2017-02-22: qty 1

## 2017-02-22 MED ORDER — SODIUM CHLORIDE 0.9% FLUSH
3.0000 mL | Freq: Two times a day (BID) | INTRAVENOUS | Status: DC
  Administered 2017-02-23 (×2): 3 mL via INTRAVENOUS

## 2017-02-22 MED ORDER — SODIUM CHLORIDE 0.9 % IV SOLN
INTRAVENOUS | Status: DC
  Administered 2017-02-22: 13:00:00 via INTRAVENOUS

## 2017-02-22 MED ORDER — ONDANSETRON HCL 4 MG PO TABS: 4.0000 mg | ORAL_TABLET | Freq: Four times a day (QID) | ORAL | Status: DC | PRN

## 2017-02-22 MED ORDER — SODIUM CHLORIDE 0.9 % IV SOLN: INTRAVENOUS | Status: DC

## 2017-02-22 MED ORDER — MORPHINE SULFATE (PF) 4 MG/ML IV SOLN: 1.0000 mg | INTRAVENOUS | Status: DC | PRN

## 2017-02-22 MED ORDER — CLOPIDOGREL BISULFATE 75 MG PO TABS
75.0000 mg | ORAL_TABLET | Freq: Every day | ORAL | Status: DC
  Filled 2017-02-22: qty 1

## 2017-02-22 MED ORDER — ASPIRIN EC 81 MG PO TBEC
81.0000 mg | DELAYED_RELEASE_TABLET | Freq: Every day | ORAL | Status: DC
  Filled 2017-02-22: qty 1

## 2017-02-22 MED ORDER — SODIUM CHLORIDE 0.9 % IV SOLN
INTRAVENOUS | Status: DC
  Administered 2017-02-23: 50 mL/h via INTRAVENOUS

## 2017-02-22 MED ORDER — CHLORHEXIDINE GLUCONATE 4 % EX LIQD
60.0000 mL | Freq: Once | CUTANEOUS | Status: AC
  Administered 2017-02-23: 4 via TOPICAL

## 2017-02-22 MED ORDER — CEFAZOLIN SODIUM-DEXTROSE 2-4 GM/100ML-% IV SOLN
2.0000 g | INTRAVENOUS | Status: AC
  Administered 2017-02-23: 2 g via INTRAVENOUS

## 2017-02-22 MED ORDER — ONDANSETRON HCL 4 MG/2ML IJ SOLN: 4.0000 mg | Freq: Four times a day (QID) | INTRAMUSCULAR | Status: DC | PRN

## 2017-02-22 MED ORDER — METOPROLOL TARTRATE 25 MG PO TABS: 25.0000 mg | ORAL_TABLET | Freq: Two times a day (BID) | ORAL | Status: DC

## 2017-02-22 MED ORDER — SODIUM CHLORIDE 0.9 % IR SOLN
80.0000 mg | Status: AC
  Administered 2017-02-23: 80 mg

## 2017-02-22 MED ORDER — ACETAMINOPHEN 650 MG RE SUPP: 650.0000 mg | Freq: Four times a day (QID) | RECTAL | Status: DC | PRN

## 2017-02-22 MED ORDER — BISACODYL 5 MG PO TBEC: 5.0000 mg | DELAYED_RELEASE_TABLET | Freq: Every day | ORAL | Status: DC | PRN

## 2017-02-22 MED ORDER — ROSUVASTATIN CALCIUM 10 MG PO TABS
5.0000 mg | ORAL_TABLET | Freq: Every day | ORAL | Status: DC
  Administered 2017-02-23: 5 mg via ORAL
  Filled 2017-02-22: qty 1

## 2017-02-22 MED ORDER — LEVOTHYROXINE SODIUM 88 MCG PO TABS
88.0000 ug | ORAL_TABLET | Freq: Every day | ORAL | Status: DC
  Administered 2017-02-23 – 2017-02-24 (×2): 88 ug via ORAL
  Filled 2017-02-22 (×2): qty 1

## 2017-02-22 MED ORDER — GABAPENTIN 100 MG PO CAPS
100.0000 mg | ORAL_CAPSULE | Freq: Every day | ORAL | Status: DC
  Filled 2017-02-22: qty 1

## 2017-02-22 NOTE — ED Provider Notes (Signed)
[facility] EMERGENCY DEPARTMENT Provider Note   CSN: [id number] Arrival date & time: 02/22/17  1212     History   Chief Complaint Syncope/facial trauma  HPI Katrina Yang is a 76 y.o. female.  HPI Patient presents to the emergency room for evaluation after a syncopal episode.  Patient was at church singing. She felt fine. She started to feel lightheaded while singing and then suddenly had a syncopal episode. Patient's husband states she suddenly fell forward and did not brace herself at all. Patient struck her head and knocked out a crown. She has bruising and swelling in her face. She also has some swelling and bruising in her left hand. She was able to get up and stand. She denies any hip pain.  She does have a headache on the left side of her head. She denies any numbness or weakness. No chest pain or shortness of breath. She has has history of stroke and TIA but denies any previous history of this.  No seizure activity. Past Medical History:  Diagnosis Date  . Carotid bruit    a.  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  . Cervical spondylosis without myelopathy 02/01/2015  . CKD (chronic kidney disease), stage IV ([place])   . Colonic polyp    hx of  . CVA (cerebral vascular accident) ([place])    hx of  . Degenerative joint disease   . Diverticulosis of colon (without mention of hemorrhage)   . GERD (gastroesophageal reflux disease)   . History of hysterectomy   . Hypertension    a. Renal artery dopplers in 2012 with no evidence for significant renal artery stenosis.   . Occipital neuralgia of left side 08/30/2013  . Osteoporosis, unspecified   . Palpitations    a. Event monitor (8/14) with no significant abnormalities.   . Peripheral neuropathic pain   . Personal history of unspecified urinary disorder   . Pulmonary nodule   . Pure hypercholesterolemia   . Secondary hyperparathyroidism (of renal origin)   . Unspecified hypothyroidism     Patient Active  Problem List   Diagnosis Date Noted  . Paronychia of finger 11/03/2016  . Carotid artery disease ([place]) 11/05/2015  . Skin lesion 11/01/2015  . Medication reaction 11/01/2015  . CKD (chronic kidney disease) stage 4, GFR 15-29 ml/min (HCC) 08/28/2015  . Anemia, iron deficiency 08/28/2015  . Temporary cerebral vascular dysfunction 03/14/2015  . Atrophic kidney 03/14/2015  . Frequent UTI 03/14/2015  . Dyslipidemia 03/14/2015  . Arthritis of knee, degenerative 03/14/2015  . Peripheral nerve disease 03/14/2015  . Cerebrovascular accident (CVA) ([place]) 03/14/2015  . Cervical spondylosis without myelopathy 02/01/2015  . Knee pain 06/28/2014  . Pain in joint, lower leg 06/28/2014  . Nerve root disorder 05/12/2014  . Post laminectomy syndrome 05/12/2014  . Occipital neuralgia of left side 08/30/2013  . Kidney cysts 07/20/2013  . Unspecified constipation 05/19/2013  . Avitaminosis D 02/01/2013  . Hyperparathyroidism due to renal insufficiency ([place]) 02/01/2013  . Low back pain 01/19/2013  . Numbness 07/21/2012  . Pneumonia, organism unspecified(486) 04/04/2011  . Encounter for long-term (current) use of other medications 01/03/2011  . Sinusitis, chronic 10/01/2010  . DYSPHAGIA UNSPECIFIED 05/16/2010  . POLYURIA 05/16/2010  . CELLULITIS AND ABSCESS OF FACE 02/19/2009  . 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 Pure hypercholesterolemia 08/17/2008  . Cerebral artery occlusion with cerebral infarction ([place]) 08/17/2008  . History of cardiovascular disorder 08/17/2008  . PULMONARY NODULE 04/27/2007  . Hypothyroidism 02/06/2007  . Essential  hypertension 02/06/2007  . GERD 02/06/2007  . DIVERTICULOSIS, COLON 02/06/2007  . DEGENERATIVE JOINT DISEASE, SPINE 02/06/2007  . Osteoporosis 02/06/2007  . PALPITATIONS 02/06/2007  . COLONIC POLYPS, HX OF 02/06/2007    Past Surgical History:  Procedure Laterality Date  . ABDOMINAL HYSTERECTOMY    . BACK SURGERY     diskectomy L-5. Multiple back  surgeries  . MENISCECTOMY Left 11/14/15    OB History    No data available       Home Medications    Prior to Admission medications   Medication Sig Start Date End Date Taking? Authorizing Provider  amLODipine (NORVASC) 10 MG tablet Take 5 mg by mouth 2 (two) times daily.  06/19/15  Yes [provider]  aspirin EC 81 MG tablet Take 81 mg by mouth daily.   Yes [provider]  calcitRIOL (ROCALTROL) 0.25 MCG capsule Take 1 capsule (0.25 mcg total) by mouth daily. 12/18/16  Yes Renato Shin, MD  clopidogrel (PLAVIX) 75 MG tablet TAKE 1 TABLET (75 MG TOTAL) BY MOUTH DAILY. 01/15/17  Yes Kathrynn Ducking, MD  gabapentin (NEURONTIN) 100 MG capsule TAKE 1 CAPSULE (100 MG TOTAL) BY MOUTH DAILY. 10/20/16  Yes Kathrynn Ducking, MD  levothyroxine (SYNTHROID, LEVOTHROID) 88 MCG tablet TAKE 1 TABLET (88 MCG TOTAL) BY MOUTH DAILY BEFORE BREAKFAST. 09/03/16  Yes Renato Shin, MD  metoprolol tartrate (LOPRESSOR) 25 MG tablet TAKE 1 TABLET (25 MG TOTAL) BY MOUTH 2 (TWO) TIMES DAILY. 09/22/16  Yes Renato Shin, MD  ondansetron (ZOFRAN-ODT) 4 MG disintegrating tablet Take 1 tablet (4 mg total) by mouth every 8 (eight) hours as needed for nausea or vomiting. 01/02/17  Yes Armbruster, Carlota Raspberry, MD  rosuvastatin (CRESTOR) 10 MG tablet Take 5 mg by mouth daily.   Yes [provider]    Family History Family History  Problem Relation Age of Onset  . Kidney cancer Father   . Heart attack Father   . Heart disease Father   . Colon cancer Mother   . Ovarian cancer Mother   . Heart disease Mother   . Heart failure Mother   . Stomach cancer Neg Hx     Social History Social History  Substance Use Topics  . Smoking status: Never Smoker  . Smokeless tobacco: Never Used  . Alcohol use No     Allergies   Iodinated diagnostic agents; Metronidazole; Latex; Sulfonamide derivatives; Codeine; Lovastatin; Sulfa antibiotics; and Sulfamethoxazole-trimethoprim   Review of  Systems Review of Systems  All other systems reviewed and are negative.    Physical Exam Updated Vital Signs BP (!) 149/78   Pulse 68   Temp (!) 97.5 F (36.4 C) (Oral)   Resp 16   SpO2 100%   Physical Exam  Constitutional: No distress.  HENT:  Head: Normocephalic. Head is with contusion. Head is without Battle's sign.  Right Ear: External ear normal.  Left Ear: External ear normal.  Mouth/Throat: No lacerations.  Missing crown upper teeth, contusion to lip, contusion to tongue  Eyes: Conjunctivae are normal. Right eye exhibits no discharge. Left eye exhibits no discharge. No scleral icterus.  Neck: Neck supple. No tracheal deviation present.  Cardiovascular: Normal rate, regular rhythm and intact distal pulses.   Pulmonary/Chest: Effort normal and breath sounds normal. No stridor. No respiratory distress. She has no wheezes. She has no rales.  Abdominal: Soft. Bowel sounds are normal. She exhibits no distension. There is no tenderness. There is no rebound and no guarding.  Musculoskeletal: She  exhibits no edema or tenderness.  Neurological: She is alert. She has normal strength. No cranial nerve deficit (no facial droop, extraocular movements intact, no slurred speech) or sensory deficit. She exhibits normal muscle tone. She displays no seizure activity. Coordination normal.  Skin: Skin is warm and dry. No rash noted.  Psychiatric: She has a normal mood and affect.  Nursing note and vitals reviewed.    ED Treatments / Results  Labs (all labs ordered are listed, but only abnormal results are displayed) Labs Reviewed  CBC WITH DIFFERENTIAL/PLATELET - Abnormal; Notable for the following:       Result Value   RBC 3.74 (*)    Hemoglobin 11.1 (*)    HCT 33.7 (*)    All other components within normal limits  BASIC METABOLIC PANEL - Abnormal; Notable for the following:    CO2 20 (*)    BUN 45 (*)    Creatinine, Ser 3.88 (*)    GFR calc non Af Amer 10 (*)    GFR calc Af  Amer 12 (*)    All other components within normal limits  CBG MONITORING, ED  I-STAT TROPONIN, ED    EKG  EKG Interpretation  Date/Time:  Sunday February 22 2017 12:35:50 EDT Ventricular Rate:  72 PR Interval:    QRS Duration: 162 QT Interval:  441 QTC Calculation: 483 R Axis:   97 Text Interpretation:  Sinus rhythm RBBB and LPFB RBBB is new since last tracing Confirmed by Dorie Rank [phone]) on 02/22/2017 12:54:21 PM Also confirmed by Dorie Rank [id number]), editor Drema Pry [phone])  on 02/22/2017 1:20:25 PM       Radiology Ct Head Wo Contrast  Result Date: 02/22/2017 CLINICAL DATA:  76 year old female with syncope and fall this morning with head, face and neck injury. Left facial pain and headache. EXAM: CT HEAD WITHOUT CONTRAST CT MAXILLOFACIAL WITHOUT CONTRAST CT CERVICAL SPINE WITHOUT CONTRAST TECHNIQUE: Multidetector CT imaging of the head, cervical spine, and maxillofacial structures were performed using the standard protocol without intravenous contrast. Multiplanar CT image reconstructions of the cervical spine and maxillofacial structures were also generated. COMPARISON:  03/03/2013 brain and cervical spine MR. 12/08/2004 head CT. FINDINGS: CT HEAD FINDINGS Brain: No evidence of acute infarction, hemorrhage, hydrocephalus, extra-axial collection or mass lesion/mass effect. Mild atrophy and mild chronic small-vessel white matter ischemic changes noted. Vascular: Intracranial atherosclerotic calcifications noted. Skull: Normal. Negative for fracture or focal lesion. Other: Left forehead soft tissue swelling noted. CT MAXILLOFACIAL FINDINGS Osseous: No fracture or mandibular dislocation. No destructive process. Orbits: Negative. No traumatic or inflammatory finding. Sinuses: Clear. Soft tissues: Left forehead and facial soft tissue swelling noted. CT CERVICAL SPINE FINDINGS Alignment: Normal. Skull base and vertebrae: No acute fracture. No primary bone lesion or focal pathologic  process. Soft tissues and spinal canal: No prevertebral fluid or swelling. No visible canal hematoma. Disc levels: Multilevel degenerative disc disease/ spondylosis noted, mild to moderate from C4-C7. Upper chest: No acute abnormality Other: None IMPRESSION: 1. No evidence of acute intracranial abnormality. Atrophy and chronic small-vessel white matter ischemic changes. 2. Left facial and forehead soft tissue swelling without fracture. 3. No static evidence of acute injury to the cervical spine. Mild to moderate degenerative disc disease/spondylosis from C4-C7. Electronically Signed   By: Margarette Canada M.D.   On: 02/22/2017 14:53   Ct Cervical Spine Wo Contrast  Result Date: 02/22/2017 CLINICAL DATA:  76 year old female with syncope and fall this morning with head, face and neck injury. Left facial pain  and headache. EXAM: CT HEAD WITHOUT CONTRAST CT MAXILLOFACIAL WITHOUT CONTRAST CT CERVICAL SPINE WITHOUT CONTRAST TECHNIQUE: Multidetector CT imaging of the head, cervical spine, and maxillofacial structures were performed using the standard protocol without intravenous contrast. Multiplanar CT image reconstructions of the cervical spine and maxillofacial structures were also generated. COMPARISON:  03/03/2013 brain and cervical spine MR. 12/08/2004 head CT. FINDINGS: CT HEAD FINDINGS Brain: No evidence of acute infarction, hemorrhage, hydrocephalus, extra-axial collection or mass lesion/mass effect. Mild atrophy and mild chronic small-vessel white matter ischemic changes noted. Vascular: Intracranial atherosclerotic calcifications noted. Skull: Normal. Negative for fracture or focal lesion. Other: Left forehead soft tissue swelling noted. CT MAXILLOFACIAL FINDINGS Osseous: No fracture or mandibular dislocation. No destructive process. Orbits: Negative. No traumatic or inflammatory finding. Sinuses: Clear. Soft tissues: Left forehead and facial soft tissue swelling noted. CT CERVICAL SPINE FINDINGS Alignment:  Normal. Skull base and vertebrae: No acute fracture. No primary bone lesion or focal pathologic process. Soft tissues and spinal canal: No prevertebral fluid or swelling. No visible canal hematoma. Disc levels: Multilevel degenerative disc disease/ spondylosis noted, mild to moderate from C4-C7. Upper chest: No acute abnormality Other: None IMPRESSION: 1. No evidence of acute intracranial abnormality. Atrophy and chronic small-vessel white matter ischemic changes. 2. Left facial and forehead soft tissue swelling without fracture. 3. No static evidence of acute injury to the cervical spine. Mild to moderate degenerative disc disease/spondylosis from C4-C7. Electronically Signed   By: Margarette Canada M.D.   On: 02/22/2017 14:53   Dg Chest Port 1 View  Result Date: 02/22/2017 CLINICAL DATA:  Syncope.  Fall EXAM: PORTABLE CHEST 1 VIEW COMPARISON:  06/25/2015 FINDINGS: Heart size upper normal. Normal pulmonary vascularity. Lungs are clear without infiltrate or effusion. IMPRESSION: No active disease. Electronically Signed   By: Franchot Gallo M.D.   On: 02/22/2017 13:26   Dg Hand Complete Left  Result Date: 02/22/2017 CLINICAL DATA:  Syncope.  Fall.  Hand pain EXAM: LEFT HAND - COMPLETE 3+ VIEW COMPARISON:  None. FINDINGS: Advanced degenerative change at the base of thumb with joint space narrowing and spurring. Extension deformity of the first MCP joint. Osteoarthritis in the second and third DIP joints. Early chondrocalcinosis in the triangular fibrocartilage. Negative for fracture IMPRESSION: Negative for fracture. Electronically Signed   By: Franchot Gallo M.D.   On: 02/22/2017 13:27   Ct Maxillofacial Wo Contrast  Result Date: 02/22/2017 CLINICAL DATA:  76 year old female with syncope and fall this morning with head, face and neck injury. Left facial pain and headache. EXAM: CT HEAD WITHOUT CONTRAST CT MAXILLOFACIAL WITHOUT CONTRAST CT CERVICAL SPINE WITHOUT CONTRAST TECHNIQUE: Multidetector CT imaging of  the head, cervical spine, and maxillofacial structures were performed using the standard protocol without intravenous contrast. Multiplanar CT image reconstructions of the cervical spine and maxillofacial structures were also generated. COMPARISON:  03/03/2013 brain and cervical spine MR. 12/08/2004 head CT. FINDINGS: CT HEAD FINDINGS Brain: No evidence of acute infarction, hemorrhage, hydrocephalus, extra-axial collection or mass lesion/mass effect. Mild atrophy and mild chronic small-vessel white matter ischemic changes noted. Vascular: Intracranial atherosclerotic calcifications noted. Skull: Normal. Negative for fracture or focal lesion. Other: Left forehead soft tissue swelling noted. CT MAXILLOFACIAL FINDINGS Osseous: No fracture or mandibular dislocation. No destructive process. Orbits: Negative. No traumatic or inflammatory finding. Sinuses: Clear. Soft tissues: Left forehead and facial soft tissue swelling noted. CT CERVICAL SPINE FINDINGS Alignment: Normal. Skull base and vertebrae: No acute fracture. No primary bone lesion or focal pathologic process. Soft tissues and spinal canal:  No prevertebral fluid or swelling. No visible canal hematoma. Disc levels: Multilevel degenerative disc disease/ spondylosis noted, mild to moderate from C4-C7. Upper chest: No acute abnormality Other: None IMPRESSION: 1. No evidence of acute intracranial abnormality. Atrophy and chronic small-vessel white matter ischemic changes. 2. Left facial and forehead soft tissue swelling without fracture. 3. No static evidence of acute injury to the cervical spine. Mild to moderate degenerative disc disease/spondylosis from C4-C7. Electronically Signed   By: Margarette Canada M.D.   On: 02/22/2017 14:53    Procedures Procedures (including critical care time)  Medications Ordered in ED Medications  0.9 %  sodium chloride infusion ( Intravenous New Bag/Given 02/22/17 1252)     Initial Impression / Assessment and Plan / ED Course  I  have reviewed the triage vital signs and the nursing notes.  Pertinent labs & imaging results that were available during my care of the patient were reviewed by me and considered in my medical decision making (see chart for details).   patient presented to the emergency room for evaluation after syncopal episode.  The patient had very minimal prodrome. She felt a little bit lightheaded before the event occurred. Patient did not brace herself at all and she sustained significant facial contusions but unfortunately no evidence of any fracture. X-rays are reassuring.chronic renal sufficiency and stable anemia noted on her laboratory tests.I will consult with medical service for admission, cardiac monitoring and further evaluation of her syncope.  Final Clinical Impressions(s) / ED Diagnoses   Final diagnoses:  Syncope, unspecified syncope type  Contusion of face, initial encounter  Dental injury, initial encounter      Dorie Rank, MD 02/22/17 1541

## 2017-02-22 NOTE — H&P (Signed)
History and Physical    Katrina Yang SLH:[id number] DOB: 07-22-1940 DOA: 02/22/2017   PCP: Renato Shin, MD   Patient coming from:  Home    Chief Complaint:syncope  HPI: Katrina Yang is a 76 y.o. female with medical history significant for CK D stage IV, history of CVA, hypertension, history of palpitations, status post event monitor on 12/2012  with no significant abnormalities,hyperlipidemia, hypothyroidism, history of secondary hyperparathyroidism, presenting to the ED, after sustaining a syncopal episode while at church singing. Prior to these, the patient felt lightheaded, but denied any nausea vomiting or chest pain at the time. She denied any diaphoresis. The patient suddenly felt forward , did not brace herself, has streaking her head and knocked out some front teeth; the episode lasted a few seconds, and did not feel confused after this.   Did not see any seizure activity. The patient was not postictal afterwards. She has some bruising and swelling of her face, as well as left hand. She denies any hip pain. She has mild left-sided headaches after the bruising.she denies any numbness, or unilateral weakness.she denies any palpitations at this time. She denies any shortness of breath.she denies any recent infections. She denies any leg swelling. She admits to poor fluid intake over the last few weeks  ED Course:  BP (!) 149/78   Pulse 68   Temp (!) 97.5 F (36.4 C) (Oral)   Resp 16   SpO2 100%    CT of the head was negative for acute intracranial abnormalities, or hemorrhage. CT of the C-spine And maxillary area was negative for fracture Left hand x-ray was negative for fracture Chest x-ray was negative. Cardiology evaluation is pending. She received IV pain medications with some improvement in her symptoms. 4/14 MRI abnormal-chronic microvascular ischemia 4/17 carotid Dopplers less than 39% obstruction 8/18 Echo EF normal with normal left atrial size Hemoglobin 11.1 Urine  pending  Review of Systems:  As per HPI otherwise all other systems reviewed and are negative  Past Medical History:  Diagnosis Date  . Carotid bruit    a.  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  . Cervical spondylosis without myelopathy 02/01/2015  . CKD (chronic kidney disease), stage IV ([place])   . Colonic polyp    hx of  . CVA (cerebral vascular accident) (Katrina Yang)    hx of  . Degenerative joint disease   . Diverticulosis of colon (without mention of hemorrhage)   . GERD (gastroesophageal reflux disease)   . History of hysterectomy   . Hypertension    a. Renal artery dopplers in 2012 with no evidence for significant renal artery stenosis.   . Occipital neuralgia of left side 08/30/2013  . Osteoporosis, unspecified   . Palpitations    a. Event monitor (8/14) with no significant abnormalities.   . Peripheral neuropathic pain   . Personal history of unspecified urinary disorder   . Pulmonary nodule   . Pure hypercholesterolemia   . Secondary hyperparathyroidism (of renal origin)   . Unspecified hypothyroidism     Past Surgical History:  Procedure Laterality Date  . ABDOMINAL HYSTERECTOMY    . BACK SURGERY     diskectomy L-5. Multiple back surgeries  . MENISCECTOMY Left 11/14/15    Social History Social History   Social History  . Marital status: Married    Spouse name: [name]  . Number of children: 1  . Years of education: 2   Occupational History  . Retired [employer]  teacher   Social History Main Topics  . Smoking status: Never Smoker  . Smokeless tobacco: Never Used  . Alcohol use No  . Drug use: No  . Sexual activity: Not on file   Other Topics Concern  . Not on file   Social History Narrative   Pt lives at home with her spouse.   Patient drinks 1 glass of tea every other day.   Patient is right handed.     Allergies  Allergen Reactions  . Iodinated Diagnostic Agents Swelling and Anaphylaxis    Throat swelling  . Metronidazole  Nausea And Vomiting  . Latex Rash  . Sulfonamide Derivatives     Unknown allergic reaction  . Codeine Rash and Nausea And Vomiting  . Lovastatin Nausea Only    Unknown reaction  . Sulfa Antibiotics Rash  . Sulfamethoxazole-Trimethoprim Nausea Only    Family History  Problem Relation Age of Onset  . Kidney cancer Father   . Heart attack Father   . Heart disease Father   . Colon cancer Mother   . Ovarian cancer Mother   . Heart disease Mother   . Heart failure Mother   . Stomach cancer Neg Hx       Prior to Admission medications   Medication Sig Start Date End Date Taking? Authorizing Provider  amLODipine (NORVASC) 10 MG tablet Take 5 mg by mouth 2 (two) times daily.  06/19/15  Yes [provider]  aspirin EC 81 MG tablet Take 81 mg by mouth daily.   Yes [provider]  calcitRIOL (ROCALTROL) 0.25 MCG capsule Take 1 capsule (0.25 mcg total) by mouth daily. 12/18/16  Yes Renato Shin, MD  clopidogrel (PLAVIX) 75 MG tablet TAKE 1 TABLET (75 MG TOTAL) BY MOUTH DAILY. 01/15/17  Yes Kathrynn Ducking, MD  gabapentin (NEURONTIN) 100 MG capsule TAKE 1 CAPSULE (100 MG TOTAL) BY MOUTH DAILY. 10/20/16  Yes Kathrynn Ducking, MD  levothyroxine (SYNTHROID, LEVOTHROID) 88 MCG tablet TAKE 1 TABLET (88 MCG TOTAL) BY MOUTH DAILY BEFORE BREAKFAST. 09/03/16  Yes Renato Shin, MD  metoprolol tartrate (LOPRESSOR) 25 MG tablet TAKE 1 TABLET (25 MG TOTAL) BY MOUTH 2 (TWO) TIMES DAILY. 09/22/16  Yes Renato Shin, MD  ondansetron (ZOFRAN-ODT) 4 MG disintegrating tablet Take 1 tablet (4 mg total) by mouth every 8 (eight) hours as needed for nausea or vomiting. 01/02/17  Yes Armbruster, Carlota Raspberry, MD  rosuvastatin (CRESTOR) 10 MG tablet Take 5 mg by mouth daily.   Yes [provider]    Physical Exam:  Vitals:   02/22/17 1245 02/22/17 1300 02/22/17 1315 02/22/17 1330  BP: (!) 154/78 (!) 150/76 (!) 143/70 (!) 149/78  Pulse: 75  70 68  Resp: 14 16 20 16   Temp:      TempSrc:        SpO2: 100%  100% 100%   Constitutional: NAD, calm uncomfortable  due to pain Eyes: left eye leave with significant bruising, purple discoloration, no open areas, however very tender to palpation.She also has some bruising in the face. PERRLA ENMT: Mucous membranes are moist, without exudate or lesions . T of the tone has bruising as well. Neck: normal, supple, no masses, no thyromegaly Respiratory: clear to auscultation bilaterally, no wheezing, no crackles. Normal respiratory effort  Cardiovascular: Regular rate and rhythm,  2/6 murmur, rubs or gallops. No extremity edema. 2+ pedal pulses. No carotid bruits.  Abdomen: Soft, non tender, No hepatosplenomegaly. Bowel sounds positive.  Musculoskeletal: no clubbing / cyanosis.  Moves all extremities. Left wrist with some bruising. Skin: no jaundice, bruising as mentioned above. Neurologic: Sensation intact  Strength equal in all extremities Psychiatric:   Alert and oriented x 3. Normal mood.     Labs on Admission: I have personally reviewed following labs and imaging studies  CBC:  Recent Labs Lab 02/22/17 1240  WBC 8.3  NEUTROABS 6.0  HGB 11.1*  HCT 33.7*  MCV 90.1  PLT 638    Basic Metabolic Panel:  Recent Labs Lab 02/22/17 1240  NA 138  K 4.4  CL 109  CO2 20*  GLUCOSE 99  BUN 45*  CREATININE 3.88*  CALCIUM 9.4    GFR: CrCl cannot be calculated (Unknown ideal weight.).  Liver Function Tests: No results for input(s): AST, ALT, ALKPHOS, BILITOT, PROT, ALBUMIN in the last 168 hours. No results for input(s): LIPASE, AMYLASE in the last 168 hours. No results for input(s): AMMONIA in the last 168 hours.  Coagulation Profile: No results for input(s): INR, PROTIME in the last 168 hours.  Cardiac Enzymes: No results for input(s): CKTOTAL, CKMB, CKMBINDEX, TROPONINI in the last 168 hours.  BNP (last 3 results) No results for input(s): PROBNP in the last 8760 hours.  HbA1C: No results for input(s): HGBA1C in the  last 72 hours.  CBG:  Recent Labs Lab 02/22/17 1242  GLUCAP 87    Lipid Profile: No results for input(s): CHOL, HDL, LDLCALC, TRIG, CHOLHDL, LDLDIRECT in the last 72 hours.  Thyroid Function Tests: No results for input(s): TSH, T4TOTAL, FREET4, T3FREE, THYROIDAB in the last 72 hours.  Anemia Panel: No results for input(s): VITAMINB12, FOLATE, FERRITIN, TIBC, IRON, RETICCTPCT in the last 72 hours.  Urine analysis:    Component Value Date/Time   COLORURINE YELLOW 06/21/2016 [place] 06/21/2016 1714   LABSPEC 1.013 06/21/2016 1714   PHURINE 5.0 06/21/2016 1714   GLUCOSEU NEGATIVE 06/21/2016 1714   GLUCOSEU NEGATIVE 02/28/2016 0900   HGBUR SMALL (A) 06/21/2016 1714   BILIRUBINUR NEGATIVE 06/21/2016 1714   KETONESUR NEGATIVE 06/21/2016 1714   PROTEINUR 30 (A) 06/21/2016 1714   UROBILINOGEN 0.2 02/28/2016 0900   NITRITE NEGATIVE 06/21/2016 1714   LEUKOCYTESUR MODERATE (A) 06/21/2016 1714    Sepsis Labs: @LABRCNTIP (procalcitonin:4,lacticidven:4) )No results found for this or any previous visit (from the past 240 hour(s)).   Radiological Exams on Admission: Ct Head Wo Contrast  Result Date: 02/22/2017 CLINICAL DATA:  76 year old female with syncope and fall this morning with head, face and neck injury. Left facial pain and headache. EXAM: CT HEAD WITHOUT CONTRAST CT MAXILLOFACIAL WITHOUT CONTRAST CT CERVICAL SPINE WITHOUT CONTRAST TECHNIQUE: Multidetector CT imaging of the head, cervical spine, and maxillofacial structures were performed using the standard protocol without intravenous contrast. Multiplanar CT image reconstructions of the cervical spine and maxillofacial structures were also generated. COMPARISON:  03/03/2013 brain and cervical spine MR. 12/08/2004 head CT. FINDINGS: CT HEAD FINDINGS Brain: No evidence of acute infarction, hemorrhage, hydrocephalus, extra-axial collection or mass lesion/mass effect. Mild atrophy and mild chronic small-vessel white  matter ischemic changes noted. Vascular: Intracranial atherosclerotic calcifications noted. Skull: Normal. Negative for fracture or focal lesion. Other: Left forehead soft tissue swelling noted. CT MAXILLOFACIAL FINDINGS Osseous: No fracture or mandibular dislocation. No destructive process. Orbits: Negative. No traumatic or inflammatory finding. Sinuses: Clear. Soft tissues: Left forehead and facial soft tissue swelling noted. CT CERVICAL SPINE FINDINGS Alignment: Normal. Skull base and vertebrae: No acute fracture. No primary bone lesion or focal pathologic process. Soft tissues and spinal  canal: No prevertebral fluid or swelling. No visible canal hematoma. Disc levels: Multilevel degenerative disc disease/ spondylosis noted, mild to moderate from C4-C7. Upper chest: No acute abnormality Other: None IMPRESSION: 1. No evidence of acute intracranial abnormality. Atrophy and chronic small-vessel white matter ischemic changes. 2. Left facial and forehead soft tissue swelling without fracture. 3. No static evidence of acute injury to the cervical spine. Mild to moderate degenerative disc disease/spondylosis from C4-C7. Electronically Signed   By: Margarette Canada M.D.   On: 02/22/2017 14:53   Ct Cervical Spine Wo Contrast  Result Date: 02/22/2017 CLINICAL DATA:  76 year old female with syncope and fall this morning with head, face and neck injury. Left facial pain and headache. EXAM: CT HEAD WITHOUT CONTRAST CT MAXILLOFACIAL WITHOUT CONTRAST CT CERVICAL SPINE WITHOUT CONTRAST TECHNIQUE: Multidetector CT imaging of the head, cervical spine, and maxillofacial structures were performed using the standard protocol without intravenous contrast. Multiplanar CT image reconstructions of the cervical spine and maxillofacial structures were also generated. COMPARISON:  03/03/2013 brain and cervical spine MR. 12/08/2004 head CT. FINDINGS: CT HEAD FINDINGS Brain: No evidence of acute infarction, hemorrhage, hydrocephalus,  extra-axial collection or mass lesion/mass effect. Mild atrophy and mild chronic small-vessel white matter ischemic changes noted. Vascular: Intracranial atherosclerotic calcifications noted. Skull: Normal. Negative for fracture or focal lesion. Other: Left forehead soft tissue swelling noted. CT MAXILLOFACIAL FINDINGS Osseous: No fracture or mandibular dislocation. No destructive process. Orbits: Negative. No traumatic or inflammatory finding. Sinuses: Clear. Soft tissues: Left forehead and facial soft tissue swelling noted. CT CERVICAL SPINE FINDINGS Alignment: Normal. Skull base and vertebrae: No acute fracture. No primary bone lesion or focal pathologic process. Soft tissues and spinal canal: No prevertebral fluid or swelling. No visible canal hematoma. Disc levels: Multilevel degenerative disc disease/ spondylosis noted, mild to moderate from C4-C7. Upper chest: No acute abnormality Other: None IMPRESSION: 1. No evidence of acute intracranial abnormality. Atrophy and chronic small-vessel white matter ischemic changes. 2. Left facial and forehead soft tissue swelling without fracture. 3. No static evidence of acute injury to the cervical spine. Mild to moderate degenerative disc disease/spondylosis from C4-C7. Electronically Signed   By: Margarette Canada M.D.   On: 02/22/2017 14:53   Dg Chest Port 1 View  Result Date: 02/22/2017 CLINICAL DATA:  Syncope.  Fall EXAM: PORTABLE CHEST 1 VIEW COMPARISON:  06/25/2015 FINDINGS: Heart size upper normal. Normal pulmonary vascularity. Lungs are clear without infiltrate or effusion. IMPRESSION: No active disease. Electronically Signed   By: Franchot Gallo M.D.   On: 02/22/2017 13:26   Dg Hand Complete Left  Result Date: 02/22/2017 CLINICAL DATA:  Syncope.  Fall.  Hand pain EXAM: LEFT HAND - COMPLETE 3+ VIEW COMPARISON:  None. FINDINGS: Advanced degenerative change at the base of thumb with joint space narrowing and spurring. Extension deformity of the first MCP joint.  Osteoarthritis in the second and third DIP joints. Early chondrocalcinosis in the triangular fibrocartilage. Negative for fracture IMPRESSION: Negative for fracture. Electronically Signed   By: Franchot Gallo M.D.   On: 02/22/2017 13:27   Ct Maxillofacial Wo Contrast  Result Date: 02/22/2017 CLINICAL DATA:  76 year old female with syncope and fall this morning with head, face and neck injury. Left facial pain and headache. EXAM: CT HEAD WITHOUT CONTRAST CT MAXILLOFACIAL WITHOUT CONTRAST CT CERVICAL SPINE WITHOUT CONTRAST TECHNIQUE: Multidetector CT imaging of the head, cervical spine, and maxillofacial structures were performed using the standard protocol without intravenous contrast. Multiplanar CT image reconstructions of the cervical spine and maxillofacial structures  were also generated. COMPARISON:  03/03/2013 brain and cervical spine MR. 12/08/2004 head CT. FINDINGS: CT HEAD FINDINGS Brain: No evidence of acute infarction, hemorrhage, hydrocephalus, extra-axial collection or mass lesion/mass effect. Mild atrophy and mild chronic small-vessel white matter ischemic changes noted. Vascular: Intracranial atherosclerotic calcifications noted. Skull: Normal. Negative for fracture or focal lesion. Other: Left forehead soft tissue swelling noted. CT MAXILLOFACIAL FINDINGS Osseous: No fracture or mandibular dislocation. No destructive process. Orbits: Negative. No traumatic or inflammatory finding. Sinuses: Clear. Soft tissues: Left forehead and facial soft tissue swelling noted. CT CERVICAL SPINE FINDINGS Alignment: Normal. Skull base and vertebrae: No acute fracture. No primary bone lesion or focal pathologic process. Soft tissues and spinal canal: No prevertebral fluid or swelling. No visible canal hematoma. Disc levels: Multilevel degenerative disc disease/ spondylosis noted, mild to moderate from C4-C7. Upper chest: No acute abnormality Other: None IMPRESSION: 1. No evidence of acute intracranial abnormality.  Atrophy and chronic small-vessel white matter ischemic changes. 2. Left facial and forehead soft tissue swelling without fracture. 3. No static evidence of acute injury to the cervical spine. Mild to moderate degenerative disc disease/spondylosis from C4-C7. Electronically Signed   By: Margarette Canada M.D.   On: 02/22/2017 14:53    EKG: Independently reviewed.  Assessment/Plan Active Problems:   Syncope   Hypothyroidism   Pure hypercholesterolemia   GERD   Diverticulosis of colon   Osteoarthritis of spine   Palpitations   Avitaminosis D   Hyperparathyroidism due to renal insufficiency (HCC)   Arthritis of knee, degenerative   Cerebrovascular accident (CVA) ([place])   CKD (chronic kidney disease) stage 4, GFR 15-29 ml/min (H[facility])   Anemia, iron deficiency   Carotid artery disease ([place])   Syncope. Labs unrevealing, EKG with new RBBB  Neuro exam unremarkable. Afebrile. UA is pending   CT of the head was negative for acute intracranial abnormalities, or hemorrhage.CT of the C-spine And maxillary area was negative for fracture. Chest x-ray was negative. She received IV pain medications with some improvement in her symptoms. 8/18 Echo EF normal with normal left atrial size. Per patient report, she is to undergo PMP in am. Awaiting formal cardiac consult note  Urine pending Syncope order set  Fall precautions Observation Tele bed. 2 D echo Carotid Ultrasound Gentle IVF  EKG in am Hold Beta blockers and other BP meds For PMP in am as per her report, awaiting Cards consult note    Hypertension BP ( 149/78   Pulse 68   Continue home anti-hypertensive medications in am, took all her meds this am  Hold Lopressor today  Add Hydralazine Q6 hours as needed for BP 160/90   Hyperlipidemia Continue home statins  Hypothyroidism: Continue home Synthroid   Peripheral neuropathy Continue Neurontin   Chronic kidney disease stage  4   baseline creatinine 3.4, currently at 3.88, Not on diuretics  or ACE I  Lab Results  Component Value Date   CREATININE 3.88 (H) 02/22/2017   CREATININE 3.49 (H) 06/21/2016   CREATININE 3.23 (H) 02/28/2016  Gentle IVF  Repeat CMET in am   Anemia of chronic disease Hemoglobin on admission 11 at baseline, no significant overt bleeding issues  Repeat CBC in am  No transfusion is indicated at this time   Prior history of CVA, no acute issues. CT head is negative  COntinue ASA daily after tomorrow, took today's dose    DVT prophylaxis:  SCD  Code Status:    Full  Family Communication:  Discussed with patient Disposition  Plan: Expect patient to be discharged to home after condition improves Consults called:    Cardiology, Dr. Shaaron Adler  Admission status: Tele Obs    Rondel Jumbo, PA-C [facility]   02/22/2017, 4:11 PM

## 2017-02-22 NOTE — ED Triage Notes (Signed)
To room via EMS.  Pt singing in choir, felt lightheaded and fell face forward onto floor landing on left side of face.  LOC x 1 min.  Pt didn't remember falling.   4 upper crowns knocked out.  Abrasion to left forehead, left eye swollen, bruised.  Bruise noted to left cheek.

## 2017-02-22 NOTE — ED Triage Notes (Signed)
Pt c/o left hand/wrist pain, bruising noted. Bruising noted to RLE that pt states is not new.  Knee brace on left knee for chronic knee pain.

## 2017-02-22 NOTE — Consult Note (Addendum)
ELECTROPHYSIOLOGY CONSULT NOTE  Patient ID: Katrina Yang, MRN: [id number], DOB/AGE: June 08, 1940 76 y.o. Admit date: 02/22/2017 Date of Consult: 02/22/2017  Primary Physician: Renato Shin, MD Primary Cardiologist: [facility]     Katrina Yang is a 76 y.o. female who is being seen today for the evaluation of syncope at the request of S [place] . [facility]   HPI Katrina Yang is a 76 y.o. female admitted following an episode of syncope. This is her first episode. She was standing in church singing. She had brief lightheadedness, thought it would pass, and then lost consciousness.  She hit the floor with significant trauma but was unconscious for less than 10 seconds. She was immediately aware of her surroundings.   I had seenhe patient in consultation 8/18 for consideration of a loop recorder cryptogenic stroke as well as palpitations. She has been on a combination of aspirin and Plavix  She has no antecedent history of syncope or presyncope.  She denies problems with exercise intolerance. She's had no edema.   4/14 MRI abnormal-chronic microvascular ischemia 4/17 carotid Dopplers less than 39% obstruction 8/18 Echo EF normal with normal left atrial size  Her husband has leukemia  Past Medical History:  Diagnosis Date  . Carotid bruit    a.  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  . Cervical spondylosis without myelopathy 02/01/2015  . CKD (chronic kidney disease), stage IV ([place])   . Colonic polyp    hx of  . CVA (cerebral vascular accident) ([place])    hx of  . Degenerative joint disease   . Diverticulosis of colon (without mention of hemorrhage)   . GERD (gastroesophageal reflux disease)   . History of hysterectomy   . Hypertension    a. Renal artery dopplers in 2012 with no evidence for significant renal artery stenosis.   . Occipital neuralgia of left side 08/30/2013  . Osteoporosis, unspecified   . Palpitations    a. Event monitor (8/14) with no significant  abnormalities.   . Peripheral neuropathic pain   . Personal history of unspecified urinary disorder   . Pulmonary nodule   . Pure hypercholesterolemia   . Secondary hyperparathyroidism (of renal origin)   . Unspecified hypothyroidism       Surgical History:  Past Surgical History:  Procedure Laterality Date  . ABDOMINAL HYSTERECTOMY    . BACK SURGERY     diskectomy L-5. Multiple back surgeries  . MENISCECTOMY Left 11/14/15     Home Meds: Prior to Admission medications   Medication Sig Start Date End Date Taking? Authorizing Provider  amLODipine (NORVASC) 10 MG tablet Take 5 mg by mouth 2 (two) times daily.  06/19/15  Yes [provider]  aspirin EC 81 MG tablet Take 81 mg by mouth daily.   Yes [provider]  calcitRIOL (ROCALTROL) 0.25 MCG capsule Take 1 capsule (0.25 mcg total) by mouth daily. 12/18/16  Yes Renato Shin, MD  clopidogrel (PLAVIX) 75 MG tablet TAKE 1 TABLET (75 MG TOTAL) BY MOUTH DAILY. 01/15/17  Yes Kathrynn Ducking, MD  gabapentin (NEURONTIN) 100 MG capsule TAKE 1 CAPSULE (100 MG TOTAL) BY MOUTH DAILY. 10/20/16  Yes Kathrynn Ducking, MD  levothyroxine (SYNTHROID, LEVOTHROID) 88 MCG tablet TAKE 1 TABLET (88 MCG TOTAL) BY MOUTH DAILY BEFORE BREAKFAST. 09/03/16  Yes Renato Shin, MD  metoprolol tartrate (LOPRESSOR) 25 MG tablet TAKE 1 TABLET (25 MG TOTAL) BY MOUTH 2 (TWO) TIMES DAILY. 09/22/16  Yes Renato Shin, MD  ondansetron (ZOFRAN-ODT) 4 MG disintegrating tablet Take 1 tablet (4 mg total) by mouth every 8 (eight) hours as needed for nausea or vomiting. 01/02/17  Yes Armbruster, Carlota Raspberry, MD  rosuvastatin (CRESTOR) 10 MG tablet Take 5 mg by mouth daily.   Yes [provider]    Allergies:  Allergies  Allergen Reactions  . Iodinated Diagnostic Agents Swelling and Anaphylaxis    Throat swelling  . Metronidazole Nausea And Vomiting  . Latex Rash  . Sulfonamide Derivatives     Unknown allergic reaction  . Codeine Rash and Nausea And  Vomiting  . Lovastatin Nausea Only    Unknown reaction  . Sulfa Antibiotics Rash  . Sulfamethoxazole-Trimethoprim Nausea Only    Social History   Social History  . Marital status: Married    Spouse name: [name]  . Number of children: 1  . Years of education: 2   Occupational History  . 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 Smoking status: Never Smoker  . Smokeless tobacco: Never Used  . Alcohol use No  . Drug use: No  . Sexual activity: Not on file   Other Topics Concern  . Not on file   Social History Narrative   Pt lives at home with her spouse.   Patient drinks 1 glass of tea every other day.   Patient is right handed.     Family History  Problem Relation Age of Onset  . Kidney cancer Father   . Heart attack Father   . Heart disease Father   . Colon cancer Mother   . Ovarian cancer Mother   . Heart disease Mother   . Heart failure Mother   . Stomach cancer Neg Hx      ROS:  Please see the history of present illness.     All other systems reviewed and negative.    Physical Exam: Blood pressure (!) 149/78, pulse 68, temperature (!) 97.5 F (36.4 C), temperature source Oral, resp. rate 16, SpO2 100 %. General: Well developed, well nourished female in no acute distress. Head: Normocephalic, sclera non-icteric, no xanthomas, nares are without discharge. She has ecchymoses over her left orbit; her teeth are missing.  EENT: normal  Lymph Nodes:  none Neck: Negative for carotid bruits. JVD not elevated. carotid massage was negative with a positive only 2 seconds  Back:without scoliosis kyphosis Lungs: Clear bilaterally to auscultation without wheezes, rales, or rhonchi. Breathing is unlabored. Heart: RRR with S1 S2.  2/6 systolic murmur . No rubs, or gallops appreciated. Abdomen: Soft, non-tender, non-distended with normoactive bowel sounds. No hepatomegaly. No rebound/guarding. No obvious abdominal masses. Msk:  Strength  and tone appear normal for age. Extremities: No clubbing or cyanosis. No** edema.  Distal pedal pulses are 2+ and equal bilaterally. There is bruising over her left forearm  Skin: Warm and Dry Neuro: Alert and oriented X 3. CN III-XII intact Grossly normal sensory and motor function . Psych:  Responds to questions appropriately with a normal affect.      Labs: Cardiac Enzymes No results for input(s): CKTOTAL, CKMB, TROPONINI in the last 72 hours. CBC Lab Results  Component Value Date   WBC 8.3 02/22/2017   HGB 11.1 (L) 02/22/2017   HCT 33.7 (L) 02/22/2017   MCV 90.1 02/22/2017   PLT 292 02/22/2017   PROTIME: No results for input(s): LABPROT, INR in the last 72 hours. Chemistry  Recent Labs Lab 02/22/17 1240  NA  138  K 4.4  CL 109  CO2 20*  BUN 45*  CREATININE 3.88*  CALCIUM 9.4  GLUCOSE 99   Lipids Lab Results  Component Value Date   CHOL 214 (H) 02/28/2016   HDL 106.10 02/28/2016   LDLCALC 96 02/28/2016   TRIG 62.0 02/28/2016   BNP No results found for: PROBNP Thyroid Function Tests: No results for input(s): TSH, T4TOTAL, T3FREE, THYROIDAB in the last 72 hours.  Invalid input(s): FREET3 Miscellaneous Lab Results  Component Value Date   DDIMER 0.88 (H) 07/21/2012    Radiology/Studies:  Ct Head Wo Contrast  Result Date: 02/22/2017 CLINICAL DATA:  76 year old female with syncope and fall this morning with head, face and neck injury. Left facial pain and headache. EXAM: CT HEAD WITHOUT CONTRAST CT MAXILLOFACIAL WITHOUT CONTRAST CT CERVICAL SPINE WITHOUT CONTRAST TECHNIQUE: Multidetector CT imaging of the head, cervical spine, and maxillofacial structures were performed using the standard protocol without intravenous contrast. Multiplanar CT image reconstructions of the cervical spine and maxillofacial structures were also generated. COMPARISON:  03/03/2013 brain and cervical spine MR. 12/08/2004 head CT. FINDINGS: CT HEAD FINDINGS Brain: No evidence of acute  infarction, hemorrhage, hydrocephalus, extra-axial collection or mass lesion/mass effect. Mild atrophy and mild chronic small-vessel white matter ischemic changes noted. Vascular: Intracranial atherosclerotic calcifications noted. Skull: Normal. Negative for fracture or focal lesion. Other: Left forehead soft tissue swelling noted. CT MAXILLOFACIAL FINDINGS Osseous: No fracture or mandibular dislocation. No destructive process. Orbits: Negative. No traumatic or inflammatory finding. Sinuses: Clear. Soft tissues: Left forehead and facial soft tissue swelling noted. CT CERVICAL SPINE FINDINGS Alignment: Normal. Skull base and vertebrae: No acute fracture. No primary bone lesion or focal pathologic process. Soft tissues and spinal canal: No prevertebral fluid or swelling. No visible canal hematoma. Disc levels: Multilevel degenerative disc disease/ spondylosis noted, mild to moderate from C4-C7. Upper chest: No acute abnormality Other: None IMPRESSION: 1. No evidence of acute intracranial abnormality. Atrophy and chronic small-vessel white matter ischemic changes. 2. Left facial and forehead soft tissue swelling without fracture. 3. No static evidence of acute injury to the cervical spine. Mild to moderate degenerative disc disease/spondylosis from C4-C7. Electronically Signed   By: Margarette Canada M.D.   On: 02/22/2017 14:53   Ct Cervical Spine Wo Contrast  Result Date: 02/22/2017 CLINICAL DATA:  76 year old female with syncope and fall this morning with head, face and neck injury. Left facial pain and headache. EXAM: CT HEAD WITHOUT CONTRAST CT MAXILLOFACIAL WITHOUT CONTRAST CT CERVICAL SPINE WITHOUT CONTRAST TECHNIQUE: Multidetector CT imaging of the head, cervical spine, and maxillofacial structures were performed using the standard protocol without intravenous contrast. Multiplanar CT image reconstructions of the cervical spine and maxillofacial structures were also generated. COMPARISON:  03/03/2013 brain and  cervical spine MR. 12/08/2004 head CT. FINDINGS: CT HEAD FINDINGS Brain: No evidence of acute infarction, hemorrhage, hydrocephalus, extra-axial collection or mass lesion/mass effect. Mild atrophy and mild chronic small-vessel white matter ischemic changes noted. Vascular: Intracranial atherosclerotic calcifications noted. Skull: Normal. Negative for fracture or focal lesion. Other: Left forehead soft tissue swelling noted. CT MAXILLOFACIAL FINDINGS Osseous: No fracture or mandibular dislocation. No destructive process. Orbits: Negative. No traumatic or inflammatory finding. Sinuses: Clear. Soft tissues: Left forehead and facial soft tissue swelling noted. CT CERVICAL SPINE FINDINGS Alignment: Normal. Skull base and vertebrae: No acute fracture. No primary bone lesion or focal pathologic process. Soft tissues and spinal canal: No prevertebral fluid or swelling. No visible canal hematoma. Disc levels: Multilevel degenerative disc disease/ spondylosis noted,  mild to moderate from C4-C7. Upper chest: No acute abnormality Other: None IMPRESSION: 1. No evidence of acute intracranial abnormality. Atrophy and chronic small-vessel white matter ischemic changes. 2. Left facial and forehead soft tissue swelling without fracture. 3. No static evidence of acute injury to the cervical spine. Mild to moderate degenerative disc disease/spondylosis from C4-C7. Electronically Signed   By: Margarette Canada M.D.   On: 02/22/2017 14:53   Dg Chest Port 1 View  Result Date: 02/22/2017 CLINICAL DATA:  Syncope.  Fall EXAM: PORTABLE CHEST 1 VIEW COMPARISON:  06/25/2015 FINDINGS: Heart size upper normal. Normal pulmonary vascularity. Lungs are clear without infiltrate or effusion. IMPRESSION: No active disease. Electronically Signed   By: Franchot Gallo M.D.   On: 02/22/2017 13:26   Dg Hand Complete Left  Result Date: 02/22/2017 CLINICAL DATA:  Syncope.  Fall.  Hand pain EXAM: LEFT HAND - COMPLETE 3+ VIEW COMPARISON:  None. FINDINGS:  Advanced degenerative change at the base of thumb with joint space narrowing and spurring. Extension deformity of the first MCP joint. Osteoarthritis in the second and third DIP joints. Early chondrocalcinosis in the triangular fibrocartilage. Negative for fracture IMPRESSION: Negative for fracture. Electronically Signed   By: Franchot Gallo M.D.   On: 02/22/2017 13:27   Ct Maxillofacial Wo Contrast  Result Date: 02/22/2017 CLINICAL DATA:  76 year old female with syncope and fall this morning with head, face and neck injury. Left facial pain and headache. EXAM: CT HEAD WITHOUT CONTRAST CT MAXILLOFACIAL WITHOUT CONTRAST CT CERVICAL SPINE WITHOUT CONTRAST TECHNIQUE: Multidetector CT imaging of the head, cervical spine, and maxillofacial structures were performed using the standard protocol without intravenous contrast. Multiplanar CT image reconstructions of the cervical spine and maxillofacial structures were also generated. COMPARISON:  03/03/2013 brain and cervical spine MR. 12/08/2004 head CT. FINDINGS: CT HEAD FINDINGS Brain: No evidence of acute infarction, hemorrhage, hydrocephalus, extra-axial collection or mass lesion/mass effect. Mild atrophy and mild chronic small-vessel white matter ischemic changes noted. Vascular: Intracranial atherosclerotic calcifications noted. Skull: Normal. Negative for fracture or focal lesion. Other: Left forehead soft tissue swelling noted. CT MAXILLOFACIAL FINDINGS Osseous: No fracture or mandibular dislocation. No destructive process. Orbits: Negative. No traumatic or inflammatory finding. Sinuses: Clear. Soft tissues: Left forehead and facial soft tissue swelling noted. CT CERVICAL SPINE FINDINGS Alignment: Normal. Skull base and vertebrae: No acute fracture. No primary bone lesion or focal pathologic process. Soft tissues and spinal canal: No prevertebral fluid or swelling. No visible canal hematoma. Disc levels: Multilevel degenerative disc disease/ spondylosis noted,  mild to moderate from C4-C7. Upper chest: No acute abnormality Other: None IMPRESSION: 1. No evidence of acute intracranial abnormality. Atrophy and chronic small-vessel white matter ischemic changes. 2. Left facial and forehead soft tissue swelling without fracture. 3. No static evidence of acute injury to the cervical spine. Mild to moderate degenerative disc disease/spondylosis from C4-C7. Electronically Signed   By: Margarette Canada M.D.   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 This is likely arrhythmic syncope and the brevity of the recovery phase, not withstanding the context I.e in  [place], strongly speaks against a neurally mediated event.  Based on  the PRESS TRIAL is reasonable/recommended and within guidelines (2A) to proceed with pacing. I have reviewed this with the patient and her husband and daughter. They are in agreement. We have discussed risks and benefits  This will also afford [place] information related to her palpitations as well as atrial fibrillation given her prior history of cryptogenic stroke.       Virl Axe

## 2017-02-23 ENCOUNTER — Observation Stay (HOSPITAL_BASED_OUTPATIENT_CLINIC_OR_DEPARTMENT_OTHER): Payer: Medicare Other

## 2017-02-23 ENCOUNTER — Observation Stay (HOSPITAL_COMMUNITY): Payer: Medicare Other

## 2017-02-23 ENCOUNTER — Ambulatory Visit (HOSPITAL_COMMUNITY): Admit: 2017-02-23 | Payer: Medicare Other | Admitting: Internal Medicine

## 2017-02-23 ENCOUNTER — Encounter (HOSPITAL_COMMUNITY)
Admission: EM | Disposition: A | Discharge status: Home / Self Care | Payer: Self-pay | Source: Home / Self Care | Attending: Internal Medicine

## 2017-02-23 DIAGNOSIS — E038 Other specified hypothyroidism: Secondary | ICD-10-CM | POA: Diagnosis present

## 2017-02-23 DIAGNOSIS — R55 Syncope and collapse: Secondary | ICD-10-CM

## 2017-02-23 DIAGNOSIS — E1142 Type 2 diabetes mellitus with diabetic polyneuropathy: Secondary | ICD-10-CM | POA: Diagnosis not present

## 2017-02-23 DIAGNOSIS — Z7982 Long term (current) use of aspirin: Secondary | ICD-10-CM | POA: Diagnosis not present

## 2017-02-23 DIAGNOSIS — S098XXA Other specified injuries of head, initial encounter: Secondary | ICD-10-CM | POA: Diagnosis present

## 2017-02-23 DIAGNOSIS — I639 Cerebral infarction, unspecified: Secondary | ICD-10-CM | POA: Diagnosis not present

## 2017-02-23 DIAGNOSIS — M1712 Unilateral primary osteoarthritis, left knee: Secondary | ICD-10-CM

## 2017-02-23 DIAGNOSIS — E785 Hyperlipidemia, unspecified: Secondary | ICD-10-CM | POA: Diagnosis present

## 2017-02-23 DIAGNOSIS — Y9222 Religious institution as the place of occurrence of the external cause: Secondary | ICD-10-CM | POA: Diagnosis not present

## 2017-02-23 DIAGNOSIS — I779 Disorder of arteries and arterioles, unspecified: Secondary | ICD-10-CM | POA: Diagnosis not present

## 2017-02-23 DIAGNOSIS — E039 Hypothyroidism, unspecified: Secondary | ICD-10-CM | POA: Diagnosis not present

## 2017-02-23 DIAGNOSIS — N179 Acute kidney failure, unspecified: Secondary | ICD-10-CM | POA: Diagnosis not present

## 2017-02-23 DIAGNOSIS — I129 Hypertensive chronic kidney disease with stage 1 through stage 4 chronic kidney disease, or unspecified chronic kidney disease: Secondary | ICD-10-CM | POA: Diagnosis not present

## 2017-02-23 DIAGNOSIS — S0083XA Contusion of other part of head, initial encounter: Secondary | ICD-10-CM | POA: Diagnosis not present

## 2017-02-23 DIAGNOSIS — I2510 Atherosclerotic heart disease of native coronary artery without angina pectoris: Secondary | ICD-10-CM | POA: Diagnosis present

## 2017-02-23 DIAGNOSIS — K219 Gastro-esophageal reflux disease without esophagitis: Secondary | ICD-10-CM | POA: Diagnosis not present

## 2017-02-23 DIAGNOSIS — Z7902 Long term (current) use of antithrombotics/antiplatelets: Secondary | ICD-10-CM | POA: Diagnosis not present

## 2017-02-23 DIAGNOSIS — Z8673 Personal history of transient ischemic attack (TIA), and cerebral infarction without residual deficits: Secondary | ICD-10-CM | POA: Diagnosis not present

## 2017-02-23 DIAGNOSIS — N184 Chronic kidney disease, stage 4 (severe): Secondary | ICD-10-CM | POA: Diagnosis not present

## 2017-02-23 DIAGNOSIS — S0993XA Unspecified injury of face, initial encounter: Secondary | ICD-10-CM | POA: Diagnosis not present

## 2017-02-23 DIAGNOSIS — R001 Bradycardia, unspecified: Secondary | ICD-10-CM | POA: Diagnosis present

## 2017-02-23 DIAGNOSIS — D509 Iron deficiency anemia, unspecified: Secondary | ICD-10-CM

## 2017-02-23 DIAGNOSIS — Z8249 Family history of ischemic heart disease and other diseases of the circulatory system: Secondary | ICD-10-CM | POA: Diagnosis not present

## 2017-02-23 DIAGNOSIS — Z950 Presence of cardiac pacemaker: Secondary | ICD-10-CM | POA: Diagnosis not present

## 2017-02-23 DIAGNOSIS — W1830XA Fall on same level, unspecified, initial encounter: Secondary | ICD-10-CM | POA: Diagnosis present

## 2017-02-23 DIAGNOSIS — D631 Anemia in chronic kidney disease: Secondary | ICD-10-CM | POA: Diagnosis not present

## 2017-02-23 DIAGNOSIS — R002 Palpitations: Secondary | ICD-10-CM | POA: Diagnosis present

## 2017-02-23 DIAGNOSIS — Y9389 Activity, other specified: Secondary | ICD-10-CM | POA: Diagnosis not present

## 2017-02-23 DIAGNOSIS — I452 Bifascicular block: Secondary | ICD-10-CM | POA: Diagnosis not present

## 2017-02-23 DIAGNOSIS — E7800 Pure hypercholesterolemia, unspecified: Secondary | ICD-10-CM | POA: Diagnosis present

## 2017-02-23 DIAGNOSIS — K08119 Complete loss of teeth due to trauma, unspecified class: Secondary | ICD-10-CM | POA: Diagnosis present

## 2017-02-23 DIAGNOSIS — Z79899 Other long term (current) drug therapy: Secondary | ICD-10-CM | POA: Diagnosis not present

## 2017-02-23 DIAGNOSIS — N2581 Secondary hyperparathyroidism of renal origin: Secondary | ICD-10-CM | POA: Diagnosis not present

## 2017-02-23 HISTORY — PX: PACEMAKER IMPLANT: EP1218

## 2017-02-23 LAB — COMPREHENSIVE METABOLIC PANEL
ALBUMIN: 3.2 g/dL — AB (ref 3.5–5.0)
ALK PHOS: 56 U/L (ref 38–126)
ALT: 13 U/L — ABNORMAL LOW (ref 14–54)
ANION GAP: 6 (ref 5–15)
AST: 22 U/L (ref 15–41)
BILIRUBIN TOTAL: 0.6 mg/dL (ref 0.3–1.2)
BUN: 37 mg/dL — AB (ref 6–20)
CO2: 21 mmol/L — AB (ref 22–32)
Calcium: 9 mg/dL (ref 8.9–10.3)
Chloride: 112 mmol/L — ABNORMAL HIGH (ref 101–111)
Creatinine, Ser: 3.45 mg/dL — ABNORMAL HIGH (ref 0.44–1.00)
GFR calc Af Amer: 14 mL/min — ABNORMAL LOW (ref >60)
GFR calc non Af Amer: 12 mL/min — ABNORMAL LOW (ref >60)
GLUCOSE: 94 mg/dL (ref 65–99)
POTASSIUM: 3.9 mmol/L (ref 3.5–5.1)
SODIUM: 139 mmol/L (ref 135–145)
TOTAL PROTEIN: 6.2 g/dL — AB (ref 6.5–8.1)

## 2017-02-23 LAB — ECHOCARDIOGRAM COMPLETE
Ao-asc: 31 cm
Area-P 1/2: 2.89 cm2
CHL CUP MV DEC (S): 261
E decel time: 261 msec
EERAT: 8.38
FS: 36 % (ref 28–44)
Height: 63 in
IVS/LV PW RATIO, ED: 1
LA ID, A-P, ES: 37 mm
LA diam end sys: 37 mm
LA diam index: 2.42 cm/m2
LA vol: 44.2 mL
LAVOLA4C: 37.9 mL
LAVOLIN: 28.9 mL/m2
LDCA: 2.84 cm2
LV E/e' medial: 8.38
LV E/e'average: 8.38
LV PW d: 10 mm — AB (ref 0.6–1.1)
LV TDI E'LATERAL: 9.2
LV TDI E'MEDIAL: 7.83
LVELAT: 9.2 cm/s
LVOTD: 19 mm
Lateral S' vel: 11.7 cm/s
MV pk A vel: 118 m/s
MV pk E vel: 77.1 m/s
MVPG: 2 mmHg
P 1/2 time: 76 ms
TAPSE: 26.4 mm
Weight: 1859.2 oz

## 2017-02-23 LAB — CBC
HEMATOCRIT: 30 % — AB (ref 36.0–46.0)
HEMOGLOBIN: 9.4 g/dL — AB (ref 12.0–15.0)
MCH: 28.5 pg (ref 26.0–34.0)
MCHC: 31.3 g/dL (ref 30.0–36.0)
MCV: 90.9 fL (ref 78.0–100.0)
Platelets: 277 10*3/uL (ref 150–400)
RBC: 3.3 MIL/uL — ABNORMAL LOW (ref 3.87–5.11)
RDW: 14.2 % (ref 11.5–15.5)
WBC: 7.3 10*3/uL (ref 4.0–10.5)

## 2017-02-23 LAB — PROTIME-INR
INR: 1.03
PROTHROMBIN TIME: 13.4 s (ref 11.4–15.2)

## 2017-02-23 LAB — GLUCOSE, CAPILLARY: Glucose-Capillary: 93 mg/dL (ref 65–99)

## 2017-02-23 LAB — SURGICAL PCR SCREEN
MRSA, PCR: NEGATIVE
Staphylococcus aureus: NEGATIVE

## 2017-02-23 SURGERY — PACEMAKER IMPLANT

## 2017-02-23 MED ORDER — MIDAZOLAM HCL 5 MG/5ML IJ SOLN
INTRAMUSCULAR | Status: AC
  Filled 2017-02-23: qty 5

## 2017-02-23 MED ORDER — GENTAMICIN SULFATE 40 MG/ML IJ SOLN
INTRAMUSCULAR | Status: AC
  Filled 2017-02-23: qty 2

## 2017-02-23 MED ORDER — FENTANYL CITRATE (PF) 100 MCG/2ML IJ SOLN
INTRAMUSCULAR | Status: AC
  Filled 2017-02-23: qty 2

## 2017-02-23 MED ORDER — MIDAZOLAM HCL 5 MG/5ML IJ SOLN
INTRAMUSCULAR | Status: DC | PRN
  Administered 2017-02-23 (×2): 1 mg via INTRAVENOUS

## 2017-02-23 MED ORDER — BUPIVACAINE HCL (PF) 0.25 % IJ SOLN
INTRAMUSCULAR | Status: AC
  Filled 2017-02-23: qty 60

## 2017-02-23 MED ORDER — HEPARIN (PORCINE) IN NACL 2-0.9 UNIT/ML-% IJ SOLN
INTRAMUSCULAR | Status: AC
  Filled 2017-02-23: qty 500

## 2017-02-23 MED ORDER — FENTANYL CITRATE (PF) 100 MCG/2ML IJ SOLN
INTRAMUSCULAR | Status: DC | PRN
  Administered 2017-02-23 (×2): 12.5 ug via INTRAVENOUS

## 2017-02-23 MED ORDER — BUPIVACAINE HCL (PF) 0.25 % IJ SOLN
INTRAMUSCULAR | Status: DC | PRN
  Administered 2017-02-23: 60 mL

## 2017-02-23 MED ORDER — SODIUM CHLORIDE 0.9 % IV SOLN
INTRAVENOUS | Status: AC | PRN
  Administered 2017-02-23: 250 mL via INTRAVENOUS

## 2017-02-23 MED ORDER — IOPAMIDOL (ISOVUE-370) INJECTION 76%
INTRAVENOUS | Status: AC
  Filled 2017-02-23: qty 50

## 2017-02-23 MED ORDER — DIPHENHYDRAMINE HCL 50 MG/ML IJ SOLN
25.0000 mg | INTRAMUSCULAR | Status: AC
  Administered 2017-02-23: 25 mg via INTRAVENOUS
  Filled 2017-02-23: qty 1

## 2017-02-23 MED ORDER — CEFAZOLIN SODIUM-DEXTROSE 2-4 GM/100ML-% IV SOLN
INTRAVENOUS | Status: AC
  Filled 2017-02-23: qty 100

## 2017-02-23 MED ORDER — METHYLPREDNISOLONE SODIUM SUCC 125 MG IJ SOLR
125.0000 mg | INTRAMUSCULAR | Status: AC
  Administered 2017-02-23: 125 mg via INTRAVENOUS
  Filled 2017-02-23: qty 2

## 2017-02-23 MED ORDER — HEPARIN (PORCINE) IN NACL 2-0.9 UNIT/ML-% IJ SOLN
INTRAMUSCULAR | Status: AC | PRN
  Administered 2017-02-23: 500 mL

## 2017-02-23 MED ORDER — IOPAMIDOL (ISOVUE-370) INJECTION 76%
INTRAVENOUS | Status: DC | PRN
  Administered 2017-02-23: 5 mL via INTRAVENOUS

## 2017-02-23 MED ORDER — FAMOTIDINE IN NACL 20-0.9 MG/50ML-% IV SOLN
20.0000 mg | INTRAVENOUS | Status: AC
  Administered 2017-02-23: 20 mg via INTRAVENOUS
  Filled 2017-02-23: qty 50

## 2017-02-23 SURGICAL SUPPLY — 7 items
CABLE SURGICAL S-101-97-12 (CABLE) ×1 IMPLANT
LEAD TENDRIL MRI 46CM LPA1200M (Lead) ×1 IMPLANT
LEAD TENDRIL MRI 52CM LPA1200M (Lead) ×1 IMPLANT
PACEMAKER ASSURITY DR-RF (Pacemaker) ×1 IMPLANT
PAD DEFIB LIFELINK (PAD) ×1 IMPLANT
SHEATH CLASSIC 8F (SHEATH) ×2 IMPLANT
TRAY PACEMAKER INSERTION (PACKS) ×1 IMPLANT

## 2017-02-23 NOTE — Interval H&P Note (Signed)
History and Physical Interval Note:  02/23/2017 3:22 PM  Katrina Yang  has presented today for surgery, with the diagnosis of syncope - hb  The various methods of treatment have been discussed with the patient and family. After consideration of risks, benefits and other options for treatment, the patient has consented to  Procedure(s): PACEMAKER IMPLANT (N/A) as a surgical intervention .  The patient's history has been reviewed, patient examined, no change in status, stable for surgery.  I have reviewed the patient's chart and labs.  Questions were answered to the patient's satisfaction.     Katrina Yang

## 2017-02-23 NOTE — Progress Notes (Signed)
VASCULAR LAB PRELIMINARY  PRELIMINARY  PRELIMINARY  PRELIMINARY  Carotid duplex completed.    Preliminary report:  1-39% ICA plaquing. Vertebral artery flow is antegrade.   Cina Klumpp, RVT 02/23/2017, 7:54 AM

## 2017-02-23 NOTE — Progress Notes (Signed)
  Echocardiogram 2D Echocardiogram has been performed.  Katrina Yang 02/23/2017, 8:46 AM

## 2017-02-23 NOTE — Progress Notes (Signed)
Pt is alert and oriented with her spouse at bedside. Has order for pacemaker placement. She refused to sign the consent because she wants the MD to educate her on the procedure, the complications/risks and benefits. Will pass it on to the oncoming nurse.

## 2017-02-23 NOTE — Progress Notes (Signed)
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  Syncope most likely from arrhythmia due to due to no warning signs, quick recovery, and no post ictal like symptoms.  Cardiology following and is planning pacemaker placement.  This morning, the patient reports she feels well despite soreness in her left thigh from her fall. She denies any chest pain, dyspnea, or palpitations.  Exam:   General:-76year-old female, A&O x4; missing frontal teeth from fall.  Cardiovascular: RRR with no murmurs, rubs or gallops  Respiratory: CTA with no wheezes or ronchi  Abdomen: Soft NT ND with NBS x 4 quadrants  Musculoskeletal: Moves all 4 limbs freely  Skin: Bruising around left eye and left lateral thigh  Psychiatry: Mood is appropriate for condition and settings.    Objective: Vitals:   02/22/17 1829 02/22/17 1925 02/23/17 0039 02/23/17 0539  BP: (!) 159/77 (!) 145/67 132/61 (!) 144/69  Pulse: 78 80 68 83  Resp:  18 18 18   Temp: 98.2 F (36.8 C) 98.3 F (36.8 C) 98.7 F (37.1 C) 98.7 F (37.1 C)  TempSrc: Oral Oral Oral Oral  SpO2: 98% 96% 94% 97%  Weight: 52.6 kg (115 lb 14.4 oz)   52.7 kg (116 lb 3.2 oz)  Height: 5\' 3"  (1.6 m)       Intake/Output Summary (Last 24 hours) at 02/23/17 1014 Last data filed at 02/23/17 0904  Gross per 24 hour  Intake           661.25 ml  Output              600 ml  Net            61.25 ml   Filed Weights   02/22/17 1829 02/23/17 0539  Weight: 52.6 kg (115 lb 14.4 oz) 52.7 kg (116 lb 3.2 oz)     Data Reviewed: CBC:  Recent  Labs Lab 02/22/17 1240 02/23/17 0627  WBC 8.3 7.3  NEUTROABS 6.0  --   HGB 11.1* 9.4*  HCT 33.7* 30.0*  MCV 90.1 90.9  PLT 292 564   Basic Metabolic Panel:  Recent Labs Lab 02/22/17 1240 02/23/17 0627  NA 138 139  K 4.4 3.9  CL 109 112*  CO2 20* 21*  GLUCOSE 99 94  BUN 45* 37*  CREATININE 3.88* 3.45*  CALCIUM 9.4 9.0   GFR: Estimated Creatinine Clearance: 11.7 mL/min (A) (by C-G formula based on SCr of 3.45 mg/dL (H)). Liver Function Tests:  Recent Labs Lab 02/23/17 0627  AST 22  ALT 13*  ALKPHOS 56  BILITOT 0.6  PROT 6.2*  ALBUMIN 3.2*   No results for input(s): LIPASE, AMYLASE in the last 168 hours. No results for input(s): AMMONIA in the last 168 hours. Coagulation Profile:  Recent Labs Lab 02/23/17 0627  INR 1.03   Cardiac Enzymes:  Recent Labs Lab 02/22/17 1622 02/22/17 1851 02/22/17 2122  TROPONINI <0.03 <0.03 <0.03   BNP (last 3 results) No results for input(s): PROBNP in the last 8760 hours. HbA1C: No results for input(s): HGBA1C in the last 72 hours. CBG:  Recent Labs Lab 02/22/17 1242 02/23/17 0541  GLUCAP 87  93   Lipid Profile: No results for input(s): CHOL, HDL, LDLCALC, TRIG, CHOLHDL, LDLDIRECT in the last 72 hours. Thyroid Function Tests:  Recent Labs  02/22/17 1622  TSH 0.203*   Anemia Panel: No results for input(s): VITAMINB12, FOLATE, FERRITIN, TIBC, IRON, RETICCTPCT in the last 72 hours. Urine analysis:    Component Value Date/Time   COLORURINE STRAW (A) 02/22/2017 2220   APPEARANCEUR CLEAR 02/22/2017 2220   LABSPEC 1.010 02/22/2017 2220   PHURINE 6.0 02/22/2017 2220   GLUCOSEU 50 (A) 02/22/2017 2220   GLUCOSEU NEGATIVE 02/28/2016 0900   HGBUR NEGATIVE 02/22/2017 2220   BILIRUBINUR NEGATIVE 02/22/2017 2220   KETONESUR NEGATIVE 02/22/2017 2220   PROTEINUR NEGATIVE 02/22/2017 2220   UROBILINOGEN 0.2 02/28/2016 0900   NITRITE NEGATIVE 02/22/2017 2220   LEUKOCYTESUR NEGATIVE 02/22/2017 2220   Sepsis  Labs: @LABRCNTIP (procalcitonin:4,lacticidven:4)  ) Recent Results (from the past 240 hour(s))  Surgical pcr screen     Status: None   Collection Time: 02/22/17  9:31 PM  Result Value Ref Range Status   MRSA, PCR NEGATIVE NEGATIVE Final   Staphylococcus aureus NEGATIVE NEGATIVE Final    Comment: (NOTE) The Xpert SA Assay (FDA approved for NASAL specimens in patients 66 years of age and older), is one component of a comprehensive surveillance program. It is not intended to diagnose infection nor to guide or monitor treatment.       Studies: X-ray Chest Pa And Lateral  Result Date: 02/23/2017 CLINICAL DATA:  Recent syncopal episode EXAM: CHEST  2 VIEW COMPARISON:  02/22/2017 FINDINGS: Cardiac shadow is stable. The lungs are well aerated bilaterally. No focal infiltrate or sizable effusion is seen. Mild degenerative changes of the thoracic spine are noted. IMPRESSION: No acute abnormality noted. Electronically Signed   By: Inez Catalina M.D.   On: 02/23/2017 07:46   Ct Head Wo Contrast  Result Date: 02/22/2017 CLINICAL DATA:  76 year old female with syncope and fall this morning with head, face and neck injury. Left facial pain and headache. EXAM: CT HEAD WITHOUT CONTRAST CT MAXILLOFACIAL WITHOUT CONTRAST CT CERVICAL SPINE WITHOUT CONTRAST TECHNIQUE: Multidetector CT imaging of the head, cervical spine, and maxillofacial structures were performed using the standard protocol without intravenous contrast. Multiplanar CT image reconstructions of the cervical spine and maxillofacial structures were also generated. COMPARISON:  03/03/2013 brain and cervical spine MR. 12/08/2004 head CT. FINDINGS: CT HEAD FINDINGS Brain: No evidence of acute infarction, hemorrhage, hydrocephalus, extra-axial collection or mass lesion/mass effect. Mild atrophy and mild chronic small-vessel white matter ischemic changes noted. Vascular: Intracranial atherosclerotic calcifications noted. Skull: Normal. Negative for  fracture or focal lesion. Other: Left forehead soft tissue swelling noted. CT MAXILLOFACIAL FINDINGS Osseous: No fracture or mandibular dislocation. No destructive process. Orbits: Negative. No traumatic or inflammatory finding. Sinuses: Clear. Soft tissues: Left forehead and facial soft tissue swelling noted. CT CERVICAL SPINE FINDINGS Alignment: Normal. Skull base and vertebrae: No acute fracture. No primary bone lesion or focal pathologic process. Soft tissues and spinal canal: No prevertebral fluid or swelling. No visible canal hematoma. Disc levels: Multilevel degenerative disc disease/ spondylosis noted, mild to moderate from C4-C7. Upper chest: No acute abnormality Other: None IMPRESSION: 1. No evidence of acute intracranial abnormality. Atrophy and chronic small-vessel white matter ischemic changes. 2. Left facial and forehead soft tissue swelling without fracture. 3. No static evidence of acute injury to the cervical spine. Mild to moderate degenerative disc disease/spondylosis from C4-C7. Electronically Signed   By: Margarette Canada M.D.   On: 02/22/2017 14:53  Ct Cervical Spine Wo Contrast  Result Date: 02/22/2017 CLINICAL DATA:  76 year old female with syncope and fall this morning with head, face and neck injury. Left facial pain and headache. EXAM: CT HEAD WITHOUT CONTRAST CT MAXILLOFACIAL WITHOUT CONTRAST CT CERVICAL SPINE WITHOUT CONTRAST TECHNIQUE: Multidetector CT imaging of the head, cervical spine, and maxillofacial structures were performed using the standard protocol without intravenous contrast. Multiplanar CT image reconstructions of the cervical spine and maxillofacial structures were also generated. COMPARISON:  03/03/2013 brain and cervical spine MR. 12/08/2004 head CT. FINDINGS: CT HEAD FINDINGS Brain: No evidence of acute infarction, hemorrhage, hydrocephalus, extra-axial collection or mass lesion/mass effect. Mild atrophy and mild chronic small-vessel white matter ischemic changes  noted. Vascular: Intracranial atherosclerotic calcifications noted. Skull: Normal. Negative for fracture or focal lesion. Other: Left forehead soft tissue swelling noted. CT MAXILLOFACIAL FINDINGS Osseous: No fracture or mandibular dislocation. No destructive process. Orbits: Negative. No traumatic or inflammatory finding. Sinuses: Clear. Soft tissues: Left forehead and facial soft tissue swelling noted. CT CERVICAL SPINE FINDINGS Alignment: Normal. Skull base and vertebrae: No acute fracture. No primary bone lesion or focal pathologic process. Soft tissues and spinal canal: No prevertebral fluid or swelling. No visible canal hematoma. Disc levels: Multilevel degenerative disc disease/ spondylosis noted, mild to moderate from C4-C7. Upper chest: No acute abnormality Other: None IMPRESSION: 1. No evidence of acute intracranial abnormality. Atrophy and chronic small-vessel white matter ischemic changes. 2. Left facial and forehead soft tissue swelling without fracture. 3. No static evidence of acute injury to the cervical spine. Mild to moderate degenerative disc disease/spondylosis from C4-C7. Electronically Signed   By: Margarette Canada M.D.   On: 02/22/2017 14:53   Dg Chest Port 1 View  Result Date: 02/22/2017 CLINICAL DATA:  Syncope.  Fall EXAM: PORTABLE CHEST 1 VIEW COMPARISON:  06/25/2015 FINDINGS: Heart size upper normal. Normal pulmonary vascularity. Lungs are clear without infiltrate or effusion. IMPRESSION: No active disease. Electronically Signed   By: Franchot Gallo M.D.   On: 02/22/2017 13:26   Dg Hand Complete Left  Result Date: 02/22/2017 CLINICAL DATA:  Syncope.  Fall.  Hand pain EXAM: LEFT HAND - COMPLETE 3+ VIEW COMPARISON:  None. FINDINGS: Advanced degenerative change at the base of thumb with joint space narrowing and spurring. Extension deformity of the first MCP joint. Osteoarthritis in the second and third DIP joints. Early chondrocalcinosis in the triangular fibrocartilage. Negative for  fracture IMPRESSION: Negative for fracture. Electronically Signed   By: Franchot Gallo M.D.   On: 02/22/2017 13:27   Ct Maxillofacial Wo Contrast  Result Date: 02/22/2017 CLINICAL DATA:  76 year old female with syncope and fall this morning with head, face and neck injury. Left facial pain and headache. EXAM: CT HEAD WITHOUT CONTRAST CT MAXILLOFACIAL WITHOUT CONTRAST CT CERVICAL SPINE WITHOUT CONTRAST TECHNIQUE: Multidetector CT imaging of the head, cervical spine, and maxillofacial structures were performed using the standard protocol without intravenous contrast. Multiplanar CT image reconstructions of the cervical spine and maxillofacial structures were also generated. COMPARISON:  03/03/2013 brain and cervical spine MR. 12/08/2004 head CT. FINDINGS: CT HEAD FINDINGS Brain: No evidence of acute infarction, hemorrhage, hydrocephalus, extra-axial collection or mass lesion/mass effect. Mild atrophy and mild chronic small-vessel white matter ischemic changes noted. Vascular: Intracranial atherosclerotic calcifications noted. Skull: Normal. Negative for fracture or focal lesion. Other: Left forehead soft tissue swelling noted. CT MAXILLOFACIAL FINDINGS Osseous: No fracture or mandibular dislocation. No destructive process. Orbits: Negative. No traumatic or inflammatory finding. Sinuses: Clear. Soft tissues: Left forehead and facial soft  tissue swelling noted. CT CERVICAL SPINE FINDINGS Alignment: Normal. Skull base and vertebrae: No acute fracture. No primary bone lesion or focal pathologic process. Soft tissues and spinal canal: No prevertebral fluid or swelling. No visible canal hematoma. Disc levels: Multilevel degenerative disc disease/ spondylosis noted, mild to moderate from C4-C7. Upper chest: No acute abnormality Other: None IMPRESSION: 1. No evidence of acute intracranial abnormality. Atrophy and chronic small-vessel white matter ischemic changes. 2. Left facial and forehead soft tissue swelling without  fracture. 3. No static evidence of acute injury to the cervical spine. Mild to moderate degenerative disc disease/spondylosis from C4-C7. Electronically Signed   By: Margarette Canada M.D.   On: 02/22/2017 14:53    Scheduled Meds: . amLODipine  5 mg Oral BID  . aspirin EC  81 mg Oral Daily  . calcitRIOL  0.25 mcg Oral Daily  . clopidogrel  75 mg Oral Daily  . diphenhydrAMINE  25 mg Intravenous On Call to OR  . gabapentin  100 mg Oral Daily  . gentamicin irrigation  80 mg Irrigation On Call  . levothyroxine  88 mcg Oral QAC breakfast  . methylPREDNISolone (SOLU-MEDROL) injection  125 mg Intravenous On Call to OR  . rosuvastatin  5 mg Oral Daily  . sodium chloride flush  3 mL Intravenous Q12H    Continuous Infusions: . sodium chloride Stopped (02/23/17 0549)  . sodium chloride 50 mL/hr at 02/23/17 0600  . sodium chloride    .  ceFAZolin (ANCEF) IV    . famotidine (PEPCID) IV       LOS: 0 days    Assessment/Plan: Active Problems:   Hypothyroidism   Pure hypercholesterolemia   GERD   Diverticulosis of colon   Osteoarthritis of spine   Palpitations   Avitaminosis D   Hyperparathyroidism due to renal insufficiency (HCC)   Arthritis of knee, degenerative   Cerebrovascular accident (CVA) ([place])   CKD (chronic kidney disease) stage 4, GFR 15-29 ml/min (HCC)   Anemia, iron deficiency   Carotid artery disease (HCC)   Syncope  Syncope, most likely cardiac in nature 2/2 to arrhythmia -Cardiology following and plans for pacemaker placement -Telemetry monitoring -EF 60-65% echo 02/23/17  AKI on CKD IV -cr 3.83 baseline 3.2 -avoid nephrotoxic agents -repeat BMP in the am and monitor urine output -Gentle IV fluid hydration NS 75 cc/hr  Hypothyroidism -levothyroxine  HTN -stable -Amlodipine  HLD -crestor  Hx of CVA -No residual deficits -c/w ASA, plavix, crestor  Left knee OA -No acute issue -uses a cane prn  Code Status: Full  Family Communication: With husband at  bedside with the patient. All questions answered to their satisfaction  Disposition Plan: Will stay another midnight due to possible pacemaker placement.   Consultants:  Cardiology  Procedures:  Possible pacemaker placement  Antimicrobials:  Not indicated  DVT prophylaxis: Heparin for pacemaker placement       Kayleen Memos, MD [facility] Pager [phone]  If 7PM-7AM, please contact night-coverage [email or web address] Password [email or web address] 02/23/2017, 10:14 AM

## 2017-02-23 NOTE — Progress Notes (Signed)
Progress Note  Patient Name: Katrina Yang Date of Encounter: 02/23/2017  Primary Cardiologist: Dr. Burt Knack  Subjective   Feels well.  No recurrent symptoms/syncope.  No complaints of any kind  Inpatient Medications    Scheduled Meds: . amLODipine  5 mg Oral BID  . aspirin EC  81 mg Oral Daily  . calcitRIOL  0.25 mcg Oral Daily  . clopidogrel  75 mg Oral Daily  . gabapentin  100 mg Oral Daily  . gentamicin irrigation  80 mg Irrigation On Call  . levothyroxine  88 mcg Oral QAC breakfast  . metoprolol tartrate  25 mg Oral BID  . rosuvastatin  5 mg Oral Daily  . sodium chloride flush  3 mL Intravenous Q12H   Continuous Infusions: . sodium chloride Stopped (02/23/17 0549)  . sodium chloride 50 mL/hr at 02/23/17 0600  . sodium chloride    .  ceFAZolin (ANCEF) IV     PRN Meds: acetaminophen **OR** acetaminophen, bisacodyl, morphine injection, ondansetron **OR** ondansetron (ZOFRAN) IV, senna-docusate   Vital Signs    Vitals:   02/22/17 1829 02/22/17 1925 02/23/17 0039 02/23/17 0539  BP: (!) 159/77 (!) 145/67 132/61 (!) 144/69  Pulse: 78 80 68 83  Resp:  18 18 18   Temp: 98.2 F (36.8 C) 98.3 F (36.8 C) 98.7 F (37.1 C) 98.7 F (37.1 C)  TempSrc: Oral Oral Oral Oral  SpO2: 98% 96% 94% 97%  Weight: 115 lb 14.4 oz (52.6 kg)   116 lb 3.2 oz (52.7 kg)  Height: 5\' 3"  (1.6 m)       Intake/Output Summary (Last 24 hours) at 02/23/17 0730 Last data filed at 02/23/17 0651  Gross per 24 hour  Intake           661.25 ml  Output              500 ml  Net           161.25 ml   Filed Weights   02/22/17 1829 02/23/17 0539  Weight: 115 lb 14.4 oz (52.6 kg) 116 lb 3.2 oz (52.7 kg)    Telemetry    SR 70's-80's - Personally Reviewed  ECG     Personally Reviewed  Admit: SR, 72bpm, RBBB, LPFB 11/27/16: SR, 63bpm, RBBB 11/05/15: SR, 65bpm, normal intervals   Physical Exam   GEN: No acute distress.  Ecchymosis L periorbital region, missing front teeth (4) Neck: No  JVD Cardiac: RRR, no murmurs, rubs, or gallops.  Respiratory: Clear to auscultation bilaterally. GI: Soft, nontender, non-distended  MS: No edema; No deformity. Neuro:  Nonfocal  Psych: Normal affect   Labs    Chemistry Recent Labs Lab 02/22/17 1240  NA 138  K 4.4  CL 109  CO2 20*  GLUCOSE 99  BUN 45*  CREATININE 3.88*  CALCIUM 9.4  GFRNONAA 10*  GFRAA 12*  ANIONGAP 9     Hematology Recent Labs Lab 02/22/17 1240  WBC 8.3  RBC 3.74*  HGB 11.1*  HCT 33.7*  MCV 90.1  MCH 29.7  MCHC 32.9  RDW 14.3  PLT 292    Cardiac Enzymes Recent Labs Lab 02/22/17 1622 02/22/17 1851 02/22/17 2122  TROPONINI <0.03 <0.03 <0.03    Recent Labs Lab 02/22/17 1259  TROPIPOC 0.00     BNPNo results for input(s): BNP, PROBNP in the last 168 hours.   DDimer No results for input(s): DDIMER in the last 168 hours.   Radiology    Dg Chest [place] 1  View Result Date: 02/22/2017 CLINICAL DATA:  Syncope.  Fall EXAM: PORTABLE CHEST 1 VIEW COMPARISON:  06/25/2015 FINDINGS: Heart size upper normal. Normal pulmonary vascularity. Lungs are clear without infiltrate or effusion. IMPRESSION: No active disease. Electronically Signed   By: Franchot Gallo M.D.   On: 02/22/2017 13:26      Cardiac Studies   12/08/16: TTE Study Conclusions - Left ventricle: The cavity size was normal. Wall thickness was   increased in a pattern of mild LVH. Systolic function was normal.   The estimated ejection fraction was in the range of 55% to 60%.   Doppler parameters are consistent with abnormal left ventricular   relaxation (grade 1 diastolic dysfunction). The E/e&' ratio is   between 8-15, suggesting indeterminate LV filling pressure. - Left atrium: The atrium was normal in size. - Tricuspid valve: There was trivial regurgitation. - Inferior vena cava: The vessel was normal in size. The   respirophasic diameter changes were in the normal range (= 50%),   consistent with normal central venous  pressure. Impressions: - Compared to a prior study in 2015, there are few changes. LVEF   55-60%. There is now mild LVH.  Patient Profile     76 y.o. female with PMHx of CVA (on ASA/plavix), CRI (stage IV), HTN, HLD admitted after a syncopal event.  Assessment & Plan    1. Syncope w/facial trauma (no fractures)     Seen in consult yesterday by Dr. Caryl Comes, strongly suspects arrhythmic syncope, and given baseline conduction system disease recommended PPM implant      On Lopressor 25mg  BID at home, last dose would have been about 0800-0900 yesterday AM, has not gotten any here (1/2 life up to 4 hours)  Discussed with the patient theory and rational, discussed other potential caurses of syncope such as hypotension as well.  Discussed pacemaker implant procedure, risks, benefits and she would like to proceed.  She reports anaphylaxis with contrast and given her stage IV renal disease will try to avoid contrast, though will pre-medicate for allergy.  Dr. Lovena Le to see the patient        2. HTN     Continue to hold lopressor for now   3. Stage IV CKD     Per patient has been told she has been holding steady, no plans for HD currently     (in review of last note, suspected dialysis was in the "not too distant future", thoughts were that she may be a good PD candidate if/when she reached ESRD           For questions or updates, please contact [place] Please consult [email or web address] for contact info under Cardiology/STEMI.      Signed, Baldwin Jamaica, PA-C  02/23/2017, 7:30 AM    EP attending  Patient seen and examined. Agree with the findings as noted above. I've discussed the indications for permanent pacemaker insertion with the patient. She has progressive conduction system disease, bifascicular block, and unexplained syncope. Previously, she experienced an episode of cryptogenic stroke and refused a loop recorder. Permanent pacemaker insertion is indicated. I discussed the  potential limitations of the procedure. 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 I've also warned the patient that there are other possible causes of syncope though her current symptom complex suggest a Stokes-Adams attack.  Cristopher Peru, M.D.

## 2017-02-23 NOTE — H&P (View-Only) (Signed)
Progress Note  Patient Name: Katrina Yang Date of Encounter: 02/23/2017  Primary Cardiologist: Dr. Burt Knack  Subjective   Feels well.  No recurrent symptoms/syncope.  No complaints of any kind  Inpatient Medications    Scheduled Meds: . amLODipine  5 mg Oral BID  . aspirin EC  81 mg Oral Daily  . calcitRIOL  0.25 mcg Oral Daily  . clopidogrel  75 mg Oral Daily  . gabapentin  100 mg Oral Daily  . gentamicin irrigation  80 mg Irrigation On Call  . levothyroxine  88 mcg Oral QAC breakfast  . metoprolol tartrate  25 mg Oral BID  . rosuvastatin  5 mg Oral Daily  . sodium chloride flush  3 mL Intravenous Q12H   Continuous Infusions: . sodium chloride Stopped (02/23/17 0549)  . sodium chloride 50 mL/hr at 02/23/17 0600  . sodium chloride    .  ceFAZolin (ANCEF) IV     PRN Meds: acetaminophen **OR** acetaminophen, bisacodyl, morphine injection, ondansetron **OR** ondansetron (ZOFRAN) IV, senna-docusate   Vital Signs    Vitals:   02/22/17 1829 02/22/17 1925 02/23/17 0039 02/23/17 0539  BP: (!) 159/77 (!) 145/67 132/61 (!) 144/69  Pulse: 78 80 68 83  Resp:  18 18 18   Temp: 98.2 F (36.8 C) 98.3 F (36.8 C) 98.7 F (37.1 C) 98.7 F (37.1 C)  TempSrc: Oral Oral Oral Oral  SpO2: 98% 96% 94% 97%  Weight: 115 lb 14.4 oz (52.6 kg)   116 lb 3.2 oz (52.7 kg)  Height: 5\' 3"  (1.6 m)       Intake/Output Summary (Last 24 hours) at 02/23/17 0730 Last data filed at 02/23/17 0651  Gross per 24 hour  Intake           661.25 ml  Output              500 ml  Net           161.25 ml   Filed Weights   02/22/17 1829 02/23/17 0539  Weight: 115 lb 14.4 oz (52.6 kg) 116 lb 3.2 oz (52.7 kg)    Telemetry    SR 70's-80's - Personally Reviewed  ECG     Personally Reviewed  Admit: SR, 72bpm, RBBB, LPFB 11/27/16: SR, 63bpm, RBBB 11/05/15: SR, 65bpm, normal intervals   Physical Exam   GEN: No acute distress.  Ecchymosis L periorbital region, missing front teeth (4) Neck: No  JVD Cardiac: RRR, no murmurs, rubs, or gallops.  Respiratory: Clear to auscultation bilaterally. GI: Soft, nontender, non-distended  MS: No edema; No deformity. Neuro:  Nonfocal  Psych: Normal affect   Labs    Chemistry Recent Labs Lab 02/22/17 1240  NA 138  K 4.4  CL 109  CO2 20*  GLUCOSE 99  BUN 45*  CREATININE 3.88*  CALCIUM 9.4  GFRNONAA 10*  GFRAA 12*  ANIONGAP 9     Hematology Recent Labs Lab 02/22/17 1240  WBC 8.3  RBC 3.74*  HGB 11.1*  HCT 33.7*  MCV 90.1  MCH 29.7  MCHC 32.9  RDW 14.3  PLT 292    Cardiac Enzymes Recent Labs Lab 02/22/17 1622 02/22/17 1851 02/22/17 2122  TROPONINI <0.03 <0.03 <0.03    Recent Labs Lab 02/22/17 1259  TROPIPOC 0.00     BNPNo results for input(s): BNP, PROBNP in the last 168 hours.   DDimer No results for input(s): DDIMER in the last 168 hours.   Radiology    Dg Chest [place] 1  View Result Date: 02/22/2017 CLINICAL DATA:  Syncope.  Fall EXAM: PORTABLE CHEST 1 VIEW COMPARISON:  06/25/2015 FINDINGS: Heart size upper normal. Normal pulmonary vascularity. Lungs are clear without infiltrate or effusion. IMPRESSION: No active disease. Electronically Signed   By: Franchot Gallo M.D.   On: 02/22/2017 13:26      Cardiac Studies   12/08/16: TTE Study Conclusions - Left ventricle: The cavity size was normal. Wall thickness was   increased in a pattern of mild LVH. Systolic function was normal.   The estimated ejection fraction was in the range of 55% to 60%.   Doppler parameters are consistent with abnormal left ventricular   relaxation (grade 1 diastolic dysfunction). The E/e&' ratio is   between 8-15, suggesting indeterminate LV filling pressure. - Left atrium: The atrium was normal in size. - Tricuspid valve: There was trivial regurgitation. - Inferior vena cava: The vessel was normal in size. The   respirophasic diameter changes were in the normal range (= 50%),   consistent with normal central venous  pressure. Impressions: - Compared to a prior study in 2015, there are few changes. LVEF   55-60%. There is now mild LVH.  Patient Profile     76 y.o. female with PMHx of CVA (on ASA/plavix), CRI (stage IV), HTN, HLD admitted after a syncopal event.  Assessment & Plan    1. Syncope w/facial trauma (no fractures)     Seen in consult yesterday by Dr. Caryl Comes, strongly suspects arrhythmic syncope, and given baseline conduction system disease recommended PPM implant      On Lopressor 25mg  BID at home, last dose would have been about 0800-0900 yesterday AM, has not gotten any here (1/2 life up to 4 hours)  Discussed with the patient theory and rational, discussed other potential caurses of syncope such as hypotension as well.  Discussed pacemaker implant procedure, risks, benefits and she would like to proceed.  She reports anaphylaxis with contrast and given her stage IV renal disease will try to avoid contrast, though will pre-medicate for allergy.  Dr. Lovena Le to see the patient        2. HTN     Continue to hold lopressor for now   3. Stage IV CKD     Per patient has been told she has been holding steady, no plans for HD currently     (in review of last note, suspected dialysis was in the "not too distant future", thoughts were that she may be a good PD candidate if/when she reached ESRD           For questions or updates, please contact [place] Please consult [email or web address] for contact info under Cardiology/STEMI.      Signed, Baldwin Jamaica, PA-C  02/23/2017, 7:30 AM    EP attending  Patient seen and examined. Agree with the findings as noted above. I've discussed the indications for permanent pacemaker insertion with the patient. She has progressive conduction system disease, bifascicular block, and unexplained syncope. Previously, she experienced an episode of cryptogenic stroke and refused a loop recorder. Permanent pacemaker insertion is indicated. I discussed the  potential limitations of the procedure. 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 I've also warned the patient that there are other possible causes of syncope though her current symptom complex suggest a Stokes-Adams attack.  Cristopher Peru, M.D.

## 2017-02-24 ENCOUNTER — Inpatient Hospital Stay (HOSPITAL_COMMUNITY): Payer: Medicare Other

## 2017-02-24 ENCOUNTER — Encounter (HOSPITAL_COMMUNITY): Payer: Self-pay | Admitting: Internal Medicine

## 2017-02-24 DIAGNOSIS — I779 Disorder of arteries and arterioles, unspecified: Secondary | ICD-10-CM

## 2017-02-24 DIAGNOSIS — Z950 Presence of cardiac pacemaker: Secondary | ICD-10-CM | POA: Diagnosis present

## 2017-02-24 LAB — CBC
HEMATOCRIT: 32.3 % — AB (ref 36.0–46.0)
Hemoglobin: 10.3 g/dL — ABNORMAL LOW (ref 12.0–15.0)
MCH: 28.7 pg (ref 26.0–34.0)
MCHC: 31.9 g/dL (ref 30.0–36.0)
MCV: 90 fL (ref 78.0–100.0)
PLATELETS: 294 10*3/uL (ref 150–400)
RBC: 3.59 MIL/uL — ABNORMAL LOW (ref 3.87–5.11)
RDW: 13.9 % (ref 11.5–15.5)
WBC: 11 10*3/uL — AB (ref 4.0–10.5)

## 2017-02-24 LAB — COMPREHENSIVE METABOLIC PANEL
ALBUMIN: 3.5 g/dL (ref 3.5–5.0)
ALT: 10 U/L — AB (ref 14–54)
AST: 23 U/L (ref 15–41)
Alkaline Phosphatase: 54 U/L (ref 38–126)
Anion gap: 11 (ref 5–15)
BUN: 38 mg/dL — AB (ref 6–20)
CHLORIDE: 114 mmol/L — AB (ref 101–111)
CO2: 16 mmol/L — ABNORMAL LOW (ref 22–32)
CREATININE: 3.22 mg/dL — AB (ref 0.44–1.00)
Calcium: 9.2 mg/dL (ref 8.9–10.3)
GFR calc Af Amer: 15 mL/min — ABNORMAL LOW (ref >60)
GFR, EST NON AFRICAN AMERICAN: 13 mL/min — AB (ref >60)
GLUCOSE: 156 mg/dL — AB (ref 65–99)
POTASSIUM: 4.7 mmol/L (ref 3.5–5.1)
Sodium: 141 mmol/L (ref 135–145)
Total Bilirubin: 0.5 mg/dL (ref 0.3–1.2)
Total Protein: 6.6 g/dL (ref 6.5–8.1)

## 2017-02-24 LAB — T4, FREE: Free T4: 1.22 ng/dL — ABNORMAL HIGH (ref 0.61–1.12)

## 2017-02-24 LAB — GLUCOSE, CAPILLARY: Glucose-Capillary: 129 mg/dL — ABNORMAL HIGH (ref 65–99)

## 2017-02-24 LAB — TSH: TSH: 0.112 u[IU]/mL — AB (ref 0.350–4.500)

## 2017-02-24 MED ORDER — CEFAZOLIN SODIUM-DEXTROSE 2-4 GM/100ML-% IV SOLN
2.0000 g | Freq: Once | INTRAVENOUS | Status: AC
  Administered 2017-02-24: 2 g via INTRAVENOUS
  Filled 2017-02-24 (×2): qty 100

## 2017-02-24 NOTE — Discharge Instructions (Signed)
Cardiac Event Monitoring A cardiac event monitor is a small recording device that is used to detect abnormal heart rhythms (arrhythmias). The monitor is used to record your heart rhythm when you have symptoms, such as:  Fast heartbeats (palpitations), such as heart racing or fluttering.  Dizziness.  Fainting or light-headedness.  Unexplained weakness.  Some monitors are wired to electrodes placed on your chest. Electrodes are flat, sticky disks that attach to your skin. Other monitors may be hand-held or worn on the wrist. The monitor can be worn for up to 30 days. If the monitor is attached to your chest, a technician will prepare your chest for the electrode placement and show you how to work the monitor. Take time to practice using the monitor before you leave the office. Make sure you understand how to send the information from the monitor to your health care provider. In some cases, you may need to use a landline telephone instead of a cell phone. What are the risks? Generally, this device is safe to use, but it possible that the skin under the electrodes will become irritated. How to use your cardiac event monitor  Wear your monitor at all times, except when you are in water: ? Do not let the monitor get wet. ? Take the monitor off when you bathe. Do not swim or use a hot tub with it on.  Keep your skin clean. Do not put body lotion or moisturizer on your chest.  Change the electrodes as told by your health care provider or any time they stop sticking to your skin. You may need to use medical tape to keep them on.  Try to put the electrodes in slightly different places on your chest to help prevent skin irritation. They must remain in the area under your left breast and in the upper right section of your chest.  Make sure the monitor is safely clipped to your clothing or in a location close to your body that your health care provider recommends.  Press the button to record as  soon as you feel heart-related symptoms, such as: ? Dizziness. ? Weakness. ? Light-headedness. ? Palpitations. ? Thumping or pounding in your chest. ? Shortness of breath. ? Unexplained weakness.  Keep a diary of your activities, such as walking, doing chores, and taking medicine. It is very important to note what you were doing when you pushed the button to record your symptoms. This will help your health care provider determine what might be contributing to your symptoms.  Send the recorded information as recommended by your health care provider. It may take some time for your health care provider to process the results.  Change the batteries as told by your health care provider.  Keep electronic devices away from your monitor. This includes: ? Tablets. ? MP3 players. ? Cell phones.  While wearing your monitor you should avoid: ? Electric blankets. ? Armed forces operational officer. ? Electric toothbrushes. ? Microwave ovens. ? Magnets. ? Metal detectors. Get help right away if:  You have chest pain.  You have extreme difficulty breathing or shortness of breath.  You develop a very fast heartbeat that persists.  You develop dizziness that does not go away.  You faint or constantly feel like you are about to faint. Summary  A cardiac event monitor is a small recording device that is used to help detect abnormal heart rhythms (arrhythmias).  The monitor is used to record your heart rhythm when you have heart-related symptoms.  Make sure you understand how to send the information from the monitor to your health care provider.  It is important to press the button on the monitor when you have any heart-related symptoms.  Keep a diary of your activities, such as walking, doing chores, and taking medicine. It is very important to note what you were doing when you pushed the button to record your symptoms. This will help your health care provider learn what might be causing your  symptoms. This information is not intended to replace advice given to you by your health care provider. Make sure you discuss any questions you have with your health care provider. Document Released: 01/29/2008 Document Revised: 04/05/2016 Document Reviewed: 04/05/2016 Elsevier Interactive Patient Education  2017 [employer].   Electrophysiology Study An electrophysiology (EP) study is a heart test in which thin, flexible tubes (catheters) are placed in a large vein in your groin, arm, neck, or chest. This test is done to evaluate the electrical conduction system of your heart. You may need this test if you have:  Dizziness or fainting.  A fast heartbeat (tachycardia).  A slow heartbeat (bradycardia).  An irregular heartbeat (arrhythmia), such as atrial fibrillation.  Tell a health care provider about:  Any allergies you have.  All medicines you are taking, including vitamins, herbs, eye drops, creams, and over-the-counter medicines.  Any problems you or family members have had with anesthetic medicines.  Any blood disorders you have.  Any surgeries you have had.  Any medical conditions you have.  Whether you are pregnant or may be pregnant. What are the risks? Generally, this is a safe procedure. However, problems may occur, including:  Tachycardia that does not go away.  Bleeding or bruising around the insertion sites.  Infection.  Temporary or permanent heart rhythm abnormalities.  Temporary changes in blood pressure.  Puncture (perforation) of the heart wall or a blood vessel. This can cause bleeding between the heart and the sac that surrounds it (cardiac tamponade).  Possible cardiac arrest or fatal heart arrhythmia.  Allergic reactions to medicines or dyes.  Damage to other structures or organs.  What happens before the procedure? Staying hydrated Follow instructions from your health care provider about hydration, which may include:  Up to 2 hours  before the procedure - you may continue to drink clear liquids, such as water, clear fruit juice, black coffee, and plain tea.  Eating and drinking restrictions Follow instructions from your health care provider about eating and drinking, which may include:  8 hours before the procedure - stop eating heavy meals or foods such as meat, fried foods, or fatty foods.  6 hours before the procedure - stop eating light meals or foods, such as toast or cereal.  6 hours before the procedure - stop drinking milk or drinks that contain milk.  2 hours before the procedure - stop drinking clear liquids.  Medicines  Ask your health care provider about: ? Changing or stopping your regular medicines. This is especially important if you are taking diabetes medicines or blood thinners. ? Taking medicines such as aspirin and ibuprofen. These medicines can thin your blood. Do not take these medicines before your procedure if your health care provider instructs you not to.  You may be given antibiotic medicine to help prevent infection. General instructions  Plan to have someone take you home from the hospital or clinic.  If you will be going home right after the procedure, plan to have someone with you for 24  hours.  Ask your health care provider how your surgical site will be marked or identified. What happens during the procedure?  To lower your risk of infection: ? Your health care team will wash or sanitize their hands. ? Your skin will be washed with soap. ? Hair may be removed from the surgical area.  An IV tube will be inserted into one of your veins.  You will be given one or more of the following: ? A medicine to help you relax (sedative). ? A medicine to numb the area (local anesthetic). ? A medicine to make you fall asleep (general anesthetic).  Catheters with an electrode tip will be inserted into a large vein. These electrode tips can measure the heart's electrical activity. They  can also use electrical signals to change the heart rhythm.  The catheters will be guided to the heart using a type of X-ray machine (fluoroscopy). Once the catheters are in the heart, they will evaluate the electrical activity of your heart.  If you are awake during the EP study, you may feel dizzy or light-headed. Your heart rate may temporarily increase or you may feel your heart beating hard. Tell your health care provider if you experience these things during the EP study: ? You feel dizzy or nauseous. ? You have chest pain or pressure.  The catheters will be removed.  Firm pressure will be applied to the insertion sites to prevent bleeding.  A bandage (dressing) may be applied over the insertion sites. The procedure may vary among health care providers and hospitals. What happens after the procedure?  Do not drive for 24 hours if you were given a sedative.  Your blood pressure, heart rate, breathing rate, and blood oxygen level will be monitored until the medicines you were given have worn off.  You will need to lie flat for a few hours or as told by your health care provider. Keep your legs straight. Do not bend or cross your legs. This information is not intended to replace advice given to you by your health care provider. Make sure you discuss any questions you have with your health care provider. Document Released: 10/09/2009 Document Revised: 11/23/2015 Document Reviewed: 10/29/2015 Elsevier Interactive Patient Education  2018 [place] A contusion is a deep bruise. Contusions happen when an injury causes bleeding under the skin. Symptoms of bruising include pain, swelling, and discolored skin. The skin may turn blue, purple, or yellow. Follow these instructions at home:  Rest the injured area.  If told, put ice on the injured area. ? Put ice in a plastic bag. ? Place a towel between your skin and the bag. ? Leave the ice on for 20 minutes, 2-3 times per  day.  If told, put light pressure (compression) on the injured area using an elastic bandage. Make sure the bandage is not too tight. Remove it and put it back on as told by your doctor.  If possible, raise (elevate) the injured area above the level of your heart while you are sitting or lying down.  Take over-the-counter and prescription medicines only as told by your doctor. Contact a doctor if:  Your symptoms do not get better after several days of treatment.  Your symptoms get worse.  You have trouble moving the injured area. Get help right away if:  You have very bad pain.  You have a loss of feeling (numbness) in a hand or foot.  Your hand or foot turns pale or  cold. This information is not intended to replace advice given to you by your health care provider. Make sure you discuss any questions you have with your health care provider. Document Released: 10/08/2007 Document Revised: 09/27/2015 Document Reviewed: 09/06/2014 Elsevier Interactive Patient Education  2018 [place] Discharge Instructions for  Pacemaker/Defibrillator Patients  Activity No heavy lifting or vigorous activity with your left/right arm for 6 to 8 weeks.  Do not raise your left/right arm above your head for one week.  Gradually raise your affected arm as drawn below.               02/28/17                03/01/17                  03/02/17                  03/03/17  NO DRIVING until cleared to at your wound check visit .  WOUND CARE - Keep the wound area clean and dry.  Do not get this area wet for one week. No showers for one week; you may shower on 03/03/17  . - The tape/steri-strips on your wound will fall off; do not pull them off.  No bandage is needed on the site.  DO  NOT apply any creams, oils, or ointments to the wound area. - If you notice any drainage or discharge from the wound, any swelling or bruising at the site, or you develop a fever > 101? F after you are  discharged home, call the office at once.  Special Instructions - You are still able to use cellular telephones; use the ear opposite the side where you have your pacemaker/defibrillator.  Avoid carrying your cellular phone near your device. - When traveling through airports, show security personnel your identification card to avoid being screened in the metal detectors.  Ask the security personnel to use the hand wand. - Avoid arc welding equipment, MRI testing (magnetic resonance imaging), TENS units (transcutaneous nerve stimulators).  Call the office for questions about other devices. - Avoid electrical appliances that are in poor condition or are not properly grounded. - Microwave ovens are safe to be near or to operate.

## 2017-02-24 NOTE — Progress Notes (Signed)
Progress Note  Patient Name: Katrina Yang Date of Encounter: 02/24/2017  Primary Cardiologist: Dr. Burt Knack  Subjective   Feels well,denies any symptoms this morning, reports mild 1/10 implant site discomfort, no CP or SOB.  Inpatient Medications    Scheduled Meds: . amLODipine  5 mg Oral BID  . aspirin EC  81 mg Oral Daily  . calcitRIOL  0.25 mcg Oral Daily  . clopidogrel  75 mg Oral Daily  . gabapentin  100 mg Oral Daily  . levothyroxine  88 mcg Oral QAC breakfast  . rosuvastatin  5 mg Oral Daily  . sodium chloride flush  3 mL Intravenous Q12H   Continuous Infusions: . sodium chloride 75 mL/hr at 02/24/17 0307  .  ceFAZolin (ANCEF) IV 2 g (02/24/17 0926)   PRN Meds: acetaminophen **OR** acetaminophen, bisacodyl, morphine injection, ondansetron **OR** ondansetron (ZOFRAN) IV, senna-docusate   Vital Signs    Vitals:   02/23/17 2004 02/24/17 0053 02/24/17 0440 02/24/17 0744  BP: (!) 145/67 130/78 139/69 104/74  Pulse: 89 76 77 80  Resp: 18 18 18 18   Temp: 97.8 F (36.6 C) 98.2 F (36.8 C) 98.5 F (36.9 C) 97.9 F (36.6 C)  TempSrc: Oral Oral Oral Oral  SpO2: 97% 98% 97% 100%  Weight:   117 lb 12.8 oz (53.4 kg)   Height:        Intake/Output Summary (Last 24 hours) at 02/24/17 0942 Last data filed at 02/24/17 0839  Gross per 24 hour  Intake              720 ml  Output              700 ml  Net               20 ml   Filed Weights   02/22/17 1829 02/23/17 0539 02/24/17 0440  Weight: 115 lb 14.4 oz (52.6 kg) 116 lb 3.2 oz (52.7 kg) 117 lb 12.8 oz (53.4 kg)    Telemetry    SR 70's-80's - Personally Reviewed  ECG     Personally Reviewed  Admit: SR, 72bpm, RBBB, LPFB 11/27/16: SR, 63bpm, RBBB 11/05/15: SR, 65bpm, normal intervals   Physical Exam   GEN: No acute distress.  Ecchymosis L periorbital region, missing front teeth (4) Neck: No JVD Cardiac: RRR, no murmurs, rubs, or gallops.  Respiratory: Clear to auscultation bilaterally. GI: Soft,  nontender, non-distended  MS: No edema; No deformity. Neuro:  Nonfocal  Psych: Normal affect   Pacer site is stable, no bleeding or hematoma, minimal amount of ecchymosis  Labs    Chemistry  Recent Labs Lab 02/22/17 1240 02/23/17 0627 02/24/17 0524  NA 138 139 141  K 4.4 3.9 4.7  CL 109 112* 114*  CO2 20* 21* 16*  GLUCOSE 99 94 156*  BUN 45* 37* 38*  CREATININE 3.88* 3.45* 3.22*  CALCIUM 9.4 9.0 9.2  PROT  --  6.2* 6.6  ALBUMIN  --  3.2* 3.5  AST  --  22 23  ALT  --  13* 10*  ALKPHOS  --  56 54  BILITOT  --  0.6 0.5  GFRNONAA 10* 12* 13*  GFRAA 12* 14* 15*  ANIONGAP 9 6 11      Hematology  Recent Labs Lab 02/22/17 1240 02/23/17 0627 02/24/17 0524  WBC 8.3 7.3 11.0*  RBC 3.74* 3.30* 3.59*  HGB 11.1* 9.4* 10.3*  HCT 33.7* 30.0* 32.3*  MCV 90.1 90.9 90.0  MCH 29.7 28.5  28.7  MCHC 32.9 31.3 31.9  RDW 14.3 14.2 13.9  PLT 292 277 294    Cardiac Enzymes  Recent Labs Lab 02/22/17 1622 02/22/17 1851 02/22/17 2122  TROPONINI <0.03 <0.03 <0.03     Recent Labs Lab 02/22/17 1259  TROPIPOC 0.00     BNPNo results for input(s): BNP, PROBNP in the last 168 hours.   DDimer No results for input(s): DDIMER in the last 168 hours.   Radiology    Dg Chest Port 1 View Result Date: 02/22/2017 CLINICAL DATA:  Syncope.  Fall EXAM: PORTABLE CHEST 1 VIEW COMPARISON:  06/25/2015 FINDINGS: Heart size upper normal. Normal pulmonary vascularity. Lungs are clear without infiltrate or effusion. IMPRESSION: No active disease. Electronically Signed   By: Franchot Gallo M.D.   On: 02/22/2017 13:26      Cardiac Studies   12/08/16: TTE Study Conclusions - Left ventricle: The cavity size was normal. Wall thickness was   increased in a pattern of mild LVH. Systolic function was normal.   The estimated ejection fraction was in the range of 55% to 60%.   Doppler parameters are consistent with abnormal left ventricular   relaxation (grade 1 diastolic dysfunction). The E/e&'  ratio is   between 8-15, suggesting indeterminate LV filling pressure. - Left atrium: The atrium was normal in size. - Tricuspid valve: There was trivial regurgitation. - Inferior vena cava: The vessel was normal in size. The   respirophasic diameter changes were in the normal range (= 50%),   consistent with normal central venous pressure. Impressions: - Compared to a prior study in 2015, there are few changes. LVEF   55-60%. There is now mild LVH.  Patient Profile     76 y.o. female with PMHx of CVA (on ASA/plavix), CRI (stage IV), HTN, HLD admitted after a syncopal event.  Assessment & Plan    1. Syncope w/facial trauma (no fractures)     Seen in consult initially by Dr. Caryl Comes, strongly suspected to have arrhythmic/brady syncope, and given baseline conduction system disease recommended PPM implant  She is now s/p PPM implant yesterday with Dr. Lovena Le Implant site is stable, small amount of ecchymosis  HOLD ASA AND PLAVIX TODAY, OK TO RESUME BOTH TOMORROW  Device check today with intact function CXR today is without ptx  Wound care and activity instructions were discussed with the patient and her husband at bedside EP/device follow up has been arranged  OK to discharge from our standpoint   2. HTN     OK to resume lopressor post pacer implant  3. Stage IV CKD     Per patient has been told she has been holding steady, no plans for HD currently     (in review of last note, suspected dialysis was in the "not too distant future", thoughts were that she may be a good PD candidate if/when she reached ESRD           For questions or updates, please contact [facility] Please consult [email or web address] for contact info under Cardiology/STEMI.      Signed, Baldwin Jamaica, PA-C  02/24/2017, 9:42 AM    EP Attending  Patient seen and examined. Agree with above. The patient is doing well, s/p PPM insertion for unexplained syncope and bifascicular block. Her CXR is without  PTX and leads are stable. PPM interogation under my direction demonstrates normal DDD PM function. We will arrange followup.   Mikle Bosworth.D.

## 2017-02-24 NOTE — Plan of Care (Signed)
Problem: Safety: Goal: Ability to remain free from injury will improve Outcome: Completed/Met Date Met: 02/24/17 Provided education regarding safety from falls.  Encouraged pt to transition slowly from lying to sitting and sitting to standing.  If pt feels lightheaded or dizzy to sit down.  Take adequate rest breaks while exercising/ ambulating.    Problem: Health Behavior/Discharge Planning: Goal: Ability to manage health-related needs will improve Outcome: Completed/Met Date Met: 02/24/17 Encouraged pt to keep follow up appointments and follow restriction given by doctor.    Problem: Pain Managment: Goal: General experience of comfort will improve Outcome: Completed/Met Date Met: 02/24/17 Monitored pain using 0-10 pain scale.  Provided education regarding monitoring pain and to take appropriate medication as needed.    Problem: Tissue Perfusion: Goal: Risk factors for ineffective tissue perfusion will decrease Outcome: Completed/Met Date Met: 02/24/17 Provided pt education regarding importance of ambulation and tissue perfusion.  Discussed S&S of DVT (sharp/acute pain, redness and warmth in calf).  Problem: Activity: Goal: Risk for activity intolerance will decrease Outcome: Completed/Met Date Met: 02/24/17 Encourage patient to ambulate as tolerated and to take frequent rest breaks.

## 2017-02-24 NOTE — Discharge Summary (Signed)
Discharge Summary  Katrina Yang PJA:[id number] DOB: 06-Dec-1940  PCP: Renato Shin, MD  Admit date: 02/22/2017 Discharge date: 02/24/2017  Time spent: 25 minutes  Recommendations for Outpatient Follow-up:  1. Follow up with your PCP within a week 2. Follow up with cardiology within a week  Discharge Diagnoses:  Active Hospital Problems   Diagnosis Date Noted  . Pacemaker 02/24/2017  . Syncope, cardiogenic 02/23/2017  . Contusion of face   . Dental injury   . Syncope 02/22/2017  . Carotid artery disease ([place]) 11/05/2015  . CKD (chronic kidney disease) stage 4, GFR 15-29 ml/min (HCC) 08/28/2015  . Anemia, iron deficiency 08/28/2015  . Arthritis of knee, degenerative 03/14/2015  . Cerebrovascular accident (CVA) ([place]) 03/14/2015  . Hyperparathyroidism due to renal insufficiency ([place]) 02/01/2013  . Avitaminosis D 02/01/2013  . Pure hypercholesterolemia 08/17/2008  . Hypothyroidism 02/06/2007  . GERD 02/06/2007  . Palpitations 02/06/2007  . Osteoarthritis of spine 02/06/2007  . Diverticulosis of colon 02/06/2007    Resolved Hospital Problems   Diagnosis Date Noted Date Resolved  No resolved problems to display.    Discharge Condition: Stable  Diet recommendation: Heart healthy diet  Vitals:   02/24/17 0744 02/24/17 1100  BP: 104/74 (!) 141/75  Pulse: 80 79  Resp: 18 18  Temp: 97.9 F (36.6 C) (!) 97.3 F (36.3 C)  SpO2: 100% 99%    History of present illness:  Katrina Yang is a 76 year old Caucasian female with a past medical history significant for advanced CKD 4, CVA with no residual deficits, hypertension, hyperlipidemia, hypothyroidism, secondary hypoparathyroidism, osteoathritis of left knee who presented to the ED at [facility] on 02/23/1999 after a syncopal episode while singing at church.  Syncope most likely from arrhythmia, no warning signs, quick recovery, and no post ictal like symptoms.  Cardiology consulted and pacemaker placement on 02/23/17.  On  the day of discharge, the patient was hemodynamically stable. Denied chest pain, dyspnea, dizziness, or palpitations. She was ambulating freely. Patient and husband were advised to keep appointment with cardiologist, PCP, and nephrologist. Patient and family understand and agree to plan.  Hospital Course:  Active Problems:   Hypothyroidism   Pure hypercholesterolemia   GERD   Diverticulosis of colon   Osteoarthritis of spine   Palpitations   Avitaminosis D   Hyperparathyroidism due to renal insufficiency (HCC)   Arthritis of knee, degenerative   Cerebrovascular accident (CVA) ([place])   CKD (chronic kidney disease) stage 4, GFR 15-29 ml/min (HCC)   Anemia, iron deficiency   Carotid artery disease (HCC)   Syncope   Syncope, cardiogenic   Contusion of face   Dental injury   Pacemaker   Syncope, most likely cardiac in nature 2/2 to arrhythmia -Cardiology following and pacemaker placement 02/23/17 -Telemetry monitoring -EF 60-65% echo 02/23/17  AKI on CKD IV -avoid nephrotoxic agents  Hypothyroidism -levothyroxine  HTN -Amlodipine  HLD -crestor  Hx of CVA -c/w ASA, plavix, crestor  Left knee OA -No acute issue -uses a cane prn   Procedures:  Pacemaker placement  Consultations:  Cardiology/electrocardiology  Discharge Exam: BP (!) 141/75 (BP Location: Right Arm)   Pulse 79   Temp (!) 97.3 F (36.3 C) (Oral)   Resp 18   Ht 5\' 3"  (1.6 m)   Wt 53.4 kg (117 lb 12.8 oz) Comment: scale c  SpO2 99%   BMI 20.87 kg/m   General: 76 year old caucasian female, well developed, in no acute distress Cardiovascular: Irregular rate and rhythm; no rubs  and gallops Respiratory: Lungs are clear to auscultation, no wheezes or rhonchi noted.  Discharge Instructions You were cared for by a hospitalist during your hospital stay. If you have any questions about your discharge medications or the care you received while you were in the hospital after you are discharged,  you can call the unit and asked to speak with the hospitalist on call if the hospitalist that took care of you is not available. Once you are discharged, your primary care physician will handle any further medical issues. Please note that NO REFILLS for any discharge medications will be authorized once you are discharged, as it is imperative that you return to your primary care physician (or establish a relationship with a primary care physician if you do not have one) for your aftercare needs so that they can reassess your need for medications and monitor your lab values.   Allergies as of 02/24/2017      Reactions   Iodinated Diagnostic Agents Swelling, Anaphylaxis   Throat swelling   Metronidazole Nausea And Vomiting   Latex Rash   Sulfonamide Derivatives    Unknown allergic reaction   Codeine Rash, Nausea And Vomiting   Lovastatin Nausea Only   Unknown reaction   Sulfa Antibiotics Rash   Sulfamethoxazole-trimethoprim Nausea Only      Medication List    STOP taking these medications   amLODipine 10 MG tablet Commonly known as:  NORVASC     TAKE these medications   aspirin EC 81 MG tablet Take 81 mg by mouth daily.   calcitRIOL 0.25 MCG capsule Commonly known as:  ROCALTROL Take 1 capsule (0.25 mcg total) by mouth daily.   clopidogrel 75 MG tablet Commonly known as:  PLAVIX TAKE 1 TABLET (75 MG TOTAL) BY MOUTH DAILY.   gabapentin 100 MG capsule Commonly known as:  NEURONTIN TAKE 1 CAPSULE (100 MG TOTAL) BY MOUTH DAILY.   levothyroxine 88 MCG tablet Commonly known as:  SYNTHROID, LEVOTHROID TAKE 1 TABLET (88 MCG TOTAL) BY MOUTH DAILY BEFORE BREAKFAST.   metoprolol tartrate 25 MG tablet Commonly known as:  LOPRESSOR TAKE 1 TABLET (25 MG TOTAL) BY MOUTH 2 (TWO) TIMES DAILY.   ondansetron 4 MG disintegrating tablet Commonly known as:  ZOFRAN-ODT Take 1 tablet (4 mg total) by mouth every 8 (eight) hours as needed for nausea or vomiting.   rosuvastatin 10 MG  tablet Commonly known as:  CRESTOR Take 5 mg by mouth daily.      Allergies  Allergen Reactions  . Iodinated Diagnostic Agents Swelling and Anaphylaxis    Throat swelling  . Metronidazole Nausea And Vomiting  . Latex Rash  . Sulfonamide Derivatives     Unknown allergic reaction  . Codeine Rash and Nausea And Vomiting  . Lovastatin Nausea Only    Unknown reaction  . Sulfa Antibiotics Rash  . Sulfamethoxazole-Trimethoprim Nausea Only   Follow-up Information    [facility] Follow up on 03/09/2017.   Specialty:  Cardiology Why:  4:30PM, wound check visit Contact information: [address] [place]       Deboraha Sprang, MD Follow up on 06/01/2017.   Specialty:  Cardiology Why:  11:15AM Contact information: [address] [place] [address] [phone]            The results of significant diagnostics from this hospitalization (including imaging, microbiology, ancillary and laboratory) are listed below for reference.    Significant  Diagnostic Studies: Dg Chest 2 View  Result Date: 02/24/2017 CLINICAL DATA:  Post pacer placement EXAM: CHEST  2 VIEW COMPARISON:  02/23/2017 FINDINGS: Left pacer has been placed with leads in the right atrium and right ventricle. No pneumothorax. Heart is normal size. Biapical scarring. No confluent airspace opacities or effusions. No acute bony abnormality. IMPRESSION: No active disease. Biapical scarring. Electronically Signed   By: Rolm Baptise M.D.   On: 02/24/2017 09:54   X-ray Chest Pa And Lateral  Result Date: 02/23/2017 CLINICAL DATA:  Recent syncopal episode EXAM: CHEST  2 VIEW COMPARISON:  02/22/2017 FINDINGS: Cardiac shadow is stable. The lungs are well aerated bilaterally. No focal infiltrate or sizable effusion is seen. Mild degenerative changes of the thoracic spine are noted. IMPRESSION: No acute abnormality noted. Electronically  Signed   By: Inez Catalina M.D.   On: 02/23/2017 07:46   Ct Head Wo Contrast  Result Date: 02/22/2017 CLINICAL DATA:  76 year old female with syncope and fall this morning with head, face and neck injury. Left facial pain and headache. EXAM: CT HEAD WITHOUT CONTRAST CT MAXILLOFACIAL WITHOUT CONTRAST CT CERVICAL SPINE WITHOUT CONTRAST TECHNIQUE: Multidetector CT imaging of the head, cervical spine, and maxillofacial structures were performed using the standard protocol without intravenous contrast. Multiplanar CT image reconstructions of the cervical spine and maxillofacial structures were also generated. COMPARISON:  03/03/2013 brain and cervical spine MR. 12/08/2004 head CT. FINDINGS: CT HEAD FINDINGS Brain: No evidence of acute infarction, hemorrhage, hydrocephalus, extra-axial collection or mass lesion/mass effect. Mild atrophy and mild chronic small-vessel white matter ischemic changes noted. Vascular: Intracranial atherosclerotic calcifications noted. Skull: Normal. Negative for fracture or focal lesion. Other: Left forehead soft tissue swelling noted. CT MAXILLOFACIAL FINDINGS Osseous: No fracture or mandibular dislocation. No destructive process. Orbits: Negative. No traumatic or inflammatory finding. Sinuses: Clear. Soft tissues: Left forehead and facial soft tissue swelling noted. CT CERVICAL SPINE FINDINGS Alignment: Normal. Skull base and vertebrae: No acute fracture. No primary bone lesion or focal pathologic process. Soft tissues and spinal canal: No prevertebral fluid or swelling. No visible canal hematoma. Disc levels: Multilevel degenerative disc disease/ spondylosis noted, mild to moderate from C4-C7. Upper chest: No acute abnormality Other: None IMPRESSION: 1. No evidence of acute intracranial abnormality. Atrophy and chronic small-vessel white matter ischemic changes. 2. Left facial and forehead soft tissue swelling without fracture. 3. No static evidence of acute injury to the cervical spine.  Mild to moderate degenerative disc disease/spondylosis from C4-C7. Electronically Signed   By: Margarette Canada M.D.   On: 02/22/2017 14:53   Ct Cervical Spine Wo Contrast  Result Date: 02/22/2017 CLINICAL DATA:  76 year old female with syncope and fall this morning with head, face and neck injury. Left facial pain and headache. EXAM: CT HEAD WITHOUT CONTRAST CT MAXILLOFACIAL WITHOUT CONTRAST CT CERVICAL SPINE WITHOUT CONTRAST TECHNIQUE: Multidetector CT imaging of the head, cervical spine, and maxillofacial structures were performed using the standard protocol without intravenous contrast. Multiplanar CT image reconstructions of the cervical spine and maxillofacial structures were also generated. COMPARISON:  03/03/2013 brain and cervical spine MR. 12/08/2004 head CT. FINDINGS: CT HEAD FINDINGS Brain: No evidence of acute infarction, hemorrhage, hydrocephalus, extra-axial collection or mass lesion/mass effect. Mild atrophy and mild chronic small-vessel white matter ischemic changes noted. Vascular: Intracranial atherosclerotic calcifications noted. Skull: Normal. Negative for fracture or focal lesion. Other: Left forehead soft tissue swelling noted. CT MAXILLOFACIAL FINDINGS Osseous: No fracture or mandibular dislocation. No destructive process. Orbits: Negative. No traumatic or inflammatory finding. Sinuses: Clear.  Soft tissues: Left forehead and facial soft tissue swelling noted. CT CERVICAL SPINE FINDINGS Alignment: Normal. Skull base and vertebrae: No acute fracture. No primary bone lesion or focal pathologic process. Soft tissues and spinal canal: No prevertebral fluid or swelling. No visible canal hematoma. Disc levels: Multilevel degenerative disc disease/ spondylosis noted, mild to moderate from C4-C7. Upper chest: No acute abnormality Other: None IMPRESSION: 1. No evidence of acute intracranial abnormality. Atrophy and chronic small-vessel white matter ischemic changes. 2. Left facial and forehead soft  tissue swelling without fracture. 3. No static evidence of acute injury to the cervical spine. Mild to moderate degenerative disc disease/spondylosis from C4-C7. Electronically Signed   By: Margarette Canada M.D.   On: 02/22/2017 14:53   Dg Chest Port 1 View  Result Date: 02/22/2017 CLINICAL DATA:  Syncope.  Fall EXAM: PORTABLE CHEST 1 VIEW COMPARISON:  06/25/2015 FINDINGS: Heart size upper normal. Normal pulmonary vascularity. Lungs are clear without infiltrate or effusion. IMPRESSION: No active disease. Electronically Signed   By: Franchot Gallo M.D.   On: 02/22/2017 13:26   Dg Hand Complete Left  Result Date: 02/22/2017 CLINICAL DATA:  Syncope.  Fall.  Hand pain EXAM: LEFT HAND - COMPLETE 3+ VIEW COMPARISON:  None. FINDINGS: Advanced degenerative change at the base of thumb with joint space narrowing and spurring. Extension deformity of the first MCP joint. Osteoarthritis in the second and third DIP joints. Early chondrocalcinosis in the triangular fibrocartilage. Negative for fracture IMPRESSION: Negative for fracture. Electronically Signed   By: Franchot Gallo M.D.   On: 02/22/2017 13:27   Ct Maxillofacial Wo Contrast  Result Date: 02/22/2017 CLINICAL DATA:  76 year old female with syncope and fall this morning with head, face and neck injury. Left facial pain and headache. EXAM: CT HEAD WITHOUT CONTRAST CT MAXILLOFACIAL WITHOUT CONTRAST CT CERVICAL SPINE WITHOUT CONTRAST TECHNIQUE: Multidetector CT imaging of the head, cervical spine, and maxillofacial structures were performed using the standard protocol without intravenous contrast. Multiplanar CT image reconstructions of the cervical spine and maxillofacial structures were also generated. COMPARISON:  03/03/2013 brain and cervical spine MR. 12/08/2004 head CT. FINDINGS: CT HEAD FINDINGS Brain: No evidence of acute infarction, hemorrhage, hydrocephalus, extra-axial collection or mass lesion/mass effect. Mild atrophy and mild chronic small-vessel  white matter ischemic changes noted. Vascular: Intracranial atherosclerotic calcifications noted. Skull: Normal. Negative for fracture or focal lesion. Other: Left forehead soft tissue swelling noted. CT MAXILLOFACIAL FINDINGS Osseous: No fracture or mandibular dislocation. No destructive process. Orbits: Negative. No traumatic or inflammatory finding. Sinuses: Clear. Soft tissues: Left forehead and facial soft tissue swelling noted. CT CERVICAL SPINE FINDINGS Alignment: Normal. Skull base and vertebrae: No acute fracture. No primary bone lesion or focal pathologic process. Soft tissues and spinal canal: No prevertebral fluid or swelling. No visible canal hematoma. Disc levels: Multilevel degenerative disc disease/ spondylosis noted, mild to moderate from C4-C7. Upper chest: No acute abnormality Other: None IMPRESSION: 1. No evidence of acute intracranial abnormality. Atrophy and chronic small-vessel white matter ischemic changes. 2. Left facial and forehead soft tissue swelling without fracture. 3. No static evidence of acute injury to the cervical spine. Mild to moderate degenerative disc disease/spondylosis from C4-C7. Electronically Signed   By: Margarette Canada M.D.   On: 02/22/2017 14:53    Microbiology: Recent Results (from the past 240 hour(s))  Surgical pcr screen     Status: None   Collection Time: 02/22/17  9:31 PM  Result Value Ref Range Status   MRSA, PCR NEGATIVE NEGATIVE Final  Staphylococcus aureus NEGATIVE NEGATIVE Final    Comment: (NOTE) The Xpert SA Assay (FDA approved for NASAL specimens in patients 49 years of age and older), is one component of a comprehensive surveillance program. It is not intended to diagnose infection nor to guide or monitor treatment.      Labs: Basic Metabolic Panel:  Recent Labs Lab 02/22/17 1240 02/23/17 0627 02/24/17 0524  NA 138 139 141  K 4.4 3.9 4.7  CL 109 112* 114*  CO2 20* 21* 16*  GLUCOSE 99 94 156*  BUN 45* 37* 38*  CREATININE 3.88*  3.45* 3.22*  CALCIUM 9.4 9.0 9.2   Liver Function Tests:  Recent Labs Lab 02/23/17 0627 02/24/17 0524  AST 22 23  ALT 13* 10*  ALKPHOS 56 54  BILITOT 0.6 0.5  PROT 6.2* 6.6  ALBUMIN 3.2* 3.5   No results for input(s): LIPASE, AMYLASE in the last 168 hours. No results for input(s): AMMONIA in the last 168 hours. CBC:  Recent Labs Lab 02/22/17 1240 02/23/17 0627 02/24/17 0524  WBC 8.3 7.3 11.0*  NEUTROABS 6.0  --   --   HGB 11.1* 9.4* 10.3*  HCT 33.7* 30.0* 32.3*  MCV 90.1 90.9 90.0  PLT 292 277 294   Cardiac Enzymes:  Recent Labs Lab 02/22/17 1622 02/22/17 1851 02/22/17 2122  TROPONINI <0.03 <0.03 <0.03   BNP: BNP (last 3 results) No results for input(s): BNP in the last 8760 hours.  ProBNP (last 3 results) No results for input(s): PROBNP in the last 8760 hours.  CBG:  Recent Labs Lab 02/22/17 1242 02/23/17 0541 02/24/17 0742  GLUCAP 87 93 129*       Signed:  Kayleen Memos, MD [facility] 02/24/2017, 11:37 AM

## 2017-03-02 ENCOUNTER — Ambulatory Visit (INDEPENDENT_AMBULATORY_CARE_PROVIDER_SITE_OTHER): Payer: Self-pay | Admitting: *Deleted

## 2017-03-02 ENCOUNTER — Telehealth: Payer: Self-pay | Admitting: Internal Medicine

## 2017-03-02 DIAGNOSIS — R55 Syncope and collapse: Secondary | ICD-10-CM

## 2017-03-02 NOTE — Telephone Encounter (Signed)
New Message  Pt call requesting to speak with RN about her bandage. Pt states it was irritating her skin and is not using a cloth bandage. Pt would like to know if this is okay or does she need to return to the office to get it replaced. Please call back to discuss

## 2017-03-02 NOTE — Telephone Encounter (Signed)
Routing to device clinic 

## 2017-03-02 NOTE — Telephone Encounter (Signed)
Patient states that she removed her bandage today d/t ? Irritation caused by the bandage. Appt scheduled with Device clinic for today @ 1430. Patient verbalized understanding.

## 2017-03-03 ENCOUNTER — Ambulatory Visit (INDEPENDENT_AMBULATORY_CARE_PROVIDER_SITE_OTHER): Payer: Medicare Other | Admitting: Endocrinology

## 2017-03-03 ENCOUNTER — Encounter: Payer: Self-pay | Admitting: Endocrinology

## 2017-03-03 VITALS — BP 180/98 | HR 88 | Wt 117.8 lb

## 2017-03-03 DIAGNOSIS — E039 Hypothyroidism, unspecified: Secondary | ICD-10-CM | POA: Diagnosis not present

## 2017-03-03 DIAGNOSIS — N2581 Secondary hyperparathyroidism of renal origin: Secondary | ICD-10-CM | POA: Diagnosis not present

## 2017-03-03 DIAGNOSIS — D631 Anemia in chronic kidney disease: Secondary | ICD-10-CM

## 2017-03-03 DIAGNOSIS — D649 Anemia, unspecified: Secondary | ICD-10-CM

## 2017-03-03 DIAGNOSIS — Z0000 Encounter for general adult medical examination without abnormal findings: Secondary | ICD-10-CM | POA: Diagnosis not present

## 2017-03-03 DIAGNOSIS — E559 Vitamin D deficiency, unspecified: Secondary | ICD-10-CM

## 2017-03-03 DIAGNOSIS — N184 Chronic kidney disease, stage 4 (severe): Secondary | ICD-10-CM | POA: Diagnosis not present

## 2017-03-03 DIAGNOSIS — E7800 Pure hypercholesterolemia, unspecified: Secondary | ICD-10-CM | POA: Diagnosis not present

## 2017-03-03 DIAGNOSIS — M810 Age-related osteoporosis without current pathological fracture: Secondary | ICD-10-CM

## 2017-03-03 DIAGNOSIS — I10 Essential (primary) hypertension: Secondary | ICD-10-CM

## 2017-03-03 LAB — IBC PANEL
Iron: 74 ug/dL (ref 42–145)
SATURATION RATIOS: 21.7 % (ref 20.0–50.0)
Transferrin: 244 mg/dL (ref 212.0–360.0)

## 2017-03-03 LAB — VITAMIN D 25 HYDROXY (VIT D DEFICIENCY, FRACTURES): VITD: 22.41 ng/mL — AB (ref 30.00–100.00)

## 2017-03-03 MED ORDER — LEVOTHYROXINE SODIUM 75 MCG PO TABS: 75.0000 ug | ORAL_TABLET | Freq: Every day | ORAL | 3 refills | Status: DC

## 2017-03-03 MED ORDER — AMLODIPINE BESYLATE 5 MG PO TABS: 5.0000 mg | ORAL_TABLET | Freq: Every day | ORAL | 3 refills | Status: DC

## 2017-03-03 NOTE — Patient Instructions (Addendum)
I have sent 2 prescriptions to your pharmacy: to reduce the levothyroxine, and to resume amlodipine at just 5 mg daily.   blood tests are requested for you today.  We'll let you know about the results.  good diet and exercise significantly improves your health.  please let me know if you wish to be referred to a dietician.  high blood sugar is very risky to your health.  you should see an eye doctor and dentist every year.  It is very important to get all recommended vaccinations.  Please consider these measures for your health:  minimize alcohol.  Do not use tobacco products.  Have a colonoscopy at least every 10 years from age 64.  Women should have an annual mammogram from age 81.  Keep firearms safely stored.  Always use seat belts.  have working smoke alarms in your home.  See an eye doctor and dentist regularly.  Never drive under the influence of alcohol or drugs (including prescription drugs).  Those with fair skin should take precautions against the sun, and should carefully examine their skin once per month, for any new or changed moles. please let me know what your wishes would be, if artificial life support measures should become necessary.  It is critically important to prevent falling down (keep floor areas well-lit, dry, and free of loose objects.  If you have a cane, walker, or wheelchair, you should use it, even for short trips around the house.  Wear flat-soled shoes.  Also, try not to rush).   Please come back for a follow-up appointment in 1 month.

## 2017-03-03 NOTE — Progress Notes (Addendum)
Subjective:    Patient ID: Katrina Yang, female    DOB: [birth year], 76 y.o.   MRN: [id number]  HPI The state of at least three ongoing medical problems is addressed today, with interval history of each noted here: Anemia: she denies any further BRBPR.  This is a stable problem.  Secondary hyperparathyroidism: she denies leg cramps. This is a stable problem. Hypothyroidism: denies dry skin.  This is a stable problem. HTN: norvasc was d/c'ed after recent syncopal episode.  This problem is worse. Past Medical History:  Diagnosis Date  . Carotid bruit    a.  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  . Cervical spondylosis without myelopathy 02/01/2015  . CKD (chronic kidney disease), stage IV ([employer])   . Colonic polyp    hx of  . CVA (cerebral vascular accident) ([place])    hx of  . Degenerative joint disease   . Diverticulosis of colon (without mention of hemorrhage)   . History of hysterectomy   . Hypertension    a. Renal artery dopplers in 2012 with no evidence for significant renal artery stenosis.   . Occipital neuralgia of left side 08/30/2013  . Osteoporosis, unspecified   . Palpitations    a. Event monitor (8/14) with no significant abnormalities.   . Peripheral neuropathic pain   . Personal history of unspecified urinary disorder   . Pulmonary nodule   . Pure hypercholesterolemia   . Secondary hyperparathyroidism (of renal origin)   . Unspecified hypothyroidism     Past Surgical History:  Procedure Laterality Date  . ABDOMINAL HYSTERECTOMY    . BACK SURGERY     diskectomy L-5. Multiple back surgeries  . MENISCECTOMY Left 11/14/15  . PACEMAKER IMPLANT N/A 02/23/2017   Procedure: PACEMAKER IMPLANT;  Surgeon: Evans Lance, MD;  Location: [facility];  Service: Cardiovascular;  Laterality: N/A;    Social History   Social History  . Marital status: Married    Spouse name: [name]  . Number of children: 1  . Years of education: 2   Occupational History  .  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 Smoking status: Never Smoker  . Smokeless tobacco: Never Used  . Alcohol use No  . Drug use: No  . Sexual activity: Not on file   Other Topics Concern  . Not on file   Social History Narrative   Pt lives at home with her spouse.   Patient drinks 1 glass of tea every other day.   Patient is right handed.    Current Outpatient Prescriptions on File Prior to Visit  Medication Sig Dispense Refill  . aspirin EC 81 MG tablet Take 81 mg by mouth daily.    . calcitRIOL (ROCALTROL) 0.25 MCG capsule Take 1 capsule (0.25 mcg total) by mouth daily. 30 capsule 2  . clopidogrel (PLAVIX) 75 MG tablet TAKE 1 TABLET (75 MG TOTAL) BY MOUTH DAILY. 90 tablet 1  . gabapentin (NEURONTIN) 100 MG capsule TAKE 1 CAPSULE (100 MG TOTAL) BY MOUTH DAILY. 90 capsule 3  . metoprolol tartrate (LOPRESSOR) 25 MG tablet TAKE 1 TABLET (25 MG TOTAL) BY MOUTH 2 (TWO) TIMES DAILY. 180 tablet 3  . ondansetron (ZOFRAN-ODT) 4 MG disintegrating tablet Take 1 tablet (4 mg total) by mouth every 8 (eight) hours as needed for nausea or vomiting. 21 tablet 0  . rosuvastatin (CRESTOR) 10 MG tablet Take 5 mg by mouth daily.  No current facility-administered medications on file prior to visit.     Allergies  Allergen Reactions  . Iodinated Diagnostic Agents Swelling and Anaphylaxis    Throat swelling  . Metronidazole Nausea And Vomiting  . Latex Rash  . Sulfonamide Derivatives     Unknown allergic reaction  . Codeine Rash and Nausea And Vomiting  . Lovastatin Nausea Only    Unknown reaction  . Sulfa Antibiotics Rash  . Sulfamethoxazole-Trimethoprim Nausea Only    Family History  Problem Relation Age of Onset  . Kidney cancer Father   . Heart attack Father   . Heart disease Father   . Colon cancer Mother   . Ovarian cancer Mother   . Heart disease Mother   . Heart failure Mother   . Stomach cancer Neg Hx     BP (!) 180/98    Pulse 88   Wt 117 lb 12.8 oz (53.4 kg)   SpO2 97%   BMI 20.87 kg/m   Review of Systems Denies hematuria.  She has lost a few lbs.      Objective:   Physical Exam VITAL SIGNS:  See vs page. GENERAL: no distress. LUNGS:  Clear to auscultation. HEART:  Regular rate and rhythm; soft systolic murmur noted. Normal S1,S2.   Ext: no leg edema.   Lab Results  Component Value Date   TSH 0.112 (L) 02/24/2017    ecg machine is not working today    Assessment & Plan:  Anemia: due for recheck Secondary hyperparathyroidism, of renal origin: due for recheck.  Hypothyroidism: overreplaced. HTN: she needs increased rx  I have sent 2 prescriptions to your pharmacy: to reduce the levothyroxine, and to resume amlodipine at just 5 mg daily.   blood tests are requested for you today.  We'll let you know about the results.   Subjective:   Patient here for Medicare annual wellness visit and management of other chronic and acute problems.     Risk factors: advanced age    11 of Physicians Providing Medical Care to Patient:  See "snapshot"   Activities of Daily Living: In your present state of health, do you have any difficulty performing the following activities (lives with husband)?:  Preparing food and eating?: No  Bathing yourself: No  Getting dressed: No  Using the toilet: No  Moving around from place to place: No  In the past year have you fallen or had a near fall?: No    Home Safety: Has smoke detector and wears seat belts. Firearms are safely stored. No excess sun exposure.    Opioid Use: none   Diet and Exercise  Current exercise habits: pt says good Dietary issues discussed: pt reports a healthy diet   Depression Screen  Q1: Over the past two weeks, have you felt down, depressed or hopeless? no  Q2: Over the past two weeks, have you felt little interest or pleasure in doing things? no   The following portions of the patient's history were reviewed and updated as  appropriate: allergies, current medications, past family history, past medical history, past social history, past surgical history and problem list.   Review of Systems  Denies hearing loss, and visual loss Objective:   Vision:  Advertising account executive, so he declines VA today.  Hearing: grossly normal Body mass index:  See vs page Msk: pt easily and quickly performs "get-up-and-go" from a sitting position Cognitive Impairment Assessment: cognition, memory and judgment appear normal.  remembers 3/3 at 5 minutes.  excellent  recall.  can easily read and write a sentence.  alert and oriented x 3   Assessment:   Medicare wellness utd on preventive parameters    Plan:   During the course of the visit the patient was educated and counseled about appropriate screening and preventive services including:        Fall prevention is advised today  Screening mammography is UTD Bone densitometry screening is ordered today Diabetes screening  Nutrition counseling is offered   Vaccines are updated as needed  Patient Instructions (the written plan) was given to the patient.

## 2017-03-05 ENCOUNTER — Encounter: Payer: Self-pay | Admitting: Neurology

## 2017-03-05 ENCOUNTER — Ambulatory Visit (INDEPENDENT_AMBULATORY_CARE_PROVIDER_SITE_OTHER): Payer: Medicare Other | Admitting: Neurology

## 2017-03-05 VITALS — BP 139/70 | HR 76 | Ht 63.0 in | Wt 120.0 lb

## 2017-03-05 DIAGNOSIS — M5481 Occipital neuralgia: Secondary | ICD-10-CM | POA: Diagnosis not present

## 2017-03-05 LAB — PTH, INTACT AND CALCIUM
Calcium: 9.6 mg/dL (ref 8.6–10.4)
PTH: 38 pg/mL (ref 14–64)

## 2017-03-05 NOTE — Progress Notes (Signed)
Reason for visit: Left occipital neuralgia  Katrina Yang is an 76 y.o. female  History of present illness:  Katrina Yang is a 76 year old right-handed white female with a history of left occipital neuralgia.  The patient recent was in the hospital 22 February 2017 with a syncopal event.  The patient was felt to have heart block, and a pacemaker was inserted during that hospitalization.  The patient claims at this has resulted in increased energy, she feels better.  She did strike her head during the syncopal episode and damaged 4 teeth and bruised the left side of her face and arm.  The patient has chronic problems with degenerative arthritis of the left knee that does impair her ability to walk some.  The patient does have occasional episodes of occipital neuralgia, she is on gabapentin taking 100 mg at night which seems to help her sleep better, she does not have much trouble with neuralgia during the day.  The patient has stage IV chronic renal insufficiency, he cannot take high doses of gabapentin.  That she returns to this office for an evaluation.   Past Medical History:  Diagnosis Date  . Carotid bruit    a.  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  . Cervical spondylosis without myelopathy 02/01/2015  . CKD (chronic kidney disease), stage IV ([place])   . Colonic polyp    hx of  . CVA (cerebral vascular accident) ([place])    hx of  . Degenerative joint disease   . Diverticulosis of colon (without mention of hemorrhage)   . History of hysterectomy   . Hypertension    a. Renal artery dopplers in 2012 with no evidence for significant renal artery stenosis.   . Occipital neuralgia of left side 08/30/2013  . Osteoporosis, unspecified   . Palpitations    a. Event monitor (8/14) with no significant abnormalities.   . Peripheral neuropathic pain   . Personal history of unspecified urinary disorder   . Pulmonary nodule   . Pure hypercholesterolemia   . Secondary hyperparathyroidism (of  renal origin)   . Unspecified hypothyroidism     Past Surgical History:  Procedure Laterality Date  . ABDOMINAL HYSTERECTOMY    . BACK SURGERY     diskectomy L-5. Multiple back surgeries  . MENISCECTOMY Left 11/14/15  . PACEMAKER IMPLANT N/A 02/23/2017   Procedure: PACEMAKER IMPLANT;  Surgeon: Evans Lance, MD;  Location: [facility];  Service: Cardiovascular;  Laterality: N/A;    Family History  Problem Relation Age of Onset  . Kidney cancer Father   . Heart attack Father   . Heart disease Father   . Colon cancer Mother   . Ovarian cancer Mother   . Heart disease Mother   . Heart failure Mother   . Stomach cancer Neg Hx     Social history:  reports that she has never smoked. She has never used smokeless tobacco. She reports that she does not drink alcohol or use drugs.    Allergies  Allergen Reactions  . Iodinated Diagnostic Agents Swelling and Anaphylaxis    Throat swelling  . Metronidazole Nausea And Vomiting  . Latex Rash  . Sulfonamide Derivatives     Unknown allergic reaction  . Codeine Rash and Nausea And Vomiting  . Lovastatin Nausea Only    Unknown reaction  . Sulfa Antibiotics Rash  . Sulfamethoxazole-Trimethoprim Nausea Only    Medications:  Prior to Admission medications   Medication Sig Start Date End Date  Taking? Authorizing Provider  amLODipine (NORVASC) 5 MG tablet Take 1 tablet (5 mg total) by mouth daily. 03/03/17  Yes Renato Shin, MD  aspirin EC 81 MG tablet Take 81 mg by mouth daily.   Yes [provider]  calcitRIOL (ROCALTROL) 0.25 MCG capsule Take 1 capsule (0.25 mcg total) by mouth daily. 12/18/16  Yes Renato Shin, MD  clopidogrel (PLAVIX) 75 MG tablet TAKE 1 TABLET (75 MG TOTAL) BY MOUTH DAILY. 01/15/17  Yes Kathrynn Ducking, MD  gabapentin (NEURONTIN) 100 MG capsule TAKE 1 CAPSULE (100 MG TOTAL) BY MOUTH DAILY. 10/20/16  Yes Kathrynn Ducking, MD  levothyroxine (SYNTHROID, LEVOTHROID) 75 MCG tablet Take 1 tablet (75 mcg  total) by mouth daily. 03/03/17  Yes Renato Shin, MD  metoprolol tartrate (LOPRESSOR) 25 MG tablet TAKE 1 TABLET (25 MG TOTAL) BY MOUTH 2 (TWO) TIMES DAILY. 09/22/16  Yes Renato Shin, MD  rosuvastatin (CRESTOR) 10 MG tablet Take 5 mg by mouth daily.   Yes [provider]    ROS:  Out of a complete 14 system review of symptoms, the patient complains only of the following symptoms, and all other reviewed systems are negative.  Syncope   Blood pressure 139/70, pulse 76, height 5\' 3"  (1.6 m), weight 120 lb (54.4 kg).  Physical Exam  General: The patient is alert and cooperative at the time of the examination.  Skin: No significant peripheral edema is noted.   Neurologic Exam  Mental status: The patient is alert and oriented x 3 at the time of the examination. The patient has apparent normal recent and remote memory, with an apparently normal attention span and concentration ability.   Cranial nerves: Facial symmetry is present. Speech is normal, no aphasia or dysarthria is noted. Extraocular movements are full. Visual fields are full.  Ecchymosis is noted on the left face and forehead.  Motor: The patient has good strength in all 4 extremities.  Sensory examination: Soft touch sensation is symmetric on the face, arms, and legs.  Coordination: The patient has good finger-nose-finger and heel-to-shin bilaterally.  Gait and station: The patient has a normal gait. Tandem gait is normal. Romberg is negative. No drift is seen.  Reflexes: Deep tendon reflexes are symmetric.   CT brain and cervical 02/22/17:  IMPRESSION: 1. No evidence of acute intracranial abnormality. Atrophy and chronic small-vessel white matter ischemic changes. 2. Left facial and forehead soft tissue swelling without fracture. 3. No static evidence of acute injury to the cervical spine. Mild to moderate degenerative disc disease/spondylosis from C4-C7.  * CT scan images were reviewed online. I  agree with the written report.    Assessment/Plan:  1.  Left occipital neuralgia  2.  Recent syncopal event, heart block, pacemaker placement  The patient denies any injury to the neck with the recent fall.  CT of the head and neck have been done.  The patient will continue the gabapentin, if needed the dose can be increased slightly, the patient likely can take up to 300 mg daily of the gabapentin.  She will follow-up in 6 months.  Jill Alexanders MD 03/05/2017 10:05 AM  [facility] [address] [place], [place] [address]  Phone [phone] Fax [phone]

## 2017-03-06 ENCOUNTER — Encounter: Payer: Self-pay | Admitting: Endocrinology

## 2017-03-09 ENCOUNTER — Ambulatory Visit: Payer: Medicare Other

## 2017-03-09 DIAGNOSIS — N184 Chronic kidney disease, stage 4 (severe): Secondary | ICD-10-CM | POA: Diagnosis not present

## 2017-03-09 DIAGNOSIS — N2581 Secondary hyperparathyroidism of renal origin: Secondary | ICD-10-CM | POA: Diagnosis not present

## 2017-03-09 DIAGNOSIS — D631 Anemia in chronic kidney disease: Secondary | ICD-10-CM | POA: Diagnosis not present

## 2017-03-09 DIAGNOSIS — I129 Hypertensive chronic kidney disease with stage 1 through stage 4 chronic kidney disease, or unspecified chronic kidney disease: Secondary | ICD-10-CM | POA: Diagnosis not present

## 2017-03-11 ENCOUNTER — Ambulatory Visit (INDEPENDENT_AMBULATORY_CARE_PROVIDER_SITE_OTHER): Payer: Medicare Other | Admitting: *Deleted

## 2017-03-11 DIAGNOSIS — R55 Syncope and collapse: Secondary | ICD-10-CM | POA: Diagnosis not present

## 2017-03-11 LAB — CUP PACEART INCLINIC DEVICE CHECK
Brady Statistic RA Percent Paced: 3.7 %
Implantable Lead Implant Date: 20181022
Implantable Lead Location: 753859
Implantable Lead Location: 753860
Lead Channel Pacing Threshold Amplitude: 0.5 V
Lead Channel Pacing Threshold Pulse Width: 0.5 ms
Lead Channel Pacing Threshold Pulse Width: 0.5 ms
Lead Channel Sensing Intrinsic Amplitude: 5 mV
Lead Channel Sensing Intrinsic Amplitude: 9.3 mV
Lead Channel Setting Pacing Amplitude: 0.75 V
Lead Channel Setting Pacing Amplitude: 3.5 V
Lead Channel Setting Pacing Pulse Width: 0.5 ms
MDC IDC LEAD IMPLANT DT: 20181022
MDC IDC MSMT BATTERY REMAINING LONGEVITY: 139 mo
MDC IDC MSMT BATTERY VOLTAGE: 3.1 V
MDC IDC MSMT LEADCHNL RA IMPEDANCE VALUE: 412.5 Ohm
MDC IDC MSMT LEADCHNL RA PACING THRESHOLD AMPLITUDE: 0.75 V
MDC IDC MSMT LEADCHNL RA PACING THRESHOLD AMPLITUDE: 0.75 V
MDC IDC MSMT LEADCHNL RA PACING THRESHOLD PULSEWIDTH: 0.5 ms
MDC IDC MSMT LEADCHNL RV IMPEDANCE VALUE: 562.5 Ohm
MDC IDC MSMT LEADCHNL RV PACING THRESHOLD AMPLITUDE: 0.5 V
MDC IDC MSMT LEADCHNL RV PACING THRESHOLD PULSEWIDTH: 0.5 ms
MDC IDC PG IMPLANT DT: 20181022
MDC IDC SESS DTM: 20181107123116
MDC IDC SET LEADCHNL RV SENSING SENSITIVITY: 2 mV
MDC IDC STAT BRADY RV PERCENT PACED: 0.08 %
Pulse Gen Serial Number: 8958692

## 2017-03-11 NOTE — Progress Notes (Signed)
Wound check appointment. Steri-strips removed. Wound without redness or edema. Incision edges approximated, wound well healed. Normal device function. Thresholds, sensing, and impedances consistent with implant measurements. Device programmed at 3.5V and auto capture programmed on for extra safety margin until 3 month visit. Histogram distribution appropriate for patient and level of activity. 2 mode switches - EGMs show AT (<1%). No  high ventricular rates noted. Patient educated about wound care, arm mobility, lifting restrictions. ROV with SK 1/28

## 2017-03-12 NOTE — Progress Notes (Signed)
Wound check appt d/t patient call for accidental steri strip removal. Wound assessed. Incision edges approximated, wound healing well without redness or edema. Steri strips reapplied to the incision site. Patient to follow up as scheduled.

## 2017-03-16 DIAGNOSIS — G8929 Other chronic pain: Secondary | ICD-10-CM | POA: Diagnosis not present

## 2017-03-16 DIAGNOSIS — M1712 Unilateral primary osteoarthritis, left knee: Secondary | ICD-10-CM | POA: Diagnosis not present

## 2017-03-16 DIAGNOSIS — M25562 Pain in left knee: Secondary | ICD-10-CM | POA: Diagnosis not present

## 2017-03-27 ENCOUNTER — Encounter: Payer: Self-pay | Admitting: Endocrinology

## 2017-04-03 ENCOUNTER — Other Ambulatory Visit (INDEPENDENT_AMBULATORY_CARE_PROVIDER_SITE_OTHER): Payer: Medicare Other

## 2017-04-03 ENCOUNTER — Telehealth: Payer: Self-pay

## 2017-04-03 DIAGNOSIS — E039 Hypothyroidism, unspecified: Secondary | ICD-10-CM | POA: Diagnosis not present

## 2017-04-03 DIAGNOSIS — E7800 Pure hypercholesterolemia, unspecified: Secondary | ICD-10-CM

## 2017-04-03 LAB — T4, FREE: FREE T4: 0.97 ng/dL (ref 0.60–1.60)

## 2017-04-03 LAB — LIPID PANEL
CHOL/HDL RATIO: 2
Cholesterol: 216 mg/dL — ABNORMAL HIGH (ref 0–200)
HDL: 97.6 mg/dL (ref >39.00)
LDL CALC: 106 mg/dL — AB (ref 0–99)
NONHDL: 118.24
Triglycerides: 60 mg/dL (ref 0.0–149.0)
VLDL: 12 mg/dL (ref 0.0–40.0)

## 2017-04-03 LAB — TSH: TSH: 1.87 u[IU]/mL (ref 0.35–4.50)

## 2017-04-03 NOTE — Telephone Encounter (Signed)
Ok, I ordered 

## 2017-04-03 NOTE — Telephone Encounter (Signed)
Patient is here & said she was told to come in for labs. She isn't on the schedule but says she is supposed to have labs for Cholesterol & thyroid. Can you out these in?

## 2017-04-04 ENCOUNTER — Encounter: Payer: Self-pay | Admitting: Endocrinology

## 2017-04-06 ENCOUNTER — Telehealth: Payer: Self-pay | Admitting: *Deleted

## 2017-04-06 NOTE — Telephone Encounter (Signed)
Katrina Yang reports that she feels like her PPM has moved toward her shoulder some and is experiencing occasional short "glitches" that she is describing as her heart stopping. I assisted her in sending a remote transmission. Transmission received, normal device function. SR 90s, 14% Atrial pacing at lower rate, one 8 second mode switch (AT). I advised her to check her BP when she experiences these glitches and to take her time changing positions. She denies redness or swelling of the device site and syncope. I have advised her to call back if she experiences any change in the appearance of device pocket. She verbalizes understanding.

## 2017-04-15 ENCOUNTER — Encounter: Payer: Self-pay | Admitting: Endocrinology

## 2017-04-21 ENCOUNTER — Other Ambulatory Visit: Payer: Self-pay | Admitting: Endocrinology

## 2017-04-21 ENCOUNTER — Other Ambulatory Visit: Payer: Self-pay

## 2017-04-21 ENCOUNTER — Telehealth: Payer: Self-pay | Admitting: Endocrinology

## 2017-04-21 NOTE — Telephone Encounter (Signed)
Pt wife has left Mychart messages on 12/1 and 12/12 and has not heard anything. Please advise? Thank you!  Call pt @ [phone].

## 2017-04-21 NOTE — Telephone Encounter (Signed)
It is 1 tab qd.  You could ask your pharmacy, but i'm pretty sure the recall was on combination pills containing amlodipine, not by itself.

## 2017-04-21 NOTE — Telephone Encounter (Signed)
Clarified dosage with patient & she stated that she would keep a close check on her BP. If her readings became too high she would give [place] a call.

## 2017-04-21 NOTE — Telephone Encounter (Signed)
I read the mychart messages & forwarded you a question. It seems patient is concerned about amlodipine & cancer causing scare. She is also wondering if due to her kidneys she should be taking one or two daily?

## 2017-05-06 ENCOUNTER — Encounter: Payer: Self-pay | Admitting: Endocrinology

## 2017-05-06 NOTE — Progress Notes (Unsigned)
Flu vaccine completed

## 2017-05-19 ENCOUNTER — Telehealth: Payer: Self-pay | Admitting: Gastroenterology

## 2017-05-19 ENCOUNTER — Other Ambulatory Visit: Payer: Self-pay

## 2017-05-19 MED ORDER — AMOXICILLIN-POT CLAVULANATE 500-125 MG PO TABS: 1.0000 | ORAL_TABLET | Freq: Two times a day (BID) | ORAL | 0 refills | Status: AC

## 2017-05-19 NOTE — Telephone Encounter (Signed)
Spoke to patient she had eaten some spicy foods last week and on Sunday she started having RLQ pain, no fever, no blood in her stool. She is moving her bowels but is having a lot of pain. She does not tolerate Flagyl.

## 2017-05-19 NOTE — Telephone Encounter (Signed)
Thanks for the note. Is she having severe pain or fevers? Does it feel like prior attack from diverticulitis and did she respond to antibiotics given to her last summer? She does have a history of diverticulitis with colonoscopy in the past year. If she has symptoms that are concerning for diverticulitis why don't try Augmentin for one week. I noted she has stage 4 CKD - in this light will need to give her low dose - please prescribe her the short acting Augmentin, 500mg  twice daily, for one week. If no improvement please ask her to call back. Thanks

## 2017-05-19 NOTE — Telephone Encounter (Signed)
Patient did state it feels like prior diverticulitis attack. She understands to call back to the office if not better. Rx sent to her pharmacy.

## 2017-05-26 DIAGNOSIS — N3091 Cystitis, unspecified with hematuria: Secondary | ICD-10-CM | POA: Diagnosis not present

## 2017-06-01 ENCOUNTER — Encounter: Payer: Self-pay | Admitting: Internal Medicine

## 2017-06-01 ENCOUNTER — Ambulatory Visit: Payer: Medicare Other | Admitting: Internal Medicine

## 2017-06-01 DIAGNOSIS — R55 Syncope and collapse: Secondary | ICD-10-CM | POA: Diagnosis not present

## 2017-06-01 LAB — CUP PACEART INCLINIC DEVICE CHECK
Battery Remaining Longevity: 139 mo
Battery Voltage: 3.02 V
Brady Statistic RA Percent Paced: 15 %
Date Time Interrogation Session: 20190128121546
Implantable Lead Implant Date: 20181022
Implantable Lead Location: 753860
Lead Channel Pacing Threshold Amplitude: 0.75 V
Lead Channel Pacing Threshold Amplitude: 0.75 V
Lead Channel Pacing Threshold Pulse Width: 0.5 ms
Lead Channel Pacing Threshold Pulse Width: 0.5 ms
Lead Channel Sensing Intrinsic Amplitude: 5 mV
MDC IDC LEAD IMPLANT DT: 20181022
MDC IDC LEAD LOCATION: 753859
MDC IDC MSMT LEADCHNL RA IMPEDANCE VALUE: 437.5 Ohm
MDC IDC MSMT LEADCHNL RA PACING THRESHOLD PULSEWIDTH: 0.5 ms
MDC IDC MSMT LEADCHNL RV IMPEDANCE VALUE: 700 Ohm
MDC IDC MSMT LEADCHNL RV PACING THRESHOLD AMPLITUDE: 0.5 V
MDC IDC MSMT LEADCHNL RV PACING THRESHOLD AMPLITUDE: 0.5 V
MDC IDC MSMT LEADCHNL RV PACING THRESHOLD PULSEWIDTH: 0.5 ms
MDC IDC MSMT LEADCHNL RV SENSING INTR AMPL: 10.2 mV
MDC IDC PG IMPLANT DT: 20181022
MDC IDC PG SERIAL: 8958692
MDC IDC SET LEADCHNL RA PACING AMPLITUDE: 2 V
MDC IDC SET LEADCHNL RV PACING AMPLITUDE: 0.625
MDC IDC SET LEADCHNL RV PACING PULSEWIDTH: 0.5 ms
MDC IDC SET LEADCHNL RV SENSING SENSITIVITY: 2 mV
MDC IDC STAT BRADY RV PERCENT PACED: 0.05 %

## 2017-06-01 NOTE — Patient Instructions (Addendum)
Medication Instructions:  Your physician recommends that you continue on your current medications as directed. Please refer to the Current Medication list given to you today.  Labwork: None ordered.  Testing/Procedures: None ordered.  Follow-Up:  Your physician recommends that you schedule a follow-up appointment in: 9 months with Tommye Standard, PA. Please call to schedule the office.  Remote monitoring is used to monitor your Pacemaker rom home. This monitoring reduces the number of office visits required to check your device to one time per year. It allows [place] to keep an eye on the functioning of your device to ensure it is working properly. You are scheduled for a device check from home on 08/31/2017. You may send your transmission at any time that day. If you have a wireless device, the transmission will be sent automatically. After your physician reviews your transmission, you will receive a postcard with your next transmission date.     Any Other Special Instructions Will Be Listed Below (If Applicable).     If you need a refill on your cardiac medications before your next appointment, please call your pharmacy.

## 2017-06-01 NOTE — Progress Notes (Signed)
Patient Care Team: Renato Shin, MD as PCP - General (Endocrinology) Love, Alyson Locket, MD (Neurology) Larey Dresser, MD as Consulting Physician (Cardiology) Felicity Pellegrini Tonna Corner, MD (Nephrology) Kathrynn Ducking, MD as Consulting Physician (Neurology) Dahlia Byes (Ophthalmology)   HPI  Katrina Yang is a 77 y.o. female Seen in follow-up for pacemaker implanted 10/18 for syncope abrupt in onset and offset in the context of underlying bifascicular block.  She also has a history of cryptogenic stroke with discussions for loop recorder implantation.     No subsequent syncope.  Major limitation is related to her knee.  Denies chest pain and sob   DATE TEST    8/18 Echo    EF 55 % LA normal  11/18  Echo    EF 60-65 %  LA (37/2 0.4/25)         Date Cr TSH  10/18 3.22 1.87          Records and Results Reviewed   Past Medical History:  Diagnosis Date  . Carotid bruit    a.  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  . Cervical spondylosis without myelopathy 02/01/2015  . CKD (chronic kidney disease), stage IV ([place])   . Colonic polyp    hx of  . CVA (cerebral vascular accident) ([place])    hx of  . Degenerative joint disease   . Diverticulosis of colon (without mention of hemorrhage)   . History of hysterectomy   . Hypertension    a. Renal artery dopplers in 2012 with no evidence for significant renal artery stenosis.   . Occipital neuralgia of left side 08/30/2013  . Osteoporosis, unspecified   . Palpitations    a. Event monitor (8/14) with no significant abnormalities.   . Peripheral neuropathic pain   . Personal history of unspecified urinary disorder   . Pulmonary nodule   . Pure hypercholesterolemia   . Secondary hyperparathyroidism (of renal origin)   . Unspecified hypothyroidism     Past Surgical History:  Procedure Laterality Date  . ABDOMINAL HYSTERECTOMY    . BACK SURGERY     diskectomy L-5. Multiple back surgeries  . MENISCECTOMY Left  11/14/15  . PACEMAKER IMPLANT N/A 02/23/2017   Procedure: PACEMAKER IMPLANT;  Surgeon: Evans Lance, MD;  Location: [facility];  Service: Cardiovascular;  Laterality: N/A;    Current Outpatient Medications  Medication Sig Dispense Refill  . amLODipine (NORVASC) 5 MG tablet Take 5 mg by mouth 2 (two) times daily.    Marland Kitchen aspirin EC 81 MG tablet Take 81 mg by mouth daily.    . calcitRIOL (ROCALTROL) 0.25 MCG capsule TAKE 1 CAPSULE (0.25 MCG TOTAL) BY MOUTH DAILY. 30 capsule 2  . clopidogrel (PLAVIX) 75 MG tablet TAKE 1 TABLET (75 MG TOTAL) BY MOUTH DAILY. 90 tablet 1  . gabapentin (NEURONTIN) 100 MG capsule TAKE 1 CAPSULE (100 MG TOTAL) BY MOUTH DAILY. 90 capsule 3  . levothyroxine (SYNTHROID, LEVOTHROID) 75 MCG tablet Take 1 tablet (75 mcg total) by mouth daily. 90 tablet 3  . metoprolol tartrate (LOPRESSOR) 25 MG tablet TAKE 1 TABLET (25 MG TOTAL) BY MOUTH 2 (TWO) TIMES DAILY. 180 tablet 3  . rosuvastatin (CRESTOR) 10 MG tablet Take 5 mg by mouth daily.     No current facility-administered medications for this visit.     Allergies  Allergen Reactions  . Iodinated Diagnostic Agents Swelling and Anaphylaxis    Throat swelling  . Metronidazole Nausea And  Vomiting  . Latex Rash  . Sulfonamide Derivatives     Unknown allergic reaction  . Codeine Rash and Nausea And Vomiting  . Lovastatin Nausea Only    Unknown reaction  . Sulfa Antibiotics Rash  . Sulfamethoxazole-Trimethoprim Nausea Only      Review of Systems negative except from HPI and PMH  Physical Exam BP 124/78   Pulse 60   Ht 5\' 2"  (1.575 m)   Wt 115 lb (52.2 kg)   BMI 21.03 kg/m  Well developed and nourished in no acute distress HENT normal Neck supple with JVP-flat Clear Device pocket well healed; without hematoma or erythema.  There is no tethering  Regular rate and rhythm, no murmurs or gallops Abd-soft with active BS No Clubbing cyanosis edema Skin-warm and dry A & Oriented  Grossly normal sensory  and motor function   ECG demonstrates atrial pacing at 60 Intervals 18/15/44 Right bundle branch block  Assessment and  Plan  Syncope  Bifascicular block  Cryptogenic stroke  Renal insufficiency grade 4  Hypothyroidism-treated  Pacemaker-[employer]    The patient's device function is normal.  She has had no interval atrial fibrillation.  There is been no interval syncope.  I will defer to primary care reassessment of her TSH is urgent and abrupt change in October-November worry about her becoming hypothyroid.      Current medicines are reviewed at length with the patient today .  The patient does not  have concerns regarding medicines.

## 2017-06-29 ENCOUNTER — Telehealth: Payer: Self-pay | Admitting: Neurology

## 2017-06-29 ENCOUNTER — Encounter: Payer: Self-pay | Admitting: Cardiovascular Disease

## 2017-06-29 ENCOUNTER — Ambulatory Visit: Payer: Medicare Other | Admitting: Cardiovascular Disease

## 2017-06-29 VITALS — BP 158/90 | HR 83 | Ht 62.0 in | Wt 117.8 lb

## 2017-06-29 DIAGNOSIS — I10 Essential (primary) hypertension: Secondary | ICD-10-CM

## 2017-06-29 DIAGNOSIS — I739 Peripheral vascular disease, unspecified: Principal | ICD-10-CM

## 2017-06-29 DIAGNOSIS — I779 Disorder of arteries and arterioles, unspecified: Secondary | ICD-10-CM | POA: Diagnosis not present

## 2017-06-29 NOTE — Telephone Encounter (Signed)
Pt called stating she fell back in October causing her heart to stop and had to get a pacemaker and knocked out her front teeth, ever since her head has been hurting on in off. Pt said she has had a CT scan. Also said she has had terrible neck pain if she moves a certain way she sends a shock up her neck. Pt is requesting a call back to discuss further and try to get an appt sooner than her 09/10/17

## 2017-06-29 NOTE — Progress Notes (Signed)
Cardiology Office Note Date:  06/29/2017   ID:  Katrina Yang, DOB [birth year], MRN [id number]  PCP:  Renato Shin, MD  Cardiologist:  Sherren Mocha, MD    Chief Complaint  Patient presents with  . Headache     History of Present Illness: Katrina Yang is a 77 y.o. female who presents for follow-up evaluation.  The patient has been followed in our practice for hypertension, hyperlipidemia, and bifascicular block.  She had a syncopal episode in the fall and had a bad injury.  She underwent permanent pacemaker placement in the context of bifascicular block and frank syncope.  She is here with her husband today. This is her first visit with me. Follows with Dr Caryl Comes and previously saw Dr Aundra Dubin. I see the patient's husband.   She continues to have some headache around the left frontotemporal area which is where she sustained the injury from her fall.  She is scheduled to see neurology on Wednesday.  Her blood pressure has been elevated intermittently.  She denies chest pain, chest pressure, or shortness of breath.  She denies leg swelling, orthopnea, PND, or heart palpitations.   Past Medical History:  Diagnosis Date  . Carotid bruit    a.  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  . Cervical spondylosis without myelopathy 02/01/2015  . CKD (chronic kidney disease), stage IV ([place])   . Colonic polyp    hx of  . CVA (cerebral vascular accident) ([place])    hx of  . Degenerative joint disease   . Diverticulosis of colon (without mention of hemorrhage)   . History of hysterectomy   . Hypertension    a. Renal artery dopplers in 2012 with no evidence for significant renal artery stenosis.   . Occipital neuralgia of left side 08/30/2013  . Osteoporosis, unspecified   . Palpitations    a. Event monitor (8/14) with no significant abnormalities.   . Peripheral neuropathic pain   . Personal history of unspecified urinary disorder   . Pulmonary nodule   . Pure hypercholesterolemia   .  Secondary hyperparathyroidism (of renal origin)   . Unspecified hypothyroidism     Past Surgical History:  Procedure Laterality Date  . ABDOMINAL HYSTERECTOMY    . BACK SURGERY     diskectomy L-5. Multiple back surgeries  . MENISCECTOMY Left 11/14/15  . PACEMAKER IMPLANT N/A 02/23/2017   Procedure: PACEMAKER IMPLANT;  Surgeon: Evans Lance, MD;  Location: [facility];  Service: Cardiovascular;  Laterality: N/A;    Current Outpatient Medications  Medication Sig Dispense Refill  . amLODipine (NORVASC) 5 MG tablet Take 5 mg by mouth 2 (two) times daily.    Marland Kitchen aspirin EC 81 MG tablet Take 81 mg by mouth daily.    . calcitRIOL (ROCALTROL) 0.25 MCG capsule TAKE 1 CAPSULE (0.25 MCG TOTAL) BY MOUTH DAILY. 30 capsule 2  . clopidogrel (PLAVIX) 75 MG tablet TAKE 1 TABLET (75 MG TOTAL) BY MOUTH DAILY. 90 tablet 1  . gabapentin (NEURONTIN) 100 MG capsule TAKE 1 CAPSULE (100 MG TOTAL) BY MOUTH DAILY. 90 capsule 3  . levothyroxine (SYNTHROID, LEVOTHROID) 75 MCG tablet Take 1 tablet (75 mcg total) by mouth daily. 90 tablet 3  . metoprolol tartrate (LOPRESSOR) 25 MG tablet TAKE 1 TABLET (25 MG TOTAL) BY MOUTH 2 (TWO) TIMES DAILY. 180 tablet 3  . rosuvastatin (CRESTOR) 10 MG tablet Take 5 mg by mouth daily.     No current facility-administered medications for this visit.  Allergies:   Iodinated diagnostic agents; Metronidazole; Latex; Sulfonamide derivatives; Codeine; Lovastatin; Sulfa antibiotics; and Sulfamethoxazole-trimethoprim   Social History:  The patient  reports that  has never smoked. she has never used smokeless tobacco. She reports that she does not drink alcohol or use drugs.   Family History:  The patient's family history includes Colon cancer in her mother; Heart attack in her father; Heart disease in her father and mother; Heart failure in her mother; Kidney cancer in her father; Ovarian cancer in her mother.    ROS:  Please see the history of present illness.  Otherwise,  review of systems is positive for headache, easy bruising.  All other systems are reviewed and negative.    PHYSICAL EXAM: VS:  BP (!) 158/90   Pulse 83   Ht 5\' 2"  (1.575 m)   Wt 117 lb 12.8 oz (53.4 kg)   BMI 21.55 kg/m  , BMI Body mass index is 21.55 kg/m. GEN: Well nourished, well developed, in no acute distress  HEENT: normal  Neck: no JVD, no masses. No carotid bruits Cardiac: RRR without murmur or gallop      Respiratory:  clear to auscultation bilaterally, normal work of breathing GI: soft, nontender, nondistended, + BS MS: no deformity or atrophy  Ext: no pretibial edema, pedal pulses 2+= bilaterally Skin: warm and dry, no rash Neuro:  Strength and sensation are intact Psych: euthymic mood, full affect  EKG:  EKG is not ordered today.  Recent Labs: 02/24/2017: ALT 10; BUN 38; Creatinine, Ser 3.22; Hemoglobin 10.3; Platelets 294; Potassium 4.7; Sodium 141 04/03/2017: TSH 1.87   Lipid Panel     Component Value Date/Time   CHOL 216 (H) 04/03/2017 1029   TRIG 60.0 04/03/2017 1029   HDL 97.60 04/03/2017 1029   CHOLHDL 2 04/03/2017 1029   VLDL 12.0 04/03/2017 1029   LDLCALC 106 (H) 04/03/2017 1029   LDLDIRECT 107.3 02/14/2013 0843      Wt Readings from Last 3 Encounters:  06/29/17 117 lb 12.8 oz (53.4 kg)  06/01/17 115 lb (52.2 kg)  03/05/17 120 lb (54.4 kg)     Cardiac Studies Reviewed: 2D Echo 02-23-2017: Study Conclusions  - Left ventricle: The cavity size was normal. Wall thickness was   normal. Systolic function was normal. The estimated ejection   fraction was in the range of 60% to 65%. Wall motion was normal;   there were no regional wall motion abnormalities. Doppler   parameters are consistent with abnormal left ventricular   relaxation (grade 1 diastolic dysfunction). - Aortic valve: There was no stenosis. - Mitral valve: There was no significant regurgitation. - Right ventricle: The cavity size was normal. Systolic function   was normal. -  Pulmonary arteries: No complete TR doppler jet so unable to   estimate PA systolic pressure. - Inferior vena cava: The vessel was normal in size. The   respirophasic diameter changes were in the normal range (>= 50%),   consistent with normal central venous pressure.  Impressions:  - Normal LV size and systolic function, EF 02-63%. Normal RV size   and systolic function. No significant valvular abnormalities.  ASSESSMENT AND PLAN: 1.  Hypertension: Labile blood pressure in the past noted.  Patient has a headache today and I suspect her blood pressure is elevated because she is in some pain and not feeling well.  She will continue her current regimen.  This includes amlodipine and metoprolol tartrate  2.  Syncope: No recurrence since undergoing permanent pacemaker  placement in the context of bifascicular block and syncope.  3.  Mixed hyperlipidemia: She is treated with Crestor 5 mg daily  4.  History of stroke/TIA: She is treated with antiplatelet therapy using aspirin and clopidogrel under the care of neurology.  Current medicines are reviewed with the patient today.  The patient does not have concerns regarding medicines.  Labs/ tests ordered today include:  No orders of the defined types were placed in this encounter.   Disposition:   FU one year  Signed, Sherren Mocha, MD  06/29/2017 [facility] [place], [place], [place]  [address] Phone: [phone]; Fax: [phone]

## 2017-06-29 NOTE — Patient Instructions (Signed)

## 2017-06-29 NOTE — Telephone Encounter (Signed)
Called pt. Scheduled appt for 07/01/17 at 4pm, check in 330pm. She verbalized understanding and appreciation for call

## 2017-06-29 NOTE — Telephone Encounter (Signed)
Dr. Jannifer Franklin- are you okay with working in her sooner for an appointment?

## 2017-06-29 NOTE — Telephone Encounter (Signed)
The patient has a history of occipital neuralgia, okay for revisit, she may benefit from a nerve block procedure.

## 2017-07-01 ENCOUNTER — Encounter: Payer: Self-pay | Admitting: Neurology

## 2017-07-01 ENCOUNTER — Ambulatory Visit: Payer: Medicare Other | Admitting: Neurology

## 2017-07-01 VITALS — BP 157/77 | HR 70 | Wt 119.0 lb

## 2017-07-01 DIAGNOSIS — G4489 Other headache syndrome: Secondary | ICD-10-CM

## 2017-07-01 DIAGNOSIS — M5481 Occipital neuralgia: Secondary | ICD-10-CM | POA: Diagnosis not present

## 2017-07-01 MED ORDER — GABAPENTIN 100 MG PO CAPS: 100.0000 mg | ORAL_CAPSULE | Freq: Two times a day (BID) | ORAL | 3 refills | Status: DC

## 2017-07-01 NOTE — Progress Notes (Signed)
Reason for visit: Headache, neck pain  Katrina Yang is an 77 y.o. female  History of present illness:  Katrina Yang is a 77 year old right-handed white female with a history of a fall that occurred in October 2018.  The patient has had some increase in headaches in the left frontotemporal area since that time, she believes the headaches have particularly worsened over the last 1 week.  She has soreness over the left brow.  She has ongoing pain in the neck area with occasional shock sensations in the back of the head.  The patient has a history of left occipital neuralgia.  She has some soreness into the shoulders, no pain down the arms.  She has not noted any significant change in balance, she does have significant chronic renal insufficiency and she is only on 100 mg daily of gabapentin.  She returns to the office today for further evaluation of the above discomfort.  Past Medical History:  Diagnosis Date  . Carotid bruit    a.  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  . Cervical spondylosis without myelopathy 02/01/2015  . CKD (chronic kidney disease), stage IV ([place])   . Colonic polyp    hx of  . CVA (cerebral vascular accident) ([place])    hx of  . Degenerative joint disease   . Diverticulosis of colon (without mention of hemorrhage)   . History of hysterectomy   . Hypertension    a. Renal artery dopplers in 2012 with no evidence for significant renal artery stenosis.   . Occipital neuralgia of left side 08/30/2013  . Osteoporosis, unspecified   . Palpitations    a. Event monitor (8/14) with no significant abnormalities.   . Peripheral neuropathic pain   . Personal history of unspecified urinary disorder   . Pulmonary nodule   . Pure hypercholesterolemia   . Secondary hyperparathyroidism (of renal origin)   . Unspecified hypothyroidism     Past Surgical History:  Procedure Laterality Date  . ABDOMINAL HYSTERECTOMY    . BACK SURGERY     diskectomy L-5. Multiple back  surgeries  . MENISCECTOMY Left 11/14/15  . PACEMAKER IMPLANT N/A 02/23/2017   Procedure: PACEMAKER IMPLANT;  Surgeon: Evans Lance, MD;  Location: [facility];  Service: Cardiovascular;  Laterality: N/A;    Family History  Problem Relation Age of Onset  . Kidney cancer Father   . Heart attack Father   . Heart disease Father   . Colon cancer Mother   . Ovarian cancer Mother   . Heart disease Mother   . Heart failure Mother   . Stomach cancer Neg Hx     Social history:  reports that  has never smoked. she has never used smokeless tobacco. She reports that she does not drink alcohol or use drugs.    Allergies  Allergen Reactions  . Iodinated Diagnostic Agents Swelling and Anaphylaxis    Throat swelling  . Metronidazole Nausea And Vomiting  . Latex Rash  . Sulfonamide Derivatives     Unknown allergic reaction  . Codeine Rash and Nausea And Vomiting  . Lovastatin Nausea Only    Unknown reaction  . Sulfa Antibiotics Rash  . Sulfamethoxazole-Trimethoprim Nausea Only    Medications:  Prior to Admission medications   Medication Sig Start Date End Date Taking? Authorizing Provider  amLODipine (NORVASC) 5 MG tablet Take 5 mg by mouth 2 (two) times daily.   Yes [provider]  aspirin EC 81 MG tablet Take  81 mg by mouth daily.   Yes [provider]  calcitRIOL (ROCALTROL) 0.25 MCG capsule TAKE 1 CAPSULE (0.25 MCG TOTAL) BY MOUTH DAILY. 04/21/17  Yes Renato Shin, MD  clopidogrel (PLAVIX) 75 MG tablet TAKE 1 TABLET (75 MG TOTAL) BY MOUTH DAILY. 01/15/17  Yes Kathrynn Ducking, MD  gabapentin (NEURONTIN) 100 MG capsule Take 1 capsule (100 mg total) by mouth 2 (two) times daily. 07/01/17  Yes Kathrynn Ducking, MD  levothyroxine (SYNTHROID, LEVOTHROID) 75 MCG tablet Take 1 tablet (75 mcg total) by mouth daily. 03/03/17  Yes Renato Shin, MD  metoprolol tartrate (LOPRESSOR) 25 MG tablet TAKE 1 TABLET (25 MG TOTAL) BY MOUTH 2 (TWO) TIMES DAILY. 09/22/16  Yes  Renato Shin, MD  rosuvastatin (CRESTOR) 10 MG tablet Take 5 mg by mouth daily.   Yes [provider]    ROS:  Out of a complete 14 system review of symptoms, the patient complains only of the following symptoms, and all other reviewed systems are negative.  Cold intolerance Joint pain, joint swelling, back pain, aching muscles, muscle cramps, walking difficulty, neck pain Bruising easily Headache, numbness, weakness  Blood pressure (!) 157/77, pulse 70, weight 119 lb (54 kg).  Physical Exam  General: The patient is alert and cooperative at the time of the examination.  Neuromuscular: The patient lacks about 15 degrees of full lateral rotation of the cervical spine bilaterally.  Skin: No significant peripheral edema is noted.   Neurologic Exam  Mental status: The patient is alert and oriented x 3 at the time of the examination. The patient has apparent normal recent and remote memory, with an apparently normal attention span and concentration ability.   Cranial nerves: Facial symmetry is present. Speech is normal, no aphasia or dysarthria is noted. Extraocular movements are full. Visual fields are full.  Motor: The patient has good strength in all 4 extremities.  Sensory examination: Soft touch sensation is symmetric on the face, arms, and legs.  Coordination: The patient has good finger-nose-finger and heel-to-shin bilaterally.  Gait and station: The patient has a slightly wide-based gait, the patient can walk independently. Romberg is negative. No drift is seen.  Reflexes: Deep tendon reflexes are symmetric.   Assessment/Plan:  1.  Cervical spondylosis, chronic neck pain  2.  Left occipital neuralgia  3.  Left frontotemporal headache  The patient has had worsening headaches since she fell in October 2018.  The patient will be sent back for CT scan evaluation of the brain.  She will increase the gabapentin taking 100 mg twice daily, if desired she may go  up to 3 a day.  I have recommended physical therapy for the neck and shoulders, the patient does not wish to pursue this therapy at this time.  She will otherwise follow-up in about 5 months.  Jill Alexanders MD 07/01/2017 4:19 PM  [facility] [address] [place], [place] [address]  Phone [phone] Fax [phone]

## 2017-07-01 NOTE — Patient Instructions (Signed)
   We will get a CT of the brain. We will go up on the gabapentin to 100 mg twice a day.  Neurontin (gabapentin) may result in drowsiness, ankle swelling, gait instability, or possibly dizziness. Please contact our office if significant side effects occur with this medication.

## 2017-07-08 ENCOUNTER — Other Ambulatory Visit: Payer: Self-pay

## 2017-07-08 MED ORDER — CALCITRIOL 0.25 MCG PO CAPS: 0.2500 ug | ORAL_CAPSULE | Freq: Every day | ORAL | 2 refills | Status: DC

## 2017-07-09 ENCOUNTER — Ambulatory Visit
Admission: RE | Admit: 2017-07-09 | Discharge: 2017-07-09 | Disposition: A | Discharge status: Home / Self Care | Payer: Medicare Other | Source: Ambulatory Visit | Attending: Neurology | Admitting: Neurology

## 2017-07-09 DIAGNOSIS — G4489 Other headache syndrome: Secondary | ICD-10-CM

## 2017-07-10 ENCOUNTER — Other Ambulatory Visit: Payer: Self-pay | Admitting: Endocrinology

## 2017-07-10 ENCOUNTER — Telehealth: Payer: Self-pay | Admitting: Neurology

## 2017-07-10 ENCOUNTER — Other Ambulatory Visit: Payer: Self-pay | Admitting: Neurology

## 2017-07-10 NOTE — Telephone Encounter (Signed)
I called the patient.  The CT of the head is unremarkable, no evidence of subdural following the fall.  Nothing that would increase the headache frequency.   CT head 07/10/17:  IMPRESSION:  This CT scan of the head without contrast shows the following: 1.    Age appropriate mild generalized cortical atrophy and mild chronic microvascular ischemic changes, stable when compared to the previous CT scan. 2.    There are no acute findings.

## 2017-07-16 ENCOUNTER — Other Ambulatory Visit: Payer: Self-pay

## 2017-08-11 DIAGNOSIS — J302 Other seasonal allergic rhinitis: Secondary | ICD-10-CM | POA: Diagnosis not present

## 2017-08-17 ENCOUNTER — Other Ambulatory Visit: Payer: Self-pay | Admitting: Endocrinology

## 2017-08-17 NOTE — Telephone Encounter (Signed)
Last ov 03/03/17 no upcoming please advise on refill

## 2017-08-17 NOTE — Telephone Encounter (Signed)
Please refill PRN 

## 2017-08-31 ENCOUNTER — Ambulatory Visit
Admission: RE | Admit: 2017-08-31 | Discharge: 2017-08-31 | Disposition: A | Discharge status: Home / Self Care | Payer: Medicare Other | Source: Ambulatory Visit | Attending: Endocrinology | Admitting: Endocrinology

## 2017-08-31 ENCOUNTER — Ambulatory Visit: Payer: Medicare Other | Admitting: Endocrinology

## 2017-08-31 ENCOUNTER — Encounter: Payer: Self-pay | Admitting: Endocrinology

## 2017-08-31 ENCOUNTER — Ambulatory Visit (INDEPENDENT_AMBULATORY_CARE_PROVIDER_SITE_OTHER): Payer: Medicare Other | Admitting: *Deleted

## 2017-08-31 VITALS — BP 120/72 | HR 81 | Temp 98.6°F | Resp 16 | Wt 116.2 lb

## 2017-08-31 DIAGNOSIS — R55 Syncope and collapse: Secondary | ICD-10-CM | POA: Diagnosis not present

## 2017-08-31 DIAGNOSIS — R059 Cough, unspecified: Secondary | ICD-10-CM

## 2017-08-31 DIAGNOSIS — R05 Cough: Secondary | ICD-10-CM

## 2017-08-31 MED ORDER — CEPHALEXIN 500 MG PO CAPS: 500.0000 mg | ORAL_CAPSULE | Freq: Three times a day (TID) | ORAL | 0 refills | Status: DC

## 2017-08-31 MED ORDER — TERBINAFINE HCL 250 MG PO TABS: 250.0000 mg | ORAL_TABLET | Freq: Every day | ORAL | 0 refills | Status: DC

## 2017-08-31 MED ORDER — FLUTICASONE-SALMETEROL 100-50 MCG/DOSE IN AEPB: 1.0000 | INHALATION_SPRAY | Freq: Two times a day (BID) | RESPIRATORY_TRACT | 0 refills | Status: DC

## 2017-08-31 NOTE — Patient Instructions (Addendum)
I have sent 3 prescriptions to your pharmacy: antibiotic, inhaler, and against nail fungus.  A chest x-ray is requested for you today.  We'll let you know about the results. I hope you feel better soon.  If you don't feel better by next week, please call back.  Please call sooner if you get worse.

## 2017-08-31 NOTE — Progress Notes (Signed)
Subjective:    Patient ID: Katrina Yang, female    DOB: [birth year], 77 y.o.   MRN: [id number]  HPI 5 days of moderate pain at the throat, and assoc nasal congestion.    Past Medical History:  Diagnosis Date  . Carotid bruit    a.  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  . Cervical spondylosis without myelopathy 02/01/2015  . CKD (chronic kidney disease), stage IV ([place])   . Colonic polyp    hx of  . CVA (cerebral vascular accident) ([place])    hx of  . Degenerative joint disease   . Diverticulosis of colon (without mention of hemorrhage)   . History of hysterectomy   . Hypertension    a. Renal artery dopplers in 2012 with no evidence for significant renal artery stenosis.   . Occipital neuralgia of left side 08/30/2013  . Osteoporosis, unspecified   . Palpitations    a. Event monitor (8/14) with no significant abnormalities.   . Peripheral neuropathic pain   . Personal history of unspecified urinary disorder   . Pulmonary nodule   . Pure hypercholesterolemia   . Secondary hyperparathyroidism (of renal origin)   . Unspecified hypothyroidism     Past Surgical History:  Procedure Laterality Date  . ABDOMINAL HYSTERECTOMY    . BACK SURGERY     diskectomy L-5. Multiple back surgeries  . MENISCECTOMY Left 11/14/15  . PACEMAKER IMPLANT N/A 02/23/2017   Procedure: PACEMAKER IMPLANT;  Surgeon: Evans Lance, MD;  Location: [facility];  Service: Cardiovascular;  Laterality: N/A;    Social History   Socioeconomic History  . Marital status: Married    Spouse name: [name]  . Number of children: 1  . Years of education: 2  . Highest education level: Not on file  Occupational History  . 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 Financial resource strain: Not on file  . Food insecurity:    Worry: Not on file    Inability: Not on file  . Transportation needs:    Medical: Not on file    Non-medical: Not on file    Tobacco Use  . Smoking status: Never Smoker  . Smokeless tobacco: Never Used  Substance and Sexual Activity  . Alcohol use: No  . Drug use: No  . Sexual activity: Not on file  Lifestyle  . Physical activity:    Days per week: Not on file    Minutes per session: Not on file  . Stress: Not on file  Relationships  . Social connections:    Talks on phone: Not on file    Gets together: Not on file    Attends religious service: Not on file    Active member of club or organization: Not on file    Attends meetings of clubs or organizations: Not on file    Relationship status: Not on file  . Intimate partner violence:    Fear of current or ex partner: Not on file    Emotionally abused: Not on file    Physically abused: Not on file    Forced sexual activity: Not on file  Other Topics Concern  . Not on file  Social History Narrative   Pt lives at home with her spouse.   Patient drinks 1 glass of tea every other day.   Patient is right handed.    Current Outpatient Medications on File Prior to  Visit  Medication Sig Dispense Refill  . amLODipine (NORVASC) 5 MG tablet Take 5 mg by mouth 2 (two) times daily.    Marland Kitchen aspirin EC 81 MG tablet Take 81 mg by mouth daily.    . calcitRIOL (ROCALTROL) 0.25 MCG capsule Take 1 capsule (0.25 mcg total) by mouth daily. 90 capsule 2  . clopidogrel (PLAVIX) 75 MG tablet TAKE 1 TABLET (75 MG TOTAL) BY MOUTH DAILY. 90 tablet 1  . gabapentin (NEURONTIN) 100 MG capsule Take 1 capsule (100 mg total) by mouth 2 (two) times daily. 180 capsule 3  . levothyroxine (SYNTHROID, LEVOTHROID) 75 MCG tablet Take 1 tablet (75 mcg total) by mouth daily. 90 tablet 3  . levothyroxine (SYNTHROID, LEVOTHROID) 88 MCG tablet TAKE 1 TABLET (88 MCG TOTAL) BY MOUTH DAILY BEFORE BREAKFAST. 90 tablet 3  . metoprolol tartrate (LOPRESSOR) 25 MG tablet TAKE 1 TABLET (25 MG TOTAL) BY MOUTH 2 (TWO) TIMES DAILY. 180 tablet 3  . rosuvastatin (CRESTOR) 10 MG tablet TAKE 1/2 TABLET EVERY  DAY 90 tablet 1   No current facility-administered medications on file prior to visit.     Allergies  Allergen Reactions  . Iodinated Diagnostic Agents Swelling and Anaphylaxis    Throat swelling  . Metronidazole Nausea And Vomiting  . Latex Rash  . Sulfonamide Derivatives     Unknown allergic reaction  . Codeine Rash and Nausea And Vomiting  . Lovastatin Nausea Only    Unknown reaction  . Sulfa Antibiotics Rash  . Sulfamethoxazole-Trimethoprim Nausea Only    Family History  Problem Relation Age of Onset  . Kidney cancer Father   . Heart attack Father   . Heart disease Father   . Colon cancer Mother   . Ovarian cancer Mother   . Heart disease Mother   . Heart failure Mother   . Stomach cancer Neg Hx     BP 120/72   Pulse 81   Temp 98.6 F (37 C)   Resp 16   Wt 116 lb 3.2 oz (52.7 kg)   SpO2 96%   BMI 21.25 kg/m    Review of Systems Denies fever, sob, and n/v.  Earache is improved.     Objective:   Physical Exam VITAL SIGNS:  See vs page GENERAL: no distress head: no deformity  eyes: no periorbital swelling, no proptosis  external nose and ears are normal  mouth: no lesion seen Both eac's and tm's are normal LUNGS:  Clear to auscultation, except for rales at both bases and exp wheezes.  Right middle finger: onychomycosis is noted     Assessment & Plan:  Cough, new, with rales. Onychomycosis, new   Patient Instructions  I have sent 3 prescriptions to your pharmacy: antibiotic, inhaler, and against nail fungus.  A chest x-ray is requested for you today.  We'll let you know about the results. I hope you feel better soon.  If you don't feel better by next week, please call back.  Please call sooner if you get worse.

## 2017-08-31 NOTE — Progress Notes (Signed)
Remote pacemaker transmission.   

## 2017-09-01 ENCOUNTER — Encounter: Payer: Self-pay | Admitting: Cardiology

## 2017-09-03 ENCOUNTER — Ambulatory Visit: Payer: Medicare Other | Admitting: Neurology

## 2017-09-10 ENCOUNTER — Ambulatory Visit: Payer: Medicare Other | Admitting: Neurology

## 2017-09-12 ENCOUNTER — Other Ambulatory Visit: Payer: Self-pay | Admitting: Endocrinology

## 2017-09-17 ENCOUNTER — Other Ambulatory Visit: Payer: Self-pay

## 2017-09-17 DIAGNOSIS — N184 Chronic kidney disease, stage 4 (severe): Secondary | ICD-10-CM

## 2017-09-17 DIAGNOSIS — Z01812 Encounter for preprocedural laboratory examination: Secondary | ICD-10-CM

## 2017-09-18 ENCOUNTER — Encounter (HOSPITAL_COMMUNITY): Payer: Medicare Other

## 2017-09-18 ENCOUNTER — Other Ambulatory Visit (HOSPITAL_COMMUNITY): Payer: Medicare Other

## 2017-09-18 ENCOUNTER — Encounter: Payer: Medicare Other | Admitting: Surgery

## 2017-09-18 LAB — CUP PACEART REMOTE DEVICE CHECK
Battery Remaining Percentage: 95.5 %
Brady Statistic AP VS Percent: 12 %
Brady Statistic AS VP Percent: 1 %
Brady Statistic AS VS Percent: 88 %
Brady Statistic RV Percent Paced: 1 %
Implantable Lead Implant Date: 20181022
Implantable Lead Location: 753860
Lead Channel Impedance Value: 440 Ohm
Lead Channel Pacing Threshold Amplitude: 0.375 V
Lead Channel Pacing Threshold Amplitude: 0.75 V
Lead Channel Pacing Threshold Pulse Width: 0.5 ms
Lead Channel Pacing Threshold Pulse Width: 0.5 ms
Lead Channel Sensing Intrinsic Amplitude: 12 mV
Lead Channel Setting Pacing Amplitude: 0.625
Lead Channel Setting Sensing Sensitivity: 2 mV
MDC IDC LEAD IMPLANT DT: 20181022
MDC IDC LEAD LOCATION: 753859
MDC IDC MSMT BATTERY REMAINING LONGEVITY: 130 mo
MDC IDC MSMT BATTERY VOLTAGE: 3.04 V
MDC IDC MSMT LEADCHNL RA SENSING INTR AMPL: 5 mV
MDC IDC MSMT LEADCHNL RV IMPEDANCE VALUE: 680 Ohm
MDC IDC PG IMPLANT DT: 20181022
MDC IDC SESS DTM: 20190429124701
MDC IDC SET LEADCHNL RA PACING AMPLITUDE: 2 V
MDC IDC SET LEADCHNL RV PACING PULSEWIDTH: 0.5 ms
MDC IDC STAT BRADY AP VP PERCENT: 1 %
MDC IDC STAT BRADY RA PERCENT PACED: 12 %
Pulse Gen Serial Number: 8958692

## 2017-09-22 ENCOUNTER — Other Ambulatory Visit (HOSPITAL_COMMUNITY): Payer: Self-pay | Admitting: *Deleted

## 2017-09-23 ENCOUNTER — Encounter: Payer: Self-pay | Admitting: Endocrinology

## 2017-09-23 ENCOUNTER — Ambulatory Visit: Payer: Medicare Other | Admitting: Endocrinology

## 2017-09-23 ENCOUNTER — Ambulatory Visit (HOSPITAL_COMMUNITY)
Admission: RE | Admit: 2017-09-23 | Discharge: 2017-09-23 | Disposition: A | Discharge status: Home / Self Care | Payer: Medicare Other | Source: Ambulatory Visit | Attending: Nephrology | Admitting: Nephrology

## 2017-09-23 VITALS — BP 132/72 | HR 73 | Wt 117.8 lb

## 2017-09-23 DIAGNOSIS — D649 Anemia, unspecified: Secondary | ICD-10-CM | POA: Diagnosis not present

## 2017-09-23 DIAGNOSIS — R58 Hemorrhage, not elsewhere classified: Secondary | ICD-10-CM | POA: Diagnosis not present

## 2017-09-23 DIAGNOSIS — D631 Anemia in chronic kidney disease: Secondary | ICD-10-CM | POA: Insufficient documentation

## 2017-09-23 DIAGNOSIS — N189 Chronic kidney disease, unspecified: Secondary | ICD-10-CM | POA: Diagnosis present

## 2017-09-23 LAB — CBC WITH DIFFERENTIAL/PLATELET
BASOS ABS: 0 10*3/uL (ref 0.0–0.1)
Basophils Relative: 0.4 % (ref 0.0–3.0)
EOS PCT: 0.4 % (ref 0.0–5.0)
Eosinophils Absolute: 0 10*3/uL (ref 0.0–0.7)
HEMATOCRIT: 30.5 % — AB (ref 36.0–46.0)
HEMOGLOBIN: 10 g/dL — AB (ref 12.0–15.0)
LYMPHS PCT: 20.2 % (ref 12.0–46.0)
Lymphs Abs: 1.7 10*3/uL (ref 0.7–4.0)
MCHC: 32.8 g/dL (ref 30.0–36.0)
MCV: 90.8 fl (ref 78.0–100.0)
MONOS PCT: 9 % (ref 3.0–12.0)
Monocytes Absolute: 0.7 10*3/uL (ref 0.1–1.0)
Neutro Abs: 5.8 10*3/uL (ref 1.4–7.7)
Neutrophils Relative %: 70 % (ref 43.0–77.0)
Platelets: 283 10*3/uL (ref 150.0–400.0)
RBC: 3.36 Mil/uL — ABNORMAL LOW (ref 3.87–5.11)
RDW: 14.3 % (ref 11.5–15.5)
WBC: 8.3 10*3/uL (ref 4.0–10.5)

## 2017-09-23 LAB — IBC PANEL
Iron: 49 ug/dL (ref 42–145)
SATURATION RATIOS: 13.2 % — AB (ref 20.0–50.0)
Transferrin: 266 mg/dL (ref 212.0–360.0)

## 2017-09-23 MED ORDER — FERUMOXYTOL INJECTION 510 MG/17 ML
510.0000 mg | INTRAVENOUS | Status: DC
  Administered 2017-09-23: 510 mg via INTRAVENOUS
  Filled 2017-09-23: qty 17

## 2017-09-23 NOTE — Progress Notes (Signed)
Subjective:    Patient ID: Katrina Yang, female    DOB: 01-30-1941, 77 y.o.   MRN: [id number]  HPI Pt states few months of slight rash on the legs, but no assoc itching.  She says the lesions heal, only to be replaced with others.  She is starting aranesp.   Past Medical History:  Diagnosis Date  . Carotid bruit    a.  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  . Cervical spondylosis without myelopathy 02/01/2015  . CKD (chronic kidney disease), stage IV ([place])   . Colonic polyp    hx of  . CVA (cerebral vascular accident) ([place])    hx of  . Degenerative joint disease   . Diverticulosis of colon (without mention of hemorrhage)   . History of hysterectomy   . Hypertension    a. Renal artery dopplers in 2012 with no evidence for significant renal artery stenosis.   . Occipital neuralgia of left side 08/30/2013  . Osteoporosis, unspecified   . Palpitations    a. Event monitor (8/14) with no significant abnormalities.   . Peripheral neuropathic pain   . Personal history of unspecified urinary disorder   . Pulmonary nodule   . Pure hypercholesterolemia   . Secondary hyperparathyroidism (of renal origin)   . Unspecified hypothyroidism     Past Surgical History:  Procedure Laterality Date  . ABDOMINAL HYSTERECTOMY    . BACK SURGERY     diskectomy L-5. Multiple back surgeries  . MENISCECTOMY Left 11/14/15  . PACEMAKER IMPLANT N/A 02/23/2017   Procedure: PACEMAKER IMPLANT;  Surgeon: Evans Lance, MD;  Location: [facility];  Service: Cardiovascular;  Laterality: N/A;    Social History   Socioeconomic History  . Marital status: Married    Spouse name: [name]  . Number of children: 1  . Years of education: 2  . Highest education level: Not on file  Occupational History  . 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 Financial resource strain: Not on file  . Food insecurity:    Worry: Not on file    Inability:  Not on file  . Transportation needs:    Medical: Not on file    Non-medical: Not on file  Tobacco Use  . Smoking status: Never Smoker  . Smokeless tobacco: Never Used  Substance and Sexual Activity  . Alcohol use: No  . Drug use: No  . Sexual activity: Not on file  Lifestyle  . Physical activity:    Days per week: Not on file    Minutes per session: Not on file  . Stress: Not on file  Relationships  . Social connections:    Talks on phone: Not on file    Gets together: Not on file    Attends religious service: Not on file    Active member of club or organization: Not on file    Attends meetings of clubs or organizations: Not on file    Relationship status: Not on file  . Intimate partner violence:    Fear of current or ex partner: Not on file    Emotionally abused: Not on file    Physically abused: Not on file    Forced sexual activity: Not on file  Other Topics Concern  . Not on file  Social History Narrative   Pt lives at home with her spouse.   Patient drinks 1 glass of tea every other  day.   Patient is right handed.    Current Outpatient Medications on File Prior to Visit  Medication Sig Dispense Refill  . amLODipine (NORVASC) 5 MG tablet Take 5 mg by mouth 2 (two) times daily.    Marland Kitchen aspirin EC 81 MG tablet Take 81 mg by mouth daily.    . calcitRIOL (ROCALTROL) 0.25 MCG capsule Take 1 capsule (0.25 mcg total) by mouth daily. 90 capsule 2  . cephALEXin (KEFLEX) 500 MG capsule Take 1 capsule (500 mg total) by mouth 3 (three) times daily. 21 capsule 0  . clopidogrel (PLAVIX) 75 MG tablet TAKE 1 TABLET (75 MG TOTAL) BY MOUTH DAILY. 90 tablet 1  . Fluticasone-Salmeterol (ADVAIR DISKUS) 100-50 MCG/DOSE AEPB Inhale 1 puff into the lungs 2 (two) times daily. 1 each 0  . gabapentin (NEURONTIN) 100 MG capsule Take 1 capsule (100 mg total) by mouth 2 (two) times daily. 180 capsule 3  . levothyroxine (SYNTHROID, LEVOTHROID) 75 MCG tablet Take 1 tablet (75 mcg total) by mouth  daily. 90 tablet 3  . levothyroxine (SYNTHROID, LEVOTHROID) 88 MCG tablet TAKE 1 TABLET (88 MCG TOTAL) BY MOUTH DAILY BEFORE BREAKFAST. 90 tablet 3  . metoprolol tartrate (LOPRESSOR) 25 MG tablet TAKE 1 TABLET (25 MG TOTAL) BY MOUTH 2 (TWO) TIMES DAILY. 180 tablet 3  . rosuvastatin (CRESTOR) 10 MG tablet TAKE 1/2 TABLET EVERY DAY 90 tablet 1  . terbinafine (LAMISIL) 250 MG tablet Take 1 tablet (250 mg total) by mouth daily. 90 tablet 0   No current facility-administered medications on file prior to visit.     Allergies  Allergen Reactions  . Iodinated Diagnostic Agents Swelling and Anaphylaxis    Throat swelling  . Metronidazole Nausea And Vomiting  . Latex Rash  . Sulfonamide Derivatives     Unknown allergic reaction  . Codeine Rash and Nausea And Vomiting  . Lovastatin Nausea Only    Unknown reaction  . Sulfa Antibiotics Rash  . Sulfamethoxazole-Trimethoprim Nausea Only    Family History  Problem Relation Age of Onset  . Kidney cancer Father   . Heart attack Father   . Heart disease Father   . Colon cancer Mother   . Ovarian cancer Mother   . Heart disease Mother   . Heart failure Mother   . Stomach cancer Neg Hx     BP 132/72   Pulse 73   Wt 117 lb 12.8 oz (53.4 kg)   SpO2 96%   BMI 21.55 kg/m   Review of Systems Denies BRBPR and hematuria.      Objective:   Physical Exam VITAL SIGNS:  See vs page.  GENERAL: no distress.  Skin: on the legs, there is a mild deep erythematous non-blanching rash.      Assessment & Plan:  Skin lesions, new, uncertain etiology.  these are prob ecchymosis. Anemia, on aranesp.  Recheck today  Patient Instructions  blood tests are requested for you today.  We'll let you know about the results.   If this is normal, you should conclude these are harmless bruises under the skin.

## 2017-09-23 NOTE — Discharge Instructions (Signed)

## 2017-09-23 NOTE — Patient Instructions (Signed)
blood tests are requested for you today.  We'll let you know about the results.   If this is normal, you should conclude these are harmless bruises under the skin.

## 2017-09-30 ENCOUNTER — Ambulatory Visit (HOSPITAL_COMMUNITY)
Admission: RE | Admit: 2017-09-30 | Discharge: 2017-09-30 | Disposition: A | Discharge status: Home / Self Care | Payer: Medicare Other | Source: Ambulatory Visit | Attending: Nephrology | Admitting: Nephrology

## 2017-09-30 DIAGNOSIS — N189 Chronic kidney disease, unspecified: Secondary | ICD-10-CM | POA: Insufficient documentation

## 2017-09-30 DIAGNOSIS — D631 Anemia in chronic kidney disease: Secondary | ICD-10-CM | POA: Diagnosis present

## 2017-09-30 MED ORDER — SODIUM CHLORIDE 0.9 % IV SOLN
510.0000 mg | INTRAVENOUS | Status: DC
  Administered 2017-09-30: 510 mg via INTRAVENOUS
  Filled 2017-09-30: qty 17

## 2017-10-15 ENCOUNTER — Ambulatory Visit (HOSPITAL_COMMUNITY)
Admission: RE | Admit: 2017-10-15 | Discharge: 2017-10-15 | Disposition: A | Discharge status: Home / Self Care | Payer: Medicare Other | Source: Ambulatory Visit | Attending: Vascular Surgery | Admitting: Vascular Surgery

## 2017-10-15 DIAGNOSIS — Z01818 Encounter for other preprocedural examination: Secondary | ICD-10-CM | POA: Diagnosis present

## 2017-10-15 DIAGNOSIS — Z01812 Encounter for preprocedural laboratory examination: Secondary | ICD-10-CM | POA: Diagnosis present

## 2017-10-15 DIAGNOSIS — N184 Chronic kidney disease, stage 4 (severe): Secondary | ICD-10-CM | POA: Insufficient documentation

## 2017-10-21 ENCOUNTER — Other Ambulatory Visit: Payer: Self-pay

## 2017-10-21 ENCOUNTER — Encounter: Payer: Self-pay | Admitting: Vascular Surgery

## 2017-10-21 ENCOUNTER — Ambulatory Visit: Payer: Medicare Other | Admitting: Vascular Surgery

## 2017-10-21 VITALS — BP 142/76 | HR 60 | Temp 98.6°F | Resp 18 | Ht 61.5 in | Wt 117.0 lb

## 2017-10-21 DIAGNOSIS — N184 Chronic kidney disease, stage 4 (severe): Secondary | ICD-10-CM

## 2017-10-21 NOTE — Progress Notes (Signed)
Requested by:  Renato Shin, MD [address] [place], [place] [address]  Reason for consultation: New access   History of Present Illness   Katrina Yang is a 77 y.o. (29-Jun-1940) female who presents for evaluation for permanent access.  The patient is right hand dominant.  The patient has not had previous access procedures.  Previous central venous cannulation procedures include: none.  The patient has had a L SCV PPM placed.   Past Medical History:  Diagnosis Date  . Anemia   . Carotid bruit    a.  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  . Cervical spondylosis without myelopathy 02/01/2015  . CKD (chronic kidney disease), stage IV ([place])   . Colonic polyp    hx of  . CVA (cerebral vascular accident) ([place])    hx of  . Degenerative joint disease   . Diverticulosis of colon (without mention of hemorrhage)   . History of hysterectomy   . Hypertension    a. Renal artery dopplers in 2012 with no evidence for significant renal artery stenosis.   . Occipital neuralgia of left side 08/30/2013  . Osteoporosis, unspecified   . Palpitations    a. Event monitor (8/14) with no significant abnormalities.   . Peripheral neuropathic pain   . Personal history of unspecified urinary disorder   . Pulmonary nodule   . Pure hypercholesterolemia   . Secondary hyperparathyroidism (of renal origin)   . Unspecified hypothyroidism     Past Surgical History:  Procedure Laterality Date  . ABDOMINAL HYSTERECTOMY    . BACK SURGERY     diskectomy L-5. Multiple back surgeries  . MENISCECTOMY Left 11/14/15  . PACEMAKER IMPLANT N/A 02/23/2017   Procedure: PACEMAKER IMPLANT;  Surgeon: Evans Lance, MD;  Location: [facility];  Service: Cardiovascular;  Laterality: N/A;    Social History   Socioeconomic History  . Marital status: Married    Spouse name: [name]  . Number of children: 1  . Years of education: 2  . Highest education level: Not on file  Occupational History   . 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 Financial resource strain: Not on file  . Food insecurity:    Worry: Not on file    Inability: Not on file  . Transportation needs:    Medical: Not on file    Non-medical: Not on file  Tobacco Use  . Smoking status: Never Smoker  . Smokeless tobacco: Never Used  Substance and Sexual Activity  . Alcohol use: No  . Drug use: No  . Sexual activity: Not on file  Lifestyle  . Physical activity:    Days per week: Not on file    Minutes per session: Not on file  . Stress: Not on file  Relationships  . Social connections:    Talks on phone: Not on file    Gets together: Not on file    Attends religious service: Not on file    Active member of club or organization: Not on file    Attends meetings of clubs or organizations: Not on file    Relationship status: Not on file  . Intimate partner violence:    Fear of current or ex partner: Not on file    Emotionally abused: Not on file    Physically abused: Not on file    Forced sexual activity: Not on file  Other  Topics Concern  . Not on file  Social History Narrative   Pt lives at home with her spouse.   Patient drinks 1 glass of tea every other day.   Patient is right handed.    Family History  Problem Relation Age of Onset  . Kidney cancer Father   . Heart attack Father   . Heart disease Father   . Colon cancer Mother   . Ovarian cancer Mother   . Heart disease Mother   . Heart failure Mother   . Stomach cancer Neg Hx     Current Outpatient Medications  Medication Sig Dispense Refill  . amLODipine (NORVASC) 5 MG tablet Take 5 mg by mouth 2 (two) times daily.    Marland Kitchen aspirin EC 81 MG tablet Take 81 mg by mouth daily.    . calcitRIOL (ROCALTROL) 0.25 MCG capsule Take 1 capsule (0.25 mcg total) by mouth daily. 90 capsule 2  . clopidogrel (PLAVIX) 75 MG tablet TAKE 1 TABLET (75 MG TOTAL) BY MOUTH DAILY. 90 tablet 1  .  gabapentin (NEURONTIN) 100 MG capsule Take 1 capsule (100 mg total) by mouth 2 (two) times daily. 180 capsule 3  . levothyroxine (SYNTHROID, LEVOTHROID) 88 MCG tablet TAKE 1 TABLET (88 MCG TOTAL) BY MOUTH DAILY BEFORE BREAKFAST. 90 tablet 3  . metoprolol tartrate (LOPRESSOR) 25 MG tablet TAKE 1 TABLET (25 MG TOTAL) BY MOUTH 2 (TWO) TIMES DAILY. 180 tablet 3  . rosuvastatin (CRESTOR) 10 MG tablet TAKE 1/2 TABLET EVERY DAY 90 tablet 1  . cephALEXin (KEFLEX) 500 MG capsule Take 1 capsule (500 mg total) by mouth 3 (three) times daily. (Patient not taking: Reported on 10/21/2017) 21 capsule 0  . Fluticasone-Salmeterol (ADVAIR DISKUS) 100-50 MCG/DOSE AEPB Inhale 1 puff into the lungs 2 (two) times daily. (Patient not taking: Reported on 10/21/2017) 1 each 0  . levothyroxine (SYNTHROID, LEVOTHROID) 75 MCG tablet Take 1 tablet (75 mcg total) by mouth daily. (Patient not taking: Reported on 10/21/2017) 90 tablet 3  . terbinafine (LAMISIL) 250 MG tablet Take 1 tablet (250 mg total) by mouth daily. (Patient not taking: Reported on 10/21/2017) 90 tablet 0   No current facility-administered medications for this visit.     Allergies  Allergen Reactions  . Iodinated Diagnostic Agents Swelling and Anaphylaxis    Throat swelling  . Metronidazole Nausea And Vomiting  . Latex Rash  . Sulfonamide Derivatives     Unknown allergic reaction  . Codeine Rash and Nausea And Vomiting  . Lovastatin Nausea Only    Unknown reaction  . Sulfa Antibiotics Rash  . Sulfamethoxazole-Trimethoprim Nausea Only    REVIEW OF SYSTEMS (negative unless checked):   Cardiac:  []  Chest pain or chest pressure? []  Shortness of breath upon activity? []  Shortness of breath when lying flat? []  Irregular heart rhythm?  Vascular:  []  Pain in calf, thigh, or hip brought on by walking? []  Pain in feet at night that wakes you up from your sleep? []  Blood clot in your veins? []  Leg swelling?  Pulmonary:  []  Oxygen at home? []   Productive cough? []  Wheezing?  Neurologic:  []  Sudden weakness in arms or legs? []  Sudden numbness in arms or legs? []  Sudden onset of difficult speaking or slurred speech? []  Temporary loss of vision in one eye? []  Problems with dizziness?  Gastrointestinal:  []  Blood in stool? []  Vomited blood?  Genitourinary:  []  Burning when urinating? []  Blood in urine?  Psychiatric:  []  Major depression  Hematologic:  []   Bleeding problems? []  Problems with blood clotting?  Dermatologic:  []  Rashes or ulcers?  Constitutional:  []  Fever or chills?  Ear/Nose/Throat:  []  Change in hearing? []  Nose bleeds? []  Sore throat?  Musculoskeletal:  []  Back pain? []  Joint pain? []  Muscle pain?   Physical Examination     Vitals:   10/21/17 0957 10/21/17 1000  BP: (!) 152/74 (!) 142/76  Pulse: 60   Resp: 18   Temp: 98.6 F (37 C)   TempSrc: Oral   SpO2: 97%   Weight: 117 lb (53.1 kg)   Height: 5' 1.5" (1.562 m)    Body mass index is 21.75 kg/m.  General Alert, O x 3, WD, NAD  Head [place]/AT,    Ear/Nose/ Throat Hearing grossly intact, nares without erythema or drainage, oropharynx without Erythema or Exudate, Mallampati score: 3,   Eyes PERRLA, EOMI,    Neck Supple, mid-line trachea,    Pulmonary Sym exp, good B air movt, CTA B  Cardiac RRR, Nl S1, S2, no Murmurs, No rubs, No S3,S4  Vascular Vessel Right Left  Radial Palpable Palpable  Brachial Palpable Palpable  Carotid Palpable, No Bruit Palpable, No Bruit  Aorta Not palpable N/A  Femoral Palpable Palpable  Popliteal Not palpable Not palpable  PT Faintly palpable Faintly palpable  DP Palpable Palpable    Gastro- intestinal soft, non-distended, non-tender to palpation, No guarding or rebound, no HSM, no masses, no CVAT B, No palpable prominent aortic pulse,    Musculo- skeletal M/S 5/5 throughout  , Extremities without ischemic changes  , No edema present, No visible varicosities , No Lipodermatosclerosis present    Neurologic Cranial nerves 2-12 intact , Pain and light touch intact in extremities , Motor exam as listed above  Psychiatric Judgement intact, Mood & affect appropriate for pt's clinical situation  Dermatologic See M/S exam for extremity exam, No rashes otherwise noted  Lymphatic  Palpable lymph nodes: None     Non-invasive Vascular Imaging   BUE Vein Mapping  (Date: 10/21/2017):   R arm: acceptable vein conduits include marginal basilic vein  BUE Doppler (Date: 10/21/2017):   R arm:   Brachial: tri, 3.8 mm  Radial: tri, 2.4 mm  Ulnar: tri, 1.5 mm   Outside Studies/Documentation   3 pages of outside documents were reviewed including: outpatient nephrology charts.   Medical Decision Making   PHUONG HILLARY is a 77 y.o. female who presents with chronic kidney disease stage IV-V   Based on vein mapping and examination, this patient's permanent access options include: right staged BVT due to L SCV PPM.  The patient is aware that the risks of access surgery include but are not limited to: bleeding, infection, steal syndrome, nerve damage, ischemic monomelic neuropathy, failure of access to mature, complications related to venous hypertension, and possible need for additional access procedures in the future. I discussed with the patient the nature of the staged access procedure, specifically the need for a second operation to transpose the first stage fistula if it matures adequately.    The patient has NOT agreed to proceed with the above procedure.  Her husband is recovering from a recent stroke which will delay her procedure.  She will call us when she is ready to proceed.   Katrina Barthel, MD, FACS [facility] Office: [phone] Pager: [phone]  10/21/2017, 10:52 AM

## 2017-11-30 ENCOUNTER — Ambulatory Visit (INDEPENDENT_AMBULATORY_CARE_PROVIDER_SITE_OTHER): Payer: Medicare Other | Admitting: *Deleted

## 2017-11-30 DIAGNOSIS — R55 Syncope and collapse: Secondary | ICD-10-CM | POA: Diagnosis not present

## 2017-11-30 NOTE — Progress Notes (Signed)
Remote pacemaker transmission.   

## 2017-12-01 ENCOUNTER — Encounter: Payer: Self-pay | Admitting: Cardiology

## 2017-12-09 ENCOUNTER — Ambulatory Visit: Payer: Medicare Other | Admitting: Vascular Surgery

## 2017-12-09 ENCOUNTER — Encounter: Payer: Self-pay | Admitting: Vascular Surgery

## 2017-12-09 ENCOUNTER — Other Ambulatory Visit: Payer: Self-pay

## 2017-12-09 VITALS — BP 142/81 | HR 71 | Temp 97.5°F | Resp 14 | Ht 63.5 in | Wt 117.0 lb

## 2017-12-09 DIAGNOSIS — N184 Chronic kidney disease, stage 4 (severe): Secondary | ICD-10-CM

## 2017-12-09 NOTE — Progress Notes (Signed)
Established Dialysis Access   History of Present Illness   Katrina Yang is a 77 y.o. ([birth year]) female who presents for re-evaluation for permanent access.  The patient is right hand dominant.  The patient has known L SCV PPM.  The patient has been recommended: R staged BVT.  She has completed the pre-hemodialysis classroom course at this point.  She returns for further discussion.   The patient's PMH, PSH, SH, and FamHx were reviewed on 12/09/2017 are unchanged from 10/21/17.  Current Outpatient Medications  Medication Sig Dispense Refill  . amLODipine (NORVASC) 5 MG tablet Take 5 mg by mouth 2 (two) times daily.    Marland Kitchen amLODipine (NORVASC) 5 MG tablet TAKE 1 TABLET BY MOUTH TWICE A DAY    . aspirin EC 81 MG tablet Take 81 mg by mouth daily.    . calcitRIOL (ROCALTROL) 0.25 MCG capsule Take 1 capsule (0.25 mcg total) by mouth daily. 90 capsule 2  . clopidogrel (PLAVIX) 75 MG tablet TAKE 1 TABLET (75 MG TOTAL) BY MOUTH DAILY. 90 tablet 1  . gabapentin (NEURONTIN) 100 MG capsule Take 1 capsule (100 mg total) by mouth 2 (two) times daily. 180 capsule 3  . hydrocortisone (ANUSOL-HC) 25 MG suppository Place rectally.    Marland Kitchen levothyroxine (SYNTHROID, LEVOTHROID) 75 MCG tablet Take 1 tablet (75 mcg total) by mouth daily. 90 tablet 3  . metoprolol succinate (TOPROL-XL) 50 MG 24 hr tablet Take by mouth.    . metoprolol tartrate (LOPRESSOR) 25 MG tablet TAKE 1 TABLET (25 MG TOTAL) BY MOUTH 2 (TWO) TIMES DAILY. 180 tablet 3  . rosuvastatin (CRESTOR) 10 MG tablet TAKE 1/2 TABLET EVERY DAY 90 tablet 1  . terbinafine (LAMISIL) 250 MG tablet Take 1 tablet (250 mg total) by mouth daily. 90 tablet 0  . cephALEXin (KEFLEX) 500 MG capsule Take 1 capsule (500 mg total) by mouth 3 (three) times daily. (Patient not taking: Reported on 10/21/2017) 21 capsule 0  . Fluticasone-Salmeterol (ADVAIR DISKUS) 100-50 MCG/DOSE AEPB Inhale 1 puff into the lungs 2 (two) times daily. (Patient not taking: Reported on  10/21/2017) 1 each 0  . levothyroxine (SYNTHROID, LEVOTHROID) 88 MCG tablet TAKE 1 TABLET (88 MCG TOTAL) BY MOUTH DAILY BEFORE BREAKFAST. (Patient not taking: Reported on 12/09/2017) 90 tablet 3   No current facility-administered medications for this visit.     On ROS today: no neuropathic sx in arm, not ESRD yet   Physical Examination   Vitals:   12/09/17 1501 12/09/17 1507  BP: (!) 146/78 (!) 142/81  Pulse: 71 71  Resp: 14   Temp: (!) 97.5 F (36.4 C)   TempSrc: Oral   SpO2: 97%   Weight: 117 lb (53.1 kg)   Height: 5' 3.5" (1.613 m)    Body mass index is 20.4 kg/m.  General Alert, O x 3, WD, NAD  Pulmonary Sym exp, good B air movt, CTA B  Cardiac RRR, Nl S1, S2, no Murmurs, No rubs, No S3,S4  Vascular Vessel Right Left  Radial Palpable Palpable  Brachial Palpable Palpable  Ulnar Not palpable Not palpable    Musculo- skeletal M/S 5/5 throughout  , Extremities without ischemic changes    Neurologic Pain and light touch intact in extremities , Motor exam as listed above    Medical Decision Making   Katrina Yang is a 77 y.o. 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    Based on vein mapping and examination, this patient's permanent  access options include: R staged BVT.  I had an extensive discussion with this patient in regards to the nature of access surgery, including risk, benefits, and alternatives.    The patient is aware that the risks of access surgery include but are not limited to: bleeding, infection, steal syndrome, nerve damage, ischemic monomelic neuropathy, failure of access to mature, and possible need for additional access procedures in the future.  The patient is aware that I construct transposition procedures in two stages, requiring a separate operation to complete the procedure.  The patient has NOT agreed to proceed with the above procedure.  She is considering proceeding and will contact us once she is  ready to proceed.   Adele Barthel, MD, FACS [facility] Office: [phone] Pager: [phone]

## 2017-12-10 ENCOUNTER — Other Ambulatory Visit: Payer: Self-pay | Admitting: *Deleted

## 2017-12-10 ENCOUNTER — Encounter: Payer: Self-pay | Admitting: *Deleted

## 2017-12-13 IMAGING — CR DG CHEST 2V
2 series · 2 of 2 positions shown · non-contrast
Comparison: 07/29/2012

CLINICAL DATA: Cough and shortness of breath for several days.

EXAM:
CHEST  2 VIEW

[w chest pa]
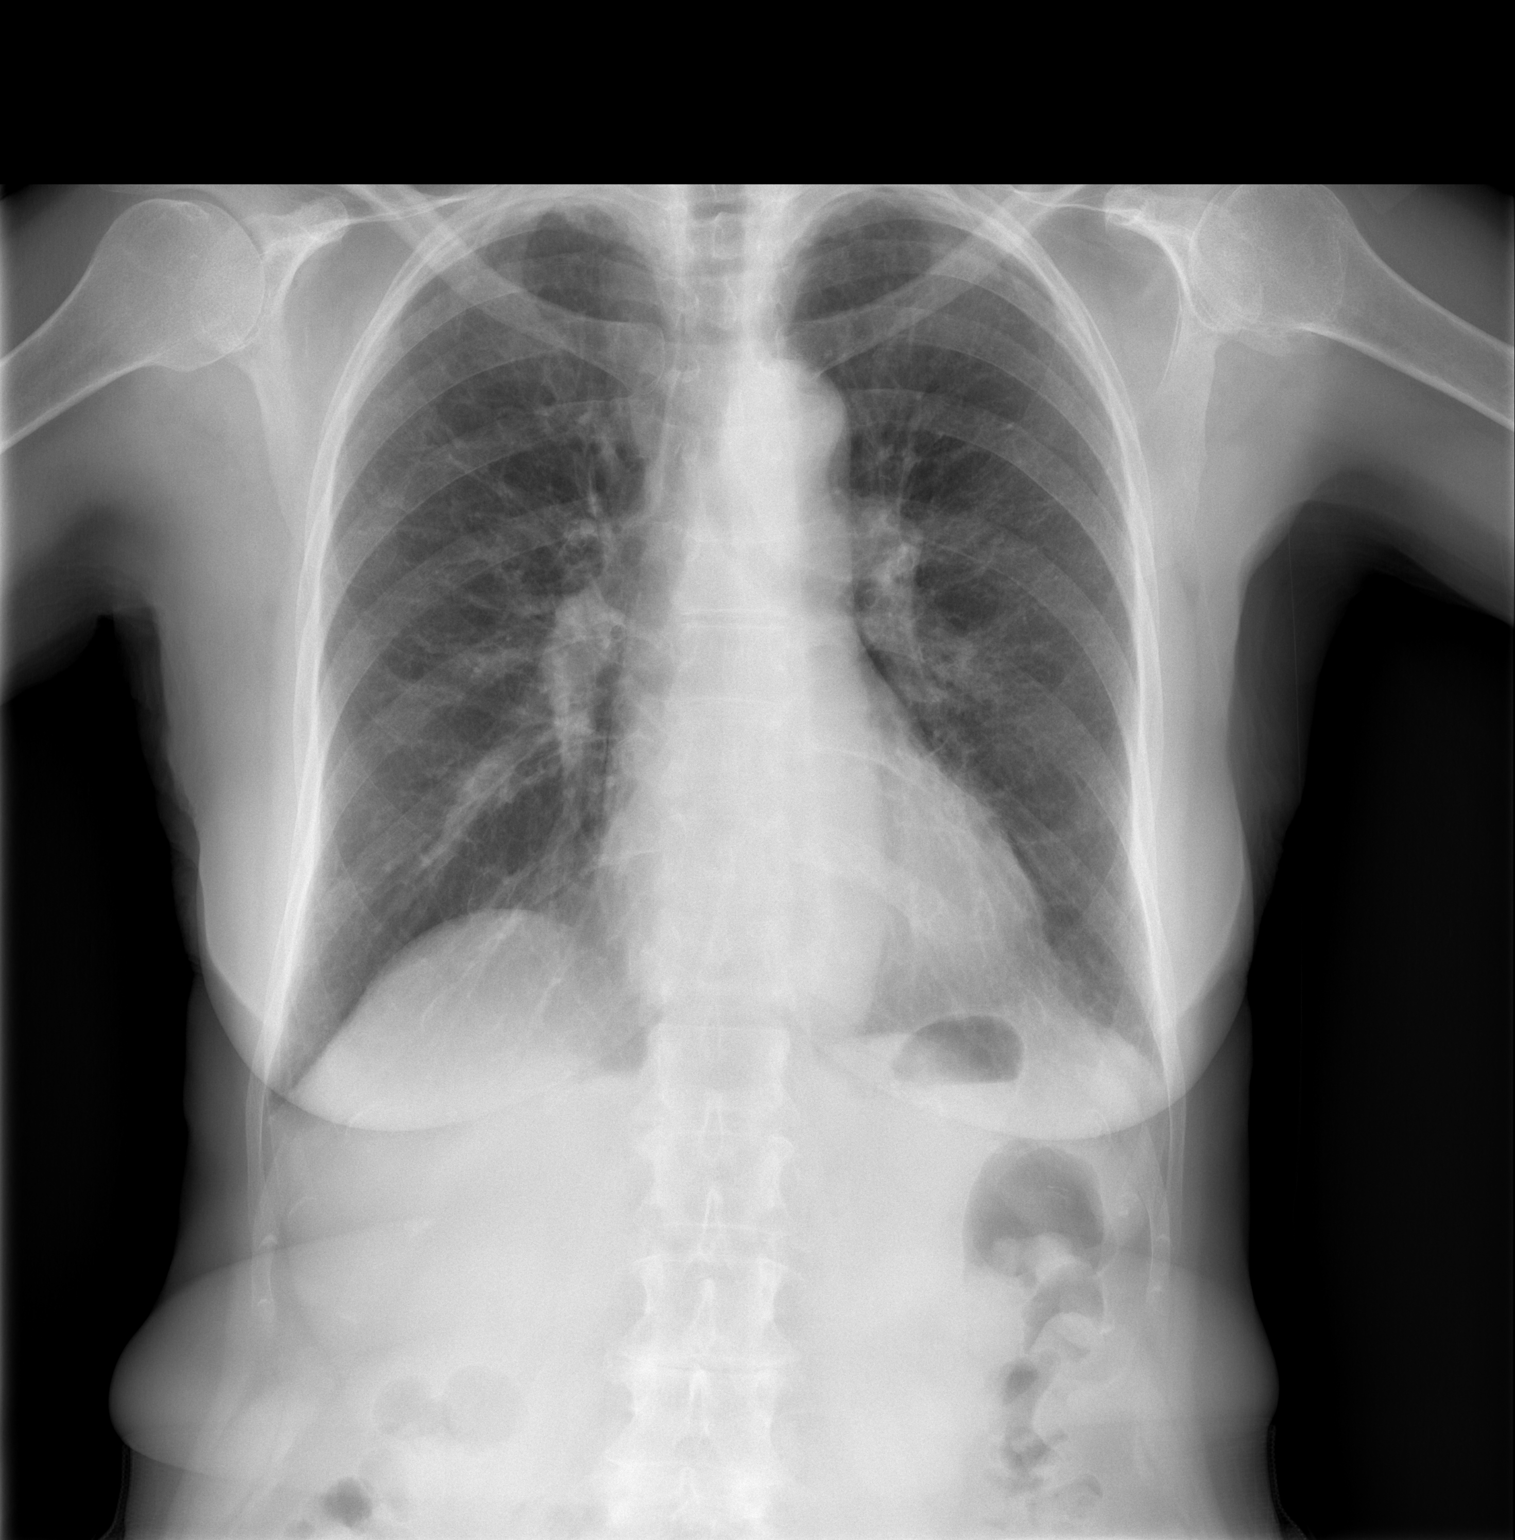

[w chest lat]
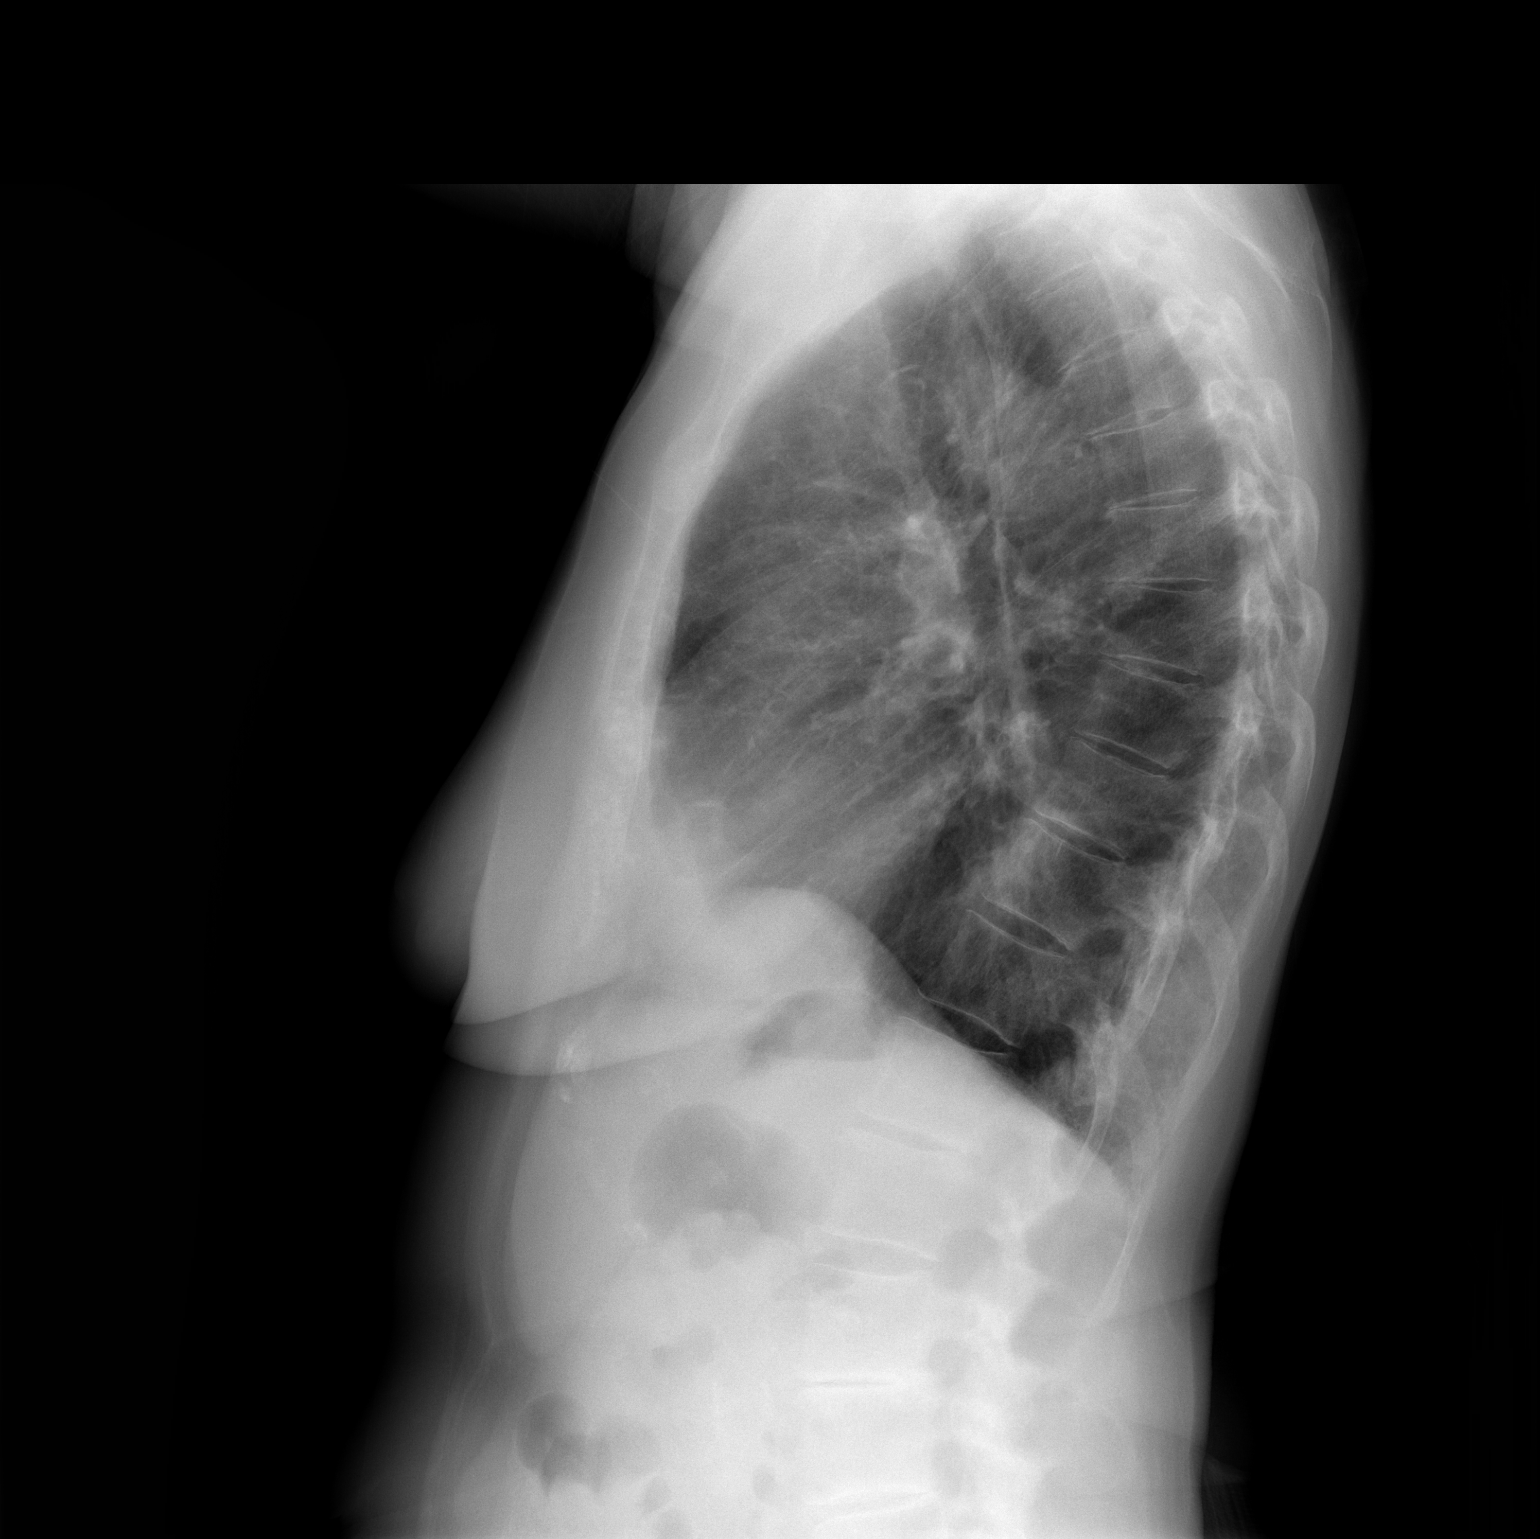

[2 of 2 positions shown; findings below may reference images not displayed]

FINDINGS: Cardiac silhouette is normal in size and configuration. No
mediastinal or hilar masses or evidence of adenopathy.

Lungs are mildly hyperexpanded. There is minor scarring at the
apices, stable. Lungs are otherwise clear. No pleural effusion or
pneumothorax.

Bony thorax is demineralized but grossly intact.
IMPRESSION: No active cardiopulmonary disease.

## 2017-12-26 ENCOUNTER — Encounter (HOSPITAL_COMMUNITY): Payer: Self-pay | Admitting: *Deleted

## 2017-12-26 NOTE — Progress Notes (Signed)
Patient denies chest pain, shob, or syncope. States she sees Acupuncturist cardiology. Last OV on chart. States she has stopped Plavix as directed. Device form faxed to the device clinic.

## 2017-12-28 NOTE — Progress Notes (Signed)
Anesthesia Chart Review: Katrina Yang  Case:  [id number] Date/Time:  12/29/17 0959   Procedure:  FIRST STAGE BASILIC VEIN TRANSPOSITION RIGHT ARM (Right )   Anesthesia type:  Monitor Anesthesia Care   Pre-op diagnosis:  CHRONIC KIDNEY DISEASE FOR HEMODILAYSIS ACCESS   Location:  [facility] OR ROOM 11 / [facility]   Surgeon:  Angelia Mould, MD      DISCUSSION: Patient is a 77 year old female scheduled for the above procedure. History includes never smoker, HTN, palpitations, hypercholesterolemia, CVA (on ASA/Plavix), secondary hyperparathyroidism, CKD, carotid bruit (1-39% BICA '18), bifascicular block with syncope (s/p [employer] 2272 [employer]urity MRI PPM, left 02/23/17).   Plavix is on hold for surgery. Continuing ASA.  She will get labs and evaluation on the day of surgery. Definitive anesthesia plan at that time.    VS: For day of surgery.  PROVIDERS: Renato Shin, MD is PCP/endocrinologist Kathrynn Ducking, MD is neurologist. Last visit 07/01/17 for follow-up left occipital neuralgia, headaches, cervical spondylosis. Marland Kitchen  Virl Axe, MD is EP. Last visit 06/01/17.    LABS: For labs on the day of surgery.   IMAGES: CXR 08/31/17: IMPRESSION: No active cardiopulmonary disease. Aortic Atherosclerosis (ICD10-I70.0).   EKG: 06/01/17: Atrial pacemaker. Right BBB.    CV: Echo 02/23/17: Study Conclusions - Left ventricle: The cavity size was normal. Wall thickness was   normal. Systolic function was normal. The estimated ejection   fraction was in the range of 60% to 65%. Wall motion was normal;   there were no regional wall motion abnormalities. Doppler   parameters are consistent with abnormal left ventricular   relaxation (grade 1 diastolic dysfunction). - Aortic valve: There was no stenosis. - Mitral valve: There was no significant regurgitation. - Right ventricle: The cavity size was normal. Systolic function   was normal. - Pulmonary arteries: No complete TR doppler  jet so unable to   estimate PA systolic pressure. - Inferior vena cava: The vessel was normal in size. The   respirophasic diameter changes were in the normal range (>= 50%),   consistent with normal central venous pressure. Impressions: - Normal LV size and systolic function, EF 10-27%. Normal RV size   and systolic function. No significant valvular abnormalities.  Carotid U/S 02/23/17: Summary: Bilateral: mixed plaque origin ICA. 1-39% ICA plaquing. Vertebral artery flow is antegrade.  Event monitor 12/30/12: SR  Low risk stress test in 2007.   Past Medical History:  Diagnosis Date  . Anemia   . Bifascicular block   . Carotid bruit    a.  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  . Cervical spondylosis without myelopathy 02/01/2015  . CKD (chronic kidney disease), stage IV ([place])   . Colonic polyp    hx of  . CVA (cerebral vascular accident) ([place])    hx of  . Degenerative joint disease   . Diverticulosis of colon (without mention of hemorrhage)   . History of hysterectomy   . Hypertension    a. Renal artery dopplers in 2012 with no evidence for significant renal artery stenosis.   . Occipital neuralgia of left side 08/30/2013  . Osteoporosis, unspecified   . Palpitations    a. Event monitor (8/14) with no significant abnormalities.   . Peripheral neuropathic pain   . Personal history of unspecified urinary disorder   . Presence of permanent cardiac pacemaker   . Pulmonary nodule   . Pure hypercholesterolemia   . Secondary hyperparathyroidism (of renal origin)   .  Unspecified hypothyroidism     Past Surgical History:  Procedure Laterality Date  . ABDOMINAL HYSTERECTOMY    . BACK SURGERY     diskectomy L-5. Multiple back surgeries  . MENISCECTOMY Left 11/14/15  . PACEMAKER IMPLANT N/A 02/23/2017   Procedure: PACEMAKER IMPLANT;  Surgeon: Evans Lance, MD;  Location: [facility];  Service: Cardiovascular;  Laterality: N/A;    MEDICATIONS: No current  facility-administered medications for this encounter.    Marland Kitchen amLODipine (NORVASC) 5 MG tablet  . aspirin EC 81 MG tablet  . calcitRIOL (ROCALTROL) 0.25 MCG capsule  . clopidogrel (PLAVIX) 75 MG tablet  . gabapentin (NEURONTIN) 100 MG capsule  . levothyroxine (SYNTHROID, LEVOTHROID) 88 MCG tablet  . metoprolol tartrate (LOPRESSOR) 25 MG tablet  . rosuvastatin (CRESTOR) 10 MG tablet  . traMADol (ULTRAM) 50 MG tablet    George Hugh [facility]/Anesthesiology Phone [phone] 12/28/2017 3:28 PM

## 2017-12-29 ENCOUNTER — Ambulatory Visit (HOSPITAL_COMMUNITY): Payer: Medicare Other | Admitting: Physician Assistant

## 2017-12-29 ENCOUNTER — Encounter (HOSPITAL_COMMUNITY): Payer: Self-pay | Admitting: *Deleted

## 2017-12-29 ENCOUNTER — Ambulatory Visit (HOSPITAL_COMMUNITY)
Admission: RE | Admit: 2017-12-29 | Discharge: 2017-12-29 | Disposition: A | Discharge status: Home / Self Care | Payer: Medicare Other | Source: Ambulatory Visit | Attending: Vascular Surgery | Admitting: Vascular Surgery

## 2017-12-29 ENCOUNTER — Encounter (HOSPITAL_COMMUNITY)
Admission: RE | Disposition: A | Discharge status: Home / Self Care | Payer: Self-pay | Source: Ambulatory Visit | Attending: Vascular Surgery

## 2017-12-29 DIAGNOSIS — Z7982 Long term (current) use of aspirin: Secondary | ICD-10-CM | POA: Diagnosis not present

## 2017-12-29 DIAGNOSIS — I739 Peripheral vascular disease, unspecified: Secondary | ICD-10-CM | POA: Insufficient documentation

## 2017-12-29 DIAGNOSIS — M810 Age-related osteoporosis without current pathological fracture: Secondary | ICD-10-CM | POA: Diagnosis not present

## 2017-12-29 DIAGNOSIS — Z882 Allergy status to sulfonamides status: Secondary | ICD-10-CM | POA: Insufficient documentation

## 2017-12-29 DIAGNOSIS — Z885 Allergy status to narcotic agent status: Secondary | ICD-10-CM | POA: Diagnosis not present

## 2017-12-29 DIAGNOSIS — K219 Gastro-esophageal reflux disease without esophagitis: Secondary | ICD-10-CM | POA: Insufficient documentation

## 2017-12-29 DIAGNOSIS — N2581 Secondary hyperparathyroidism of renal origin: Secondary | ICD-10-CM | POA: Diagnosis not present

## 2017-12-29 DIAGNOSIS — Z8673 Personal history of transient ischemic attack (TIA), and cerebral infarction without residual deficits: Secondary | ICD-10-CM | POA: Diagnosis not present

## 2017-12-29 DIAGNOSIS — Z950 Presence of cardiac pacemaker: Secondary | ICD-10-CM | POA: Diagnosis not present

## 2017-12-29 DIAGNOSIS — Z91041 Radiographic dye allergy status: Secondary | ICD-10-CM | POA: Insufficient documentation

## 2017-12-29 DIAGNOSIS — Z91040 Latex allergy status: Secondary | ICD-10-CM | POA: Diagnosis not present

## 2017-12-29 DIAGNOSIS — I129 Hypertensive chronic kidney disease with stage 1 through stage 4 chronic kidney disease, or unspecified chronic kidney disease: Secondary | ICD-10-CM | POA: Diagnosis present

## 2017-12-29 DIAGNOSIS — E039 Hypothyroidism, unspecified: Secondary | ICD-10-CM | POA: Insufficient documentation

## 2017-12-29 DIAGNOSIS — I452 Bifascicular block: Secondary | ICD-10-CM | POA: Insufficient documentation

## 2017-12-29 DIAGNOSIS — E7800 Pure hypercholesterolemia, unspecified: Secondary | ICD-10-CM | POA: Diagnosis not present

## 2017-12-29 DIAGNOSIS — M1990 Unspecified osteoarthritis, unspecified site: Secondary | ICD-10-CM | POA: Diagnosis not present

## 2017-12-29 DIAGNOSIS — Z7902 Long term (current) use of antithrombotics/antiplatelets: Secondary | ICD-10-CM | POA: Diagnosis not present

## 2017-12-29 DIAGNOSIS — N184 Chronic kidney disease, stage 4 (severe): Secondary | ICD-10-CM | POA: Diagnosis not present

## 2017-12-29 DIAGNOSIS — N185 Chronic kidney disease, stage 5: Secondary | ICD-10-CM

## 2017-12-29 HISTORY — DX: Bifascicular block: I45.2

## 2017-12-29 HISTORY — DX: Presence of cardiac pacemaker: Z95.0

## 2017-12-29 HISTORY — PX: BASCILIC VEIN TRANSPOSITION: SHX5742

## 2017-12-29 LAB — POCT I-STAT 4, (NA,K, GLUC, HGB,HCT)
Glucose, Bld: 87 mg/dL (ref 70–99)
HCT: 33 % — ABNORMAL LOW (ref 36.0–46.0)
Hemoglobin: 11.2 g/dL — ABNORMAL LOW (ref 12.0–15.0)
POTASSIUM: 5 mmol/L (ref 3.5–5.1)
Sodium: 140 mmol/L (ref 135–145)

## 2017-12-29 SURGERY — TRANSPOSITION, VEIN, BASILIC
Anesthesia: Monitor Anesthesia Care | Site: Arm Upper | Laterality: Right

## 2017-12-29 MED ORDER — OXYCODONE HCL 5 MG PO TABS: 5.0000 mg | ORAL_TABLET | ORAL | 0 refills | Status: DC | PRN

## 2017-12-29 MED ORDER — ONDANSETRON HCL 4 MG/2ML IJ SOLN: 4.0000 mg | Freq: Four times a day (QID) | INTRAMUSCULAR | Status: DC | PRN

## 2017-12-29 MED ORDER — SODIUM CHLORIDE 0.9 % IV SOLN
INTRAVENOUS | Status: DC
  Administered 2017-12-29: 20 mL/h via INTRAVENOUS

## 2017-12-29 MED ORDER — CHLORHEXIDINE GLUCONATE 4 % EX LIQD: 60.0000 mL | Freq: Once | CUTANEOUS | Status: DC

## 2017-12-29 MED ORDER — PROPOFOL 10 MG/ML IV BOLUS
INTRAVENOUS | Status: DC | PRN
  Administered 2017-12-29: 10 mg via INTRAVENOUS
  Administered 2017-12-29: 20 mg via INTRAVENOUS

## 2017-12-29 MED ORDER — PROPOFOL 500 MG/50ML IV EMUL
INTRAVENOUS | Status: DC | PRN
  Administered 2017-12-29: 75 ug/kg/min via INTRAVENOUS

## 2017-12-29 MED ORDER — CEFAZOLIN SODIUM-DEXTROSE 2-4 GM/100ML-% IV SOLN
INTRAVENOUS | Status: AC
  Filled 2017-12-29: qty 100

## 2017-12-29 MED ORDER — SODIUM CHLORIDE 0.9 % IV SOLN
INTRAVENOUS | Status: AC
  Filled 2017-12-29: qty 1.2

## 2017-12-29 MED ORDER — MIDAZOLAM HCL 2 MG/2ML IJ SOLN
INTRAMUSCULAR | Status: AC
  Filled 2017-12-29: qty 2

## 2017-12-29 MED ORDER — LIDOCAINE HCL (PF) 1 % IJ SOLN
INTRAMUSCULAR | Status: AC
  Filled 2017-12-29: qty 30

## 2017-12-29 MED ORDER — PROTAMINE SULFATE 10 MG/ML IV SOLN
INTRAVENOUS | Status: DC | PRN
  Administered 2017-12-29: 20 mg via INTRAVENOUS

## 2017-12-29 MED ORDER — SODIUM CHLORIDE 0.9 % IV SOLN
INTRAVENOUS | Status: DC | PRN
  Administered 2017-12-29: 500 mL

## 2017-12-29 MED ORDER — MIDAZOLAM HCL 5 MG/5ML IJ SOLN
INTRAMUSCULAR | Status: DC | PRN
  Administered 2017-12-29: 1 mg via INTRAVENOUS

## 2017-12-29 MED ORDER — FENTANYL CITRATE (PF) 250 MCG/5ML IJ SOLN
INTRAMUSCULAR | Status: AC
  Filled 2017-12-29: qty 5

## 2017-12-29 MED ORDER — OXYCODONE HCL 5 MG/5ML PO SOLN: 5.0000 mg | Freq: Once | ORAL | Status: DC | PRN

## 2017-12-29 MED ORDER — OXYCODONE HCL 5 MG PO TABS: 5.0000 mg | ORAL_TABLET | Freq: Once | ORAL | Status: DC | PRN

## 2017-12-29 MED ORDER — 0.9 % SODIUM CHLORIDE (POUR BTL) OPTIME
TOPICAL | Status: DC | PRN
  Administered 2017-12-29: 1000 mL

## 2017-12-29 MED ORDER — CEFAZOLIN SODIUM-DEXTROSE 2-4 GM/100ML-% IV SOLN
2.0000 g | INTRAVENOUS | Status: AC
  Administered 2017-12-29: 2 g via INTRAVENOUS

## 2017-12-29 MED ORDER — LIDOCAINE HCL (PF) 1 % IJ SOLN
INTRAMUSCULAR | Status: DC | PRN
  Administered 2017-12-29: 15 mL

## 2017-12-29 MED ORDER — FENTANYL CITRATE (PF) 100 MCG/2ML IJ SOLN: 25.0000 ug | INTRAMUSCULAR | Status: DC | PRN

## 2017-12-29 MED ORDER — HEPARIN SODIUM (PORCINE) 1000 UNIT/ML IJ SOLN
INTRAMUSCULAR | Status: DC | PRN
  Administered 2017-12-29: 5000 [IU] via INTRAVENOUS

## 2017-12-29 SURGICAL SUPPLY — 41 items
ADH SKN CLS APL DERMABOND .7 (GAUZE/BANDAGES/DRESSINGS) ×1
ARMBAND PINK RESTRICT EXTREMIT (MISCELLANEOUS) ×3 IMPLANT
CANISTER SUCT 3000ML PPV (MISCELLANEOUS) ×3 IMPLANT
CANNULA VESSEL 3MM 2 BLNT TIP (CANNULA) ×2 IMPLANT
CLIP VESOCCLUDE MED 24/CT (CLIP) ×3 IMPLANT
CLIP VESOCCLUDE SM WIDE 24/CT (CLIP) ×3 IMPLANT
COVER PROBE W GEL 5X96 (DRAPES) IMPLANT
DECANTER SPIKE VIAL GLASS SM (MISCELLANEOUS) ×3 IMPLANT
DERMABOND ADVANCED (GAUZE/BANDAGES/DRESSINGS) ×2
DERMABOND ADVANCED .7 DNX12 (GAUZE/BANDAGES/DRESSINGS) ×1 IMPLANT
ELECT REM PT RETURN 9FT ADLT (ELECTROSURGICAL) ×3
ELECTRODE REM PT RTRN 9FT ADLT (ELECTROSURGICAL) ×1 IMPLANT
GLOVE BIO SURGEON STRL SZ7.5 (GLOVE) ×1 IMPLANT
GLOVE BIOGEL PI IND STRL 6.5 (GLOVE) IMPLANT
GLOVE BIOGEL PI IND STRL 7.0 (GLOVE) IMPLANT
GLOVE BIOGEL PI IND STRL 7.5 (GLOVE) IMPLANT
GLOVE BIOGEL PI IND STRL 8 (GLOVE) ×1 IMPLANT
GLOVE BIOGEL PI INDICATOR 6.5 (GLOVE) ×2
GLOVE BIOGEL PI INDICATOR 7.0 (GLOVE) ×4
GLOVE BIOGEL PI INDICATOR 7.5 (GLOVE) ×2
GLOVE BIOGEL PI INDICATOR 8 (GLOVE) ×2
GLOVE SURG SS PI 6.5 STRL IVOR (GLOVE) ×2 IMPLANT
GLOVE SURG SS PI 7.0 STRL IVOR (GLOVE) ×2 IMPLANT
GLOVE SURG SS PI 7.5 STRL IVOR (GLOVE) ×2 IMPLANT
GOWN STRL REUS W/ TWL LRG LVL3 (GOWN DISPOSABLE) ×3 IMPLANT
GOWN STRL REUS W/TWL LRG LVL3 (GOWN DISPOSABLE) ×9
KIT BASIN OR (CUSTOM PROCEDURE TRAY) ×3 IMPLANT
KIT TURNOVER KIT B (KITS) ×3 IMPLANT
NS IRRIG 1000ML POUR BTL (IV SOLUTION) ×3 IMPLANT
PACK CV ACCESS (CUSTOM PROCEDURE TRAY) ×3 IMPLANT
PAD ARMBOARD 7.5X6 YLW CONV (MISCELLANEOUS) ×6 IMPLANT
SPONGE SURGIFOAM ABS GEL 100 (HEMOSTASIS) IMPLANT
SUT MNCRL AB 4-0 PS2 18 (SUTURE) ×2 IMPLANT
SUT PROLENE 6 0 BV (SUTURE) ×3 IMPLANT
SUT SILK 2 0 SH (SUTURE) IMPLANT
SUT VIC AB 3-0 SH 27 (SUTURE) ×3
SUT VIC AB 3-0 SH 27X BRD (SUTURE) ×1 IMPLANT
SUT VICRYL 4-0 PS2 18IN ABS (SUTURE) ×3 IMPLANT
TOWEL GREEN STERILE (TOWEL DISPOSABLE) ×3 IMPLANT
UNDERPAD 30X30 (UNDERPADS AND DIAPERS) ×3 IMPLANT
WATER STERILE IRR 1000ML POUR (IV SOLUTION) ×3 IMPLANT

## 2017-12-29 NOTE — Anesthesia Preprocedure Evaluation (Signed)
Anesthesia Evaluation  Patient identified by MRN, date of birth, ID band Patient awake    Reviewed: Allergy & Precautions, H&P , NPO status , Patient's Chart, lab work & pertinent test results  Airway Mallampati: II   Neck ROM: full    Dental   Pulmonary neg pulmonary ROS,    breath sounds clear to auscultation       Cardiovascular hypertension, + Peripheral Vascular Disease  + dysrhythmias + pacemaker  Rhythm:regular Rate:Normal     Neuro/Psych  Neuromuscular disease CVA    GI/Hepatic GERD  ,  Endo/Other  Hypothyroidism   Renal/GU ESRFRenal disease     Musculoskeletal  (+) Arthritis ,   Abdominal   Peds  Hematology   Anesthesia Other Findings   Reproductive/Obstetrics                             Anesthesia Physical Anesthesia Plan  ASA: III  Anesthesia Plan: MAC   Post-op Pain Management:    Induction: Intravenous  PONV Risk Score and Plan: 2 and Ondansetron, Propofol infusion and Treatment may vary due to age or medical condition  Airway Management Planned: Simple Face Mask  Additional Equipment:   Intra-op Plan:   Post-operative Plan:   Informed Consent: I have reviewed the patients History and Physical, chart, labs and discussed the procedure including the risks, benefits and alternatives for the proposed anesthesia with the patient or authorized representative who has indicated his/her understanding and acceptance.     Plan Discussed with: CRNA, Anesthesiologist and Surgeon  Anesthesia Plan Comments:         Anesthesia Quick Evaluation

## 2017-12-29 NOTE — Transfer of Care (Signed)
Immediate Anesthesia Transfer of Care Note  Patient: Katrina Yang  Procedure(s) Performed: FIRST STAGE BASILIC VEIN TRANSPOSITION RIGHT ARM (Right Arm Upper)  Patient Location: PACU  Anesthesia Type:MAC  Level of Consciousness: awake, oriented and patient cooperative  Airway & Oxygen Therapy: Patient Spontanous Breathing  Post-op Assessment: Report given to RN, Post -op Vital signs reviewed and stable and Patient moving all extremities  Post vital signs: Reviewed and stable  Last Vitals:  Vitals Value Taken Time  BP 118/69 12/29/2017 11:45 AM  Temp    Pulse 69 12/29/2017 11:46 AM  Resp 16 12/29/2017 11:46 AM  SpO2 99 % 12/29/2017 11:46 AM  Vitals shown include unvalidated device data.  Last Pain:  Vitals:   12/29/17 0828  PainSc: 0-No pain         Complications: No apparent anesthesia complications

## 2017-12-29 NOTE — Op Note (Signed)
    NAME: [name]    MRN: [id number] DOB: [birth year]    DATE OF OPERATION: 12/29/2017  PREOP DIAGNOSIS:    Stage IV chronic kidney disease  POSTOP DIAGNOSIS:    Same  PROCEDURE:    Right first stage basilic vein transposition  SURGEON: Judeth Cornfield. Scot Dock, MD, FACS  ASSIST: Laurence Slate, PA  ANESTHESIA: Local with sedation  EBL: Minimal  INDICATIONS:    Katrina Yang is a 77 y.o. female who was evaluated in the office by Dr. Adele Barthel and was felt to be a good candidate for a first stage basilic vein transposition.  She has a pacemaker on the left side and a right sided approach was used.  FINDINGS:   3 mm basilic vein  TECHNIQUE:   The patient was taken to the operating room and I looked at the basilic vein with the SonoSite and although small I thought it was reasonable to attempt a first stage basilic vein transposition.  After the skin was anesthetized with 1% lidocaine and after the arm had been prepped and draped in usual sterile fashion a longitudinal incision was made over the basilic vein.  This was dissected free proximally and distally and branches were divided between clips and 3-0 silk ties.  It was gently gently distended up with heparinized saline.  The artery was dissected free beneath the fascia and the patient was heparinized.  The brachial artery was clamped proximally and distally and a longitudinal arteriotomy was made.  The vein was spatulated and sewn into side to the artery using continuous 6-0 Prolene suture.  At the completion was a good thrill in the fistula and a palpable radial pulse.  The heparin was partially reversed with protamine.  The wound was then closed with a deep layer of 3-0 Vicryl and the skin closed with 4-0 Vicryl.  Dermabond was applied.  The patient tolerated the procedure well and was transferred to the recovery room in stable condition.  All needle and sponge counts were correct.  Deitra Mayo, MD, FACS [facility]  DATE OF DICTATION:   12/29/2017

## 2017-12-29 NOTE — H&P (Signed)
REASON FOR VISIT:    For hemodialysis access.  HPI:   Katrina Yang is a pleasant 77 y.o. female, who was seen in consultation by Dr. Adele Barthel on 12/09/2017.  The patient was evaluated for permanent access.  She is right-hand dominant.  She has a left subclavian pacemaker.  The patient has previously been evaluated and a first stage right basilic vein transposition was recommended.  Past Medical History:  Diagnosis Date  . Anemia   . Bifascicular block   . Carotid bruit    a.  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  . Cervical spondylosis without myelopathy 02/01/2015  . CKD (chronic kidney disease), stage IV ([place])   . Colonic polyp    hx of  . CVA (cerebral vascular accident) ([place])    hx of  . Degenerative joint disease   . Diverticulosis of colon (without mention of hemorrhage)   . History of hysterectomy   . Hypertension    a. Renal artery dopplers in 2012 with no evidence for significant renal artery stenosis.   . Occipital neuralgia of left side 08/30/2013  . Osteoporosis, unspecified   . Palpitations    a. Event monitor (8/14) with no significant abnormalities.   . Peripheral neuropathic pain   . Personal history of unspecified urinary disorder   . Presence of permanent cardiac pacemaker   . Pulmonary nodule   . Pure hypercholesterolemia   . Secondary hyperparathyroidism (of renal origin)   . Unspecified hypothyroidism     Family History  Problem Relation Age of Onset  . Kidney cancer Father   . Heart attack Father   . Heart disease Father   . Colon cancer Mother   . Ovarian cancer Mother   . Heart disease Mother   . Heart failure Mother   . Stomach cancer Neg Hx     SOCIAL HISTORY: Social History   Socioeconomic History  . Marital status: Married    Spouse name: [name]  . Number of children: 1  . Years of education: 2  . Highest education level: Not on file  Occupational History  . Occupation: Retired    Fish farm manager: [employer]   Comment: Metallurgist  Social Needs  . Financial resource strain: Not on file  . Food insecurity:    Worry: Not on file    Inability: Not on file  . Transportation needs:    Medical: Not on file    Non-medical: Not on file  Tobacco Use  . Smoking status: Never Smoker  . Smokeless tobacco: Never Used  Substance and Sexual Activity  . Alcohol use: No  . Drug use: No  . Sexual activity: Not on file  Lifestyle  . Physical activity:    Days per week: Not on file    Minutes per session: Not on file  . Stress: Not on file  Relationships  . Social connections:    Talks on phone: Not on file    Gets together: Not on file    Attends religious service: Not on file    Active member of club or organization: Not on file    Attends meetings of clubs or organizations: Not on file    Relationship status: Not on file  . Intimate partner violence:    Fear of current or ex partner: Not on file    Emotionally abused: Not on file    Physically abused: Not on file    Forced sexual activity: Not on file  Other Topics  Concern  . Not on file  Social History Narrative   Pt lives at home with her spouse.   Patient drinks 1 glass of tea every other day.   Patient is right handed.    Allergies  Allergen Reactions  . Iodinated Diagnostic Agents Swelling and Anaphylaxis    Throat swelling  . Latex Rash  . Codeine Nausea And Vomiting and Rash  . Lovastatin Nausea Only  . Metronidazole Nausea And Vomiting  . Sulfa Antibiotics Nausea And Vomiting and Rash    Current Facility-Administered Medications  Medication Dose Route Frequency Provider Last Rate Last Dose  . 0.9 %  sodium chloride infusion   Intravenous Continuous Elam Dutch, MD 20 mL/hr at 12/29/17 [phone]    . ceFAZolin (ANCEF) 2-4 GM/100ML-% IVPB           . ceFAZolin (ANCEF) IVPB 2g/100 mL premix  2 g Intravenous 30 min Pre-Op Fields, Jessy Oto, MD      . chlorhexidine (HIBICLENS) 4 % liquid 4 application  60 mL Topical Once  Elam Dutch, MD       And  . Derrill Memo ON 12/30/2017] chlorhexidine (HIBICLENS) 4 % liquid 4 application  60 mL Topical Once Elam Dutch, MD        REVIEW OF SYSTEMS:  [X]  denotes positive finding, [ ]  denotes negative finding Cardiac  Comments:  Chest pain or chest pressure:    Shortness of breath upon exertion:    Short of breath when lying flat:    Irregular heart rhythm:        Vascular    Pain in calf, thigh, or hip brought on by ambulation:    Pain in feet at night that wakes you up from your sleep:     Blood clot in your veins:    Leg swelling:         Pulmonary    Oxygen at home:    Productive cough:     Wheezing:         Neurologic    Sudden weakness in arms or legs:     Sudden numbness in arms or legs:     Sudden onset of difficulty speaking or slurred speech:    Temporary loss of vision in one eye:     Problems with dizziness:         Gastrointestinal    Blood in stool:     Vomited blood:         Genitourinary    Burning when urinating:     Blood in urine:        Psychiatric    Major depression:         Hematologic    Bleeding problems:    Problems with blood clotting too easily:        Skin    Rashes or ulcers:        Constitutional    Fever or chills:     PHYSICAL EXAM:   Vitals:   12/29/17 0800  BP: (!) 150/64  Pulse: 70  Resp: 16  SpO2: 100%    GENERAL: The patient is a well-nourished female, in no acute distress. The vital signs are documented above. CARDIAC: There is a regular rate and rhythm.  VASCULAR: The patient has a palpable radial and brachial pulse bilaterally. PULMONARY: There is good air exchange bilaterally without wheezing or rales. ABDOMEN: Soft and non-tender with normal pitched bowel sounds.  MUSCULOSKELETAL: There are no major deformities or cyanosis. NEUROLOGIC:  No focal weakness or paresthesias are detected. SKIN: There are no ulcers or rashes noted. PSYCHIATRIC: The patient has a normal affect.  DATA:     VEIN MAP: Looks like the patient's best option for a fistula on the right is a first stage basilic vein transposition.   ASSESSMENT & PLAN:   CHRONIC KIDNEY DISEASE: The patient presents for new access.  She has a pacemaker on the left side so we have recommended access in the right arm.  If the right upper arm basilic vein looks reasonable the plan would be for a first stage basilic vein transposition.  If this is not possible I would recommend placement of an AV graft.  Have discussed the procedure and potential complications with the patient.  All of her questions were encouraged and answered and she is agreeable to proceed.   Deitra Mayo [facility] [phone]

## 2017-12-30 ENCOUNTER — Telehealth: Payer: Self-pay | Admitting: Vascular Surgery

## 2017-12-30 ENCOUNTER — Encounter (HOSPITAL_COMMUNITY): Payer: Self-pay | Admitting: Vascular Surgery

## 2017-12-30 ENCOUNTER — Other Ambulatory Visit: Payer: Self-pay

## 2017-12-30 DIAGNOSIS — N184 Chronic kidney disease, stage 4 (severe): Secondary | ICD-10-CM

## 2017-12-30 NOTE — Anesthesia Postprocedure Evaluation (Signed)
Anesthesia Post Note  Patient: Katrina Yang  Procedure(s) Performed: FIRST STAGE BASILIC VEIN TRANSPOSITION RIGHT ARM (Right Arm Upper)     Patient location during evaluation: PACU Anesthesia Type: MAC Level of consciousness: awake and alert Pain management: pain level controlled Vital Signs Assessment: post-procedure vital signs reviewed and stable Respiratory status: spontaneous breathing, nonlabored ventilation, respiratory function stable and patient connected to nasal cannula oxygen Cardiovascular status: stable and blood pressure returned to baseline Postop Assessment: no apparent nausea or vomiting Anesthetic complications: no    Last Vitals:  Vitals:   12/29/17 1223 12/29/17 1238  BP: (!) 150/64 (!) 147/59  Pulse: 68 65  Resp: 19 18  Temp: (!) 36.2 C   SpO2: 100% 99%    Last Pain:  Vitals:   12/29/17 1238  PainSc: 0-No pain                 Briani Maul S

## 2017-12-30 NOTE — Telephone Encounter (Signed)
sch appt spk to pt 02/10/18 2pm Dialysis Duplex 3pm p/o MD

## 2017-12-31 ENCOUNTER — Other Ambulatory Visit: Payer: Self-pay | Admitting: Neurology

## 2018-01-02 LAB — CUP PACEART REMOTE DEVICE CHECK
Battery Remaining Longevity: 127 mo
Battery Remaining Percentage: 95.5 %
Battery Voltage: 3.02 V
Brady Statistic AS VS Percent: 88 %
Date Time Interrogation Session: 20190729132043
Implantable Lead Implant Date: 20181022
Implantable Lead Location: 753859
Lead Channel Impedance Value: 400 Ohm
Lead Channel Impedance Value: 680 Ohm
Lead Channel Pacing Threshold Amplitude: 0.375 V
Lead Channel Pacing Threshold Amplitude: 0.75 V
Lead Channel Pacing Threshold Pulse Width: 0.5 ms
Lead Channel Sensing Intrinsic Amplitude: 12 mV
Lead Channel Sensing Intrinsic Amplitude: 5 mV
Lead Channel Setting Pacing Pulse Width: 0.5 ms
Lead Channel Setting Sensing Sensitivity: 2 mV
MDC IDC LEAD IMPLANT DT: 20181022
MDC IDC LEAD LOCATION: 753860
MDC IDC MSMT LEADCHNL RA PACING THRESHOLD PULSEWIDTH: 0.5 ms
MDC IDC PG IMPLANT DT: 20181022
MDC IDC PG SERIAL: 8958692
MDC IDC SET LEADCHNL RA PACING AMPLITUDE: 2 V
MDC IDC SET LEADCHNL RV PACING AMPLITUDE: 0.625
MDC IDC STAT BRADY AP VP PERCENT: 1 %
MDC IDC STAT BRADY AP VS PERCENT: 12 %
MDC IDC STAT BRADY AS VP PERCENT: 1 %
MDC IDC STAT BRADY RA PERCENT PACED: 11 %
MDC IDC STAT BRADY RV PERCENT PACED: 1 %
Pulse Gen Model: 2272

## 2018-01-07 ENCOUNTER — Ambulatory Visit: Payer: Medicare Other | Admitting: Neurology

## 2018-02-04 ENCOUNTER — Ambulatory Visit (INDEPENDENT_AMBULATORY_CARE_PROVIDER_SITE_OTHER): Payer: Medicare Other

## 2018-02-04 DIAGNOSIS — Z23 Encounter for immunization: Secondary | ICD-10-CM

## 2018-02-04 NOTE — Progress Notes (Signed)
Per orders of Dr. Ellison injection of high dose flu given today by Hallie Ertl RMA . Patient tolerated injection well. 

## 2018-02-10 ENCOUNTER — Encounter: Payer: Medicare Other | Admitting: Vascular Surgery

## 2018-02-10 ENCOUNTER — Ambulatory Visit (HOSPITAL_COMMUNITY)
Admission: RE | Admit: 2018-02-10 | Discharge: 2018-02-10 | Disposition: A | Discharge status: Home / Self Care | Payer: Medicare Other | Source: Ambulatory Visit | Attending: Vascular Surgery | Admitting: Vascular Surgery

## 2018-02-10 ENCOUNTER — Encounter: Payer: Self-pay | Admitting: Endocrinology

## 2018-02-10 DIAGNOSIS — N184 Chronic kidney disease, stage 4 (severe): Secondary | ICD-10-CM | POA: Insufficient documentation

## 2018-02-23 ENCOUNTER — Encounter: Payer: Self-pay | Admitting: Family Medicine

## 2018-02-23 ENCOUNTER — Ambulatory Visit: Payer: Medicare Other | Admitting: Family Medicine

## 2018-02-23 VITALS — BP 124/70 | HR 77 | Temp 97.8°F | Ht 63.5 in | Wt 120.2 lb

## 2018-02-23 DIAGNOSIS — M1712 Unilateral primary osteoarthritis, left knee: Secondary | ICD-10-CM | POA: Diagnosis not present

## 2018-02-23 DIAGNOSIS — N184 Chronic kidney disease, stage 4 (severe): Secondary | ICD-10-CM

## 2018-02-23 NOTE — Progress Notes (Signed)
Subjective:  Patient ID: Katrina Yang, female    DOB: 12-22-1940  Age: 77 y.o. MRN: [id number]  CC: Establish Care   HPI Katrina Yang presents for establishment of care.  Out of room multiple medical issues the 2 that seem to be of greatest issue here recently is her stage lV kidney disease with a GFR of 9.  She tells me that she has a fistula in place but she is still producing urine.  She has severe osteoarthritis of her left knee and wears a brace.  She needs a placement but at this time surgery is reluctant to replace her knee because of her renal status.  She is receiving injections in her knee every 2 months.  Outpatient Medications Prior to Visit  Medication Sig Dispense Refill  . amLODipine (NORVASC) 5 MG tablet Take 5 mg by mouth 2 (two) times daily.    Marland Kitchen aspirin EC 81 MG tablet Take 81 mg by mouth daily.    . calcitRIOL (ROCALTROL) 0.25 MCG capsule Take 1 capsule (0.25 mcg total) by mouth daily. 90 capsule 2  . clopidogrel (PLAVIX) 75 MG tablet TAKE 1 TABLET (75 MG TOTAL) BY MOUTH DAILY. 90 tablet 0  . gabapentin (NEURONTIN) 100 MG capsule Take 1 capsule (100 mg total) by mouth 2 (two) times daily. (Patient taking differently: Take 100 mg by mouth at bedtime. ) 180 capsule 3  . levothyroxine (SYNTHROID, LEVOTHROID) 88 MCG tablet TAKE 1 TABLET (88 MCG TOTAL) BY MOUTH DAILY BEFORE BREAKFAST. 90 tablet 3  . metoprolol tartrate (LOPRESSOR) 25 MG tablet TAKE 1 TABLET (25 MG TOTAL) BY MOUTH 2 (TWO) TIMES DAILY. 180 tablet 3  . rosuvastatin (CRESTOR) 10 MG tablet TAKE 1/2 TABLET EVERY DAY (Patient taking differently: Take 5 mg by mouth daily. ) 90 tablet 1  . oxyCODONE (ROXICODONE) 5 MG immediate release tablet Take 1 tablet (5 mg total) by mouth every 4 (four) hours as needed. 15 tablet 0  . traMADol (ULTRAM) 50 MG tablet Take 50 mg by mouth 2 (two) times daily as needed. for pain  1   No facility-administered medications prior to visit.     ROS Review of Systems    Constitutional: Negative.   Respiratory: Negative.   Cardiovascular: Negative.   Gastrointestinal: Negative.   Musculoskeletal: Positive for arthralgias and gait problem.  Psychiatric/Behavioral: Negative.     Objective:  BP 124/70   Pulse 77   Temp 97.8 F (36.6 C) (Oral)   Ht 5' 3.5" (1.613 m)   Wt 120 lb 4 oz (54.5 kg)   SpO2 96%   BMI 20.97 kg/m   BP Readings from Last 3 Encounters:  02/23/18 124/70  12/29/17 (!) 147/59  12/09/17 (!) 142/81    Wt Readings from Last 3 Encounters:  02/23/18 120 lb 4 oz (54.5 kg)  12/09/17 117 lb (53.1 kg)  10/21/17 117 lb (53.1 kg)    Physical Exam  Constitutional: She is oriented to person, place, and time. She appears well-developed and well-nourished. No distress.  HENT:  Head: Normocephalic and atraumatic.  Right Ear: External ear normal.  Left Ear: External ear normal.  Eyes: Right eye exhibits no discharge. Left eye exhibits no discharge. No scleral icterus.  Neck: No JVD present. No tracheal deviation present.  Pulmonary/Chest: Effort normal.  Musculoskeletal:       Legs: Neurological: She is alert and oriented to person, place, and time.  Skin: She is not diaphoretic.  Psychiatric: She has a normal mood and  affect. Her behavior is normal.    Lab Results  Component Value Date   WBC 8.3 09/23/2017   HGB 11.2 (L) 12/29/2017   HCT 33.0 (L) 12/29/2017   PLT 283.0 09/23/2017   GLUCOSE 87 12/29/2017   CHOL 216 (H) 04/03/2017   TRIG 60.0 04/03/2017   HDL 97.60 04/03/2017   LDLDIRECT 107.3 02/14/2013   LDLCALC 106 (H) 04/03/2017   ALT 10 (L) 02/24/2017   AST 23 02/24/2017   NA 140 12/29/2017   K 5.0 12/29/2017   CL 114 (H) 02/24/2017   CREATININE 3.22 (H) 02/24/2017   BUN 38 (H) 02/24/2017   CO2 16 (L) 02/24/2017   TSH 1.87 04/03/2017   INR 1.03 02/23/2017    No results found.  Assessment & Plan:   Katrina Yang was seen today for establish care.  Diagnoses and all orders for this visit:  Primary  osteoarthritis of left knee  CKD (chronic kidney disease) stage 4, GFR 15-29 ml/min (HCC)   I have discontinued Katrina Yang. Katrina Yang's traMADol and oxyCODONE. I am also having her maintain her aspirin EC, amLODipine, gabapentin, calcitRIOL, rosuvastatin, levothyroxine, metoprolol tartrate, and clopidogrel.  No orders of the defined types were placed in this encounter.    Follow-up: No follow-ups on file.  Libby Maw, MD

## 2018-03-01 ENCOUNTER — Ambulatory Visit (INDEPENDENT_AMBULATORY_CARE_PROVIDER_SITE_OTHER): Payer: Medicare Other | Admitting: *Deleted

## 2018-03-01 ENCOUNTER — Ambulatory Visit: Payer: Medicare Other | Admitting: Internal Medicine

## 2018-03-01 ENCOUNTER — Encounter: Payer: Self-pay | Admitting: Internal Medicine

## 2018-03-01 VITALS — BP 130/72 | HR 75 | Ht 63.5 in | Wt 118.6 lb

## 2018-03-01 DIAGNOSIS — I4510 Unspecified right bundle-branch block: Secondary | ICD-10-CM | POA: Diagnosis not present

## 2018-03-01 DIAGNOSIS — I10 Essential (primary) hypertension: Secondary | ICD-10-CM

## 2018-03-01 DIAGNOSIS — R002 Palpitations: Secondary | ICD-10-CM | POA: Diagnosis not present

## 2018-03-01 DIAGNOSIS — Z950 Presence of cardiac pacemaker: Secondary | ICD-10-CM

## 2018-03-01 DIAGNOSIS — R55 Syncope and collapse: Secondary | ICD-10-CM | POA: Diagnosis not present

## 2018-03-01 NOTE — Patient Instructions (Signed)
Medication Instructions:  Your physician recommends that you continue on your current medications as directed. Please refer to the Current Medication list given to you today.  Labwork: None ordered.  Testing/Procedures: None ordered.  Follow-Up: Your physician recommends that you schedule a follow-up appointment in:   Katrina Yang, [place] in One Year  Remote monitoring is used to monitor your Pacemaker of ICD from home. This monitoring reduces the number of office visits required to check your device to one time per year. It allows [place] to keep an eye on the functioning of your device to ensure it is working properly. You are scheduled for a device check from home on 05/31/2018. You may send your transmission at any time that day. If you have a wireless device, the transmission will be sent automatically. After your physician reviews your transmission, you will receive a postcard with your next transmission date.     Any Other Special Instructions Will Be Listed Below (If Applicable).     If you need a refill on your cardiac medications before your next appointment, please call your pharmacy.

## 2018-03-01 NOTE — Progress Notes (Signed)
Patient Care Team: Libby Maw, MD as PCP - General (Family Medicine) Love, Alyson Locket, MD (Neurology) Larey Dresser, MD as Consulting Physician (Cardiology) Raina Mina, MD (Nephrology) Kathrynn Ducking, MD as Consulting Physician (Neurology) Dahlia Byes (Ophthalmology) Corliss Parish, MD as Consulting Physician (Nephrology)   HPI  Katrina Yang is a 77 y.o. female Seen in follow-up for pacemaker implanted 10/18 for syncope abrupt in onset and offset in the context of underlying bifascicular block.  She also has a history of cryptogenic stroke with discussions for loop recorder implantation.       The patient denies chest pain, shortness of breath, nocturnal dyspnea, orthopnea or peripheral edema.  There have been no palpitations, lightheadedness or syncope.   She has a fistula now in her right arm   DATE TEST    8/18 Echo    EF 55 % LA normal  11/18  Echo    EF 60-65 %  LA (37/2 0.4/25)         Date Cr TSH  10/18 3.22 1.87  10/19  4.64      Records and Results Reviewed   Past Medical History:  Diagnosis Date  . Anemia   . Bifascicular block   . Carotid bruit    a.  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  . Cervical spondylosis without myelopathy 02/01/2015  . CKD (chronic kidney disease), stage IV ([place])   . Colonic polyp    hx of  . CVA (cerebral vascular accident) ([place])    hx of  . Degenerative joint disease   . Diverticulosis of colon (without mention of hemorrhage)   . History of hysterectomy   . Hypertension    a. Renal artery dopplers in 2012 with no evidence for significant renal artery stenosis.   . Occipital neuralgia of left side 08/30/2013  . Osteoporosis, unspecified   . Palpitations    a. Event monitor (8/14) with no significant abnormalities.   . Peripheral neuropathic pain   . Personal history of unspecified urinary disorder   . Presence of permanent cardiac pacemaker   . Pulmonary nodule   . Pure  hypercholesterolemia   . Secondary hyperparathyroidism (of renal origin)   . Unspecified hypothyroidism     Past Surgical History:  Procedure Laterality Date  . ABDOMINAL HYSTERECTOMY    . BACK SURGERY     diskectomy L-5. Multiple back surgeries  . BASCILIC VEIN TRANSPOSITION Right 12/29/2017   Procedure: FIRST STAGE BASILIC VEIN TRANSPOSITION RIGHT ARM;  Surgeon: Angelia Mould, MD;  Location: [facility];  Service: Vascular;  Laterality: Right;  . MENISCECTOMY Left 11/14/15  . PACEMAKER IMPLANT N/A 02/23/2017   Procedure: PACEMAKER IMPLANT;  Surgeon: Evans Lance, MD;  Location: [facility];  Service: Cardiovascular;  Laterality: N/A;    Current Outpatient Medications  Medication Sig Dispense Refill  . amLODipine (NORVASC) 5 MG tablet Take 5 mg by mouth 2 (two) times daily.    Marland Kitchen aspirin EC 81 MG tablet Take 81 mg by mouth daily.    . calcitRIOL (ROCALTROL) 0.25 MCG capsule Take 1 capsule (0.25 mcg total) by mouth daily. 90 capsule 2  . clopidogrel (PLAVIX) 75 MG tablet TAKE 1 TABLET (75 MG TOTAL) BY MOUTH DAILY. 90 tablet 0  . gabapentin (NEURONTIN) 100 MG capsule Take 1 capsule (100 mg total) by mouth 2 (two) times daily. 180 capsule 3  . levothyroxine (SYNTHROID, LEVOTHROID) 88 MCG tablet TAKE 1 TABLET (88  MCG TOTAL) BY MOUTH DAILY BEFORE BREAKFAST. 90 tablet 3  . metoprolol tartrate (LOPRESSOR) 25 MG tablet TAKE 1 TABLET (25 MG TOTAL) BY MOUTH 2 (TWO) TIMES DAILY. 180 tablet 3  . rosuvastatin (CRESTOR) 10 MG tablet TAKE 1/2 TABLET EVERY DAY 90 tablet 1   No current facility-administered medications for this visit.     Allergies  Allergen Reactions  . Iodinated Diagnostic Agents Swelling and Anaphylaxis    Throat swelling  . Latex Rash  . Codeine Nausea And Vomiting and Rash  . Lovastatin Nausea Only  . Metronidazole Nausea And Vomiting  . Sulfa Antibiotics Nausea And Vomiting and Rash      Review of Systems negative except from HPI and PMH  Physical  Exam BP 130/72   Pulse 75   Ht 5' 3.5" (1.613 m)   Wt 118 lb 9.6 oz (53.8 kg)   SpO2 96%   BMI 20.68 kg/m  Well developed and nourished in no acute distress HENT normal Neck supple with JVP-flat Clear Regular rate and rhythm, no murmurs or gallops Abd-soft with active BS No Clubbing cyanosis edema Skin-warm and dry A & Oriented  Grossly normal sensory and motor function   ECG demonstrates sinus @ 75 16/15/43 RBBB/LPFB  Assessment and  Plan  Syncope  Bifascicular block  Cryptogenic stroke  Renal insufficiency grade 4  Hypothyroidism-treated  Pacemaker-[employer]   No interval atrial fibrillation detected  Euvolemic continue current meds  No syncope       Current medicines are reviewed at length with the patient today .  The patient does not  have concerns regarding medicines.

## 2018-03-01 NOTE — Progress Notes (Signed)
Remote pacemaker transmission.   

## 2018-03-04 ENCOUNTER — Ambulatory Visit (INDEPENDENT_AMBULATORY_CARE_PROVIDER_SITE_OTHER): Payer: Medicare Other | Admitting: Endocrinology

## 2018-03-04 ENCOUNTER — Encounter: Payer: Self-pay | Admitting: Endocrinology

## 2018-03-04 VITALS — BP 142/72 | HR 72 | Ht 62.0 in | Wt 117.6 lb

## 2018-03-04 DIAGNOSIS — E7800 Pure hypercholesterolemia, unspecified: Secondary | ICD-10-CM | POA: Diagnosis not present

## 2018-03-04 DIAGNOSIS — Z0000 Encounter for general adult medical examination without abnormal findings: Secondary | ICD-10-CM | POA: Diagnosis not present

## 2018-03-04 DIAGNOSIS — M810 Age-related osteoporosis without current pathological fracture: Secondary | ICD-10-CM | POA: Diagnosis not present

## 2018-03-04 DIAGNOSIS — E039 Hypothyroidism, unspecified: Secondary | ICD-10-CM | POA: Diagnosis not present

## 2018-03-04 LAB — LIPID PANEL
CHOL/HDL RATIO: 2
CHOLESTEROL: 194 mg/dL (ref 0–200)
HDL: 98.7 mg/dL (ref >39.00)
LDL CALC: 83 mg/dL (ref 0–99)
NonHDL: 94.94
Triglycerides: 59 mg/dL (ref 0.0–149.0)
VLDL: 11.8 mg/dL (ref 0.0–40.0)

## 2018-03-04 LAB — TSH: TSH: 0.49 u[IU]/mL (ref 0.35–4.50)

## 2018-03-04 NOTE — Progress Notes (Signed)
See physical exam in progress note.

## 2018-03-04 NOTE — Progress Notes (Signed)
we discussed code status.  pt requests full code, but would not want to be started or maintained on artificial life-support measures if there was not a reasonable chance of recovery 

## 2018-03-04 NOTE — Progress Notes (Signed)
Subjective:    Patient ID: Katrina Yang, female    DOB: March 19, 1941, 77 y.o.   MRN: [id number]  HPI Pt is here for regular wellness examination, and is feeling pretty well in general, and says chronic med probs are stable, except as noted below Past Medical History:  Diagnosis Date  . Anemia   . Bifascicular block   . Carotid bruit    a.  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  . Cervical spondylosis without myelopathy 02/01/2015  . CKD (chronic kidney disease), stage IV ([place])   . Colonic polyp    hx of  . CVA (cerebral vascular accident) ([place])    hx of  . Degenerative joint disease   . Diverticulosis of colon (without mention of hemorrhage)   . History of hysterectomy   . Hypertension    a. Renal artery dopplers in 2012 with no evidence for significant renal artery stenosis.   . Occipital neuralgia of left side 08/30/2013  . Osteoporosis, unspecified   . Palpitations    a. Event monitor (8/14) with no significant abnormalities.   . Peripheral neuropathic pain   . Personal history of unspecified urinary disorder   . Presence of permanent cardiac pacemaker   . Pulmonary nodule   . Pure hypercholesterolemia   . Secondary hyperparathyroidism (of renal origin)   . Unspecified hypothyroidism     Past Surgical History:  Procedure Laterality Date  . ABDOMINAL HYSTERECTOMY    . BACK SURGERY     diskectomy L-5. Multiple back surgeries  . BASCILIC VEIN TRANSPOSITION Right 12/29/2017   Procedure: FIRST STAGE BASILIC VEIN TRANSPOSITION RIGHT ARM;  Surgeon: Angelia Mould, MD;  Location: [facility];  Service: Vascular;  Laterality: Right;  . MENISCECTOMY Left 11/14/15  . PACEMAKER IMPLANT N/A 02/23/2017   Procedure: PACEMAKER IMPLANT;  Surgeon: Evans Lance, MD;  Location: [facility];  Service: Cardiovascular;  Laterality: N/A;    Social History   Socioeconomic History  . Marital status: Married    Spouse name: [name]  . Number of children: 1  . Years of  education: 2  . Highest education level: Not on file  Occupational History  . Occupation: Retired    Fish farm manager: [employer]    Comment: Metallurgist  Social Needs  . Financial resource strain: Not on file  . Food insecurity:    Worry: Not on file    Inability: Not on file  . Transportation needs:    Medical: Not on file    Non-medical: Not on file  Tobacco Use  . Smoking status: Never Smoker  . Smokeless tobacco: Never Used  Substance and Sexual Activity  . Alcohol use: No  . Drug use: No  . Sexual activity: Not on file  Lifestyle  . Physical activity:    Days per week: Not on file    Minutes per session: Not on file  . Stress: Not on file  Relationships  . Social connections:    Talks on phone: Not on file    Gets together: Not on file    Attends religious service: Not on file    Active member of club or organization: Not on file    Attends meetings of clubs or organizations: Not on file    Relationship status: Not on file  . Intimate partner violence:    Fear of current or ex partner: Not on file    Emotionally abused: Not on file    Physically abused: Not on  file    Forced sexual activity: Not on file  Other Topics Concern  . Not on file  Social History Narrative   Pt lives at home with her spouse.   Patient drinks 1 glass of tea every other day.   Patient is right handed.    Current Outpatient Medications on File Prior to Visit  Medication Sig Dispense Refill  . amLODipine (NORVASC) 5 MG tablet Take 5 mg by mouth 2 (two) times daily.    Marland Kitchen aspirin EC 81 MG tablet Take 81 mg by mouth daily.    . calcitRIOL (ROCALTROL) 0.25 MCG capsule Take 1 capsule (0.25 mcg total) by mouth daily. 90 capsule 2  . clopidogrel (PLAVIX) 75 MG tablet TAKE 1 TABLET (75 MG TOTAL) BY MOUTH DAILY. 90 tablet 0  . gabapentin (NEURONTIN) 100 MG capsule Take 1 capsule (100 mg total) by mouth 2 (two) times daily. 180 capsule 3  . levothyroxine (SYNTHROID, LEVOTHROID) 88 MCG  tablet TAKE 1 TABLET (88 MCG TOTAL) BY MOUTH DAILY BEFORE BREAKFAST. 90 tablet 3  . metoprolol tartrate (LOPRESSOR) 25 MG tablet TAKE 1 TABLET (25 MG TOTAL) BY MOUTH 2 (TWO) TIMES DAILY. 180 tablet 3  . rosuvastatin (CRESTOR) 10 MG tablet TAKE 1/2 TABLET EVERY DAY 90 tablet 1   No current facility-administered medications on file prior to visit.     Allergies  Allergen Reactions  . Iodinated Diagnostic Agents Swelling and Anaphylaxis    Throat swelling  . Latex Rash  . Codeine Nausea And Vomiting and Rash  . Lovastatin Nausea Only  . Metronidazole Nausea And Vomiting  . Sulfa Antibiotics Nausea And Vomiting and Rash    Family History  Problem Relation Age of Onset  . Kidney cancer Father   . Heart attack Father   . Heart disease Father   . Colon cancer Mother   . Ovarian cancer Mother   . Heart disease Mother   . Heart failure Mother   . Stomach cancer Neg Hx     BP (!) 142/72 (BP Location: Left Arm, Patient Position: Sitting, Cuff Size: Normal)   Pulse 72   Ht 5\' 2"  (1.575 m)   Wt 117 lb 9.6 oz (53.3 kg)   SpO2 95%   BMI 21.51 kg/m    Review of Systems Denies fever, fatigue, diplopia, earache, chest pain, sob, anxiety, cold intolerance, BRBPR, hematuria, syncope, allergy sxs, easy bruising, and rash.  No change in chronic low back pain or easy bruising.       Objective:   Physical Exam VS: see vs page GEN: no distress HEAD: head: no deformity eyes: no periorbital swelling, no proptosis external nose and ears are normal mouth: no lesion seen NECK: supple, thyroid is not enlarged CHEST WALL: no deformity.  Pacemaker is noted.   LUNGS: clear to auscultation CV: reg rate and rhythm; soft systolic murmur ABD: abdomen is soft, nontender.  no hepatosplenomegaly.  not distended.  no hernia MUSCULOSKELETAL: muscle bulk and strength are grossly normal.  no obvious joint swelling.  gait is normal and steady.  Brace on the left knee EXTEMITIES: no deformity.  no ulcer on  the feet.  feet are of normal color and temp.  Trace bilat leg edema PULSES: dorsalis pedis intact bilat.  no carotid bruit.  Palpable thrill at the right antecubital area.  NEURO:  cn 2-12 grossly intact.   readily moves all 4's.  sensation is intact to touch on the feet.  SKIN:  Normal texture and temperature.  No rash or suspicious lesion is visible.   NODES:  None palpable at the neck PSYCH: alert, well-oriented.  Does not appear anxious nor depressed.      Assessment & Plan:  Wellness visit today, with problems stable, except as noted.  Subjective:   Patient here for Medicare annual wellness visit and management of other chronic and acute problems.     Risk factors: advanced age    22 of Physicians Providing Medical Care to Patient:  See "snapshot"   Activities of Daily Living: In your present state of health, do you have any difficulty performing the following activities (lives with husband)?:  Preparing food and eating?: No  Bathing yourself: No  Getting dressed: No  Using the toilet:No  Moving around from place to place: No  In the past year have you fallen or had a near fall?: No    Home Safety: Has smoke detector and wears seat belts. Firearms are safely stored. No excess sun exposure.    Opioid Use: none  Diet and Exercise  Current exercise habits: pt says fair.   Dietary issues discussed: pt reports a healthy diet   Depression Screen  Q1: Over the past two weeks, have you felt down, depressed or hopeless? no  Q2: Over the past two weeks, have you felt little interest or pleasure in doing things? no   The following portions of the patient's history were reviewed and updated as appropriate: allergies, current medications, past family history, past medical history, past social history, past surgical history and problem list.   Review of Systems  Denies hearing loss, and visual loss Objective:   Vision:  See [facility] done today Hearing: grossly normal Body mass  index:  See vs page Msk: pt easily and quickly performs "get-up-and-go" from a sitting position.   Cognitive Impairment Assessment: cognition, memory and judgment appear normal.  remembers 3/3 at 5 minutes.  excellent recall.  can easily read and write a sentence.  alert and oriented x 3   Assessment:   Medicare wellness utd on preventive parameters    Plan:   During the course of the visit the patient was educated and counseled about appropriate screening and preventive services including:        Fall prevention is advised today  Screening mammography is UTD Bone densitometry screening is ordered Diabetes screening is UTD Nutrition counseling is offered  advanced directives/end of life addressed today:  see healthcare directives hyperlink  Vaccines are updated as needed  Patient Instructions (the written plan) was given to the patient.

## 2018-03-04 NOTE — Patient Instructions (Addendum)
Please consider these measures for your health:  minimize alcohol.  Do not use tobacco products.  Have a colonoscopy at least every 10 years from age 76.  Women should have an annual mammogram from age 33.  Keep firearms safely stored.  Always use seat belts.  have working smoke alarms in your home.  See an eye doctor and dentist regularly.  Never drive under the influence of alcohol or drugs (including prescription drugs).  Those with fair skin should take precautions against the sun, and should carefully examine their skin once per month, for any new or changed moles. please let me know what your wishes would be, if artificial life support measures should become necessary.  It is critically important to prevent falling down (keep floor areas well-lit, dry, and free of loose objects.  If you have a cane, walker, or wheelchair, you should use it, even for short trips around the house.  Wear flat-soled shoes.  Also, try not to rush).   blood tests are requested for you today.  We'll let you know about the results.   Your blood pressure is high today.  Please follow this up with Dr Moshe Cipro. good diet and exercise significantly improves your health.  please let me know if you wish to be referred to a dietician.  high blood sugar is very risky to your health.  you should see an eye doctor and dentist every year.  It is very important to get all recommended vaccinations.  Best wishes with your new primary care provider.

## 2018-03-05 ENCOUNTER — Other Ambulatory Visit: Payer: Medicare Other

## 2018-03-10 ENCOUNTER — Encounter: Payer: Self-pay | Admitting: *Deleted

## 2018-03-10 ENCOUNTER — Ambulatory Visit (INDEPENDENT_AMBULATORY_CARE_PROVIDER_SITE_OTHER): Payer: Self-pay | Admitting: Vascular Surgery

## 2018-03-10 ENCOUNTER — Encounter: Payer: Self-pay | Admitting: Vascular Surgery

## 2018-03-10 VITALS — BP 133/67 | HR 76 | Temp 98.0°F | Resp 16 | Ht 62.0 in | Wt 118.0 lb

## 2018-03-10 DIAGNOSIS — N184 Chronic kidney disease, stage 4 (severe): Secondary | ICD-10-CM

## 2018-03-10 LAB — CUP PACEART INCLINIC DEVICE CHECK
Battery Remaining Longevity: 134 mo
Brady Statistic RA Percent Paced: 10 %
Date Time Interrogation Session: 20191028175612
Implantable Lead Location: 753859
Implantable Lead Location: 753860
Lead Channel Pacing Threshold Amplitude: 0.5 V
Lead Channel Pacing Threshold Amplitude: 0.5 V
Lead Channel Pacing Threshold Amplitude: 0.5 V
Lead Channel Pacing Threshold Pulse Width: 0.5 ms
Lead Channel Pacing Threshold Pulse Width: 0.5 ms
Lead Channel Sensing Intrinsic Amplitude: 5 mV
Lead Channel Setting Pacing Amplitude: 0.625
Lead Channel Setting Pacing Pulse Width: 0.5 ms
Lead Channel Setting Sensing Sensitivity: 2 mV
MDC IDC LEAD IMPLANT DT: 20181022
MDC IDC LEAD IMPLANT DT: 20181022
MDC IDC MSMT BATTERY VOLTAGE: 3.02 V
MDC IDC MSMT LEADCHNL RA IMPEDANCE VALUE: 412.5 Ohm
MDC IDC MSMT LEADCHNL RA PACING THRESHOLD AMPLITUDE: 0.5 V
MDC IDC MSMT LEADCHNL RV IMPEDANCE VALUE: 675 Ohm
MDC IDC MSMT LEADCHNL RV PACING THRESHOLD PULSEWIDTH: 0.5 ms
MDC IDC MSMT LEADCHNL RV PACING THRESHOLD PULSEWIDTH: 0.5 ms
MDC IDC MSMT LEADCHNL RV SENSING INTR AMPL: 9.3 mV
MDC IDC PG IMPLANT DT: 20181022
MDC IDC SET LEADCHNL RA PACING AMPLITUDE: 2 V
MDC IDC STAT BRADY RV PERCENT PACED: 0.05 %
Pulse Gen Model: 2272
Pulse Gen Serial Number: 8958692

## 2018-03-10 NOTE — Progress Notes (Signed)
   Patient name: Katrina Yang MRN: [id number] DOB: Mar 05, 1941 Sex: female  REASON FOR VISIT:   Follow-up of AV fistula.  HPI:   Katrina Yang is a pleasant 77 y.o. female who had a first stage right basilic vein transposition on 12/29/2017.   The patient had a venous duplex scan on 02/10/2018 which showed good flow in the AV fistula.  Diameters of the fistula ranged from 0.74-0.98 cm.  For some reason she is just returning for follow-up now.  She is not on dialysis.  She does not have any specific complaints.  She denies pain or paresthesias in her right arm.  Current Outpatient Medications  Medication Sig Dispense Refill  . amLODipine (NORVASC) 5 MG tablet Take 5 mg by mouth 2 (two) times daily.    Marland Kitchen aspirin EC 81 MG tablet Take 81 mg by mouth daily.    . calcitRIOL (ROCALTROL) 0.25 MCG capsule Take 1 capsule (0.25 mcg total) by mouth daily. 90 capsule 2  . clopidogrel (PLAVIX) 75 MG tablet TAKE 1 TABLET (75 MG TOTAL) BY MOUTH DAILY. 90 tablet 0  . gabapentin (NEURONTIN) 100 MG capsule Take 1 capsule (100 mg total) by mouth 2 (two) times daily. 180 capsule 3  . levothyroxine (SYNTHROID, LEVOTHROID) 88 MCG tablet TAKE 1 TABLET (88 MCG TOTAL) BY MOUTH DAILY BEFORE BREAKFAST. 90 tablet 3  . metoprolol tartrate (LOPRESSOR) 25 MG tablet TAKE 1 TABLET (25 MG TOTAL) BY MOUTH 2 (TWO) TIMES DAILY. 180 tablet 3  . rosuvastatin (CRESTOR) 10 MG tablet TAKE 1/2 TABLET EVERY DAY 90 tablet 1   No current facility-administered medications for this visit.     REVIEW OF SYSTEMS:  [X]  denotes positive finding, [ ]  denotes negative finding Vascular    Leg swelling    Cardiac    Chest pain or chest pressure:    Shortness of breath upon exertion:    Short of breath when lying flat:    Irregular heart rhythm:    Constitutional    Fever or chills:     PHYSICAL EXAM:   Vitals:   03/10/18 1159  BP: 133/67  Pulse: 76  Resp: 16  Temp: 98 F (36.7 C)  SpO2: 100%  Weight: 118 lb (53.5 kg)    Height: 5\' 2"  (1.575 m)    GENERAL: The patient is a well-nourished female, in no acute distress. The vital signs are documented above. CARDIOVASCULAR: There is a regular rate and rhythm. PULMONARY: There is good air exchange bilaterally without wheezing or rales. VASCULAR: She has an excellent thrill in her right stage basilic vein transposition.  She has a palpable right radial pulse and radial pulse.  DATA:   No new data  MEDICAL ISSUES:   STAGE IV CHRONIC KIDNEY DISEASE: The patient is undergone a first stage basilic vein transposition and the vein appears to be maturing adequately.  I recommend that we proceed with a second stage basilic vein transposition.  She is having some issues with her husband's health and therefore would like to wait until December.  We have tentatively scheduled her procedure for 04/13/2018.  I have discussed the indications for the procedure and the potential palpitations and she is agreeable to proceed.  Deitra Mayo [facility] [phone]

## 2018-03-18 ENCOUNTER — Ambulatory Visit: Payer: Medicare Other | Admitting: Neurology

## 2018-03-18 ENCOUNTER — Encounter: Payer: Self-pay | Admitting: Neurology

## 2018-03-18 VITALS — BP 155/71 | HR 73 | Ht 62.0 in | Wt 119.5 lb

## 2018-03-18 DIAGNOSIS — M5481 Occipital neuralgia: Secondary | ICD-10-CM | POA: Diagnosis not present

## 2018-03-18 NOTE — Patient Instructions (Signed)
Increase the gabapentin to 100 mg twice a day.

## 2018-03-18 NOTE — Progress Notes (Signed)
Reason for visit: Left occipital neuralgia  Katrina Yang is an 77 y.o. female  History of present illness:  Katrina Yang is a 77 year old right-handed white female with a history of chronic renal insufficiency.  The patient is not yet on hemodialysis but she does have a permanent access placed.  The patient is still having some issues with the left occipital neuralgia but she is not taking the prescribed dose of her gabapentin.  She is only taking 100 mg daily, she will take 200 mg day if she is having increased discomfort.  The patient indicates that the pain is oftentimes is worse in the evening hours.  She returns this office for an evaluation.  She has not had any problems with falls, she is usually sleeping fairly well at night.  Past Medical History:  Diagnosis Date  . Anemia   . Bifascicular block   . Carotid bruit    a.  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  . Cervical spondylosis without myelopathy 02/01/2015  . CKD (chronic kidney disease), stage IV ([place])   . Colonic polyp    hx of  . CVA (cerebral vascular accident) ([place])    hx of  . Degenerative joint disease   . Diverticulosis of colon (without mention of hemorrhage)   . History of hysterectomy   . Hypertension    a. Renal artery dopplers in 2012 with no evidence for significant renal artery stenosis.   . Occipital neuralgia of left side 08/30/2013  . Osteoporosis, unspecified   . Palpitations    a. Event monitor (8/14) with no significant abnormalities.   . Peripheral neuropathic pain   . Personal history of unspecified urinary disorder   . Presence of permanent cardiac pacemaker   . Pulmonary nodule   . Pure hypercholesterolemia   . Secondary hyperparathyroidism (of renal origin)   . Unspecified hypothyroidism     Past Surgical History:  Procedure Laterality Date  . ABDOMINAL HYSTERECTOMY    . BACK SURGERY     diskectomy L-5. Multiple back surgeries  . BASCILIC VEIN TRANSPOSITION Right 12/29/2017   Procedure: FIRST STAGE BASILIC VEIN TRANSPOSITION RIGHT ARM;  Surgeon: Angelia Mould, MD;  Location: [facility];  Service: Vascular;  Laterality: Right;  . MENISCECTOMY Left 11/14/15  . PACEMAKER IMPLANT N/A 02/23/2017   Procedure: PACEMAKER IMPLANT;  Surgeon: Evans Lance, MD;  Location: [facility];  Service: Cardiovascular;  Laterality: N/A;    Family History  Problem Relation Age of Onset  . Kidney cancer Father   . Heart attack Father   . Heart disease Father   . Colon cancer Mother   . Ovarian cancer Mother   . Heart disease Mother   . Heart failure Mother   . Stomach cancer Neg Hx     Social history:  reports that she has never smoked. She has never used smokeless tobacco. She reports that she does not drink alcohol or use drugs.    Allergies  Allergen Reactions  . Iodinated Diagnostic Agents Swelling and Anaphylaxis    Throat swelling  . Latex Rash  . Codeine Nausea And Vomiting and Rash  . Lovastatin Nausea Only  . Metronidazole Nausea And Vomiting  . Sulfa Antibiotics Nausea And Vomiting and Rash    Medications:  Prior to Admission medications   Medication Sig Start Date End Date Taking? Authorizing Provider  amLODipine (NORVASC) 5 MG tablet Take 5 mg by mouth 2 (two) times daily.   Yes [provider]  aspirin EC 81 MG tablet Take 81 mg by mouth daily.   Yes [provider]  calcitRIOL (ROCALTROL) 0.25 MCG capsule Take 1 capsule (0.25 mcg total) by mouth daily. 07/08/17  Yes Renato Shin, MD  clopidogrel (PLAVIX) 75 MG tablet TAKE 1 TABLET (75 MG TOTAL) BY MOUTH DAILY. 12/31/17  Yes Kathrynn Ducking, MD  gabapentin (NEURONTIN) 100 MG capsule Take 1 capsule (100 mg total) by mouth 2 (two) times daily. 07/01/17  Yes Kathrynn Ducking, MD  levothyroxine (SYNTHROID, LEVOTHROID) 88 MCG tablet TAKE 1 TABLET (88 MCG TOTAL) BY MOUTH DAILY BEFORE BREAKFAST. 08/17/17  Yes Renato Shin, MD  metoprolol tartrate (LOPRESSOR) 25 MG tablet TAKE 1  TABLET (25 MG TOTAL) BY MOUTH 2 (TWO) TIMES DAILY. 09/12/17  Yes Renato Shin, MD  rosuvastatin (CRESTOR) 10 MG tablet TAKE 1/2 TABLET EVERY DAY 07/10/17  Yes Renato Shin, MD    ROS:  Out of a complete 14 system review of symptoms, the patient complains only of the following symptoms, and all other reviewed systems are negative.  Joint pain, back pain Numbness, weakness  Blood pressure (!) 155/71, pulse 73, height 5\' 2"  (1.575 m), weight 119 lb 8 oz (54.2 kg).  Physical Exam  General: The patient is alert and cooperative at the time of the examination.  The patient is moderately obese.  Neuromuscular: Range move the cervical spine is relatively full.  Skin: No significant peripheral edema is noted.   Neurologic Exam  Mental status: The patient is alert and oriented x 3 at the time of the examination. The patient has apparent normal recent and remote memory, with an apparently normal attention span and concentration ability.   Cranial nerves: Facial symmetry is present. Speech is normal, no aphasia or dysarthria is noted. Extraocular movements are full. Visual fields are full.  Motor: The patient has good strength in all 4 extremities.  Sensory examination: Soft touch sensation is symmetric on the face, arms, and legs.  Coordination: The patient has good finger-nose-finger and heel-to-shin bilaterally.  Gait and station: The patient has a normal gait. Tandem gait is slightly unsteady. Romberg is negative. No drift is seen.  Reflexes: Deep tendon reflexes are symmetric.   CT head 07/10/17:  IMPRESSION: This CT scan of the head without contrast shows the following: 1. Age appropriate mild generalized cortical atrophy and mild chronic microvascular ischemic changes, stable when compared to the previous CT scan. 2. There are no acute findings.   * CT scan images were reviewed online. I agree with the written report.    Assessment/Plan:  1.  Left occipital  neuralgia  The patient is to increase the gabapentin dose to 100 mg twice daily.  If this is not effective in the future, we may be able to go to 100 mg in the morning and 200 mg in the evening.  She cannot increase the dose higher than this secondary to her renal insufficiency.  She will follow-up in 6 months.  Jill Alexanders MD 03/18/2018 2:48 PM  [facility] [address] [place], [place] [address]  Phone [phone] Fax [phone]

## 2018-03-24 ENCOUNTER — Other Ambulatory Visit: Payer: Self-pay | Admitting: *Deleted

## 2018-03-30 ENCOUNTER — Telehealth: Payer: Self-pay

## 2018-03-30 NOTE — Telephone Encounter (Signed)
Returned patient's phone call regarding holding Plavix prior to her surgery on 12/10. I instructed her to stop taking Plavix 3 days prior to surgery. Pt. Verbalized understanding. No further questions at this time.

## 2018-03-31 ENCOUNTER — Other Ambulatory Visit: Payer: Self-pay | Admitting: Neurology

## 2018-04-10 ENCOUNTER — Encounter (HOSPITAL_COMMUNITY): Payer: Self-pay | Admitting: *Deleted

## 2018-04-10 ENCOUNTER — Other Ambulatory Visit: Payer: Self-pay

## 2018-04-10 NOTE — Progress Notes (Signed)
Pt with [employer] pacemaker--prescription for implanted cardiac device programming faxed to [facility].  Pt confirmed she stopped Plavix, last dose was 04-09-18 in the morning. Verbalizes understanding of arrival time, NPO after MN, meds to take DOS with a sip of water, No NSAIDs.   LOV with Dr. Caryl Comes 03-01-18 Last Device check: 03-01-18

## 2018-04-12 NOTE — Anesthesia Preprocedure Evaluation (Addendum)
Anesthesia Evaluation  Patient identified by MRN, date of birth, ID band Patient awake    Reviewed: Allergy & Precautions, NPO status , Patient's Chart, lab work & pertinent test results, reviewed documented beta blocker date and time   History of Anesthesia Complications Negative for: history of anesthetic complications  Airway Mallampati: II  TM Distance: >3 FB Neck ROM: Full    Dental no notable dental hx. (+) Dental Advisory Given   Pulmonary neg pulmonary ROS,    Pulmonary exam normal        Cardiovascular hypertension, Pt. on home beta blockers and Pt. on medications Normal cardiovascular exam+ dysrhythmias + pacemaker   Echo 02/23/17: Study Conclusions - Left ventricle: The cavity size was normal. Wall thickness was normal. Systolic function was normal. The estimated ejection fraction was in the range of 60% to 65%. Wall motion was normal; there were no regional wall motion abnormalities. Doppler parameters are consistent with abnormal left ventricular relaxation (grade 1 diastolic dysfunction). - Aortic valve: There was no stenosis. - Mitral valve: There was no significant regurgitation. - Right ventricle: The cavity size was normal. Systolic function was normal. - Pulmonary arteries: No complete TR doppler jet so unable to estimate PA systolic pressure. - Inferior vena cava: The vessel was normal in size. The respirophasic diameter changes were in the normal range (>= 50%), consistent with normal central venous pressure. Impressions: - Normal LV size and systolic function, EF 82-95%. Normal RV size and systolic function. No significant valvular abnormalities.   Neuro/Psych CVA negative psych ROS   GI/Hepatic Neg liver ROS, GERD  ,  Endo/Other  Hypothyroidism   Renal/GU Renal InsufficiencyRenal disease  negative genitourinary   Musculoskeletal negative musculoskeletal ROS (+)    Abdominal   Peds negative pediatric ROS (+)  Hematology negative hematology ROS (+) anemia ,   Anesthesia Other Findings   Reproductive/Obstetrics negative OB ROS                                                             Anesthesia Evaluation  Patient identified by MRN, date of birth, ID band Patient awake    Reviewed: Allergy & Precautions, H&P , NPO status , Patient's Chart, lab work & pertinent test results  Airway Mallampati: II   Neck ROM: full    Dental   Pulmonary neg pulmonary ROS,    breath sounds clear to auscultation       Cardiovascular hypertension, + Peripheral Vascular Disease  + dysrhythmias + pacemaker  Rhythm:regular Rate:Normal     Neuro/Psych  Neuromuscular disease CVA    GI/Hepatic GERD  ,  Endo/Other  Hypothyroidism   Renal/GU ESRFRenal disease     Musculoskeletal  (+) Arthritis ,   Abdominal   Peds  Hematology   Anesthesia Other Findings   Reproductive/Obstetrics                             Anesthesia Physical Anesthesia Plan  ASA: III  Anesthesia Plan: MAC   Post-op Pain Management:    Induction: Intravenous  PONV Risk Score and Plan: 2 and Ondansetron, Propofol infusion and Treatment may vary due to age or medical condition  Airway Management Planned: Simple Face Mask  Additional Equipment:   Intra-op Plan:  Post-operative Plan:   Informed Consent: I have reviewed the patients History and Physical, chart, labs and discussed the procedure including the risks, benefits and alternatives for the proposed anesthesia with the patient or authorized representative who has indicated his/her understanding and acceptance.     Plan Discussed with: CRNA, Anesthesiologist and Surgeon  Anesthesia Plan Comments:         Anesthesia Quick Evaluation  Anesthesia Physical Anesthesia Plan  ASA: III  Anesthesia Plan: General   Post-op Pain Management:     Induction: Intravenous  PONV Risk Score and Plan: 3 and Ondansetron, Dexamethasone and Diphenhydramine  Airway Management Planned: LMA  Additional Equipment:   Intra-op Plan:   Post-operative Plan: Extubation in OR  Informed Consent: I have reviewed the patients History and Physical, chart, labs and discussed the procedure including the risks, benefits and alternatives for the proposed anesthesia with the patient or authorized representative who has indicated his/her understanding and acceptance.   Dental advisory given  Plan Discussed with:   Anesthesia Plan Comments: (PAT note written 04/12/2018 by Myra Gianotti, PA-C. )       Anesthesia Quick Evaluation

## 2018-04-12 NOTE — Progress Notes (Signed)
Anesthesia Chart Review: Katrina Yang   Case:  [id number] Date/Time:  04/13/18 0915   Procedure:  BASILIC VEIN TRANSPOSITION SECOND STAGE (Right )   Anesthesia type:  General   Pre-op diagnosis:  chronic kidney disease   Location:  [facility] OR ROOM 11 / [facility]   Surgeon:  Angelia Mould, MD      DISCUSSION:  Patient is a 77 year old female scheduled for the above procedure.   History includes never smoker, HTN, palpitations, hypercholesterolemia, CVA (on ASA/Plavix), secondary hyperparathyroidism, CKD stage IV (s/p fir stage right basilic vein transposition 12/29/17), carotid bruit (1-39% BICA '18), bifascicular block with syncope (s/p [employer] 2272 [employer]urity MRI PPM, left 02/23/17).   Plavix is on hold for surgery (last dose 04/09/18).   If no acute changes then I anticipate that she can proceed as planned.  PPM perioperative device form prescription for [facility] is still pending.   VS:  For day of surgery.  PROVIDERS: Abelino Derrick, MD is PCP. Established care 02/23/18. Renato Shin, MD is endocrinologist. Last visit 03/04/18. Kathrynn Ducking, MD is neurologist. Last visit 03/18/18 for follow-up left occipital neuralgia, headaches, cervical spondylosis. Six month follow-up recommended. Virl Axe, MD is EP. Last visit 03/01/18. No afib detected. No syncope. Continue current treatment recommended.   LABS: For labs on the day of surgery.    IMAGES: CXR 08/31/17: IMPRESSION: No active cardiopulmonary disease. Aortic Atherosclerosis (ICD10-I70.0).   EKG: 03/01/18: NSR, RBBB/LPFB.    CV: Echo 02/23/17: Study Conclusions - Left ventricle: The cavity size was normal. Wall thickness was normal. Systolic function was normal. The estimated ejection fraction was in the range of 60% to 65%. Wall motion was normal; there were no regional wall motion abnormalities. Doppler parameters are consistent with abnormal left ventricular relaxation  (grade 1 diastolic dysfunction). - Aortic valve: There was no stenosis. - Mitral valve: There was no significant regurgitation. - Right ventricle: The cavity size was normal. Systolic function was normal. - Pulmonary arteries: No complete TR doppler jet so unable to estimate PA systolic pressure. - Inferior vena cava: The vessel was normal in size. The respirophasic diameter changes were in the normal range (>= 50%), consistent with normal central venous pressure. Impressions: - Normal LV size and systolic function, EF 66-59%. Normal RV size and systolic function. No significant valvular abnormalities.  Carotid U/S 02/23/17: Summary: Bilateral: mixed plaque origin ICA. 1-39% ICA plaquing. Vertebral artery flow is antegrade.  Event monitor 12/30/12: SR  Low risk stress test in 2007.   Past Medical History:  Diagnosis Date  . Anemia   . Bifascicular block   . Carotid bruit    a.  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  . Cervical spondylosis without myelopathy 02/01/2015  . CKD (chronic kidney disease), stage IV ([place])   . Colonic polyp    hx of  . CVA (cerebral vascular accident) ([place])    hx of  . Degenerative joint disease   . Diverticulosis of colon (without mention of hemorrhage)   . History of hysterectomy   . Hypertension    a. Renal artery dopplers in 2012 with no evidence for significant renal artery stenosis.   . Occipital neuralgia of left side 08/30/2013  . Osteoporosis, unspecified   . Palpitations    a. Event monitor (8/14) with no significant abnormalities.   . Peripheral neuropathic pain   . Personal history of unspecified urinary disorder   . Presence of permanent cardiac pacemaker   . Pulmonary nodule   .  Pure hypercholesterolemia   . Secondary hyperparathyroidism (of renal origin)   . Unspecified hypothyroidism     Past Surgical History:  Procedure Laterality Date  . ABDOMINAL HYSTERECTOMY    . BACK SURGERY     diskectomy L-5.  Multiple back surgeries  . BASCILIC VEIN TRANSPOSITION Right 12/29/2017   Procedure: FIRST STAGE BASILIC VEIN TRANSPOSITION RIGHT ARM;  Surgeon: Angelia Mould, MD;  Location: [facility];  Service: Vascular;  Laterality: Right;  . MENISCECTOMY Left 11/14/15  . PACEMAKER IMPLANT N/A 02/23/2017   Procedure: PACEMAKER IMPLANT;  Surgeon: Evans Lance, MD;  Location: [facility];  Service: Cardiovascular;  Laterality: N/A;    MEDICATIONS: No current facility-administered medications for this encounter.    Marland Kitchen amLODipine (NORVASC) 5 MG tablet  . aspirin EC 81 MG tablet  . calcitRIOL (ROCALTROL) 0.25 MCG capsule  . clopidogrel (PLAVIX) 75 MG tablet  . gabapentin (NEURONTIN) 100 MG capsule  . levothyroxine (SYNTHROID, LEVOTHROID) 88 MCG tablet  . metoprolol tartrate (LOPRESSOR) 25 MG tablet  . rosuvastatin (CRESTOR) 10 MG tablet    [facility] Stay [facility]/Anesthesiology Phone [phone] 04/12/2018 9:42 AM

## 2018-04-13 ENCOUNTER — Encounter (HOSPITAL_COMMUNITY): Payer: Self-pay | Admitting: *Deleted

## 2018-04-13 ENCOUNTER — Encounter (HOSPITAL_COMMUNITY)
Admission: RE | Disposition: A | Discharge status: Home / Self Care | Payer: Self-pay | Source: Ambulatory Visit | Attending: Vascular Surgery

## 2018-04-13 ENCOUNTER — Other Ambulatory Visit: Payer: Self-pay

## 2018-04-13 ENCOUNTER — Ambulatory Visit (HOSPITAL_COMMUNITY): Payer: Medicare Other | Admitting: Vascular Surgery

## 2018-04-13 ENCOUNTER — Ambulatory Visit (HOSPITAL_COMMUNITY)
Admission: RE | Admit: 2018-04-13 | Discharge: 2018-04-13 | Disposition: A | Discharge status: Home / Self Care | Payer: Medicare Other | Source: Ambulatory Visit | Attending: Vascular Surgery | Admitting: Vascular Surgery

## 2018-04-13 ENCOUNTER — Telehealth: Payer: Self-pay | Admitting: Vascular Surgery

## 2018-04-13 DIAGNOSIS — N2581 Secondary hyperparathyroidism of renal origin: Secondary | ICD-10-CM | POA: Insufficient documentation

## 2018-04-13 DIAGNOSIS — E7800 Pure hypercholesterolemia, unspecified: Secondary | ICD-10-CM | POA: Insufficient documentation

## 2018-04-13 DIAGNOSIS — Z7982 Long term (current) use of aspirin: Secondary | ICD-10-CM | POA: Insufficient documentation

## 2018-04-13 DIAGNOSIS — Z8673 Personal history of transient ischemic attack (TIA), and cerebral infarction without residual deficits: Secondary | ICD-10-CM | POA: Diagnosis not present

## 2018-04-13 DIAGNOSIS — M810 Age-related osteoporosis without current pathological fracture: Secondary | ICD-10-CM | POA: Insufficient documentation

## 2018-04-13 DIAGNOSIS — Z950 Presence of cardiac pacemaker: Secondary | ICD-10-CM | POA: Insufficient documentation

## 2018-04-13 DIAGNOSIS — E039 Hypothyroidism, unspecified: Secondary | ICD-10-CM | POA: Diagnosis not present

## 2018-04-13 DIAGNOSIS — I129 Hypertensive chronic kidney disease with stage 1 through stage 4 chronic kidney disease, or unspecified chronic kidney disease: Secondary | ICD-10-CM | POA: Diagnosis not present

## 2018-04-13 DIAGNOSIS — N184 Chronic kidney disease, stage 4 (severe): Secondary | ICD-10-CM | POA: Insufficient documentation

## 2018-04-13 DIAGNOSIS — Z7902 Long term (current) use of antithrombotics/antiplatelets: Secondary | ICD-10-CM | POA: Insufficient documentation

## 2018-04-13 DIAGNOSIS — I700 Atherosclerosis of aorta: Secondary | ICD-10-CM | POA: Insufficient documentation

## 2018-04-13 HISTORY — PX: BASCILIC VEIN TRANSPOSITION: SHX5742

## 2018-04-13 LAB — POCT I-STAT 4, (NA,K, GLUC, HGB,HCT)
Glucose, Bld: 90 mg/dL (ref 70–99)
HCT: 31 % — ABNORMAL LOW (ref 36.0–46.0)
Hemoglobin: 10.5 g/dL — ABNORMAL LOW (ref 12.0–15.0)
Potassium: 4.6 mmol/L (ref 3.5–5.1)
SODIUM: 140 mmol/L (ref 135–145)

## 2018-04-13 SURGERY — TRANSPOSITION, VEIN, BASILIC
Anesthesia: General | Site: Arm Upper | Laterality: Right

## 2018-04-13 MED ORDER — ONDANSETRON HCL 4 MG/2ML IJ SOLN
INTRAMUSCULAR | Status: AC
  Filled 2018-04-13: qty 2

## 2018-04-13 MED ORDER — PROMETHAZINE HCL 25 MG/ML IJ SOLN: 6.2500 mg | INTRAMUSCULAR | Status: DC | PRN

## 2018-04-13 MED ORDER — 0.9 % SODIUM CHLORIDE (POUR BTL) OPTIME
TOPICAL | Status: DC | PRN
  Administered 2018-04-13: 1000 mL

## 2018-04-13 MED ORDER — LIDOCAINE HCL (PF) 1 % IJ SOLN
INTRAMUSCULAR | Status: AC
  Filled 2018-04-13: qty 30

## 2018-04-13 MED ORDER — DEXAMETHASONE SODIUM PHOSPHATE 10 MG/ML IJ SOLN
INTRAMUSCULAR | Status: AC
  Filled 2018-04-13: qty 1

## 2018-04-13 MED ORDER — TRAMADOL HCL 50 MG PO TABS: 50.0000 mg | ORAL_TABLET | Freq: Four times a day (QID) | ORAL | 0 refills | Status: DC | PRN

## 2018-04-13 MED ORDER — LIDOCAINE HCL (CARDIAC) PF 100 MG/5ML IV SOSY
PREFILLED_SYRINGE | INTRAVENOUS | Status: DC | PRN
  Administered 2018-04-13: 60 mg via INTRAVENOUS

## 2018-04-13 MED ORDER — ALBUMIN HUMAN 5 % IV SOLN
INTRAVENOUS | Status: AC
  Filled 2018-04-13: qty 250

## 2018-04-13 MED ORDER — PROPOFOL 10 MG/ML IV BOLUS
INTRAVENOUS | Status: DC | PRN
  Administered 2018-04-13: 140 mg via INTRAVENOUS

## 2018-04-13 MED ORDER — LIDOCAINE 2% (20 MG/ML) 5 ML SYRINGE
INTRAMUSCULAR | Status: AC
  Filled 2018-04-13: qty 5

## 2018-04-13 MED ORDER — HYDROMORPHONE HCL 1 MG/ML IJ SOLN
INTRAMUSCULAR | Status: AC
  Filled 2018-04-13: qty 1

## 2018-04-13 MED ORDER — PROTAMINE SULFATE 10 MG/ML IV SOLN
INTRAVENOUS | Status: AC
  Filled 2018-04-13: qty 5

## 2018-04-13 MED ORDER — HEPARIN SODIUM (PORCINE) 1000 UNIT/ML IJ SOLN
INTRAMUSCULAR | Status: AC
  Filled 2018-04-13: qty 1

## 2018-04-13 MED ORDER — HEPARIN SODIUM (PORCINE) 1000 UNIT/ML IJ SOLN
INTRAMUSCULAR | Status: DC | PRN
  Administered 2018-04-13: 5000 [IU] via INTRAVENOUS

## 2018-04-13 MED ORDER — ONDANSETRON HCL 4 MG/2ML IJ SOLN
INTRAMUSCULAR | Status: DC | PRN
  Administered 2018-04-13: 4 mg via INTRAVENOUS

## 2018-04-13 MED ORDER — SODIUM CHLORIDE 0.9 % IV SOLN
INTRAVENOUS | Status: DC
  Administered 2018-04-13: 08:00:00 via INTRAVENOUS

## 2018-04-13 MED ORDER — PHENYLEPHRINE 40 MCG/ML (10ML) SYRINGE FOR IV PUSH (FOR BLOOD PRESSURE SUPPORT)
PREFILLED_SYRINGE | INTRAVENOUS | Status: AC
  Filled 2018-04-13: qty 10

## 2018-04-13 MED ORDER — SODIUM CHLORIDE 0.9 % IV SOLN
INTRAVENOUS | Status: DC | PRN
  Administered 2018-04-13: 09:00:00

## 2018-04-13 MED ORDER — CEFAZOLIN SODIUM-DEXTROSE 2-4 GM/100ML-% IV SOLN
2.0000 g | INTRAVENOUS | Status: AC
  Administered 2018-04-13: 2 g via INTRAVENOUS
  Filled 2018-04-13: qty 100

## 2018-04-13 MED ORDER — FENTANYL CITRATE (PF) 250 MCG/5ML IJ SOLN
INTRAMUSCULAR | Status: AC
  Filled 2018-04-13: qty 5

## 2018-04-13 MED ORDER — SODIUM CHLORIDE 0.9 % IV SOLN
INTRAVENOUS | Status: DC | PRN
  Administered 2018-04-13: 25 ug/min via INTRAVENOUS

## 2018-04-13 MED ORDER — PHENYLEPHRINE 40 MCG/ML (10ML) SYRINGE FOR IV PUSH (FOR BLOOD PRESSURE SUPPORT)
PREFILLED_SYRINGE | INTRAVENOUS | Status: DC | PRN
  Administered 2018-04-13: 120 ug via INTRAVENOUS
  Administered 2018-04-13: 80 ug via INTRAVENOUS
  Administered 2018-04-13: 160 ug via INTRAVENOUS

## 2018-04-13 MED ORDER — ALBUMIN HUMAN 5 % IV SOLN
12.5000 g | Freq: Once | INTRAVENOUS | Status: AC
  Administered 2018-04-13: 12.5 g via INTRAVENOUS

## 2018-04-13 MED ORDER — FENTANYL CITRATE (PF) 250 MCG/5ML IJ SOLN
INTRAMUSCULAR | Status: DC | PRN
  Administered 2018-04-13: 100 ug via INTRAVENOUS

## 2018-04-13 MED ORDER — HYDROMORPHONE HCL 1 MG/ML IJ SOLN
0.2500 mg | INTRAMUSCULAR | Status: DC | PRN
  Administered 2018-04-13 (×2): 0.5 mg via INTRAVENOUS

## 2018-04-13 MED ORDER — PROTAMINE SULFATE 10 MG/ML IV SOLN
INTRAVENOUS | Status: DC | PRN
  Administered 2018-04-13: 30 mg via INTRAVENOUS
  Administered 2018-04-13: 10 mg via INTRAVENOUS

## 2018-04-13 MED ORDER — SODIUM CHLORIDE 0.9 % IV SOLN
INTRAVENOUS | Status: AC
  Filled 2018-04-13: qty 1.2

## 2018-04-13 MED ORDER — DEXAMETHASONE SODIUM PHOSPHATE 10 MG/ML IJ SOLN
INTRAMUSCULAR | Status: DC | PRN
  Administered 2018-04-13: 10 mg via INTRAVENOUS

## 2018-04-13 SURGICAL SUPPLY — 45 items
ADH SKN CLS APL DERMABOND .7 (GAUZE/BANDAGES/DRESSINGS) ×1
ARMBAND PINK RESTRICT EXTREMIT (MISCELLANEOUS) ×3 IMPLANT
BANDAGE ACE 4X5 VEL STRL LF (GAUZE/BANDAGES/DRESSINGS) ×2 IMPLANT
BNDG GAUZE ELAST 4 BULKY (GAUZE/BANDAGES/DRESSINGS) ×2 IMPLANT
CANISTER SUCT 3000ML PPV (MISCELLANEOUS) ×3 IMPLANT
CANNULA VESSEL 3MM 2 BLNT TIP (CANNULA) ×2 IMPLANT
CLIP VESOCCLUDE MED 24/CT (CLIP) ×3 IMPLANT
CLIP VESOCCLUDE SM WIDE 24/CT (CLIP) ×3 IMPLANT
COVER PROBE W GEL 5X96 (DRAPES) IMPLANT
COVER WAND RF STERILE (DRAPES) ×3 IMPLANT
DECANTER SPIKE VIAL GLASS SM (MISCELLANEOUS) ×3 IMPLANT
DERMABOND ADVANCED (GAUZE/BANDAGES/DRESSINGS) ×2
DERMABOND ADVANCED .7 DNX12 (GAUZE/BANDAGES/DRESSINGS) ×1 IMPLANT
ELECT REM PT RETURN 9FT ADLT (ELECTROSURGICAL) ×3
ELECTRODE REM PT RTRN 9FT ADLT (ELECTROSURGICAL) ×1 IMPLANT
GAUZE 4X4 16PLY RFD (DISPOSABLE) ×2 IMPLANT
GAUZE SPONGE 4X4 12PLY STRL (GAUZE/BANDAGES/DRESSINGS) ×2 IMPLANT
GLOVE BIOGEL PI IND STRL 6.5 (GLOVE) IMPLANT
GLOVE BIOGEL PI IND STRL 7.0 (GLOVE) IMPLANT
GLOVE BIOGEL PI IND STRL 7.5 (GLOVE) IMPLANT
GLOVE BIOGEL PI IND STRL 8 (GLOVE) ×1 IMPLANT
GLOVE BIOGEL PI INDICATOR 6.5 (GLOVE) ×2
GLOVE BIOGEL PI INDICATOR 7.0 (GLOVE) ×2
GLOVE BIOGEL PI INDICATOR 7.5 (GLOVE) ×2
GLOVE BIOGEL PI INDICATOR 8 (GLOVE) ×2
GLOVE SURG SS PI 6.5 STRL IVOR (GLOVE) ×2 IMPLANT
GLOVE SURG SS PI 7.0 STRL IVOR (GLOVE) ×2 IMPLANT
GLOVE SURG SS PI 7.5 STRL IVOR (GLOVE) ×2 IMPLANT
GOWN STRL REUS W/ TWL LRG LVL3 (GOWN DISPOSABLE) ×3 IMPLANT
GOWN STRL REUS W/TWL LRG LVL3 (GOWN DISPOSABLE) ×9
KIT BASIN OR (CUSTOM PROCEDURE TRAY) ×3 IMPLANT
KIT TURNOVER KIT B (KITS) ×3 IMPLANT
NS IRRIG 1000ML POUR BTL (IV SOLUTION) ×3 IMPLANT
PACK CV ACCESS (CUSTOM PROCEDURE TRAY) ×3 IMPLANT
PAD ARMBOARD 7.5X6 YLW CONV (MISCELLANEOUS) ×6 IMPLANT
SPONGE LAP 18X18 RF (DISPOSABLE) ×2 IMPLANT
SPONGE SURGIFOAM ABS GEL 100 (HEMOSTASIS) IMPLANT
SUT PROLENE 6 0 BV (SUTURE) ×11 IMPLANT
SUT SILK 2 0 SH (SUTURE) ×6 IMPLANT
SUT VIC AB 3-0 SH 27 (SUTURE) ×3
SUT VIC AB 3-0 SH 27X BRD (SUTURE) ×1 IMPLANT
SUT VICRYL 4-0 PS2 18IN ABS (SUTURE) ×7 IMPLANT
TOWEL GREEN STERILE (TOWEL DISPOSABLE) ×3 IMPLANT
UNDERPAD 30X30 (UNDERPADS AND DIAPERS) ×3 IMPLANT
WATER STERILE IRR 1000ML POUR (IV SOLUTION) ×3 IMPLANT

## 2018-04-13 NOTE — Transfer of Care (Signed)
Immediate Anesthesia Transfer of Care Note  Patient: Katrina Yang  Procedure(s) Performed: BASILIC VEIN TRANSPOSITION SECOND STAGE (Right Arm Upper)  Patient Location: PACU  Anesthesia Type:General  Level of Consciousness: awake, alert , oriented and patient cooperative  Airway & Oxygen Therapy: Patient Spontanous Breathing and Patient connected to nasal cannula oxygen  Post-op Assessment: Report given to RN, Post -op Vital signs reviewed and stable and Patient moving all extremities  Post vital signs: Reviewed and stable  Last Vitals:  Vitals Value Taken Time  BP    Temp    Pulse 70 04/13/2018 11:55 AM  Resp 10 04/13/2018 11:55 AM  SpO2 99 % 04/13/2018 11:55 AM  Vitals shown include unvalidated device data.  Last Pain:  Vitals:   04/13/18 0735  TempSrc:   PainSc: 0-No pain      Patients Stated Pain Goal: 0 (81/27/51 7001)  Complications: No apparent anesthesia complications

## 2018-04-13 NOTE — Discharge Instructions (Signed)
° °  [facility] ° °Discharge Instructions ° °AV Fistula or Graft Surgery for Dialysis Access ° °Please refer to the following instructions for your post-procedure care. Your surgeon or physician assistant will discuss any changes with you. ° °Activity ° °You may drive the day following your surgery, if you are comfortable and no longer taking prescription pain medication. Resume full activity as the soreness in your incision resolves. ° °Bathing/Showering ° °You may shower after you go home. Keep your incision dry for 48 hours. Do not soak in a bathtub, hot tub, or swim until the incision heals completely. You may not shower if you have a hemodialysis catheter. ° °Incision Care ° °Clean your incision with mild soap and water after 48 hours. Pat the area dry with a clean towel. You do not need a bandage unless otherwise instructed. Do not apply any ointments or creams to your incision. You may have skin glue on your incision. Do not peel it off. It will come off on its own in about one week. Your arm may swell a bit after surgery. To reduce swelling use pillows to elevate your arm so it is above your heart. Your doctor will tell you if you need to lightly wrap your arm with an ACE bandage. ° °Diet ° °Resume your normal diet. There are not special food restrictions following this procedure. In order to heal from your surgery, it is CRITICAL to get adequate nutrition. Your body requires vitamins, minerals, and protein. Vegetables are the best source of vitamins and minerals. Vegetables also provide the perfect balance of protein. Processed food has little nutritional value, so try to avoid this. ° °Medications ° °Resume taking all of your medications. If your incision is causing pain, you may take over-the counter pain relievers such as acetaminophen (Tylenol). If you were prescribed a stronger pain medication, please be aware these medications can cause nausea and constipation. Prevent  nausea by taking the medication with a snack or meal. Avoid constipation by drinking plenty of fluids and eating foods with high amount of fiber, such as fruits, vegetables, and grains. Do not take Tylenol if you are taking prescription pain medications. ° ° ° ° °Follow up °Your surgeon may want to see you in the office following your access surgery. If so, this will be arranged at the time of your surgery. ° °Please call us immediately for any of the following conditions: ° °Increased pain, redness, drainage (pus) from your incision site °Fever of 101 degrees or higher °Severe or worsening pain at your incision site °Hand pain or numbness. ° °Reduce your risk of vascular disease: ° °Stop smoking. If you would like help, call [facility] at 1-800-QUIT-NOW (1-800-784-8669) or [place] at 336-586-4000 ° °Manage your cholesterol °Maintain a desired weight °Control your diabetes °Keep your blood pressure down ° °Dialysis ° °It will take several weeks to several months for your new dialysis access to be ready for use. Your surgeon will determine when it is OK to use it. Your nephrologist will continue to direct your dialysis. You can continue to use your Permcath until your new access is ready for use. ° °If you have any questions, please call the office at [phone]. ° °

## 2018-04-13 NOTE — Op Note (Signed)
    NAME: Katrina Yang    MRN: [id number] DOB: 1941/03/02    DATE OF OPERATION: 04/13/2018  PREOP DIAGNOSIS:    Stage IV chronic kidney disease  POSTOP DIAGNOSIS:    Same  PROCEDURE:    Second stage right basilic vein transposition  SURGEON: Judeth Cornfield. Scot Dock, MD, FACS  ASSIST: Laurence Slate, PA  ANESTHESIA: General  EBL: Minimal  INDICATIONS:    CHEVIE BIRKHEAD is a 77 y.o. female who presents for a second stage basilic vein transposition.  She is not on dialysis.  FINDINGS:   The basilic vein emptied into the brachial vein in the mid upper arm.  There was an excellent thrill in the fistula at the completion of the procedure and a palpable right radial pulse.  TECHNIQUE:   The patient was taken to the operating room and received a general anesthetic.  The right arm was prepped and draped in usual sterile fashion.  Incision was made over the arterial anastomosis and here the brachial artery was dissected free proximally and distal to the anastomosis.  Next using 3 additional incisions along the medial aspect of the right upper arm the basilic vein was dissected free to the mid upper arm where it emptied into the brachial system.  Branches were all divided between clips and 3-0 silk ties.  The brachial vein was then harvested all the way up to the axilla.  Again all branches were divided between clips and 3-0 silk ties.  The vein was then ligated just beyond the anastomosis and then removed from the tunnel and irrigated up with heparinized saline.  It was marked to prevent twisting.  A tunneler was used to create a tunnel from the brachial artery to the axillary incision and the vein was brought through the tunnel.  The brachial artery was then clamped proximally and distally and the area of the anastomosis was opened longitudinally proximally and distally.  The vein was sewn into side to the artery using a continuous 6-0 Prolene suture.  At the completion was an excellent  thrill in the fistula and a palpable right radial pulse.  Hemostasis was obtained in the wounds.  Each of the incisions was closed with a deep layer of 3-0 Vicryl and the skin closed with 4-0 Vicryl.  Dermabond was applied.  A sterile dressing was applied.  The patient tolerated the procedure well was transferred to the recovery room in stable condition.  All needle and sponge counts were correct.  Deitra Mayo, MD, FACS [facility]  DATE OF DICTATION:   04/13/2018

## 2018-04-13 NOTE — Telephone Encounter (Signed)
-----   Message from Mena Goes, RN sent at 04/13/2018 12:11 PM EST ----- Regarding: 6 weeks in [facility] clinic   ----- Message ----- From: Ulyses Amor, PA-C Sent: 04/13/2018  11:53 AM EST To: [facility] Admin Pool, [facility] Charge Pool   S/P second stage basilic av fistula f/u in [facility] clinic in 6 weeks on a Wed.

## 2018-04-13 NOTE — Telephone Encounter (Signed)
sch appt lvm mld ltr 05/26/2018 1pm Dialysis duplex 2pm p/o PA

## 2018-04-13 NOTE — Anesthesia Postprocedure Evaluation (Signed)
Anesthesia Post Note  Patient: Katrina Yang  Procedure(s) Performed: BASILIC VEIN TRANSPOSITION SECOND STAGE (Right Arm Upper)     Patient location during evaluation: PACU Anesthesia Type: General Level of consciousness: sedated Pain management: pain level controlled Vital Signs Assessment: post-procedure vital signs reviewed and stable Respiratory status: spontaneous breathing and respiratory function stable Cardiovascular status: stable Postop Assessment: no apparent nausea or vomiting Anesthetic complications: no    Last Vitals:  Vitals:   04/13/18 1320 04/13/18 1330  BP:  (!) 121/54  Pulse:  71  Resp: 16 16  Temp:    SpO2: 95% 100%    Last Pain:  Vitals:   04/13/18 1330  TempSrc:   PainSc: 0-No pain                 Gohan Collister DANIEL

## 2018-04-13 NOTE — Anesthesia Procedure Notes (Signed)
Procedure Name: LMA Insertion Date/Time: 04/13/2018 9:22 AM Performed by: Barrington Ellison, CRNA Pre-anesthesia Checklist: Patient identified, Emergency Drugs available, Suction available and Patient being monitored Patient Re-evaluated:Patient Re-evaluated prior to induction Oxygen Delivery Method: Circle System Utilized Preoxygenation: Pre-oxygenation with 100% oxygen Induction Type: IV induction Ventilation: Mask ventilation without difficulty LMA: LMA inserted LMA Size: 4.0 Number of attempts: 1 Placement Confirmation: positive ETCO2 Tube secured with: Tape Dental Injury: Teeth and Oropharynx as per pre-operative assessment

## 2018-04-13 NOTE — H&P (Signed)
    Patient name: Katrina Yang MRN: [id number] DOB: 1940/10/13 Sex: female  REASON FOR VISIT: For second stage right basilic vein transposition  HPI: Katrina Yang is a 77 y.o. female who I last saw in the office on 03/10/2018.  This patient had a first stage basilic vein transposition on 12/29/2017.  Venous duplex scan showed good flow in the fistula and a diameter ranging from 0.74-0.98 cm.  When I saw her for follow-up I felt she was ready for second stage basilic vein transposition.  She is not on dialysis.  Current Facility-Administered Medications  Medication Dose Route Frequency Provider Last Rate Last Dose  . 0.9 %  sodium chloride infusion   Intravenous Continuous Duane Boston, MD 10 mL/hr at 04/13/18 0754    . ceFAZolin (ANCEF) IVPB 2g/100 mL premix  2 g Intravenous 30 min Pre-Op Angelia Mould, MD       REVIEW OF SYSTEMS: Valu.Nieves ] denotes positive finding; [  ] denotes negative finding  CARDIOVASCULAR:  [ ]  chest pain   [ ]  dyspnea on exertion  [ ]  leg swelling  CONSTITUTIONAL:  [ ]  fever   [ ]  chills   PHYSICAL EXAM: Vitals:   04/13/18 0718  BP: (!) 152/67  Pulse: 68  Resp: 18  Temp: (!) 97.5 F (36.4 C)  TempSrc: Oral  SpO2: 100%  Weight: 53.5 kg  Height: 5\' 3"  (1.6 m)   GENERAL: The patient is a well-nourished female, in no acute distress. The vital signs are documented above. CARDIOVASCULAR: There is a regular rate and rhythm. PULMONARY: There is good air exchange bilaterally without wheezing or rales. Excellent thrill in right for stage basilic vein transposition  MEDICAL ISSUES: STAGE IV CHRONIC KIDNEY DISEASE: The patient presents for a second stage basilic vein transposition.  The procedure and risks have been discussed with the patient and she is agreeable to proceed.  Deitra Mayo [facility] [place]: [phone]

## 2018-04-14 ENCOUNTER — Encounter (HOSPITAL_COMMUNITY): Payer: Self-pay | Admitting: Vascular Surgery

## 2018-04-20 ENCOUNTER — Telehealth: Payer: Self-pay

## 2018-04-20 NOTE — Telephone Encounter (Signed)
Returned call to patient who stated she was having some drainage from her incision that started 2 days ago. No redness, odor, fever or any signs of infection. Instructed to keep arm elevated as much as possible and if the drainage has an odor, or if she develops redness or fever to call back immediately. If occurs over weekend go to ER.

## 2018-05-04 LAB — CUP PACEART REMOTE DEVICE CHECK
Battery Remaining Longevity: 125 mo
Battery Remaining Percentage: 95.5 %
Battery Voltage: 3.02 V
Brady Statistic AP VS Percent: 11 %
Brady Statistic AS VP Percent: 1 %
Brady Statistic AS VS Percent: 89 %
Brady Statistic RA Percent Paced: 10 %
Brady Statistic RV Percent Paced: 1 %
Date Time Interrogation Session: 20191028063049
Implantable Lead Implant Date: 20181022
Implantable Lead Implant Date: 20181022
Implantable Lead Location: 753859
Implantable Lead Location: 753860
Implantable Pulse Generator Implant Date: 20181022
Lead Channel Impedance Value: 410 Ohm
Lead Channel Pacing Threshold Amplitude: 0.375 V
Lead Channel Pacing Threshold Amplitude: 0.75 V
Lead Channel Pacing Threshold Pulse Width: 0.5 ms
Lead Channel Pacing Threshold Pulse Width: 0.5 ms
Lead Channel Sensing Intrinsic Amplitude: 10.1 mV
Lead Channel Sensing Intrinsic Amplitude: 4.4 mV
Lead Channel Setting Pacing Amplitude: 0.625
Lead Channel Setting Pacing Amplitude: 2 V
Lead Channel Setting Pacing Pulse Width: 0.5 ms
Lead Channel Setting Sensing Sensitivity: 2 mV
MDC IDC MSMT LEADCHNL RV IMPEDANCE VALUE: 640 Ohm
MDC IDC STAT BRADY AP VP PERCENT: 1 %
Pulse Gen Model: 2272
Pulse Gen Serial Number: 8958692

## 2018-05-10 ENCOUNTER — Other Ambulatory Visit: Payer: Self-pay | Admitting: Endocrinology

## 2018-05-25 ENCOUNTER — Other Ambulatory Visit: Payer: Self-pay

## 2018-05-25 DIAGNOSIS — N184 Chronic kidney disease, stage 4 (severe): Secondary | ICD-10-CM

## 2018-05-25 NOTE — Progress Notes (Signed)
  POST OPERATIVE OFFICE NOTE    CC:  F/u for surgery  HPI:  This is a 78 y.o. female who is s/p 2nd stage right BVT on 04/13/18 by Dr. Scot Dock.  His original 1st stage was on 12/29/17 also by Dr. Scot Dock.  He presents today for follow up with duplex.   She is doing well and does not have any evidence of steal as she denies pain or numbness in her right hand.  She is not yet on HD.   Allergies  Allergen Reactions  . Iodinated Diagnostic Agents Anaphylaxis, Swelling and Other (See Comments)    Throat swelling  . Latex Rash  . Codeine Nausea And Vomiting and Rash  . Lovastatin Nausea Only  . Metronidazole Nausea And Vomiting  . Sulfa Antibiotics Nausea And Vomiting and Rash    Current Outpatient Medications  Medication Sig Dispense Refill  . amLODipine (NORVASC) 5 MG tablet Take 5 mg by mouth 2 (two) times daily.    Marland Kitchen aspirin EC 81 MG tablet Take 81 mg by mouth daily.    . calcitRIOL (ROCALTROL) 0.25 MCG capsule TAKE 1 CAPSULE (0.25 MCG TOTAL) BY MOUTH DAILY. 90 capsule 2  . clopidogrel (PLAVIX) 75 MG tablet TAKE 1 TABLET BY MOUTH EVERY DAY (Patient taking differently: Take 75 mg by mouth daily. ) 90 tablet 3  . gabapentin (NEURONTIN) 100 MG capsule Take 1 capsule (100 mg total) by mouth 2 (two) times daily. 180 capsule 3  . levothyroxine (SYNTHROID, LEVOTHROID) 88 MCG tablet TAKE 1 TABLET (88 MCG TOTAL) BY MOUTH DAILY BEFORE BREAKFAST. 90 tablet 3  . metoprolol tartrate (LOPRESSOR) 25 MG tablet TAKE 1 TABLET (25 MG TOTAL) BY MOUTH 2 (TWO) TIMES DAILY. 180 tablet 3  . rosuvastatin (CRESTOR) 10 MG tablet TAKE 1/2 TABLET EVERY DAY (Patient taking differently: Take 5 mg by mouth daily. ) 90 tablet 1  . traMADol (ULTRAM) 50 MG tablet Take 1 tablet (50 mg total) by mouth every 6 (six) hours as needed. 10 tablet 0   No current facility-administered medications for this visit.      ROS:  See HPI  Physical Exam:  Today's Vitals   05/26/18 1345  BP: (!) 154/68  Pulse: 76  Resp: 20    Temp: 97.6 F (36.4 C)  SpO2: 100%  Weight: 118 lb (53.5 kg)  Height: 5\' 3"  (1.6 m)   Body mass index is 20.9 kg/m.   Incision:  Healed nicely Extremities:  Excellent thrill in fistula; right radial pulse is palpable   Dialysis duplex 05/26/2018: Depth:  0.24cm-0.65cm Diameter:  0.35cm-0.74cm -Patent right arteriovenous fistula with slight diameter changes in the outflow vein in the distal upper arm and antecubital fossa area.   Assessment/Plan:  This is a 78 y.o. female who is s/p: 2nd stage right BVT on 04/13/18 by Dr. Scot Dock.  His original 1st stage was on 12/2717 also by Dr. Scot Dock.  -pt fistula has excellent thrill and is easily palpable.  The fistula is ready to use should she need it.  -Her right radial pulse is palpable.  she does not have any evidence of steal.     Leontine Locket, PA-C [facility] [phone]  Clinic MD:  Scot Dock

## 2018-05-26 ENCOUNTER — Other Ambulatory Visit: Payer: Self-pay

## 2018-05-26 ENCOUNTER — Ambulatory Visit (INDEPENDENT_AMBULATORY_CARE_PROVIDER_SITE_OTHER): Payer: Medicare Other | Admitting: Physician Assistant

## 2018-05-26 ENCOUNTER — Ambulatory Visit (HOSPITAL_COMMUNITY)
Admission: RE | Admit: 2018-05-26 | Discharge: 2018-05-26 | Disposition: A | Discharge status: Home / Self Care | Payer: Medicare Other | Source: Ambulatory Visit | Attending: Family | Admitting: Family

## 2018-05-26 VITALS — BP 154/68 | HR 76 | Temp 97.6°F | Resp 20 | Ht 63.0 in | Wt 118.0 lb

## 2018-05-26 DIAGNOSIS — N184 Chronic kidney disease, stage 4 (severe): Secondary | ICD-10-CM | POA: Diagnosis present

## 2018-05-31 ENCOUNTER — Ambulatory Visit (INDEPENDENT_AMBULATORY_CARE_PROVIDER_SITE_OTHER): Payer: Medicare Other

## 2018-05-31 DIAGNOSIS — R55 Syncope and collapse: Secondary | ICD-10-CM | POA: Diagnosis not present

## 2018-06-01 NOTE — Progress Notes (Signed)
Remote pacemaker transmission.   

## 2018-06-03 LAB — CUP PACEART REMOTE DEVICE CHECK
Battery Remaining Longevity: 123 mo
Battery Remaining Percentage: 95 %
Battery Voltage: 3.02 V
Brady Statistic AP VS Percent: 6.7 %
Brady Statistic AS VP Percent: 1 %
Brady Statistic AS VS Percent: 93 %
Brady Statistic RA Percent Paced: 6.1 %
Brady Statistic RV Percent Paced: 1 %
Date Time Interrogation Session: 20200127091132
Implantable Lead Implant Date: 20181022
Implantable Lead Implant Date: 20181022
Implantable Lead Location: 753860
Implantable Pulse Generator Implant Date: 20181022
Lead Channel Impedance Value: 400 Ohm
Lead Channel Impedance Value: 640 Ohm
Lead Channel Pacing Threshold Amplitude: 0.375 V
Lead Channel Pacing Threshold Amplitude: 0.5 V
Lead Channel Pacing Threshold Pulse Width: 0.5 ms
Lead Channel Pacing Threshold Pulse Width: 0.5 ms
Lead Channel Sensing Intrinsic Amplitude: 3.4 mV
Lead Channel Setting Pacing Amplitude: 0.625
Lead Channel Setting Pacing Amplitude: 2 V
MDC IDC LEAD LOCATION: 753859
MDC IDC MSMT LEADCHNL RV SENSING INTR AMPL: 11.8 mV
MDC IDC SET LEADCHNL RV PACING PULSEWIDTH: 0.5 ms
MDC IDC SET LEADCHNL RV SENSING SENSITIVITY: 2 mV
MDC IDC STAT BRADY AP VP PERCENT: 1 %
Pulse Gen Model: 2272
Pulse Gen Serial Number: 8958692

## 2018-06-10 ENCOUNTER — Encounter: Payer: Self-pay | Admitting: Gastroenterology

## 2018-06-11 ENCOUNTER — Other Ambulatory Visit (HOSPITAL_COMMUNITY): Payer: Self-pay

## 2018-06-14 ENCOUNTER — Encounter (HOSPITAL_COMMUNITY)
Admission: RE | Admit: 2018-06-14 | Discharge: 2018-06-14 | Disposition: A | Discharge status: Home / Self Care | Payer: Medicare Other | Source: Ambulatory Visit | Attending: Nephrology | Admitting: Nephrology

## 2018-06-14 DIAGNOSIS — D631 Anemia in chronic kidney disease: Secondary | ICD-10-CM | POA: Insufficient documentation

## 2018-06-14 DIAGNOSIS — N184 Chronic kidney disease, stage 4 (severe): Secondary | ICD-10-CM | POA: Insufficient documentation

## 2018-06-14 MED ORDER — SODIUM CHLORIDE 0.9 % IV SOLN
510.0000 mg | INTRAVENOUS | Status: DC
  Administered 2018-06-14: 510 mg via INTRAVENOUS
  Filled 2018-06-14: qty 510

## 2018-06-18 ENCOUNTER — Other Ambulatory Visit (HOSPITAL_COMMUNITY): Payer: Self-pay | Admitting: *Deleted

## 2018-06-21 ENCOUNTER — Telehealth: Payer: Self-pay | Admitting: Cardiovascular Disease

## 2018-06-21 ENCOUNTER — Ambulatory Visit (HOSPITAL_COMMUNITY)
Admission: RE | Admit: 2018-06-21 | Discharge: 2018-06-21 | Disposition: A | Discharge status: Home / Self Care | Payer: Medicare Other | Source: Ambulatory Visit | Attending: Nephrology | Admitting: Nephrology

## 2018-06-21 ENCOUNTER — Other Ambulatory Visit: Payer: Self-pay | Admitting: Neurology

## 2018-06-21 DIAGNOSIS — D631 Anemia in chronic kidney disease: Secondary | ICD-10-CM | POA: Insufficient documentation

## 2018-06-21 DIAGNOSIS — N189 Chronic kidney disease, unspecified: Secondary | ICD-10-CM | POA: Insufficient documentation

## 2018-06-21 MED ORDER — SODIUM CHLORIDE 0.9 % IV SOLN
510.0000 mg | INTRAVENOUS | Status: DC
  Administered 2018-06-21: 510 mg via INTRAVENOUS
  Filled 2018-06-21: qty 17

## 2018-06-21 NOTE — Telephone Encounter (Signed)
Katrina Yang reports leg pain for about 1 month. She states she gets "really bad cramping" in both legs that keeps her up at night. She reports having muscle spasms and her right leg especially is "acting up" and will "lock up" on her. The spasms occur at both rest and while walking. She is concerned and wants to see Dr. Fletcher Anon because her husband has poor circulation and she "doesn't want to lose a leg like him."   She had labs drawn in December and her K=4.6. No magnesium levels are available.   She understands Dr. Burt Knack will be asked for recommendations. She will call her PCP in the meantime in case this is not a vascular problem.

## 2018-06-21 NOTE — Telephone Encounter (Signed)
° °  Patient calling to report pain in legs and ankles, for 1 month No other symptoms  Requesting referral to see Dr Fletcher Anon

## 2018-06-21 NOTE — Telephone Encounter (Signed)
I would recommend starting with bilateral ABI's. If normal, her symptoms would not be related to a vascular problem and she could see her PCP for further recommendations.

## 2018-06-22 NOTE — Telephone Encounter (Signed)
Offered Katrina Yang ABIs and she declined at time. She states she thinks her issues are coming from a new injection Dr. Gladstone Lighter used a couple weeks ago. She will call and speak with him.  She understands to call if she changes her mind or if symptoms persist and ABIs will be ordered. She was grateful for assistance.

## 2018-07-01 ENCOUNTER — Telehealth: Payer: Self-pay | Admitting: Cardiovascular Disease

## 2018-07-01 DIAGNOSIS — M79605 Pain in left leg: Secondary | ICD-10-CM

## 2018-07-01 DIAGNOSIS — M79604 Pain in right leg: Secondary | ICD-10-CM

## 2018-07-01 NOTE — Telephone Encounter (Signed)
New Message  Patient needs to speak to Katrina Yang about ordering ABI test as per their previous conversation.

## 2018-07-01 NOTE — Telephone Encounter (Signed)
The patient states she now wants ABIs ordered. Per 2/17 phone encounter, "Sherren Mocha, MD  to Me    5:39 PM  Note    I would recommend starting with bilateral ABI's. If normal, her symptoms would not be related to a vascular problem and she could see her PCP for further recommendations.       ABIs ordered for scheduling.

## 2018-07-05 ENCOUNTER — Ambulatory Visit: Payer: Medicare Other | Admitting: Cardiovascular Disease

## 2018-07-05 ENCOUNTER — Encounter: Payer: Self-pay | Admitting: Cardiovascular Disease

## 2018-07-05 VITALS — BP 132/74 | HR 76 | Ht 63.0 in | Wt 116.4 lb

## 2018-07-05 DIAGNOSIS — I69398 Other sequelae of cerebral infarction: Secondary | ICD-10-CM

## 2018-07-05 DIAGNOSIS — E782 Mixed hyperlipidemia: Secondary | ICD-10-CM | POA: Diagnosis not present

## 2018-07-05 DIAGNOSIS — R011 Cardiac murmur, unspecified: Secondary | ICD-10-CM

## 2018-07-05 DIAGNOSIS — R269 Unspecified abnormalities of gait and mobility: Secondary | ICD-10-CM

## 2018-07-05 DIAGNOSIS — I10 Essential (primary) hypertension: Secondary | ICD-10-CM

## 2018-07-05 DIAGNOSIS — M79605 Pain in left leg: Secondary | ICD-10-CM

## 2018-07-05 DIAGNOSIS — M79604 Pain in right leg: Secondary | ICD-10-CM | POA: Diagnosis not present

## 2018-07-05 MED ORDER — AMLODIPINE BESYLATE 5 MG PO TABS: 5.0000 mg | ORAL_TABLET | Freq: Two times a day (BID) | ORAL | 3 refills | Status: DC

## 2018-07-05 NOTE — Progress Notes (Signed)
Cardiology Office Note:    Date:  07/05/2018   ID:  Katrina Yang, DOB 07-Dec-1940, MRN [id number]  PCP:  Libby Maw, MD  Cardiologist:  No primary care provider on file.  Electrophysiologist:  None   Referring MD: Libby Maw,*   Chief Complaint  Patient presents with  . Leg Pain    History of Present Illness:    Katrina Yang is a 78 y.o. female with a hx of bifascicular block and syncope, now status post permanent pacemaker placement.  She has a history of stroke, hypertension, hyperlipidemia, and stage 4 CKD with a functioning AV fistular in the right arm. .  She has a hx of muscle spasms in her calves over the past few years. She's now having pain at her ankles, mostly at night time. States that the pain is so severe that she's unable to stand. 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  She has been concerned about peripheral arterial disease since she has multiple risk factors.  Her husband just underwent amputation for the same problem.  She has not had any recent chest pain, chest pressure, or shortness of breath.  She denies orthopnea, PND, or heart palpitations.  Past Medical History:  Diagnosis Date  . Anemia   . Bifascicular block   . Carotid bruit    a.  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  . Cervical spondylosis without myelopathy 02/01/2015  . CKD (chronic kidney disease), stage IV ([place])   . Colonic polyp    hx of  . CVA (cerebral vascular accident) ([place])    hx of  . Degenerative joint disease   . Diverticulosis of colon (without mention of hemorrhage)   . History of hysterectomy   . Hypertension    a. Renal artery dopplers in 2012 with no evidence for significant renal artery stenosis.   . Occipital neuralgia of left side 08/30/2013  . Osteoporosis, unspecified   . Palpitations    a. Event monitor (8/14) with no significant abnormalities.   . Peripheral neuropathic pain   . Personal history of unspecified urinary disorder    . Presence of permanent cardiac pacemaker   . Pulmonary nodule   . Pure hypercholesterolemia   . Secondary hyperparathyroidism (of renal origin)   . Unspecified hypothyroidism     Past Surgical History:  Procedure Laterality Date  . ABDOMINAL HYSTERECTOMY    . BACK SURGERY     diskectomy L-5. Multiple back surgeries  . BASCILIC VEIN TRANSPOSITION Right 12/29/2017   Procedure: FIRST STAGE BASILIC VEIN TRANSPOSITION RIGHT ARM;  Surgeon: Angelia Mould, MD;  Location: [facility];  Service: Vascular;  Laterality: Right;  . BASCILIC VEIN TRANSPOSITION Right 04/13/2018   Procedure: BASILIC VEIN TRANSPOSITION SECOND STAGE;  Surgeon: Angelia Mould, MD;  Location: [facility];  Service: Vascular;  Laterality: Right;  . MENISCECTOMY Left 11/14/15  . PACEMAKER IMPLANT N/A 02/23/2017   Procedure: PACEMAKER IMPLANT;  Surgeon: Evans Lance, MD;  Location: [facility];  Service: Cardiovascular;  Laterality: N/A;    Current Medications: Current Meds  Medication Sig  . amLODipine (NORVASC) 5 MG tablet Take 1 tablet (5 mg total) by mouth 2 (two) times daily.  . calcitRIOL (ROCALTROL) 0.25 MCG capsule TAKE 1 CAPSULE (0.25 MCG TOTAL) BY MOUTH DAILY.  Marland Kitchen clopidogrel (PLAVIX) 75 MG tablet TAKE 1 TABLET BY MOUTH EVERY DAY  . gabapentin (NEURONTIN) 100 MG capsule TAKE 1 CAPSULE BY MOUTH TWICE A DAY  .  levothyroxine (SYNTHROID, LEVOTHROID) 88 MCG tablet TAKE 1 TABLET (88 MCG TOTAL) BY MOUTH DAILY BEFORE BREAKFAST.  . metoprolol tartrate (LOPRESSOR) 25 MG tablet TAKE 1 TABLET (25 MG TOTAL) BY MOUTH 2 (TWO) TIMES DAILY.  . rosuvastatin (CRESTOR) 10 MG tablet TAKE 1/2 TABLET EVERY DAY  . [DISCONTINUED] amLODipine (NORVASC) 5 MG tablet Take 5 mg by mouth 2 (two) times daily.  . [DISCONTINUED] aspirin EC 81 MG tablet Take 81 mg by mouth daily.     Allergies:   Iodinated diagnostic agents; Latex; Codeine; Lovastatin; Metronidazole; and Sulfa antibiotics   Social History   Socioeconomic History   . Marital status: Married    Spouse name: [name]  . Number of children: 1  . Years of education: 2  . Highest education level: Not on file  Occupational History  . Occupation: Retired    Fish farm manager: [employer]    Comment: Metallurgist  Social Needs  . Financial resource strain: Not on file  . Food insecurity:    Worry: Not on file    Inability: Not on file  . Transportation needs:    Medical: Not on file    Non-medical: Not on file  Tobacco Use  . Smoking status: Never Smoker  . Smokeless tobacco: Never Used  Substance and Sexual Activity  . Alcohol use: No  . Drug use: No  . Sexual activity: Not on file  Lifestyle  . Physical activity:    Days per week: Not on file    Minutes per session: Not on file  . Stress: Not on file  Relationships  . Social connections:    Talks on phone: Not on file    Gets together: Not on file    Attends religious service: Not on file    Active member of club or organization: Not on file    Attends meetings of clubs or organizations: Not on file    Relationship status: Not on file  Other Topics Concern  . Not on file  Social History Narrative   Pt lives at home with her spouse.   Patient drinks 1 glass of tea every other day.   Patient is right handed.     Family History: The patient's family history includes Colon cancer in her mother; Heart attack in her father; Heart disease in her father and mother; Heart failure in her mother; Kidney cancer in her father; Ovarian cancer in her mother. There is no history of Stomach cancer.  ROS:   Please see the history of present illness.    All other systems reviewed and are negative.  EKGs/Labs/Other Studies Reviewed:    The following studies were reviewed today: 2D Echo 02-23-2017: Study Conclusions  - Left ventricle: The cavity size was normal. Wall thickness was   normal. Systolic function was normal. The estimated ejection   fraction was in the range of 60% to 65%. Wall  motion was normal;   there were no regional wall motion abnormalities. Doppler   parameters are consistent with abnormal left ventricular   relaxation (grade 1 diastolic dysfunction). - Aortic valve: There was no stenosis. - Mitral valve: There was no significant regurgitation. - Right ventricle: The cavity size was normal. Systolic function   was normal. - Pulmonary arteries: No complete TR doppler jet so unable to   estimate PA systolic pressure. - Inferior vena cava: The vessel was normal in size. The   respirophasic diameter changes were in the normal range (>= 50%),   consistent with  normal central venous pressure.  Impressions:  - Normal LV size and systolic function, EF 66-44%. Normal RV size   and systolic function. No significant valvular abnormalities.  EKG:  EKG is not ordered today.    Recent Labs: 09/23/2017: Platelets 283.0 03/04/2018: TSH 0.49 04/13/2018: Hemoglobin 10.5; Potassium 4.6; Sodium 140  Recent Lipid Panel    Component Value Date/Time   CHOL 194 03/04/2018 1043   TRIG 59.0 03/04/2018 1043   HDL 98.70 03/04/2018 1043   CHOLHDL 2 03/04/2018 1043   VLDL 11.8 03/04/2018 1043   LDLCALC 83 03/04/2018 1043   LDLDIRECT 107.3 02/14/2013 0843    Physical Exam:    VS:  BP 132/74   Pulse 76   Ht 5\' 3"  (1.6 m)   Wt 116 lb 6.4 oz (52.8 kg)   SpO2 96%   BMI 20.62 kg/m     Wt Readings from Last 3 Encounters:  07/05/18 116 lb 6.4 oz (52.8 kg)  06/14/18 118 lb (53.5 kg)  05/26/18 118 lb (53.5 kg)     GEN:  Well nourished, well developed in no acute distress HEENT: Normal NECK: No JVD; R>L carotid bruits LYMPHATICS: No lymphadenopathy CARDIAC: RRR, 2/6 harsh early-mid peaking systolic murmur a the RUSB. PT pulses 2+ bilaterally. RESPIRATORY:  Clear to auscultation without rales, wheezing or rhonchi  ABDOMEN: Soft, non-tender, non-distended MUSCULOSKELETAL:  Trace bilateral pretibial edema; No deformity  SKIN: Warm and dry NEUROLOGIC:  Alert and  oriented x 3 PSYCHIATRIC:  Normal affect   ASSESSMENT:    1. Murmur   2. Pain in both lower extremities   3. Altered mobility due to old stroke   4. Mixed hyperlipidemia   5. Essential hypertension    PLAN:    In order of problems listed above:  1. Most recent echo reviewed.  Will repeat next year prior to her office visit. 2. ABIs are scheduled later this month.  The patient has palpable PT pulses bilaterally.  I do not think her leg pain have a high likelihood of being related to the peripheral arterial disease.  Because of easy bruising, I am going to discontinue her aspirin and have her remain on clopidogrel. 3. With her history of stroke or TIA, she will continue on medical therapy with a statin drug and antiplatelet therapy with clopidogrel.  Aspirin will be discontinued. 4. The patient is treated with a statin drug at high as tolerated.   5. Hypertension is well controlled with amlodipine and metoprolol.  Unfortunately she has developed stage IV chronic kidney disease and now has an active AV fistula in the right arm.    Medication Adjustments/Labs and Tests Ordered: Current medicines are reviewed at length with the patient today.  Concerns regarding medicines are outlined above.  Orders Placed This Encounter  Procedures  . ECHOCARDIOGRAM COMPLETE   Meds ordered this encounter  Medications  . amLODipine (NORVASC) 5 MG tablet    Sig: Take 1 tablet (5 mg total) by mouth 2 (two) times daily.    Dispense:  180 tablet    Refill:  3    Patient Instructions  Medication Instructions:  1) STOP ASPIRIN  Labwork: None  Testing/Procedures: No new orders   Follow-Up: Your provider wants you to follow-up in: 1 year with Dr. Burt Knack. You will receive a reminder letter in the mail two months in advance. If you don't receive a letter, please call our office to schedule the follow-up appointment.      Signed, Sherren Mocha, MD  07/05/2018 2:55 PM    [facility]

## 2018-07-05 NOTE — Patient Instructions (Signed)
Medication Instructions:  1) STOP ASPIRIN  Labwork: None  Testing/Procedures: No new orders   Follow-Up: Your provider wants you to follow-up in: 1 year with Dr. Burt Knack. You will receive a reminder letter in the mail two months in advance. If you don't receive a letter, please call our office to schedule the follow-up appointment.

## 2018-07-16 ENCOUNTER — Other Ambulatory Visit: Payer: Self-pay | Admitting: Cardiovascular Disease

## 2018-07-16 DIAGNOSIS — I739 Peripheral vascular disease, unspecified: Secondary | ICD-10-CM

## 2018-07-23 ENCOUNTER — Other Ambulatory Visit: Payer: Self-pay | Admitting: Family Medicine

## 2018-07-27 ENCOUNTER — Encounter (HOSPITAL_COMMUNITY): Payer: Medicare Other

## 2018-08-03 ENCOUNTER — Other Ambulatory Visit: Payer: Self-pay | Admitting: Endocrinology

## 2018-08-26 ENCOUNTER — Other Ambulatory Visit: Payer: Self-pay | Admitting: Neurology

## 2018-08-26 ENCOUNTER — Other Ambulatory Visit: Payer: Self-pay | Admitting: Cardiovascular Disease

## 2018-08-27 ENCOUNTER — Ambulatory Visit: Payer: Medicare Other | Admitting: Family Medicine

## 2018-08-30 ENCOUNTER — Ambulatory Visit (INDEPENDENT_AMBULATORY_CARE_PROVIDER_SITE_OTHER): Payer: Medicare Other | Admitting: *Deleted

## 2018-08-30 ENCOUNTER — Other Ambulatory Visit: Payer: Self-pay

## 2018-08-30 DIAGNOSIS — R55 Syncope and collapse: Secondary | ICD-10-CM | POA: Diagnosis not present

## 2018-08-30 DIAGNOSIS — R002 Palpitations: Secondary | ICD-10-CM

## 2018-08-31 ENCOUNTER — Encounter (HOSPITAL_COMMUNITY): Payer: Medicare Other

## 2018-08-31 LAB — CUP PACEART REMOTE DEVICE CHECK
Battery Remaining Longevity: 131 mo
Battery Remaining Percentage: 95.5 %
Battery Voltage: 3.02 V
Brady Statistic AP VP Percent: 1 %
Brady Statistic AP VS Percent: 5.9 %
Brady Statistic AS VP Percent: 1 %
Brady Statistic AS VS Percent: 94 %
Brady Statistic RA Percent Paced: 5.4 %
Brady Statistic RV Percent Paced: 1 %
Date Time Interrogation Session: 20200427075633
Implantable Lead Implant Date: 20181022
Implantable Lead Implant Date: 20181022
Implantable Lead Location: 753859
Implantable Lead Location: 753860
Implantable Pulse Generator Implant Date: 20181022
Lead Channel Impedance Value: 390 Ohm
Lead Channel Impedance Value: 610 Ohm
Lead Channel Pacing Threshold Amplitude: 0.5 V
Lead Channel Pacing Threshold Amplitude: 0.5 V
Lead Channel Pacing Threshold Pulse Width: 0.5 ms
Lead Channel Pacing Threshold Pulse Width: 0.5 ms
Lead Channel Sensing Intrinsic Amplitude: 11.8 mV
Lead Channel Sensing Intrinsic Amplitude: 2.9 mV
Lead Channel Setting Pacing Amplitude: 0.75 V
Lead Channel Setting Pacing Amplitude: 2 V
Lead Channel Setting Pacing Pulse Width: 0.5 ms
Lead Channel Setting Sensing Sensitivity: 2 mV
Pulse Gen Model: 2272
Pulse Gen Serial Number: 8958692

## 2018-09-07 NOTE — Progress Notes (Signed)
Remote pacemaker transmission.   

## 2018-09-15 ENCOUNTER — Other Ambulatory Visit: Payer: Self-pay | Admitting: Family Medicine

## 2018-10-01 ENCOUNTER — Other Ambulatory Visit: Payer: Self-pay | Admitting: Family Medicine

## 2018-10-05 ENCOUNTER — Ambulatory Visit (HOSPITAL_COMMUNITY)
Admission: RE | Admit: 2018-10-05 | Discharge: 2018-10-05 | Disposition: A | Discharge status: Home / Self Care | Payer: Medicare Other | Source: Ambulatory Visit | Attending: Cardiology | Admitting: Cardiology

## 2018-10-05 ENCOUNTER — Other Ambulatory Visit: Payer: Self-pay

## 2018-10-05 DIAGNOSIS — I739 Peripheral vascular disease, unspecified: Secondary | ICD-10-CM | POA: Diagnosis present

## 2018-10-06 ENCOUNTER — Other Ambulatory Visit: Payer: Self-pay | Admitting: Endocrinology

## 2018-10-06 NOTE — Telephone Encounter (Signed)
Please forward refill request to pt's new primary care provider.  

## 2018-10-06 NOTE — Telephone Encounter (Signed)
This medication is no longer managed by Dr. Loanne Drilling. Please review and refill if you deem appropriate. Thank you

## 2018-10-06 NOTE — Telephone Encounter (Signed)
Needs Office Visit

## 2018-10-19 ENCOUNTER — Telehealth: Payer: Medicare Other | Admitting: Neurology

## 2018-10-31 ENCOUNTER — Other Ambulatory Visit: Payer: Self-pay | Admitting: Family Medicine

## 2018-11-07 ENCOUNTER — Other Ambulatory Visit: Payer: Self-pay | Admitting: Family Medicine

## 2018-11-18 ENCOUNTER — Ambulatory Visit: Payer: Medicare Other | Admitting: Family Medicine

## 2018-11-24 ENCOUNTER — Other Ambulatory Visit: Payer: Self-pay | Admitting: Gastroenterology

## 2018-11-24 ENCOUNTER — Telehealth: Payer: Self-pay

## 2018-11-24 ENCOUNTER — Other Ambulatory Visit: Payer: Self-pay | Admitting: Family Medicine

## 2018-11-24 NOTE — Telephone Encounter (Signed)

## 2018-11-25 ENCOUNTER — Encounter: Payer: Self-pay | Admitting: Family Medicine

## 2018-11-25 ENCOUNTER — Other Ambulatory Visit: Payer: Self-pay

## 2018-11-25 ENCOUNTER — Ambulatory Visit: Payer: Medicare Other | Admitting: Family Medicine

## 2018-11-25 VITALS — BP 134/70 | HR 88 | Ht 63.0 in

## 2018-11-25 DIAGNOSIS — N185 Chronic kidney disease, stage 5: Secondary | ICD-10-CM | POA: Diagnosis not present

## 2018-11-25 DIAGNOSIS — E039 Hypothyroidism, unspecified: Secondary | ICD-10-CM

## 2018-11-25 NOTE — Progress Notes (Signed)
Established Patient Office Visit  Subjective:  Patient ID: Katrina Yang, female    DOB: [birth year]  Age: 78 y.o. MRN: [id number]  CC:  Chief Complaint  Patient presents with  . Follow-up    HPI [name] presents for follow-up of her hypothyroidism.  It is been stable for some time on 88 mcg of levothyroxine.  She does take any on a fasting stomach before eating in the morning.  She is feeling a little stressed and fatigued.  Helping care for her husband who has pending surgery for cervical disc disease.  Recent GFR of 8 continues to produce urine.  No dialysis as of yet.  Past Medical History:  Diagnosis Date  . Anemia   . Bifascicular block   . Carotid bruit    a.  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  . Cervical spondylosis without myelopathy 02/01/2015  . CKD (chronic kidney disease), stage IV ([place])   . Colonic polyp    hx of  . CVA (cerebral vascular accident) ([place])    hx of  . Degenerative joint disease   . Diverticulosis of colon (without mention of hemorrhage)   . History of hysterectomy   . Hypertension    a. Renal artery dopplers in 2012 with no evidence for significant renal artery stenosis.   . Occipital neuralgia of left side 08/30/2013  . Osteoporosis, unspecified   . Palpitations    a. Event monitor (8/14) with no significant abnormalities.   . Peripheral neuropathic pain   . Personal history of unspecified urinary disorder   . Presence of permanent cardiac pacemaker   . Pulmonary nodule   . Pure hypercholesterolemia   . Secondary hyperparathyroidism (of renal origin)   . Unspecified hypothyroidism     Past Surgical History:  Procedure Laterality Date  . ABDOMINAL HYSTERECTOMY    . BACK SURGERY     diskectomy L-5. Multiple back surgeries  . BASCILIC VEIN TRANSPOSITION Right 12/29/2017   Procedure: FIRST STAGE BASILIC VEIN TRANSPOSITION RIGHT ARM;  Surgeon: Angelia Mould, MD;  Location: [facility];  Service: Vascular;  Laterality: Right;   . BASCILIC VEIN TRANSPOSITION Right 04/13/2018   Procedure: BASILIC VEIN TRANSPOSITION SECOND STAGE;  Surgeon: Angelia Mould, MD;  Location: [facility];  Service: Vascular;  Laterality: Right;  . MENISCECTOMY Left 11/14/15  . PACEMAKER IMPLANT N/A 02/23/2017   Procedure: PACEMAKER IMPLANT;  Surgeon: Evans Lance, MD;  Location: [facility];  Service: Cardiovascular;  Laterality: N/A;    Family History  Problem Relation Age of Onset  . Kidney cancer Father   . Heart attack Father   . Heart disease Father   . Colon cancer Mother   . Ovarian cancer Mother   . Heart disease Mother   . Heart failure Mother   . Stomach cancer Neg Hx     Social History   Socioeconomic History  . Marital status: Married    Spouse name: [name]  . Number of children: 1  . Years of education: 2  . Highest education level: Not on file  Occupational History  . 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 Financial resource strain: Not on file  . Food insecurity    Worry: Not on file    Inability: Not on file  . Transportation needs    Medical: Not on file    Non-medical: Not on file  Tobacco Use  .  Smoking status: Never Smoker  . Smokeless tobacco: Never Used  Substance and Sexual Activity  . Alcohol use: No  . Drug use: No  . Sexual activity: Not on file  Lifestyle  . Physical activity    Days per week: Not on file    Minutes per session: Not on file  . Stress: Not on file  Relationships  . Social Herbalist on phone: Not on file    Gets together: Not on file    Attends religious service: Not on file    Active member of club or organization: Not on file    Attends meetings of clubs or organizations: Not on file    Relationship status: Not on file  . Intimate partner violence    Fear of current or ex partner: Not on file    Emotionally abused: Not on file    Physically abused: Not on file    Forced sexual  activity: Not on file  Other Topics Concern  . Not on file  Social History Narrative   Pt lives at home with her spouse.   Patient drinks 1 glass of tea every other day.   Patient is right handed.    Outpatient Medications Prior to Visit  Medication Sig Dispense Refill  . amLODipine (NORVASC) 5 MG tablet TAKE 1 TABLET BY MOUTH TWICE A DAY 180 tablet 3  . calcitRIOL (ROCALTROL) 0.25 MCG capsule TAKE 1 CAPSULE (0.25 MCG TOTAL) BY MOUTH DAILY. 90 capsule 2  . clopidogrel (PLAVIX) 75 MG tablet TAKE 1 TABLET BY MOUTH EVERY DAY 90 tablet 3  . gabapentin (NEURONTIN) 100 MG capsule TAKE 1 CAPSULE BY MOUTH TWICE A DAY 180 capsule 3  . levothyroxine (SYNTHROID, LEVOTHROID) 88 MCG tablet TAKE 1 TABLET (88 MCG TOTAL) BY MOUTH DAILY BEFORE BREAKFAST. 90 tablet 3  . metoprolol tartrate (LOPRESSOR) 25 MG tablet TAKE 1 TABLET BY MOUTH TWICE A DAY 60 tablet 0  . rosuvastatin (CRESTOR) 10 MG tablet TAKE 1/2 TABLET EVERY DAY 45 tablet 1   No facility-administered medications prior to visit.     Allergies  Allergen Reactions  . Iodinated Diagnostic Agents Anaphylaxis, Swelling and Other (See Comments)    Throat swelling  . Latex Rash  . Codeine Nausea And Vomiting and Rash  . Lovastatin Nausea Only  . Metronidazole Nausea And Vomiting  . Sulfa Antibiotics Nausea And Vomiting and Rash    ROS Review of Systems  Constitutional: Negative for diaphoresis, fatigue, fever and unexpected weight change.  HENT: Negative.   Eyes: Negative for photophobia and visual disturbance.  Respiratory: Negative.   Cardiovascular: Negative.   Gastrointestinal: Negative.   Endocrine: Negative for cold intolerance and heat intolerance.  Genitourinary: Positive for decreased urine volume.  Hematological: Does not bruise/bleed easily.  Psychiatric/Behavioral: Negative.       Objective:    Physical Exam  Constitutional: She is oriented to person, place, and time. She appears well-developed and well-nourished. No  distress.  HENT:  Head: Normocephalic and atraumatic.  Right Ear: External ear normal.  Left Ear: External ear normal.  Eyes: Conjunctivae are normal. Right eye exhibits no discharge. Left eye exhibits no discharge. No scleral icterus.  Neck: No JVD present. No tracheal deviation present.  Cardiovascular: Normal rate and regular rhythm.  Murmur heard. Pulmonary/Chest: Effort normal and breath sounds normal. No stridor.  Musculoskeletal:        General: No edema.  Neurological: She is alert and oriented to person, place, and time.  Skin: Skin is warm and dry. She is not diaphoretic.  Psychiatric: She has a normal mood and affect. Her behavior is normal.    BP 134/70   Pulse 88   Ht 5\' 3"  (1.6 m)   SpO2 97%   BMI 20.62 kg/m  Wt Readings from Last 3 Encounters:  07/05/18 116 lb 6.4 oz (52.8 kg)  06/14/18 118 lb (53.5 kg)  05/26/18 118 lb (53.5 kg)   BP Readings from Last 3 Encounters:  11/25/18 134/70  07/05/18 132/74  06/21/18 124/64   Guideline developer:  UpToDate (see UpToDate for funding source) Date Released: June 2014  Health Maintenance Due  Topic Date Due  . Samul Dada  10/25/2018    There are no preventive care reminders to display for this patient.  Lab Results  Component Value Date   TSH 0.49 03/04/2018   Lab Results  Component Value Date   WBC 8.3 09/23/2017   HGB 10.5 (L) 04/13/2018   HCT 31.0 (L) 04/13/2018   MCV 90.8 09/23/2017   PLT 283.0 09/23/2017   Lab Results  Component Value Date   NA 140 04/13/2018   K 4.6 04/13/2018   CO2 16 (L) 02/24/2017   GLUCOSE 90 04/13/2018   BUN 38 (H) 02/24/2017   CREATININE 3.22 (H) 02/24/2017   BILITOT 0.5 02/24/2017   ALKPHOS 54 02/24/2017   AST 23 02/24/2017   ALT 10 (L) 02/24/2017   PROT 6.6 02/24/2017   ALBUMIN 3.5 02/24/2017   CALCIUM 9.6 03/03/2017   ANIONGAP 11 02/24/2017   GFR 14.87 (LL) 02/28/2016   Lab Results  Component Value Date   CHOL 194 03/04/2018   Lab Results  Component  Value Date   HDL 98.70 03/04/2018   Lab Results  Component Value Date   LDLCALC 83 03/04/2018   Lab Results  Component Value Date   TRIG 59.0 03/04/2018   Lab Results  Component Value Date   CHOLHDL 2 03/04/2018   No results found for: HGBA1C    Assessment & Plan:   Problem List Items Addressed This Visit      Endocrine   Hypothyroidism - Primary   Relevant Orders   TSH     Genitourinary   CKD (chronic kidney disease) stage 5, GFR less than 15 ml/min (HCC)   Relevant Orders   CBC   Basic metabolic panel      No orders of the defined types were placed in this encounter.   Follow-up: Return in about 3 months (around 02/25/2019), or if symptoms worsen or fail to improve.   Follow-up in 3 months or sooner as needed.

## 2018-11-26 LAB — CBC
Hematocrit: 28.8 % — ABNORMAL LOW (ref 34.0–46.6)
Hemoglobin: 9.6 g/dL — ABNORMAL LOW (ref 11.1–15.9)
MCH: 30.2 pg (ref 26.6–33.0)
MCHC: 33.3 g/dL (ref 31.5–35.7)
MCV: 91 fL (ref 79–97)
Platelets: 205 10*3/uL (ref 150–450)
RBC: 3.18 x10E6/uL — ABNORMAL LOW (ref 3.77–5.28)
RDW: 12.4 % (ref 11.7–15.4)
WBC: 7.4 10*3/uL (ref 3.4–10.8)

## 2018-11-26 LAB — BASIC METABOLIC PANEL
BUN/Creatinine Ratio: 9 — ABNORMAL LOW (ref 12–28)
BUN: 47 mg/dL — ABNORMAL HIGH (ref 8–27)
CO2: 17 mmol/L — ABNORMAL LOW (ref 20–29)
Calcium: 8.3 mg/dL — ABNORMAL LOW (ref 8.7–10.3)
Chloride: 102 mmol/L (ref 96–106)
Creatinine, Ser: 5.41 mg/dL — ABNORMAL HIGH (ref 0.57–1.00)
GFR calc Af Amer: 8 mL/min/{1.73_m2} — ABNORMAL LOW (ref >59)
GFR calc non Af Amer: 7 mL/min/{1.73_m2} — ABNORMAL LOW (ref >59)
Glucose: 88 mg/dL (ref 65–99)
Potassium: 5.2 mmol/L (ref 3.5–5.2)
Sodium: 138 mmol/L (ref 134–144)

## 2018-11-26 LAB — TSH: TSH: 2.46 u[IU]/mL (ref 0.450–4.500)

## 2018-11-29 ENCOUNTER — Ambulatory Visit (INDEPENDENT_AMBULATORY_CARE_PROVIDER_SITE_OTHER): Payer: Medicare Other | Admitting: *Deleted

## 2018-11-29 DIAGNOSIS — R002 Palpitations: Secondary | ICD-10-CM

## 2018-11-29 DIAGNOSIS — R55 Syncope and collapse: Secondary | ICD-10-CM

## 2018-11-30 LAB — CUP PACEART REMOTE DEVICE CHECK
Battery Remaining Longevity: 131 mo
Battery Remaining Percentage: 95.5 %
Battery Voltage: 3.02 V
Brady Statistic AP VP Percent: 1 %
Brady Statistic AP VS Percent: 5.7 %
Brady Statistic AS VP Percent: 1 %
Brady Statistic AS VS Percent: 93 %
Brady Statistic RA Percent Paced: 5.2 %
Brady Statistic RV Percent Paced: 1 %
Date Time Interrogation Session: 20200727103635
Implantable Lead Implant Date: 20181022
Implantable Lead Implant Date: 20181022
Implantable Lead Location: 753859
Implantable Lead Location: 753860
Implantable Pulse Generator Implant Date: 20181022
Lead Channel Impedance Value: 380 Ohm
Lead Channel Impedance Value: 600 Ohm
Lead Channel Pacing Threshold Amplitude: 0.5 V
Lead Channel Pacing Threshold Amplitude: 0.5 V
Lead Channel Pacing Threshold Pulse Width: 0.5 ms
Lead Channel Pacing Threshold Pulse Width: 0.5 ms
Lead Channel Sensing Intrinsic Amplitude: 11.3 mV
Lead Channel Sensing Intrinsic Amplitude: 3.6 mV
Lead Channel Setting Pacing Amplitude: 0.75 V
Lead Channel Setting Pacing Amplitude: 2 V
Lead Channel Setting Pacing Pulse Width: 0.5 ms
Lead Channel Setting Sensing Sensitivity: 2 mV
Pulse Gen Model: 2272
Pulse Gen Serial Number: 8958692

## 2018-12-17 ENCOUNTER — Encounter: Payer: Self-pay | Admitting: Cardiology

## 2018-12-17 NOTE — Progress Notes (Signed)
Remote pacemaker transmission.   

## 2019-01-16 ENCOUNTER — Other Ambulatory Visit: Payer: Self-pay | Admitting: Family Medicine

## 2019-01-18 ENCOUNTER — Other Ambulatory Visit: Payer: Self-pay | Admitting: Endocrinology

## 2019-01-27 ENCOUNTER — Telehealth: Payer: Self-pay

## 2019-01-27 NOTE — Telephone Encounter (Signed)
Yes, high dose for both.

## 2019-01-27 NOTE — Telephone Encounter (Signed)
Copied from [facility] [id number]. Topic: General - Call Back - No Documentation >> Jan 27, 2019  3:47 PM Erick Blinks wrote: Reason for CRM: Pt called requesting call back with information regarding flu shot. She wants to know if PCP recommends High dosage or standard for her and her husband. Please advise  Best contact [phone]

## 2019-01-28 NOTE — Telephone Encounter (Signed)
Spoke with pt. She is aware for both of them to get the high dose flu shot.

## 2019-02-09 ENCOUNTER — Other Ambulatory Visit: Payer: Self-pay | Admitting: Family Medicine

## 2019-02-14 LAB — HM MAMMOGRAPHY

## 2019-02-16 ENCOUNTER — Encounter: Payer: Self-pay | Admitting: Family Medicine

## 2019-03-01 ENCOUNTER — Ambulatory Visit (INDEPENDENT_AMBULATORY_CARE_PROVIDER_SITE_OTHER): Payer: Medicare Other | Admitting: *Deleted

## 2019-03-01 DIAGNOSIS — R55 Syncope and collapse: Secondary | ICD-10-CM | POA: Diagnosis not present

## 2019-03-01 DIAGNOSIS — I639 Cerebral infarction, unspecified: Secondary | ICD-10-CM

## 2019-03-01 LAB — CUP PACEART REMOTE DEVICE CHECK
Battery Remaining Longevity: 131 mo
Battery Remaining Percentage: 95.5 %
Battery Voltage: 3.02 V
Brady Statistic AP VP Percent: 1 %
Brady Statistic AP VS Percent: 5.8 %
Brady Statistic AS VP Percent: 1.3 %
Brady Statistic AS VS Percent: 93 %
Brady Statistic RA Percent Paced: 5.3 %
Brady Statistic RV Percent Paced: 1.3 %
Date Time Interrogation Session: 20201026060013
Implantable Lead Implant Date: 20181022
Implantable Lead Implant Date: 20181022
Implantable Lead Location: 753859
Implantable Lead Location: 753860
Implantable Pulse Generator Implant Date: 20181022
Lead Channel Impedance Value: 360 Ohm
Lead Channel Impedance Value: 580 Ohm
Lead Channel Pacing Threshold Amplitude: 0.5 V
Lead Channel Pacing Threshold Amplitude: 0.5 V
Lead Channel Pacing Threshold Pulse Width: 0.5 ms
Lead Channel Pacing Threshold Pulse Width: 0.5 ms
Lead Channel Sensing Intrinsic Amplitude: 11.1 mV
Lead Channel Sensing Intrinsic Amplitude: 3.9 mV
Lead Channel Setting Pacing Amplitude: 0.75 V
Lead Channel Setting Pacing Amplitude: 2 V
Lead Channel Setting Pacing Pulse Width: 0.5 ms
Lead Channel Setting Sensing Sensitivity: 2 mV
Pulse Gen Model: 2272
Pulse Gen Serial Number: 8958692

## 2019-03-08 NOTE — Progress Notes (Signed)
Virtual Visit via Video Note  I connected with patient on 03/09/19 at  2:30 PM EST by audio enabled telemedicine application and verified that I am speaking with the correct person using two identifiers.   THIS ENCOUNTER IS A VIRTUAL VISIT DUE TO COVID-19 - PATIENT WAS NOT SEEN IN THE OFFICE. PATIENT HAS CONSENTED TO VIRTUAL VISIT / TELEMEDICINE VISIT   Location of patient: home  Location of provider: office  I discussed the limitations of evaluation and management by telemedicine and the availability of in person appointments. The patient expressed understanding and agreed to proceed.   Subjective:   Katrina Yang is a 78 y.o. female who presents for Medicare Annual (Subsequent) preventive examination.  Review of Systems:  Home Safety/Smoke Alarms: Feels safe in home. Smoke alarms in place.  Lives w/ husband. 2 story w/ basement. Has stair lift. Uses cane. Walk in shower w/ grab bar.    Female:        Mammo- 02/14/19      Dexa scan- active order        CCS- 08/26/16.     Objective:     Vitals: Unable to assess. This visit is enabled though telemedicine due to Covid 19.   Advanced Directives 03/09/2019 05/26/2018 04/13/2018 03/04/2018 10/21/2017 03/03/2017 02/22/2017  Does Patient Have a Medical Advance Directive? Yes No No No Yes - Yes  Type of Paramedic of [place];Living will - - - [place];Living will Living will;Healthcare Power of Attorney Living will;Healthcare Power of Attorney  Does patient want to make changes to medical advance directive? No - Patient declined - - - No - Patient declined - No - Patient declined  Copy of [place] in Chart? Yes - validated most recent copy scanned in chart (See row information) - - - No - copy requested Yes No - copy requested  Would patient like information on creating a medical advance directive? - No - Patient declined No - Patient declined - - - -    Tobacco Social  History   Tobacco Use  Smoking Status Never Smoker  Smokeless Tobacco Never Used     Counseling given: Not Answered   Clinical Intake: Pain : No/denies pain     Past Medical History:  Diagnosis Date  . Anemia   . Bifascicular block   . Carotid bruit    a.  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  . Cervical spondylosis without myelopathy 02/01/2015  . CKD (chronic kidney disease), stage IV ([place])   . Colonic polyp    hx of  . CVA (cerebral vascular accident) ([place])    hx of  . Degenerative joint disease   . Diverticulosis of colon (without mention of hemorrhage)   . History of hysterectomy   . Hypertension    a. Renal artery dopplers in 2012 with no evidence for significant renal artery stenosis.   . Occipital neuralgia of left side 08/30/2013  . Osteoporosis, unspecified   . Palpitations    a. Event monitor (8/14) with no significant abnormalities.   . Peripheral neuropathic pain   . Personal history of unspecified urinary disorder   . Presence of permanent cardiac pacemaker   . Pulmonary nodule   . Pure hypercholesterolemia   . Secondary hyperparathyroidism (of renal origin)   . Unspecified hypothyroidism    Past Surgical History:  Procedure Laterality Date  . ABDOMINAL HYSTERECTOMY    . BACK SURGERY     diskectomy L-5. Multiple back  surgeries  . BASCILIC VEIN TRANSPOSITION Right 12/29/2017   Procedure: FIRST STAGE BASILIC VEIN TRANSPOSITION RIGHT ARM;  Surgeon: Angelia Mould, MD;  Location: [facility];  Service: Vascular;  Laterality: Right;  . BASCILIC VEIN TRANSPOSITION Right 04/13/2018   Procedure: BASILIC VEIN TRANSPOSITION SECOND STAGE;  Surgeon: Angelia Mould, MD;  Location: [facility];  Service: Vascular;  Laterality: Right;  . MENISCECTOMY Left 11/14/15  . PACEMAKER IMPLANT N/A 02/23/2017   Procedure: PACEMAKER IMPLANT;  Surgeon: Evans Lance, MD;  Location: [facility];  Service: Cardiovascular;  Laterality: N/A;   Family History   Problem Relation Age of Onset  . Kidney cancer Father   . Heart attack Father   . Heart disease Father   . Colon cancer Mother   . Ovarian cancer Mother   . Heart disease Mother   . Heart failure Mother   . Stomach cancer Neg Hx    Social History   Socioeconomic History  . Marital status: Married    Spouse name: [name]  . Number of children: 1  . Years of education: 2  . Highest education level: Not on file  Occupational History  . 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 Financial resource strain: Not on file  . Food insecurity    Worry: Not on file    Inability: Not on file  . Transportation needs    Medical: Not on file    Non-medical: Not on file  Tobacco Use  . Smoking status: Never Smoker  . Smokeless tobacco: Never Used  Substance and Sexual Activity  . Alcohol use: No  . Drug use: No  . Sexual activity: Not on file  Lifestyle  . Physical activity    Days per week: Not on file    Minutes per session: Not on file  . Stress: Not on file  Relationships  . Social Herbalist on phone: Not on file    Gets together: Not on file    Attends religious service: Not on file    Active member of club or organization: Not on file    Attends meetings of clubs or organizations: Not on file    Relationship status: Not on file  Other Topics Concern  . Not on file  Social History Narrative   Pt lives at home with her spouse.   Patient drinks 1 glass of tea every other day.   Patient is right handed.    Outpatient Encounter Medications as of 03/09/2019  Medication Sig  . amLODipine (NORVASC) 5 MG tablet TAKE 1 TABLET BY MOUTH TWICE A DAY  . calcitRIOL (ROCALTROL) 0.25 MCG capsule TAKE 1 CAPSULE BY MOUTH EVERY DAY  . clopidogrel (PLAVIX) 75 MG tablet TAKE 1 TABLET BY MOUTH EVERY DAY  . gabapentin (NEURONTIN) 100 MG capsule TAKE 1 CAPSULE BY MOUTH TWICE A DAY  . levothyroxine (SYNTHROID, LEVOTHROID) 88  MCG tablet TAKE 1 TABLET (88 MCG TOTAL) BY MOUTH DAILY BEFORE BREAKFAST.  . metoprolol tartrate (LOPRESSOR) 25 MG tablet TAKE 1 TABLET BY MOUTH TWICE A DAY  . rosuvastatin (CRESTOR) 10 MG tablet TAKE 1/2 TABLET BY MOUTH EVERY DAY   No facility-administered encounter medications on file as of 03/09/2019.     Activities of Daily Living In your present state of health, do you have any difficulty performing the following activities: 03/09/2019 04/13/2018  Hearing? N N  Vision? N N  Difficulty concentrating or making decisions? N N  Walking or climbing stairs? Y N  Comment uses cane -  Dressing or bathing? N N  Doing errands, shopping? N -  Preparing Food and eating ? N -  Using the Toilet? N -  In the past six months, have you accidently leaked urine? N -  Do you have problems with loss of bowel control? N -  Managing your Medications? N -  Managing your Finances? N -  Housekeeping or managing your Housekeeping? 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 -  Some recent data might be hidden    Patient Care Team: Libby Maw, MD as PCP - General (Family Medicine) Love, Alyson Locket, MD (Neurology) Larey Dresser, MD as Consulting Physician (Cardiology) Raina Mina, MD (Nephrology) Kathrynn Ducking, MD as Consulting Physician (Neurology) Dahlia Byes (Ophthalmology) Corliss Parish, MD as Consulting Physician (Nephrology)    Assessment:   This is a routine wellness examination for [place]. Physical assessment deferred to PCP.  Exercise Activities and Dietary recommendations Current Exercise Habits: The patient does not participate in regular exercise at present, Exercise limited by: None identified Diet (meal preparation, eat out, water intake, caffeinated beverages, dairy products, fruits and vegetables): strictly follows Renal diet.   Goals    . "stay strong enough to care for my husband"       Fall Risk Fall Risk  03/09/2019 11/01/2015  Falls in  the past year? 0 Yes  Number falls in past yr: 0 1  Injury with Fall? 0 Yes    Depression Screen PHQ 2/9 Scores 03/09/2019 11/01/2015  PHQ - 2 Score 0 0     Cognitive Function Ad8 score reviewed for issues:  Issues making decisions:no  Less interest in hobbies / activities:no  Repeats questions, stories (family complaining):no  Trouble using ordinary gadgets (microwave, computer, phone):no  Forgets the month or year: no  Mismanaging finances: no  Remembering appts:no  Daily problems with thinking and/or memory:no Ad8 score is=0        Immunization History  Administered Date(s) Administered  . Influenza Split 01/29/2012  . Influenza Whole 02/02/2009, 02/02/2010  . Influenza, High Dose Seasonal PF 02/16/2017, 02/04/2018  . Influenza,inj,Quad PF,6+ Mos 01/19/2013, 01/16/2014, 02/09/2015, 01/04/2016  . Pneumococcal Conjugate-13 01/16/2014  . Pneumococcal Polysaccharide-23 10/04/1999, 01/14/2012  . Td 10/24/2008  . Zoster 01/01/2009   Screening Tests Health Maintenance  Topic Date Due  . TETANUS/TDAP  10/25/2018  . COLONOSCOPY  08/26/2021  . INFLUENZA VACCINE  Completed  . DEXA SCAN  Completed  . PNA vac Low Risk Adult  Completed      Plan:    Please schedule your next medicare wellness visit with me in 1 yr.  Continue to eat heart healthy diet (full of fruits, vegetables, whole grains, lean protein, water--limit salt, fat, and sugar intake) and increase physical activity as tolerated.  Continue doing brain stimulating activities (puzzles, reading, adult coloring books, staying active) to keep memory sharp.     I have personally reviewed and noted the following in the patient's chart:   . Medical and social history . Use of alcohol, tobacco or illicit drugs  . Current medications and supplements . Functional ability and status . Nutritional status . Physical activity . Advanced directives . List of other physicians . Hospitalizations, surgeries, and  ER visits in previous 12 months . Vitals . Screenings to include cognitive, depression, and falls . Referrals and appointments  In addition, I  have reviewed and discussed with patient certain preventive protocols, quality metrics, and best practice recommendations. A written personalized care plan for preventive services as well as general preventive health recommendations were provided to patient.     Shela Nevin, [place]  03/09/2019

## 2019-03-09 ENCOUNTER — Encounter: Payer: Self-pay | Admitting: *Deleted

## 2019-03-09 ENCOUNTER — Ambulatory Visit (INDEPENDENT_AMBULATORY_CARE_PROVIDER_SITE_OTHER): Payer: Medicare Other | Admitting: *Deleted

## 2019-03-09 DIAGNOSIS — Z0000 Encounter for general adult medical examination without abnormal findings: Secondary | ICD-10-CM

## 2019-03-09 NOTE — Patient Instructions (Signed)
Please schedule your next medicare wellness visit with me in 1 yr.  Continue to eat heart healthy diet (full of fruits, vegetables, whole grains, lean protein, water--limit salt, fat, and sugar intake) and increase physical activity as tolerated.  Continue doing brain stimulating activities (puzzles, reading, adult coloring books, staying active) to keep memory sharp.    Katrina Yang , Thank you for taking time to come for your Medicare Wellness Visit. I appreciate your ongoing commitment to your health goals. Please review the following plan we discussed and let me know if I can assist you in the future.   These are the goals we discussed: Goals    . "stay strong enough to care for my husband"       This is a list of the screening recommended for you and due dates:  Health Maintenance  Topic Date Due  . Tetanus Vaccine  10/25/2018  . Colon Cancer Screening  08/26/2021  . Flu Shot  Completed  . DEXA scan (bone density measurement)  Completed  . Pneumonia vaccines  Completed    Preventive Care 74 Years and Older, Female Preventive care refers to lifestyle choices and visits with your health care provider that can promote health and wellness. This includes:  A yearly physical exam. This is also called an annual well check.  Regular dental and eye exams.  Immunizations.  Screening for certain conditions.  Healthy lifestyle choices, such as diet and exercise. What can I expect for my preventive care visit? Physical exam Your health care provider will check:  Height and weight. These may be used to calculate body mass index (BMI), which is a measurement that tells if you are at a healthy weight.  Heart rate and blood pressure.  Your skin for abnormal spots. Counseling Your health care provider may ask you questions about:  Alcohol, tobacco, and drug use.  Emotional well-being.  Home and relationship well-being.  Sexual activity.  Eating habits.  History of falls.   Memory and ability to understand (cognition).  Work and work Statistician.  Pregnancy and menstrual history. What immunizations do I need?  Influenza (flu) vaccine  This is recommended every year. Tetanus, diphtheria, and pertussis (Tdap) vaccine  You may need a Td booster every 10 years. Varicella (chickenpox) vaccine  You may need this vaccine if you have not already been vaccinated. Zoster (shingles) vaccine  You may need this after age 101. Pneumococcal conjugate (PCV13) vaccine  One dose is recommended after age 49. Pneumococcal polysaccharide (PPSV23) vaccine  One dose is recommended after age 83. Measles, mumps, and rubella (MMR) vaccine  You may need at least one dose of MMR if you were born in 1957 or later. You may also need a second dose. Meningococcal conjugate (MenACWY) vaccine  You may need this if you have certain conditions. Hepatitis A vaccine  You may need this if you have certain conditions or if you travel or work in places where you may be exposed to hepatitis A. Hepatitis B vaccine  You may need this if you have certain conditions or if you travel or work in places where you may be exposed to hepatitis B. Haemophilus influenzae type b (Hib) vaccine  You may need this if you have certain conditions. You may receive vaccines as individual doses or as more than one vaccine together in one shot (combination vaccines). Talk with your health care provider about the risks and benefits of combination vaccines. What tests do I need? Blood tests  Lipid  and cholesterol levels. These may be checked every 5 years, or more frequently depending on your overall health.  Hepatitis C test.  Hepatitis B test. Screening  Lung cancer screening. You may have this screening every year starting at age 16 if you have a 30-pack-year history of smoking and currently smoke or have quit within the past 15 years.  Colorectal cancer screening. All adults should have this  screening starting at age 52 and continuing until age 65. Your health care provider may recommend screening at age 47 if you are at increased risk. You will have tests every 1-10 years, depending on your results and the type of screening test.  Diabetes screening. This is done by checking your blood sugar (glucose) after you have not eaten for a while (fasting). You may have this done every 1-3 years.  Mammogram. This may be done every 1-2 years. Talk with your health care provider about how often you should have regular mammograms.  BRCA-related cancer screening. This may be done if you have a family history of breast, ovarian, tubal, or peritoneal cancers. Other tests  Sexually transmitted disease (STD) testing.  Bone density scan. This is done to screen for osteoporosis. You may have this done starting at age 59. Follow these instructions at home: Eating and drinking  Eat a diet that includes fresh fruits and vegetables, whole grains, lean protein, and low-fat dairy products. Limit your intake of foods with high amounts of sugar, saturated fats, and salt.  Take vitamin and mineral supplements as recommended by your health care provider.  Do not drink alcohol if your health care provider tells you not to drink.  If you drink alcohol: ? Limit how much you have to 0-1 drink a day. ? Be aware of how much alcohol is in your drink. In the [place] one drink equals one 12 oz bottle of beer (355 mL), one 5 oz glass of wine (148 mL), or one 1 oz glass of hard liquor (44 mL). Lifestyle  Take daily care of your teeth and gums.  Stay active. Exercise for at least 30 minutes on 5 or more days each week.  Do not use any products that contain nicotine or tobacco, such as cigarettes, e-cigarettes, and chewing tobacco. If you need help quitting, ask your health care provider.  If you are sexually active, practice safe sex. Use a condom or other form of protection in order to prevent STIs (sexually  transmitted infections).  Talk with your health care provider about taking a low-dose aspirin or statin. What's next?  Go to your health care provider once a year for a well check visit.  Ask your health care provider how often you should have your eyes and teeth checked.  Stay up to date on all vaccines. This information is not intended to replace advice given to you by your health care provider. Make sure you discuss any questions you have with your health care provider. Document Released: 05/18/2015 Document Revised: 04/15/2018 Document Reviewed: 04/15/2018 Elsevier Patient Education  2020 [employer].

## 2019-03-20 ENCOUNTER — Other Ambulatory Visit: Payer: Self-pay | Admitting: Neurology

## 2019-03-22 NOTE — Progress Notes (Signed)
Remote pacemaker transmission.   

## 2019-05-12 ENCOUNTER — Telehealth: Payer: Self-pay

## 2019-05-12 ENCOUNTER — Encounter: Payer: Self-pay | Admitting: Gastroenterology

## 2019-05-12 ENCOUNTER — Ambulatory Visit: Payer: Medicare PPO | Admitting: Gastroenterology

## 2019-05-12 VITALS — BP 120/60 | HR 75 | Temp 97.6°F | Ht 63.0 in | Wt 107.0 lb

## 2019-05-12 DIAGNOSIS — M1712 Unilateral primary osteoarthritis, left knee: Secondary | ICD-10-CM | POA: Diagnosis not present

## 2019-05-12 DIAGNOSIS — Z1159 Encounter for screening for other viral diseases: Secondary | ICD-10-CM

## 2019-05-12 DIAGNOSIS — D649 Anemia, unspecified: Secondary | ICD-10-CM

## 2019-05-12 DIAGNOSIS — R1310 Dysphagia, unspecified: Secondary | ICD-10-CM

## 2019-05-12 DIAGNOSIS — Z7902 Long term (current) use of antithrombotics/antiplatelets: Secondary | ICD-10-CM | POA: Diagnosis not present

## 2019-05-12 NOTE — Telephone Encounter (Signed)
[facility] Pre-operative Risk Assessment     Request for surgical clearance:     Endoscopy Procedure  What type of surgery is being performed?     Endoscopy  When is this surgery scheduled?     05/24/2019  What type of clearance is required ?   Pharmacy  Are there any medications that need to be held prior to surgery and how long? Plavix x 5 days  Practice name and name of physician performing surgery?      [facility]  What is your office phone and fax number?  Lemar Lofty    [phone]  [phone]  Anesthesia type (None, local, MAC, general) ?       MAC

## 2019-05-12 NOTE — Telephone Encounter (Signed)
Thanks Jan. Yes, it looks like we would need to ask Dr. Jannifer Franklin, if you don't mind doing that, thanks

## 2019-05-12 NOTE — Telephone Encounter (Signed)
   Primary Cardiologist: Sherren Mocha, MD  Chart reviewed as part of pre-operative protocol coverage. This patient has a history of CVA/TIA, for which she is taking plavix. There are no cardiac contraindications for holding plavix prior to her procedure, however her neurologist should be contacted for further recommendations.   I will route this recommendation to the requesting party via Epic fax function and remove from pre-op pool.  Please call with questions.  Abigail Butts, PA-C 05/12/2019, 4:29 PM

## 2019-05-12 NOTE — Progress Notes (Signed)
HPI :  79 year old female here for follow-up visit.  I know her from prior visit for history of diverticulitis.  We performed a colonoscopy for her in April 2018 as outlined below, she had right-sided, transverse and left-sided colonic diverticulosis.  The main reason she is here today is for dysphagia.  She has had intermittent solid food dysphagia for a fairly long time.  She thinks it might be worsening lately.  This typically occurs with solids, does not occur with liquids.  She feels it occur in her upper chest/throat area.  She has had this before, she had a barium swallow performed in 2012 which showed some mild dysmotility and no evidence of any focal stricture or stenosis.  She is frustrated by it the persistent symptoms over time.  She denies any nausea or vomiting, no reflux or heartburn.  No early satiety or abdominal pains.  She has never had a prior endoscopy.  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  She has had a history of anemia, records of her most recent labs are not on file.  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  Unfortunately her husband passed away last month and she has been coping with this.   Echo 02/23/2017 - EF 60-65%   Colonoscopy 08/26/2016 - The perianal and digital rectal examinations were normal. - Many medium-mouthed diverticula were found in the sigmoid colon, descending colon, transverse colon and ascending colon. No diverticulum were noted in the cecum but were located in the proximal ascending colon. - The terminal ileum appeared normal. - Internal hemorrhoids were found during retroflexion. - The exam was otherwise without abnormality.  Barium swallow 05/30/2010 - mild dysmotility, otherwise normal         Past Medical History:  Diagnosis Date  . Anemia   . Bifascicular block   . Carotid bruit    a.  Duplex 08/2015:  stable 1-39% stenosis.  . Cervical spondylosis without myelopathy 02/01/2015  . CKD (chronic kidney disease), stage IV ([place])   . Colonic polyp    hx of  . CVA (cerebral vascular accident) ([place])    hx of  . Degenerative joint disease   . Diverticulosis of colon (without mention of hemorrhage)   . History of hysterectomy   . Hypertension    a. Renal artery dopplers in 2012 with no evidence for significant renal artery stenosis.   . Occipital neuralgia of left side 08/30/2013  . Osteoporosis, unspecified   . Palpitations    a. Event monitor (8/14) with no significant abnormalities.   . Peripheral neuropathic pain   . Personal history of unspecified urinary disorder   . Presence of permanent cardiac pacemaker   . Pulmonary nodule   . Pure hypercholesterolemia   . Secondary hyperparathyroidism (of renal origin)   . Unspecified hypothyroidism      Past Surgical History:  Procedure Laterality Date  . ABDOMINAL HYSTERECTOMY    . BACK SURGERY     diskectomy L-5. Multiple back surgeries  . BASCILIC VEIN TRANSPOSITION Right 12/29/2017   Procedure: FIRST STAGE BASILIC VEIN TRANSPOSITION RIGHT ARM;  Surgeon: Angelia Mould, MD;  Location: [facility];  Service: Vascular;  Laterality: Right;  . BASCILIC VEIN TRANSPOSITION Right 04/13/2018   Procedure: BASILIC VEIN TRANSPOSITION SECOND STAGE;  Surgeon: Angelia Mould, MD;  Location: [facility];  Service: Vascular;  Laterality: Right;  . MENISCECTOMY Left 11/14/15  . PACEMAKER IMPLANT N/A 02/23/2017   Procedure: PACEMAKER IMPLANT;  Surgeon: Evans Lance, MD;  Location: [facility];  Service: Cardiovascular;  Laterality: N/A;   Family History  Problem Relation Age of Onset  . Kidney cancer Father   . Heart attack Father   . Heart disease Father   . Colon cancer Mother   . Ovarian cancer Mother   . Heart disease Mother   . Heart failure Mother   . Stomach cancer Neg Hx    Social History   Tobacco Use  . Smoking status:  Never Smoker  . Smokeless tobacco: Never Used  Substance Use Topics  . Alcohol use: No  . Drug use: No   Current Outpatient Medications  Medication Sig Dispense Refill  . amLODipine (NORVASC) 5 MG tablet TAKE 1 TABLET BY MOUTH TWICE A DAY 180 tablet 3  . calcitRIOL (ROCALTROL) 0.25 MCG capsule TAKE 1 CAPSULE BY MOUTH EVERY DAY 90 capsule 2  . clopidogrel (PLAVIX) 75 MG tablet TAKE 1 TABLET BY MOUTH EVERY DAY 90 tablet 0  . gabapentin (NEURONTIN) 100 MG capsule TAKE 1 CAPSULE BY MOUTH TWICE A DAY 180 capsule 3  . levothyroxine (SYNTHROID, LEVOTHROID) 88 MCG tablet TAKE 1 TABLET (88 MCG TOTAL) BY MOUTH DAILY BEFORE BREAKFAST. 90 tablet 3  . metoprolol tartrate (LOPRESSOR) 25 MG tablet TAKE 1 TABLET BY MOUTH TWICE A DAY 60 tablet 5  . rosuvastatin (CRESTOR) 10 MG tablet TAKE 1/2 TABLET BY MOUTH EVERY DAY 45 tablet 1   No current facility-administered medications for this visit.   Allergies  Allergen Reactions  . Iodinated Diagnostic Agents Anaphylaxis, Swelling and Other (See Comments)    Throat swelling  . Latex Rash  . Codeine Nausea And Vomiting and Rash  . Lovastatin Nausea Only  . Metronidazole Nausea And Vomiting  . Sulfa Antibiotics Nausea And Vomiting and Rash     Review of Systems: All systems reviewed and negative except where noted in HPI.   Lab Results  Component Value Date   WBC 7.4 11/25/2018   HGB 9.6 (L) 11/25/2018   HCT 28.8 (L) 11/25/2018   MCV 91 11/25/2018   PLT 205 11/25/2018    Lab Results  Component Value Date   CREATININE 5.41 (H) 11/25/2018   BUN 47 (H) 11/25/2018   NA 138 11/25/2018   K 5.2 11/25/2018   CL 102 11/25/2018   CO2 17 (L) 11/25/2018    Lab Results  Component Value Date   ALT 10 (L) 02/24/2017   AST 23 02/24/2017   ALKPHOS 54 02/24/2017   BILITOT 0.5 02/24/2017     Physical Exam: BP 120/60   Pulse 75   Temp 97.6 F (36.4 C)   Ht 5\' 3"  (1.6 m)   Wt 107 lb (48.5 kg)   BMI 18.95 kg/m  Constitutional:  Pleasant,well-developed, female in no acute distress. HEENT: Normocephalic and atraumatic. Conjunctivae are normal. No scleral icterus. Neck supple.  Cardiovascular: Normal rate, regular rhythm.  Pulmonary/chest: Effort normal and breath sounds normal. No wheezing, rales or rhonchi. Abdominal: Soft, nondistended, nontender. . There are no masses palpable.  Extremities: no edema Lymphadenopathy: No cervical adenopathy noted. Neurological: Alert and oriented to person place and time. Skin: Skin is warm and dry. No rashes noted. Psychiatric: Normal mood and affect. Behavior is normal.   ASSESSMENT AND PLAN: 79 year old female here for reassessment of the following:  Dysphagia / antiplatelet therapy -  ongoing intermittent solid food dysphagia.  Prior remote barium study in 2012 showed mild dysmotility.  I discussed differential diagnosis with her.  She has never had a prior upper endoscopy and symptoms continue to bother her.  I offered her an EGD with empiric dilation even if normal to see if we can help provide some relief of the symptoms for her.  I discussed the risks and benefits of EGD and anesthesia with her and she want to proceed.  Given we will plan on performing a dilation will ask for approval to hold the Plavix for 5 days prior to the exam.  She can take a baby aspirin during this time to minimize the risk of recurrent TIA or stroke.  She agreed with the plan, all questions answered.  Anemia - likely due to severe kidney disease.  She has not had a CBC in a while in our system and have requested records from her nephrologist to clarify most recent lab result.  I spent 30 minutes of time, including in depth chart review, independent review of results as outlined above, communicating results with the patient directly, face-to-face time with the patient, coordinating care, and ordering studies and medications as appropriate. Greater than 50% of the time was spent counseling and coordinating  care.     Oasis Cellar, MD [facility]

## 2019-05-12 NOTE — Patient Instructions (Addendum)
If you are age 78 or older, your body mass index should be between 23-30. Your Body mass index is 18.95 kg/m. If this is out of the aforementioned range listed, please consider follow up with your Primary Care Provider.  If you are age 46 or younger, your body mass index should be between 19-25. Your Body mass index is 18.95 kg/m. If this is out of the aformentioned range listed, please consider follow up with your Primary Care Provider.   You will be contacted by our office prior to your procedure for directions on holding your Plavix.  If you do not hear from our office 1 week prior to your scheduled procedure, please call [phone] to discuss.   It is OK for you to take Aspirin 81mg  on the days you hold your Plavix.   We will request your records from [facility].    You have been scheduled for an endoscopy. Please follow written instructions given to you at your visit today. If you use inhalers (even only as needed), please bring them with you on the day of your procedure.  Due to recent changes in healthcare laws, you may see the results of your imaging and laboratory studies on MyChart before your provider has had a chance to review them.  We understand that in some cases there may be results that are confusing or concerning to you. Not all laboratory results come back in the same time frame and the provider may be waiting for multiple results in order to interpret others.  Please give [place] 48 hours in order for your provider to thoroughly review all the results before contacting the office for clarification of your results.

## 2019-05-12 NOTE — Telephone Encounter (Signed)
Dr. Havery Moros - please see message from Dr. Antionette Char office.  Does you want [place] to run by Neurology? Would that be Dr. Andreas Ohm or someone else? Please advise. Thank you

## 2019-05-12 NOTE — Telephone Encounter (Signed)
Letter faxed to Dr. Eugenie Birks:     Katrina Yang 10/26/1940 [id number]  Dear Dr. Andreas Ohm:  We have scheduled the above named patient for a(n) Endoscopy procedure. Our records show that (s)he is on anticoagulation therapy.  Please advise as to whether the patient may come off their therapy of Plavix 5 days  prior to their procedure which is scheduled for 05-24-2019.  Please route your response to Lemar Lofty, CMA or fax response to [phone].  Sincerely,    [facility]

## 2019-05-13 NOTE — Telephone Encounter (Signed)
Letter refaxed to [facility], Dr. Andreas Ohm:  Fax: [phone] and [phone] (phone)

## 2019-05-16 ENCOUNTER — Telehealth: Payer: Self-pay

## 2019-05-16 DIAGNOSIS — D631 Anemia in chronic kidney disease: Secondary | ICD-10-CM | POA: Diagnosis not present

## 2019-05-16 DIAGNOSIS — I120 Hypertensive chronic kidney disease with stage 5 chronic kidney disease or end stage renal disease: Secondary | ICD-10-CM | POA: Diagnosis not present

## 2019-05-16 DIAGNOSIS — N185 Chronic kidney disease, stage 5: Secondary | ICD-10-CM | POA: Diagnosis not present

## 2019-05-16 DIAGNOSIS — E785 Hyperlipidemia, unspecified: Secondary | ICD-10-CM | POA: Diagnosis not present

## 2019-05-16 DIAGNOSIS — N281 Cyst of kidney, acquired: Secondary | ICD-10-CM | POA: Diagnosis not present

## 2019-05-16 DIAGNOSIS — I639 Cerebral infarction, unspecified: Secondary | ICD-10-CM | POA: Diagnosis not present

## 2019-05-16 DIAGNOSIS — Z950 Presence of cardiac pacemaker: Secondary | ICD-10-CM | POA: Diagnosis not present

## 2019-05-16 DIAGNOSIS — N2581 Secondary hyperparathyroidism of renal origin: Secondary | ICD-10-CM | POA: Diagnosis not present

## 2019-05-16 NOTE — Telephone Encounter (Signed)
See [facility] [phone] telephone note.  Pt notified.

## 2019-05-16 NOTE — Telephone Encounter (Signed)
Kathrynn Ducking, MD  Roetta Sessions, CMA  There are no neurologic contraindications to stopping the Plavix 5 to 7 days prior to the scheduled procedure. Please let me know if any other questions arise.

## 2019-05-16 NOTE — Telephone Encounter (Signed)
Pt needs to hold Plavix starting AFTER Wednesday  05-18-19.  Called patient and relayed information.  She expressed understanding and will hold Plavix starting on Thursday of this week.

## 2019-05-16 NOTE — Telephone Encounter (Signed)
-----   Message from Kathrynn Ducking, MD sent at 05/14/2019  4:39 PM EST ----- There are no neurologic contraindications to stopping the Plavix 5 to 7 days prior to the scheduled procedure.  Please let me know if any other questions arise. ----- Message ----- From: Roetta Sessions, CMA Sent: 05/12/2019   6:19 PM EST To: Kathrynn Ducking, MD

## 2019-05-17 DIAGNOSIS — N390 Urinary tract infection, site not specified: Secondary | ICD-10-CM | POA: Diagnosis not present

## 2019-05-20 ENCOUNTER — Other Ambulatory Visit: Payer: Self-pay | Admitting: Gastroenterology

## 2019-05-20 ENCOUNTER — Ambulatory Visit (INDEPENDENT_AMBULATORY_CARE_PROVIDER_SITE_OTHER): Payer: Medicare PPO

## 2019-05-20 DIAGNOSIS — Z1159 Encounter for screening for other viral diseases: Secondary | ICD-10-CM | POA: Diagnosis not present

## 2019-05-23 LAB — SARS CORONAVIRUS 2 (TAT 6-24 HRS): SARS Coronavirus 2: NEGATIVE

## 2019-05-24 ENCOUNTER — Encounter: Payer: Self-pay | Admitting: Gastroenterology

## 2019-05-24 ENCOUNTER — Other Ambulatory Visit: Payer: Self-pay

## 2019-05-24 ENCOUNTER — Ambulatory Visit (AMBULATORY_SURGERY_CENTER): Payer: Medicare PPO | Admitting: Gastroenterology

## 2019-05-24 VITALS — BP 122/52 | HR 67 | Temp 97.6°F | Resp 16 | Ht 63.0 in | Wt 107.0 lb

## 2019-05-24 DIAGNOSIS — K222 Esophageal obstruction: Secondary | ICD-10-CM

## 2019-05-24 DIAGNOSIS — N184 Chronic kidney disease, stage 4 (severe): Secondary | ICD-10-CM | POA: Diagnosis not present

## 2019-05-24 DIAGNOSIS — R1310 Dysphagia, unspecified: Secondary | ICD-10-CM

## 2019-05-24 DIAGNOSIS — K449 Diaphragmatic hernia without obstruction or gangrene: Secondary | ICD-10-CM | POA: Diagnosis not present

## 2019-05-24 DIAGNOSIS — I129 Hypertensive chronic kidney disease with stage 1 through stage 4 chronic kidney disease, or unspecified chronic kidney disease: Secondary | ICD-10-CM | POA: Diagnosis not present

## 2019-05-24 MED ORDER — SODIUM CHLORIDE 0.9 % IV SOLN: 500.0000 mL | Freq: Once | INTRAVENOUS | Status: AC

## 2019-05-24 NOTE — Patient Instructions (Signed)
YOU HAD AN ENDOSCOPIC PROCEDURE TODAY AT [facility]:   Refer to the procedure report that was given to you for any specific questions about what was found during the examination.  If the procedure report does not answer your questions, please call your gastroenterologist to clarify.  If you requested that your care partner not be given the details of your procedure findings, then the procedure report has been included in a sealed envelope for you to review at your convenience later.  YOU SHOULD EXPECT: Some feelings of bloating in the abdomen. Passage of more gas than usual.  Walking can help get rid of the air that was put into your GI tract during the procedure and reduce the bloating. If you had a lower endoscopy (such as a colonoscopy or flexible sigmoidoscopy) you may notice spotting of blood in your stool or on the toilet paper. If you underwent a bowel prep for your procedure, you may not have a normal bowel movement for a few days.  Please Note:  You might notice some irritation and congestion in your nose or some drainage.  This is from the oxygen used during your procedure.  There is no need for concern and it should clear up in a day or so.  SYMPTOMS TO REPORT IMMEDIATELY:   Following upper endoscopy (EGD)  Vomiting of blood or coffee ground material  New chest pain or pain under the shoulder blades  Painful or persistently difficult swallowing  New shortness of breath  Fever of 100F or higher  Black, tarry-looking stools  For urgent or emergent issues, a gastroenterologist can be reached at any hour by calling [phone].   DIET:  Follow a post-dilation diet (see handout given to you by your recovery nurse): Nothing by mouth until 11:00am, Clear liquids only from 11:00am to 1:00pm, then Soft Diet from 1:00pm for the rest of the day today, but then you may proceed to your regular diet tomorrow as tolerated.  Drink plenty of fluids but you should avoid alcoholic  beverages for 24 hours.  MEDICATIONS: Continue present medications. Resume Plavix tomorrow.  Please see handouts given to you by your recovery nurse.  FOLLOW UP: See how you are doing after your dilation today and follow up with Dr. Havery Moros in his office if you symptoms persist or recur.  ACTIVITY:  You should plan to take it easy for the rest of today and you should NOT DRIVE or use heavy machinery until tomorrow (because of the sedation medicines used during the test).    FOLLOW UP: Our staff will call the number listed on your records 48-72 hours following your procedure to check on you and address any questions or concerns that you may have regarding the information given to you following your procedure. If we do not reach you, we will leave a message.  We will attempt to reach you two times.  During this call, we will ask if you have developed any symptoms of COVID 19. If you develop any symptoms (ie: fever, flu-like symptoms, shortness of breath, cough etc.) before then, please call [phone].  If you test positive for Covid 19 in the 2 weeks post procedure, please call and report this information to [place].    If any biopsies were taken you will be contacted by phone or by letter within the next 1-3 weeks.  Please call us at [phone] if you have not heard about the biopsies in 3 weeks.   Thank you for  allowing [place] to provide for your healthcare needs today.   SIGNATURES/CONFIDENTIALITY: You and/or your care partner have signed paperwork which will be entered into your electronic medical record.  These signatures attest to the fact that that the information above on your After Visit Summary has been reviewed and is understood.  Full responsibility of the confidentiality of this discharge information lies with you and/or your care-partner.

## 2019-05-24 NOTE — Progress Notes (Signed)
Report to PACU, RN, vss, BBS= Clear.  

## 2019-05-24 NOTE — Op Note (Signed)
[place] Patient Name: Katrina Yang Procedure Date: 05/24/2019 9:27 AM MRN: [id number] Endoscopist: Remo Lipps P. Havery Moros , MD Age: 79 Referring MD:  Date of Birth: 1940-10-09 Gender: Female Account #: [id number] Procedure:                Upper GI endoscopy Indications:              Dysphagia Medicines:                Monitored Anesthesia Care Procedure:                Pre-Anesthesia Assessment:                           - Prior to the procedure, a History and Physical                            was performed, and patient medications and                            allergies were reviewed. The patient's tolerance of                            previous anesthesia was also reviewed. The risks                            and benefits of the procedure and the sedation                            options and risks were discussed with the patient.                            All questions were answered, and informed consent                            was obtained. Prior Anticoagulants: The patient has                            taken Plavix (clopidogrel), last dose was 5 days                            prior to procedure. ASA Grade Assessment: III - A                            patient with severe systemic disease. After                            reviewing the risks and benefits, the patient was                            deemed in satisfactory condition to undergo the                            procedure.  After obtaining informed consent, the endoscope was                            passed under direct vision. Throughout the                            procedure, the patient's blood pressure, pulse, and                            oxygen saturations were monitored continuously. The                            Endoscope was introduced through the mouth, and                            advanced to the second part of duodenum. The upper                             GI endoscopy was accomplished without difficulty.                            The patient tolerated the procedure well. Scope In: Scope Out: Findings:                 Esophagogastric landmarks were identified: the                            Z-line was found at 37 cm and the gastroesophageal                            junction was found at 37 cm from the incisors.                           A 1 cm hiatal hernia was present.                           There was a suspected subtle stricture entering the                            UES. The exam of the esophagus was otherwise normal.                           A guidewire was placed and the scope was withdrawn.                            Dilation was performed in the entire esophagus with                            a Savary dilator with mild resistance at 17 mm.                            Relook endoscopy showed an appropriate mucosal  wrent just inferior to the UES.                           The entire examined stomach was normal.                           The duodenal bulb and second portion of the                            duodenum were normal. Complications:            No immediate complications. Estimated blood loss:                            Minimal. Estimated Blood Loss:     Estimated blood loss was minimal. Impression:               - Esophagogastric landmarks identified.                           - 1 cm hiatal hernia.                           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                           - Normal stomach.                           - Normal duodenal bulb and second portion of the                            duodenum. Recommendation:           - Patient has a contact number available for                            emergencies. The signs and symptoms of potential                             delayed complications were discussed with the                            patient. Return to normal activities tomorrow.                            Written discharge instructions were provided to the                            patient.                           -  Continue present medications.                           - Resume Plavix tomorrow post dilation                           - Post dilation diet                           - Await course post dilation, follow up as needed                            if symptoms persist / recur Remo Lipps P. Lorette Peterkin, MD 05/24/2019 9:47:17 AM This report has been signed electronically.

## 2019-05-24 NOTE — Progress Notes (Signed)
Called to room to assist during endoscopic procedure.  Patient ID and intended procedure confirmed with present staff. Received instructions for my participation in the procedure from the performing physician.  

## 2019-05-26 ENCOUNTER — Telehealth: Payer: Self-pay

## 2019-05-26 NOTE — Telephone Encounter (Signed)
  Follow up Call-  Call back number 05/24/2019  Post procedure Call Back phone  # [phone]  Permission to leave phone message Yes  Some recent data might be hidden     Patient questions:  Do you have a fever, pain , or abdominal swelling? No. Pain Score  0 *  Have you tolerated food without any problems? Yes.    Have you been able to return to your normal activities? Yes.    Do you have any questions about your discharge instructions: Diet   No. Medications  No. Follow up visit  No.  Do you have questions or concerns about your Care? No.  Actions: * If pain score is 4 or above: No action needed, pain <4.  Pt states she resumed her plavix yesterday as ordered.  1. Have you developed a fever since your procedure? no  2.   Have you had an respiratory symptoms (SOB or cough) since your procedure? no  3.   Have you tested positive for COVID 19 since your procedure no  4.   Have you had any family members/close contacts diagnosed with the COVID 19 since your procedure?  no   If yes to any of these questions please route to Joylene John, RN and Alphonsa Gin, Therapist, sports.

## 2019-05-31 ENCOUNTER — Ambulatory Visit (INDEPENDENT_AMBULATORY_CARE_PROVIDER_SITE_OTHER): Payer: Medicare PPO | Admitting: *Deleted

## 2019-05-31 DIAGNOSIS — R55 Syncope and collapse: Secondary | ICD-10-CM

## 2019-06-01 LAB — CUP PACEART REMOTE DEVICE CHECK
Battery Remaining Longevity: 131 mo
Battery Remaining Percentage: 95.5 %
Battery Voltage: 3.01 V
Brady Statistic AP VP Percent: 1 %
Brady Statistic AP VS Percent: 6 %
Brady Statistic AS VP Percent: 1.7 %
Brady Statistic AS VS Percent: 92 %
Brady Statistic RA Percent Paced: 5.4 %
Brady Statistic RV Percent Paced: 1.8 %
Date Time Interrogation Session: 20210125020017
Implantable Lead Implant Date: 20181022
Implantable Lead Implant Date: 20181022
Implantable Lead Location: 753859
Implantable Lead Location: 753860
Implantable Pulse Generator Implant Date: 20181022
Lead Channel Impedance Value: 360 Ohm
Lead Channel Impedance Value: 560 Ohm
Lead Channel Pacing Threshold Amplitude: 0.5 V
Lead Channel Pacing Threshold Amplitude: 0.5 V
Lead Channel Pacing Threshold Pulse Width: 0.5 ms
Lead Channel Pacing Threshold Pulse Width: 0.5 ms
Lead Channel Sensing Intrinsic Amplitude: 10.2 mV
Lead Channel Sensing Intrinsic Amplitude: 4 mV
Lead Channel Setting Pacing Amplitude: 0.75 V
Lead Channel Setting Pacing Amplitude: 2 V
Lead Channel Setting Pacing Pulse Width: 0.5 ms
Lead Channel Setting Sensing Sensitivity: 2 mV
Pulse Gen Model: 2272
Pulse Gen Serial Number: 8958692

## 2019-06-23 ENCOUNTER — Other Ambulatory Visit: Payer: Self-pay | Admitting: Neurology

## 2019-06-25 ENCOUNTER — Other Ambulatory Visit: Payer: Self-pay | Admitting: Neurology

## 2019-06-30 ENCOUNTER — Other Ambulatory Visit: Payer: Self-pay | Admitting: Neurology

## 2019-06-30 NOTE — Telephone Encounter (Signed)
Left pt a mychart message about a sooner appt with NP. Pt decline and schedule out in June 2021 with MD. Left mychart for pt to respond back. I ask Dr.SEthi work in [place] and he stated pt needs to be seen sooner than JUne 2021 if NP has sooner appt.

## 2019-06-30 NOTE — Telephone Encounter (Signed)
1) Medication(s) Requested (by name): gabapentin (NEURONTIN) 100 MG capsule   2) Pharmacy of Choice: Pharmacy [id number] - [place], [place] [address] [place] [address]     3) Special Requests:   Offered sooner apt with np patient declined and requested MD next avail

## 2019-07-04 ENCOUNTER — Ambulatory Visit: Payer: Medicare Other | Admitting: Adult Health

## 2019-07-05 MED ORDER — GABAPENTIN 100 MG PO CAPS: 100.0000 mg | ORAL_CAPSULE | Freq: Two times a day (BID) | ORAL | 0 refills | Status: DC

## 2019-07-05 NOTE — Telephone Encounter (Signed)
Refill has been sent pending f/u

## 2019-07-05 NOTE — Telephone Encounter (Signed)
Pt has called and the my chart message from Anette Guarneri was read to pt.  Pt has agreed to schedule a f/u with Judson Roch, NP(appointment with Dr Jannifer Franklin was cancelled) Pt is asking if she can now get a refill on her gabapentin (NEURONTIN) 100 MG capsule until the appointment date of 03-16, please call

## 2019-07-05 NOTE — Addendum Note (Signed)
Addended by: Verlin Grills T on: 07/05/2019 11:02 AM   Modules accepted: Orders

## 2019-07-14 ENCOUNTER — Ambulatory Visit (HOSPITAL_COMMUNITY): Payer: Medicare PPO | Attending: Internal Medicine

## 2019-07-14 ENCOUNTER — Other Ambulatory Visit: Payer: Self-pay

## 2019-07-14 DIAGNOSIS — R011 Cardiac murmur, unspecified: Secondary | ICD-10-CM | POA: Insufficient documentation

## 2019-07-14 DIAGNOSIS — M1712 Unilateral primary osteoarthritis, left knee: Secondary | ICD-10-CM | POA: Diagnosis not present

## 2019-07-18 ENCOUNTER — Other Ambulatory Visit: Payer: Self-pay

## 2019-07-18 ENCOUNTER — Ambulatory Visit: Payer: Medicare PPO | Admitting: Cardiovascular Disease

## 2019-07-18 ENCOUNTER — Encounter: Payer: Self-pay | Admitting: Cardiovascular Disease

## 2019-07-18 VITALS — BP 126/76 | HR 73 | Ht 61.5 in | Wt 107.8 lb

## 2019-07-18 DIAGNOSIS — I10 Essential (primary) hypertension: Secondary | ICD-10-CM

## 2019-07-18 DIAGNOSIS — I120 Hypertensive chronic kidney disease with stage 5 chronic kidney disease or end stage renal disease: Secondary | ICD-10-CM | POA: Diagnosis not present

## 2019-07-18 DIAGNOSIS — I350 Nonrheumatic aortic (valve) stenosis: Secondary | ICD-10-CM

## 2019-07-18 DIAGNOSIS — N189 Chronic kidney disease, unspecified: Secondary | ICD-10-CM | POA: Diagnosis not present

## 2019-07-18 DIAGNOSIS — N281 Cyst of kidney, acquired: Secondary | ICD-10-CM | POA: Diagnosis not present

## 2019-07-18 DIAGNOSIS — Z950 Presence of cardiac pacemaker: Secondary | ICD-10-CM

## 2019-07-18 DIAGNOSIS — E785 Hyperlipidemia, unspecified: Secondary | ICD-10-CM | POA: Diagnosis not present

## 2019-07-18 DIAGNOSIS — D631 Anemia in chronic kidney disease: Secondary | ICD-10-CM | POA: Diagnosis not present

## 2019-07-18 DIAGNOSIS — N2581 Secondary hyperparathyroidism of renal origin: Secondary | ICD-10-CM | POA: Diagnosis not present

## 2019-07-18 DIAGNOSIS — I639 Cerebral infarction, unspecified: Secondary | ICD-10-CM | POA: Diagnosis not present

## 2019-07-18 DIAGNOSIS — N185 Chronic kidney disease, stage 5: Secondary | ICD-10-CM | POA: Diagnosis not present

## 2019-07-18 NOTE — Progress Notes (Signed)
Cardiology Office Note:    Date:  07/18/2019   ID:  Katrina Yang, DOB 01/05/1941, MRN [id number]  PCP:  Libby Maw, MD  Cardiologist:  Sherren Mocha, MD  Electrophysiologist:  None   Referring MD: Libby Maw,*   Chief Complaint  Patient presents with  . Shortness of Breath    History of Present Illness:    Katrina Yang is a 79 y.o. female with a hx of bifascicular block and syncope, status post permanent pacemaker placement.  Comorbid conditions include prior stroke, hypertension, mixed hyperlipidemia, and stage V kidney disease.  The patient lost her husband in December after a prolonged illness.  He was also a patient of mine.  They were married for about 60 years.  Over the last few weeks she has noticed progressive shortness of breath and generalized weakness.  She has been followed for advanced kidney disease but is yet to start dialysis.  She complains of weakness and shortness of breath with low-level activity.  She denies resting shortness of breath and has no orthopnea or PND.  She denies abdominal swelling or leg swelling.  She is compliant with her medications.  She has follow-up with nephrology after her office visit here today.  States that she will have labs drawn at the time of that visit.  Past Medical History:  Diagnosis Date  . Anemia   . Bifascicular block   . Carotid bruit    a.  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  . Cervical spondylosis without myelopathy 02/01/2015  . CKD (chronic kidney disease), stage IV ([place])   . Colonic polyp    hx of  . CVA (cerebral vascular accident) ([place])    hx of  . Degenerative joint disease   . Diverticulosis of colon (without mention of hemorrhage)   . History of hysterectomy   . Hypertension    a. Renal artery dopplers in 2012 with no evidence for significant renal artery stenosis.   . Occipital neuralgia of left side 08/30/2013  . Osteoporosis, unspecified   . Palpitations    a. Event  monitor (8/14) with no significant abnormalities.   . Peripheral neuropathic pain   . Personal history of unspecified urinary disorder   . Presence of permanent cardiac pacemaker   . Pulmonary nodule   . Pure hypercholesterolemia   . Secondary hyperparathyroidism (of renal origin)   . Unspecified hypothyroidism     Past Surgical History:  Procedure Laterality Date  . ABDOMINAL HYSTERECTOMY    . BACK SURGERY     diskectomy L-5. Multiple back surgeries  . BASCILIC VEIN TRANSPOSITION Right 12/29/2017   Procedure: FIRST STAGE BASILIC VEIN TRANSPOSITION RIGHT ARM;  Surgeon: Angelia Mould, MD;  Location: [place];  Service: Vascular;  Laterality: Right;  . BASCILIC VEIN TRANSPOSITION Right 04/13/2018   Procedure: BASILIC VEIN TRANSPOSITION SECOND STAGE;  Surgeon: Angelia Mould, MD;  Location: [place];  Service: Vascular;  Laterality: Right;  . MENISCECTOMY Left 11/14/15  . PACEMAKER IMPLANT N/A 02/23/2017   Procedure: PACEMAKER IMPLANT;  Surgeon: Evans Lance, MD;  Location: [place] [facility];  Service: Cardiovascular;  Laterality: N/A;    Current Medications: Current Meds  Medication Sig  . amLODipine (NORVASC) 5 MG tablet TAKE 1 TABLET BY MOUTH TWICE A DAY  . calcitRIOL (ROCALTROL) 0.25 MCG capsule TAKE 1 CAPSULE BY MOUTH EVERY DAY  . clopidogrel (PLAVIX) 75 MG tablet TAKE 1 TABLET BY MOUTH EVERY DAY  . gabapentin (NEURONTIN) 100 MG capsule  Take 100 mg by mouth 2 (two) times daily.  Marland Kitchen levothyroxine (SYNTHROID, LEVOTHROID) 88 MCG tablet TAKE 1 TABLET (88 MCG TOTAL) BY MOUTH DAILY BEFORE BREAKFAST.  . metoprolol tartrate (LOPRESSOR) 25 MG tablet TAKE 1 TABLET BY MOUTH TWICE A DAY  . rosuvastatin (CRESTOR) 10 MG tablet TAKE 1/2 TABLET BY MOUTH EVERY DAY   Current Facility-Administered Medications for the 07/18/19 encounter (Office Visit) with Sherren Mocha, MD  Medication  . 0.9 %  sodium chloride infusion     Allergies:   Iodinated diagnostic agents, Latex, Codeine,  Lovastatin, Metronidazole, and Sulfa antibiotics   Social History   Socioeconomic History  . Marital status: Married    Spouse name: [name]  . Number of children: 1  . Years of education: 2  . Highest education level: Not on file  Occupational History  . Occupation: Retired    Fish farm manager: [employer]    Comment: Metallurgist  Tobacco Use  . Smoking status: Never Smoker  . Smokeless tobacco: Never Used  Substance and Sexual Activity  . Alcohol use: No  . Drug use: No  . Sexual activity: Not on file  Other Topics Concern  . Not on file  Social History Narrative   Pt lives at home with her spouse.   Patient drinks 1 glass of tea every other day.   Patient is right handed.   Social Determinants of Health   Financial Resource Strain:   . Difficulty of Paying Living Expenses:   Food Insecurity:   . Worried About Charity fundraiser in the Last Year:   . Arboriculturist in the Last Year:   Transportation Needs:   . Film/video editor (Medical):   Marland Kitchen Lack of Transportation (Non-Medical):   Physical Activity:   . Days of Exercise per Week:   . Minutes of Exercise per Session:   Stress:   . Feeling of Stress :   Social Connections:   . Frequency of Communication with Friends and Family:   . Frequency of Social Gatherings with Friends and Family:   . Attends Religious Services:   . Active Member of Clubs or Organizations:   . Attends Archivist Meetings:   Marland Kitchen Marital Status:     Family History: The patient's family history includes Colon cancer in her mother; Heart attack in her father; Heart disease in her father and mother; Heart failure in her mother; Kidney cancer in her father; Ovarian cancer in her mother. There is no history of Stomach cancer.  ROS:   Please see the history of present illness.    All other systems reviewed and are negative.  EKGs/Labs/Other Studies Reviewed:    The following studies were reviewed  today: Echo: IMPRESSIONS    1. Left ventricular ejection fraction, by estimation, is 55 to 60%. The left ventricle has normal function. The left ventricle has no regional wall motion abnormalities. Left ventricular diastolic parameters are consistent with Grade I diastolic  dysfunction (impaired relaxation).  2. Right ventricular systolic function is normal. The right ventricular size is normal. There is normal pulmonary artery systolic pressure. The estimated right ventricular systolic pressure is 62.9 mmHg.  3. Left atrial size was moderately dilated.  4. The mitral valve is normal in structure. No evidence of mitral valve regurgitation. No evidence of mitral stenosis.  5. The aortic valve is abnormal. Aortic valve regurgitation is not visualized. Mild to moderate aortic valve sclerosis/calcification is present, without any evidence of aortic stenosis.  6. The inferior vena cava is normal in size with greater than 50% respiratory variability, suggesting right atrial pressure of 3 mmHg.  EKG:  EKG is ordered today.  The ekg ordered today demonstrates A-paced, RBBB, HR 63 bpm  Recent Labs: 11/25/2018: BUN 47; Creatinine, Ser 5.41; Hemoglobin 9.6; Platelets 205; Potassium 5.2; Sodium 138; TSH 2.460  Recent Lipid Panel    Component Value Date/Time   CHOL 194 03/04/2018 1043   TRIG 59.0 03/04/2018 1043   HDL 98.70 03/04/2018 1043   CHOLHDL 2 03/04/2018 1043   VLDL 11.8 03/04/2018 1043   LDLCALC 83 03/04/2018 1043   LDLDIRECT 107.3 02/14/2013 0843    Physical Exam:    VS:  BP 126/76   Pulse 73   Ht 5' 1.5" (1.562 m)   Wt 107 lb 12.8 oz (48.9 kg)   SpO2 96%   BMI 20.04 kg/m     Wt Readings from Last 3 Encounters:  07/18/19 107 lb 12.8 oz (48.9 kg)  05/24/19 107 lb (48.5 kg)  05/12/19 107 lb (48.5 kg)     GEN:  elderly woman in no acute distress, ambulates with a cane HEENT: Normal NECK: No JVD; No carotid bruits LYMPHATICS: No lymphadenopathy CARDIAC: RRR, 2/6 systolic  murmur at the RUSB RESPIRATORY:  Clear to auscultation without rales, wheezing or rhonchi  ABDOMEN: Soft, non-tender, non-distended MUSCULOSKELETAL:  No edema; No deformity  SKIN: Warm and dry NEUROLOGIC:  Alert and oriented x 3 PSYCHIATRIC:  Normal affect   ASSESSMENT:    1. Essential hypertension   2. Cardiac pacemaker in situ   3. Nonrheumatic aortic valve stenosis    PLAN:    In order of problems listed above:  1. Blood pressure is controlled.  As she approaches the need for dialysis I suspect she will need to reduce her antihypertensive medications.  Follow-up with nephrology as planned. 2. Most recent pacemaker check is reviewed.  Normal device function was noted.  The patient is in an atrial paced rhythm today. 3. The patient has aortic valve calcification and sclerosis by echo without significant stenosis.  I reviewed her echo study which showed normal LV and RV function with no significant valvular disease.  Estimated right atrial pressure was normal.  I do not see any clear cardiac-related problems at this time that would account for her worsening shortness of breath and fatigue.  I wonder if she is approaching the need for hemodialysis with worsening kidney disease.  She does not appear to be overtly volume overloaded on my exam today with no symptoms of orthopnea or PND and no edema or clear volume overload on examination.  It is also possible that she has worsening anemia and she will again have labs drawn this afternoon at her nephrology visit.   Medication Adjustments/Labs and Tests Ordered: Current medicines are reviewed at length with the patient today.  Concerns regarding medicines are outlined above.  Orders Placed This Encounter  Procedures  . EKG 12-Lead   No orders of the defined types were placed in this encounter.   Patient Instructions  Medication Instructions:  Your provider recommends that you continue on your current medications as directed. Please  refer to the Current Medication list given to you today.   *If you need a refill on your cardiac medications before your next appointment, please call your pharmacy*  Follow-Up: At [facility], you and your health needs are our priority.  As part of our continuing mission to provide you with exceptional heart care,  we have created designated Provider Care Teams.  These Care Teams include your primary Cardiologist (physician) and Advanced Practice Providers (APPs -  Physician Assistants and Nurse Practitioners) who all work together to provide you with the care you need, when you need it. Your next appointment:   6 month(s) The format for your next appointment:   In Person Provider:   You may see Sherren Mocha, MD or one of the following Advanced Practice Providers on your designated Care Team:    Richardson Dopp, PA-C  Vin Chenega, PA-C  Daune Perch, [place]      Signed, Sherren Mocha, MD  07/18/2019 2:35 PM    [place]

## 2019-07-18 NOTE — Patient Instructions (Signed)
Medication Instructions:  Your provider recommends that you continue on your current medications as directed. Please refer to the Current Medication list given to you today.   *If you need a refill on your cardiac medications before your next appointment, please call your pharmacy*   Follow-Up: At [facility], you and your health needs are our priority.  As part of our continuing mission to provide you with exceptional heart care, we have created designated Provider Care Teams.  These Care Teams include your primary Cardiologist (physician) and Advanced Practice Providers (APPs -  Physician Assistants and Nurse Practitioners) who all work together to provide you with the care you need, when you need it. Your next appointment:   6 month(s) The format for your next appointment:   In Person Provider:   You may see Michael Cooper, MD or one of the following Advanced Practice Providers on your designated Care Team:    Scott Weaver, PA-C  Vin Bhagat, PA-C  Janine Hammond, NP   

## 2019-07-19 ENCOUNTER — Ambulatory Visit: Payer: Medicare Other | Admitting: Neurology

## 2019-07-25 ENCOUNTER — Ambulatory Visit: Payer: Medicare PPO | Admitting: Neurology

## 2019-08-01 ENCOUNTER — Other Ambulatory Visit: Payer: Self-pay | Admitting: Family Medicine

## 2019-08-02 NOTE — Telephone Encounter (Signed)
Appt scheduled

## 2019-08-03 ENCOUNTER — Other Ambulatory Visit: Payer: Self-pay | Admitting: Family Medicine

## 2019-08-17 ENCOUNTER — Other Ambulatory Visit: Payer: Self-pay | Admitting: Endocrinology

## 2019-08-23 ENCOUNTER — Telehealth: Payer: Self-pay | Admitting: Gastroenterology

## 2019-08-23 MED ORDER — AMOXICILLIN-POT CLAVULANATE 500-125 MG PO TABS: 1.0000 | ORAL_TABLET | Freq: Three times a day (TID) | ORAL | 0 refills | Status: DC

## 2019-08-23 NOTE — Telephone Encounter (Signed)
Patient reports 3 days of lower abdominal pain.  She feels this is consistent with diverticulitis.  She has had diarrhea.  Hurts to ambulate and to sit.  Denies fever or other complaints.  Last tx was in 2019.

## 2019-08-23 NOTE — Telephone Encounter (Signed)
Katrina Yang let's treat empirically with Augmentin 500mg  / day for one week (renally dosed for GFR < 10) for diverticulitis. If no improvement she should call back. Thanks

## 2019-08-23 NOTE — Telephone Encounter (Signed)
Pt called back stating that she has a diverticulitis flare and requested medication sent to pharmacy.

## 2019-08-24 NOTE — Telephone Encounter (Signed)
Late entry.  Patient was notified and rx sent in 08/23/19 approximately 1710.  All of her questions were answered and she verbalized understanding.

## 2019-08-30 ENCOUNTER — Ambulatory Visit (INDEPENDENT_AMBULATORY_CARE_PROVIDER_SITE_OTHER): Payer: Medicare PPO | Admitting: *Deleted

## 2019-08-30 DIAGNOSIS — R55 Syncope and collapse: Secondary | ICD-10-CM

## 2019-08-30 LAB — CUP PACEART REMOTE DEVICE CHECK
Battery Remaining Longevity: 131 mo
Battery Remaining Percentage: 95.5 %
Battery Voltage: 3.01 V
Brady Statistic AP VP Percent: 1 %
Brady Statistic AP VS Percent: 6.4 %
Brady Statistic AS VP Percent: 2.1 %
Brady Statistic AS VS Percent: 91 %
Brady Statistic RA Percent Paced: 5.8 %
Brady Statistic RV Percent Paced: 2.2 %
Date Time Interrogation Session: 20210427122545
Implantable Lead Implant Date: 20181022
Implantable Lead Implant Date: 20181022
Implantable Lead Location: 753859
Implantable Lead Location: 753860
Implantable Pulse Generator Implant Date: 20181022
Lead Channel Impedance Value: 380 Ohm
Lead Channel Impedance Value: 600 Ohm
Lead Channel Pacing Threshold Amplitude: 0.5 V
Lead Channel Pacing Threshold Amplitude: 0.5 V
Lead Channel Pacing Threshold Pulse Width: 0.5 ms
Lead Channel Pacing Threshold Pulse Width: 0.5 ms
Lead Channel Sensing Intrinsic Amplitude: 12 mV
Lead Channel Sensing Intrinsic Amplitude: 2.8 mV
Lead Channel Setting Pacing Amplitude: 0.75 V
Lead Channel Setting Pacing Amplitude: 2 V
Lead Channel Setting Pacing Pulse Width: 0.5 ms
Lead Channel Setting Sensing Sensitivity: 2 mV
Pulse Gen Model: 2272
Pulse Gen Serial Number: 8958692

## 2019-08-31 NOTE — Progress Notes (Signed)
PPM Remote  

## 2019-09-01 ENCOUNTER — Ambulatory Visit: Payer: Medicare PPO | Admitting: Family Medicine

## 2019-09-08 DIAGNOSIS — M1712 Unilateral primary osteoarthritis, left knee: Secondary | ICD-10-CM | POA: Diagnosis not present

## 2019-09-08 DIAGNOSIS — M1711 Unilateral primary osteoarthritis, right knee: Secondary | ICD-10-CM | POA: Diagnosis not present

## 2019-09-08 DIAGNOSIS — M170 Bilateral primary osteoarthritis of knee: Secondary | ICD-10-CM | POA: Diagnosis not present

## 2019-09-12 DIAGNOSIS — N185 Chronic kidney disease, stage 5: Secondary | ICD-10-CM | POA: Diagnosis not present

## 2019-09-12 DIAGNOSIS — I639 Cerebral infarction, unspecified: Secondary | ICD-10-CM | POA: Diagnosis not present

## 2019-09-12 DIAGNOSIS — E785 Hyperlipidemia, unspecified: Secondary | ICD-10-CM | POA: Diagnosis not present

## 2019-09-12 DIAGNOSIS — N189 Chronic kidney disease, unspecified: Secondary | ICD-10-CM | POA: Diagnosis not present

## 2019-09-12 DIAGNOSIS — N281 Cyst of kidney, acquired: Secondary | ICD-10-CM | POA: Diagnosis not present

## 2019-09-12 DIAGNOSIS — I120 Hypertensive chronic kidney disease with stage 5 chronic kidney disease or end stage renal disease: Secondary | ICD-10-CM | POA: Diagnosis not present

## 2019-09-12 DIAGNOSIS — D631 Anemia in chronic kidney disease: Secondary | ICD-10-CM | POA: Diagnosis not present

## 2019-09-12 DIAGNOSIS — N2581 Secondary hyperparathyroidism of renal origin: Secondary | ICD-10-CM | POA: Diagnosis not present

## 2019-09-13 ENCOUNTER — Encounter: Payer: Self-pay | Admitting: Neurology

## 2019-09-13 ENCOUNTER — Ambulatory Visit: Payer: Medicare PPO | Admitting: Neurology

## 2019-09-13 VITALS — BP 133/63 | HR 62 | Temp 97.3°F | Ht 61.0 in | Wt 108.0 lb

## 2019-09-13 DIAGNOSIS — M5481 Occipital neuralgia: Secondary | ICD-10-CM

## 2019-09-13 MED ORDER — GABAPENTIN 100 MG PO CAPS: 100.0000 mg | ORAL_CAPSULE | Freq: Two times a day (BID) | ORAL | 3 refills | Status: DC

## 2019-09-13 NOTE — Progress Notes (Signed)
PATIENT: Katrina Yang DOB: 01-12-1941  REASON FOR VISIT: follow up HISTORY FROM: patient  HISTORY OF PRESENT ILLNESS: Today 09/13/19  Katrina Yang is a 79 year old female with history of left occipital neuralgia.  She has history of chronic renal insufficiency, is not yet on hemodialysis, but has a fistula. She is limited on her dose of gabapentin due to her renal issues.  She is currently prescribed gabapentin 100 mg twice a day, but usually only takes 100 mg in the evening.  She finds this is beneficial for the occipital neuralgia, along with pain in her left knee, and foot.  She has a bad left knee, indicates she needs a knee replacement, seeing orthopedics.  Has a cane, but is in the car today.  If she misses the gabapentin, can see a difference in her neck pain.  Her husband passed away in 05-19-23.  No new problems or concerns.  She presents today for evaluation unaccompanied.  HISTORY 03/18/2018 Dr. Jannifer Franklin: Katrina Yang is a 79 year old right-handed white female with a history of chronic renal insufficiency.  The patient is not yet on hemodialysis but she does have a permanent access placed.  The patient is still having some issues with the left occipital neuralgia but she is not taking the prescribed dose of her gabapentin.  She is only taking 100 mg daily, she will take 200 mg day if she is having increased discomfort.  The patient indicates that the pain is oftentimes is worse in the evening hours.  She returns this office for an evaluation.  She has not had any problems with falls, she is usually sleeping fairly well at night.   REVIEW OF SYSTEMS: Out of a complete 14 system review of symptoms, the patient complains only of the following symptoms, and all other reviewed systems are negative.  Knee pain  ALLERGIES: Allergies  Allergen Reactions  . Iodinated Diagnostic Agents Anaphylaxis, Swelling and Other (See Comments)    Throat swelling  . Latex Rash  . Codeine Nausea And  Vomiting and Rash  . Lovastatin Nausea Only  . Metronidazole Nausea And Vomiting  . Sulfa Antibiotics Nausea And Vomiting and Rash    HOME MEDICATIONS: Outpatient Medications Prior to Visit  Medication Sig Dispense Refill  . amLODipine (NORVASC) 5 MG tablet TAKE 1 TABLET BY MOUTH TWICE A DAY 180 tablet 3  . amoxicillin-clavulanate (AUGMENTIN) 500-125 MG tablet Take 1 tablet (500 mg total) by mouth 3 (three) times daily. 7 tablet 0  . calcitRIOL (ROCALTROL) 0.25 MCG capsule TAKE 1 CAPSULE BY MOUTH EVERY DAY 90 capsule 2  . clopidogrel (PLAVIX) 75 MG tablet TAKE 1 TABLET BY MOUTH EVERY DAY 90 tablet 0  . levothyroxine (SYNTHROID, LEVOTHROID) 88 MCG tablet TAKE 1 TABLET (88 MCG TOTAL) BY MOUTH DAILY BEFORE BREAKFAST. 90 tablet 3  . metoprolol tartrate (LOPRESSOR) 25 MG tablet TAKE 1 TABLET BY MOUTH TWICE A DAY 180 tablet 0  . rosuvastatin (CRESTOR) 10 MG tablet TAKE 1/2 TABLET BY MOUTH EVERY DAY 45 tablet 1  . gabapentin (NEURONTIN) 100 MG capsule Take 100 mg by mouth 2 (two) times daily.     Facility-Administered Medications Prior to Visit  Medication Dose Route Frequency Provider Last Rate Last Admin  . 0.9 %  sodium chloride infusion  500 mL Intravenous Once Armbruster, Carlota Raspberry, MD        PAST MEDICAL HISTORY: Past Medical History:  Diagnosis Date  . Anemia   . Bifascicular block   . Carotid bruit  a.  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  . Cervical spondylosis without myelopathy 02/01/2015  . CKD (chronic kidney disease), stage IV ([place])   . Colonic polyp    hx of  . CVA (cerebral vascular accident) ([place])    hx of  . Degenerative joint disease   . Diverticulosis of colon (without mention of hemorrhage)   . History of hysterectomy   . Hypertension    a. Renal artery dopplers in 2012 with no evidence for significant renal artery stenosis.   . Occipital neuralgia of left side 08/30/2013  . Osteoporosis, unspecified   . Palpitations    a. Event monitor (8/14) with no  significant abnormalities.   . Peripheral neuropathic pain   . Personal history of unspecified urinary disorder   . Presence of permanent cardiac pacemaker   . Pulmonary nodule   . Pure hypercholesterolemia   . Secondary hyperparathyroidism (of renal origin)   . Unspecified hypothyroidism     PAST SURGICAL HISTORY: Past Surgical History:  Procedure Laterality Date  . ABDOMINAL HYSTERECTOMY    . BACK SURGERY     diskectomy L-5. Multiple back surgeries  . BASCILIC VEIN TRANSPOSITION Right 12/29/2017   Procedure: FIRST STAGE BASILIC VEIN TRANSPOSITION RIGHT ARM;  Surgeon: Angelia Mould, MD;  Location: [place];  Service: Vascular;  Laterality: Right;  . BASCILIC VEIN TRANSPOSITION Right 04/13/2018   Procedure: BASILIC VEIN TRANSPOSITION SECOND STAGE;  Surgeon: Angelia Mould, MD;  Location: [place];  Service: Vascular;  Laterality: Right;  . MENISCECTOMY Left 11/14/15  . PACEMAKER IMPLANT N/A 02/23/2017   Procedure: PACEMAKER IMPLANT;  Surgeon: Evans Lance, MD;  Location: [place] [facility];  Service: Cardiovascular;  Laterality: N/A;    FAMILY HISTORY: Family History  Problem Relation Age of Onset  . Kidney cancer Father   . Heart attack Father   . Heart disease Father   . Colon cancer Mother   . Ovarian cancer Mother   . Heart disease Mother   . Heart failure Mother   . Stomach cancer Neg Hx     SOCIAL HISTORY: Social History   Socioeconomic History  . Marital status: Married    Spouse name: [name]  . Number of children: 1  . Years of education: 2  . Highest education level: Not on file  Occupational History  . 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 Smoking status: Never Smoker  . Smokeless tobacco: Never Used  Substance and Sexual Activity  . Alcohol use: No  . Drug use: No  . Sexual activity: Not on file  Other Topics Concern  . Not on file  Social History Narrative   Pt lives at home  with her spouse.   Patient drinks 1 glass of tea every other day.   Patient is right handed.   Social Determinants of Health   Financial Resource Strain:   . Difficulty of Paying Living Expenses:   Food Insecurity:   . Worried About Charity fundraiser in the Last Year:   . Arboriculturist in the Last Year:   Transportation Needs:   . Film/video editor (Medical):   Marland Kitchen Lack of Transportation (Non-Medical):   Physical Activity:   . Days of Exercise per Week:   . Minutes of Exercise per Session:   Stress:   . Feeling of Stress :   Social Connections:   . Frequency of Communication with  Friends and Family:   . Frequency of Social Gatherings with Friends and Family:   . Attends Religious Services:   . Active Member of Clubs or Organizations:   . Attends Archivist Meetings:   Marland Kitchen Marital Status:   Intimate Partner Violence:   . Fear of Current or Ex-Partner:   . Emotionally Abused:   Marland Kitchen Physically Abused:   . Sexually Abused:    PHYSICAL EXAM  Vitals:   09/13/19 0943  BP: 133/63  Pulse: 62  Temp: (!) 97.3 F (36.3 C)  Weight: 108 lb (49 kg)  Height: 5\' 1"  (1.549 m)   Body mass index is 20.41 kg/m.  Generalized: Well developed, in no acute distress   Neurological examination  Mentation: Alert oriented to time, place, history taking. Follows all commands speech and language fluent Cranial nerve II-XII: Pupils were equal round reactive to light. Extraocular movements were full, visual field were full on confrontational test. Facial sensation and strength were normal.  Head turning and shoulder shrug  were normal and symmetric. Motor: 5/5 strength of all extremities, left 4/5 hip flexion and knee extension Sensory: Sensory testing is intact to soft touch on all 4 extremities. No evidence of extinction is noted.  Coordination: Cerebellar testing reveals good finger-nose-finger and heel-to-shin bilaterally.  Gait and station: Gait is stable on the left, wearing  left knee sleeve Reflexes: Deep tendon reflexes are symmetric and normal bilaterally.   DIAGNOSTIC DATA (LABS, IMAGING, TESTING) - I reviewed patient records, labs, notes, testing and imaging myself where available.  Lab Results  Component Value Date   WBC 7.4 11/25/2018   HGB 9.6 (L) 11/25/2018   HCT 28.8 (L) 11/25/2018   MCV 91 11/25/2018   PLT 205 11/25/2018      Component Value Date/Time   NA 138 11/25/2018 1425   K 5.2 11/25/2018 1425   CL 102 11/25/2018 1425   CO2 17 (L) 11/25/2018 1425   GLUCOSE 88 11/25/2018 1425   GLUCOSE 90 04/13/2018 0751   BUN 47 (H) 11/25/2018 1425   CREATININE 5.41 (H) 11/25/2018 1425   CALCIUM 8.3 (L) 11/25/2018 1425   CALCIUM 9.4 01/14/2012 0910   PROT 6.6 02/24/2017 0524   ALBUMIN 3.5 02/24/2017 0524   AST 23 02/24/2017 0524   ALT 10 (L) 02/24/2017 0524   ALKPHOS 54 02/24/2017 0524   BILITOT 0.5 02/24/2017 0524   GFRNONAA 7 (L) 11/25/2018 1425   GFRAA 8 (L) 11/25/2018 1425   Lab Results  Component Value Date   CHOL 194 03/04/2018   HDL 98.70 03/04/2018   LDLCALC 83 03/04/2018   LDLDIRECT 107.3 02/14/2013   TRIG 59.0 03/04/2018   CHOLHDL 2 03/04/2018   No results found for: HGBA1C No results found for: VITAMINB12 Lab Results  Component Value Date   TSH 2.460 11/25/2018      ASSESSMENT AND PLAN 79 y.o. year old female  has a past medical history of Anemia, Bifascicular block, Carotid bruit, Cervical spondylosis without myelopathy (02/01/2015), CKD (chronic kidney disease), stage IV ([place]), Colonic polyp, CVA (cerebral vascular accident) ([place]), Degenerative joint disease, Diverticulosis of colon (without mention of hemorrhage), History of hysterectomy, Hypertension, Occipital neuralgia of left side (08/30/2013), Osteoporosis, unspecified, Palpitations, Peripheral neuropathic pain, Personal history of unspecified urinary disorder, Presence of permanent cardiac pacemaker, Pulmonary nodule, Pure hypercholesterolemia, Secondary  hyperparathyroidism (of renal origin), and Unspecified hypothyroidism. here with:  1.  Left occipital neuralgia 2.  Left knee pain  The patient has done well since last seen.  She will remain on gabapentin taking up to 100 mg twice a day, she is on a low-dose of gabapentin secondary to renal insufficiency.  She will follow-up in 1 year or sooner if needed.  I spent 20 minutes of face-to-face and non-face-to-face time with patient.  This included previsit chart review, lab review, study review, order entry, electronic health record documentation, patient education.   Butler Denmark, AGNP-C, DNP 09/13/2019, 10:06 AM [facility] [address], [place], [place] [address] [phone]

## 2019-09-13 NOTE — Progress Notes (Signed)
I have read the note, and I agree with the clinical assessment and plan.  Manya Balash K Rye Dorado   

## 2019-09-13 NOTE — Patient Instructions (Signed)
It was nice to see you today! Continue taking gabapentin as prescribed  See you back in 1 year

## 2019-09-14 ENCOUNTER — Other Ambulatory Visit: Payer: Self-pay

## 2019-09-14 DIAGNOSIS — N390 Urinary tract infection, site not specified: Secondary | ICD-10-CM | POA: Diagnosis not present

## 2019-09-15 ENCOUNTER — Ambulatory Visit: Payer: Medicare PPO | Admitting: Family Medicine

## 2019-09-19 ENCOUNTER — Other Ambulatory Visit: Payer: Self-pay

## 2019-09-19 ENCOUNTER — Encounter: Payer: Self-pay | Admitting: Family Medicine

## 2019-09-19 ENCOUNTER — Ambulatory Visit: Payer: Medicare PPO | Admitting: Family Medicine

## 2019-09-19 VITALS — BP 136/68 | HR 70 | Temp 97.2°F | Ht 61.0 in | Wt 105.6 lb

## 2019-09-19 DIAGNOSIS — E039 Hypothyroidism, unspecified: Secondary | ICD-10-CM

## 2019-09-19 DIAGNOSIS — N185 Chronic kidney disease, stage 5: Secondary | ICD-10-CM

## 2019-09-19 DIAGNOSIS — E875 Hyperkalemia: Secondary | ICD-10-CM | POA: Diagnosis not present

## 2019-09-19 DIAGNOSIS — E7800 Pure hypercholesterolemia, unspecified: Secondary | ICD-10-CM

## 2019-09-19 LAB — COMPREHENSIVE METABOLIC PANEL
ALT: 7 U/L (ref 0–35)
AST: 15 U/L (ref 0–37)
Albumin: 4.1 g/dL (ref 3.5–5.2)
Alkaline Phosphatase: 52 U/L (ref 39–117)
BUN: 46 mg/dL — ABNORMAL HIGH (ref 6–23)
CO2: 17 mEq/L — ABNORMAL LOW (ref 19–32)
Calcium: 9.2 mg/dL (ref 8.4–10.5)
Chloride: 109 mEq/L (ref 96–112)
Creatinine, Ser: 6.63 mg/dL (ref 0.40–1.20)
GFR: 6.04 mL/min — CL (ref >60.00)
Glucose, Bld: 96 mg/dL (ref 70–99)
Potassium: 5.9 mEq/L — ABNORMAL HIGH (ref 3.5–5.1)
Sodium: 140 mEq/L (ref 135–145)
Total Bilirubin: 0.3 mg/dL (ref 0.2–1.2)
Total Protein: 6.3 g/dL (ref 6.0–8.3)

## 2019-09-19 LAB — LIPID PANEL
Cholesterol: 183 mg/dL (ref 0–200)
HDL: 79.4 mg/dL (ref >39.00)
LDL Cholesterol: 88 mg/dL (ref 0–99)
NonHDL: 104.07
Total CHOL/HDL Ratio: 2
Triglycerides: 82 mg/dL (ref 0.0–149.0)
VLDL: 16.4 mg/dL (ref 0.0–40.0)

## 2019-09-19 LAB — CBC
HCT: 28.6 % — ABNORMAL LOW (ref 36.0–46.0)
Hemoglobin: 9.3 g/dL — ABNORMAL LOW (ref 12.0–15.0)
MCHC: 32.4 g/dL (ref 30.0–36.0)
MCV: 97.7 fl (ref 78.0–100.0)
Platelets: 249 10*3/uL (ref 150.0–400.0)
RBC: 2.93 Mil/uL — ABNORMAL LOW (ref 3.87–5.11)
RDW: 14.1 % (ref 11.5–15.5)
WBC: 8.7 10*3/uL (ref 4.0–10.5)

## 2019-09-19 LAB — TSH: TSH: 5.5 u[IU]/mL — ABNORMAL HIGH (ref 0.35–4.50)

## 2019-09-19 NOTE — Progress Notes (Addendum)
Established Patient Office Visit  Subjective:  Patient ID: Katrina Yang, female    DOB: 12/18/1940  Age: 79 y.o. MRN: [id number]  CC:  Chief Complaint  Patient presents with  . Follow-up    Refill/follow up on medication patient fasting for labs. No concerns.     HPI [name] presents for hypertension, elevated cholesterol, hypothyroidism stage IV chronic kidney disease.  Blood pressures been well controlled.  She experienced cardiogenic syncope after developing bi fascicular block while singing in the choir at church.  Has recovered covered now.  Continues to produce urine but has a fistula in place.  Also seeing cardiology and nephrology.  Husband passed recently.  He is in a better place.  He had suffered for some time from multiple chronic medical illnesses.  Past Medical History:  Diagnosis Date  . Anemia   . Bifascicular block   . Carotid bruit    a.  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  . Cervical spondylosis without myelopathy 02/01/2015  . CKD (chronic kidney disease), stage IV ([place])   . Colonic polyp    hx of  . CVA (cerebral vascular accident) ([place])    hx of  . Degenerative joint disease   . Diverticulosis of colon (without mention of hemorrhage)   . History of hysterectomy   . Hypertension    a. Renal artery dopplers in 2012 with no evidence for significant renal artery stenosis.   . Occipital neuralgia of left side 08/30/2013  . Osteoporosis, unspecified   . Palpitations    a. Event monitor (8/14) with no significant abnormalities.   . Peripheral neuropathic pain   . Personal history of unspecified urinary disorder   . Presence of permanent cardiac pacemaker   . Pulmonary nodule   . Pure hypercholesterolemia   . Secondary hyperparathyroidism (of renal origin)   . Unspecified hypothyroidism     Past Surgical History:  Procedure Laterality Date  . ABDOMINAL HYSTERECTOMY    . BACK SURGERY     diskectomy L-5. Multiple back surgeries  . BASCILIC  VEIN TRANSPOSITION Right 12/29/2017   Procedure: FIRST STAGE BASILIC VEIN TRANSPOSITION RIGHT ARM;  Surgeon: Angelia Mould, MD;  Location: [place];  Service: Vascular;  Laterality: Right;  . BASCILIC VEIN TRANSPOSITION Right 04/13/2018   Procedure: BASILIC VEIN TRANSPOSITION SECOND STAGE;  Surgeon: Angelia Mould, MD;  Location: [place];  Service: Vascular;  Laterality: Right;  . MENISCECTOMY Left 11/14/15  . PACEMAKER IMPLANT N/A 02/23/2017   Procedure: PACEMAKER IMPLANT;  Surgeon: Evans Lance, MD;  Location: [place] [facility];  Service: Cardiovascular;  Laterality: N/A;    Family History  Problem Relation Age of Onset  . Kidney cancer Father   . Heart attack Father   . Heart disease Father   . Colon cancer Mother   . Ovarian cancer Mother   . Heart disease Mother   . Heart failure Mother   . Stomach cancer Neg Hx     Social History   Socioeconomic History  . Marital status: Married    Spouse name: [name]  . Number of children: 1  . Years of education: 2  . Highest education level: Not on file  Occupational History  . 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 Smoking status: Never Smoker  . Smokeless tobacco: Never Used  Substance and Sexual Activity  . Alcohol use: No  . Drug use: No  .  Sexual activity: Not on file  Other Topics Concern  . Not on file  Social History Narrative   Pt lives at home with her spouse.   Patient drinks 1 glass of tea every other day.   Patient is right handed.   Social Determinants of Health   Financial Resource Strain:   . Difficulty of Paying Living Expenses:   Food Insecurity:   . Worried About Charity fundraiser in the Last Year:   . Arboriculturist in the Last Year:   Transportation Needs:   . Film/video editor (Medical):   Marland Kitchen Lack of Transportation (Non-Medical):   Physical Activity:   . Days of Exercise per Week:   . Minutes of Exercise per Session:    Stress:   . Feeling of Stress :   Social Connections:   . Frequency of Communication with Friends and Family:   . Frequency of Social Gatherings with Friends and Family:   . Attends Religious Services:   . Active Member of Clubs or Organizations:   . Attends Archivist Meetings:   Marland Kitchen Marital Status:   Intimate Partner Violence:   . Fear of Current or Ex-Partner:   . Emotionally Abused:   Marland Kitchen Physically Abused:   . Sexually Abused:     Outpatient Medications Prior to Visit  Medication Sig Dispense Refill  . amoxicillin-clavulanate (AUGMENTIN) 500-125 MG tablet Take 1 tablet (500 mg total) by mouth 3 (three) times daily. 7 tablet 0  . calcitRIOL (ROCALTROL) 0.25 MCG capsule TAKE 1 CAPSULE BY MOUTH EVERY DAY 90 capsule 2  . clopidogrel (PLAVIX) 75 MG tablet TAKE 1 TABLET BY MOUTH EVERY DAY 90 tablet 0  . gabapentin (NEURONTIN) 100 MG capsule Take 1 capsule (100 mg total) by mouth 2 (two) times daily. 180 capsule 3  . metoprolol tartrate (LOPRESSOR) 25 MG tablet TAKE 1 TABLET BY MOUTH TWICE A DAY 180 tablet 0  . rosuvastatin (CRESTOR) 10 MG tablet TAKE 1/2 TABLET BY MOUTH EVERY DAY 45 tablet 1  . amLODipine (NORVASC) 5 MG tablet TAKE 1 TABLET BY MOUTH TWICE A DAY 180 tablet 3  . levothyroxine (SYNTHROID, LEVOTHROID) 88 MCG tablet TAKE 1 TABLET (88 MCG TOTAL) BY MOUTH DAILY BEFORE BREAKFAST. 90 tablet 3   Facility-Administered Medications Prior to Visit  Medication Dose Route Frequency Provider Last Rate Last Admin  . 0.9 %  sodium chloride infusion  500 mL Intravenous Once Armbruster, Carlota Raspberry, MD        Allergies  Allergen Reactions  . Iodinated Diagnostic Agents Anaphylaxis, Swelling and Other (See Comments)    Throat swelling  . Latex Rash  . Codeine Nausea And Vomiting and Rash  . Lovastatin Nausea Only  . Metronidazole Nausea And Vomiting  . Sulfa Antibiotics Nausea And Vomiting and Rash    ROS Review of Systems  Constitutional: Negative.   HENT: Negative.    Eyes: Negative for photophobia and visual disturbance.  Respiratory: Negative.   Cardiovascular: Negative.   Gastrointestinal: Negative.   Endocrine: Negative for polyphagia and polyuria.  Genitourinary: Negative for decreased urine volume and difficulty urinating.  Musculoskeletal: Negative.   Skin: Negative for pallor and rash.  Allergic/Immunologic: Negative for immunocompromised state.  Neurological: Negative for light-headedness and numbness.  Hematological: Does not bruise/bleed easily.  Psychiatric/Behavioral: Negative.       Objective:    Physical Exam  Constitutional: She is oriented to person, place, and time. She appears well-developed and well-nourished. No distress.  HENT:  Head: Normocephalic and atraumatic.  Right Ear: External ear normal.  Left Ear: External ear normal.  Eyes: Conjunctivae are normal. Right eye exhibits no discharge. Left eye exhibits no discharge. No scleral icterus.  Neck: No JVD present. No tracheal deviation present.  Cardiovascular: Normal rate, regular rhythm and normal heart sounds.  Pulmonary/Chest: Effort normal and breath sounds normal. No stridor.  Abdominal: Bowel sounds are normal.  Musculoskeletal:        General: No edema.  Neurological: She is alert and oriented to person, place, and time.  Skin: Skin is warm and dry. She is not diaphoretic.  Psychiatric: She has a normal mood and affect. Her behavior is normal.    BP 136/68   Pulse 70   Temp (!) 97.2 F (36.2 C) (Tympanic)   Ht 5\' 1"  (1.549 m)   Wt 105 lb 9.6 oz (47.9 kg)   SpO2 98%   BMI 19.95 kg/m  Wt Readings from Last 3 Encounters:  09/19/19 105 lb 9.6 oz (47.9 kg)  09/13/19 108 lb (49 kg)  07/18/19 107 lb 12.8 oz (48.9 kg)     Health Maintenance Due  Topic Date Due  . TETANUS/TDAP  10/25/2018    There are no preventive care reminders to display for this patient.  Lab Results  Component Value Date   TSH 5.50 (H) 09/19/2019   Lab Results  Component  Value Date   WBC 8.7 09/19/2019   HGB 9.3 (L) 09/19/2019   HCT 28.6 (L) 09/19/2019   MCV 97.7 09/19/2019   PLT 249.0 09/19/2019   Lab Results  Component Value Date   NA 140 09/19/2019   K 5.9 No hemolysis seen (H) 09/19/2019   CO2 17 (L) 09/19/2019   GLUCOSE 96 09/19/2019   BUN 46 (H) 09/19/2019   CREATININE 6.63 (HH) 09/19/2019   BILITOT 0.3 09/19/2019   ALKPHOS 52 09/19/2019   AST 15 09/19/2019   ALT 7 09/19/2019   PROT 6.3 09/19/2019   ALBUMIN 4.1 09/19/2019   CALCIUM 9.2 09/19/2019   ANIONGAP 11 02/24/2017   GFR 6.04 (LL) 09/19/2019   Lab Results  Component Value Date   CHOL 183 09/19/2019   Lab Results  Component Value Date   HDL 79.40 09/19/2019   Lab Results  Component Value Date   LDLCALC 88 09/19/2019   Lab Results  Component Value Date   TRIG 82.0 09/19/2019   Lab Results  Component Value Date   CHOLHDL 2 09/19/2019   No results found for: HGBA1C    Assessment & Plan:   Problem List Items Addressed This Visit      Endocrine   Hypothyroidism   Relevant Medications   levothyroxine (SYNTHROID) 100 MCG tablet   Other Relevant Orders   TSH (Completed)     Genitourinary   CKD (chronic kidney disease) stage 5, GFR less than 15 ml/min (HCC)   Relevant Orders   Comprehensive metabolic panel (Completed)     Other   Elevated cholesterol - Primary   Relevant Orders   CBC (Completed)   Comprehensive metabolic panel (Completed)   Lipid panel (Completed)   Hyperkalemia      Meds ordered this encounter  Medications  . levothyroxine (SYNTHROID) 100 MCG tablet    Sig: Take 1 tablet (100 mcg total) by mouth daily.    Dispense:  90 tablet    Refill:  3    Follow-up: Return in about 6 months (around 03/21/2020).    Libby Maw, MD

## 2019-09-20 ENCOUNTER — Other Ambulatory Visit: Payer: Self-pay | Admitting: Family Medicine

## 2019-09-20 DIAGNOSIS — E875 Hyperkalemia: Secondary | ICD-10-CM | POA: Insufficient documentation

## 2019-09-20 MED ORDER — LEVOTHYROXINE SODIUM 100 MCG PO TABS: 100.0000 ug | ORAL_TABLET | Freq: Every day | ORAL | 3 refills | Status: DC

## 2019-09-20 NOTE — Addendum Note (Signed)
Addended by: Jon Billings on: 09/20/2019 10:56 AM   Modules accepted: Orders

## 2019-10-13 ENCOUNTER — Ambulatory Visit: Payer: Medicare Other | Admitting: Neurology

## 2019-10-13 DIAGNOSIS — M1712 Unilateral primary osteoarthritis, left knee: Secondary | ICD-10-CM | POA: Diagnosis not present

## 2019-10-23 ENCOUNTER — Other Ambulatory Visit: Payer: Self-pay | Admitting: Endocrinology

## 2019-10-23 ENCOUNTER — Other Ambulatory Visit: Payer: Self-pay | Admitting: Family Medicine

## 2019-10-23 NOTE — Telephone Encounter (Signed)
Please forward refill request to pt's primary care provider.   

## 2019-10-24 ENCOUNTER — Other Ambulatory Visit: Payer: Self-pay

## 2019-10-24 ENCOUNTER — Other Ambulatory Visit: Payer: Self-pay | Admitting: Neurology

## 2019-10-24 ENCOUNTER — Telehealth: Payer: Self-pay | Admitting: Family Medicine

## 2019-10-24 DIAGNOSIS — I779 Disorder of arteries and arterioles, unspecified: Secondary | ICD-10-CM

## 2019-10-24 DIAGNOSIS — M810 Age-related osteoporosis without current pathological fracture: Secondary | ICD-10-CM

## 2019-10-24 MED ORDER — CLOPIDOGREL BISULFATE 75 MG PO TABS: 75.0000 mg | ORAL_TABLET | Freq: Every day | ORAL | 3 refills | Status: AC

## 2019-10-24 MED ORDER — ROSUVASTATIN CALCIUM 10 MG PO TABS: 5.0000 mg | ORAL_TABLET | Freq: Every day | ORAL | 1 refills | Status: DC

## 2019-10-24 MED ORDER — CALCITRIOL 0.25 MCG PO CAPS: ORAL_CAPSULE | ORAL | 1 refills | Status: DC

## 2019-10-24 NOTE — Telephone Encounter (Signed)
Pt called and said her pharmacy said they hadnt received anything from [place] for a refill on  1. calcitRIOL (ROCALTROL) 0.25 MCG capsule 2. rosuvastatin (CRESTOR) 10 MG tablet  Pt wanted to know if we could send in refill to [facility] on [address] [phone]

## 2019-10-24 NOTE — Telephone Encounter (Signed)
Pt called needing a refill on her clopidogrel (PLAVIX) 75 MG tablet sent in to the [facility] on [address].

## 2019-10-27 DIAGNOSIS — M1712 Unilateral primary osteoarthritis, left knee: Secondary | ICD-10-CM | POA: Diagnosis not present

## 2019-10-27 NOTE — Telephone Encounter (Signed)
Rx sent in patient have picked up.

## 2019-11-15 DIAGNOSIS — N189 Chronic kidney disease, unspecified: Secondary | ICD-10-CM | POA: Diagnosis not present

## 2019-11-15 DIAGNOSIS — N185 Chronic kidney disease, stage 5: Secondary | ICD-10-CM | POA: Diagnosis not present

## 2019-11-15 DIAGNOSIS — I120 Hypertensive chronic kidney disease with stage 5 chronic kidney disease or end stage renal disease: Secondary | ICD-10-CM | POA: Diagnosis not present

## 2019-11-15 DIAGNOSIS — D631 Anemia in chronic kidney disease: Secondary | ICD-10-CM | POA: Diagnosis not present

## 2019-11-15 DIAGNOSIS — N390 Urinary tract infection, site not specified: Secondary | ICD-10-CM | POA: Diagnosis not present

## 2019-11-15 DIAGNOSIS — N2581 Secondary hyperparathyroidism of renal origin: Secondary | ICD-10-CM | POA: Diagnosis not present

## 2019-11-29 ENCOUNTER — Ambulatory Visit (INDEPENDENT_AMBULATORY_CARE_PROVIDER_SITE_OTHER): Payer: Medicare PPO | Admitting: *Deleted

## 2019-11-29 DIAGNOSIS — R55 Syncope and collapse: Secondary | ICD-10-CM | POA: Diagnosis not present

## 2019-11-30 DIAGNOSIS — H40003 Preglaucoma, unspecified, bilateral: Secondary | ICD-10-CM | POA: Diagnosis not present

## 2019-11-30 DIAGNOSIS — H25813 Combined forms of age-related cataract, bilateral: Secondary | ICD-10-CM | POA: Diagnosis not present

## 2019-11-30 LAB — CUP PACEART REMOTE DEVICE CHECK
Battery Remaining Longevity: 130 mo
Battery Remaining Percentage: 95.5 %
Battery Voltage: 3.02 V
Brady Statistic AP VP Percent: 1 %
Brady Statistic AP VS Percent: 6.5 %
Brady Statistic AS VP Percent: 5 %
Brady Statistic AS VS Percent: 88 %
Brady Statistic RA Percent Paced: 6.1 %
Brady Statistic RV Percent Paced: 5.2 %
Date Time Interrogation Session: 20210727101159
Implantable Lead Implant Date: 20181022
Implantable Lead Implant Date: 20181022
Implantable Lead Location: 753859
Implantable Lead Location: 753860
Implantable Pulse Generator Implant Date: 20181022
Lead Channel Impedance Value: 360 Ohm
Lead Channel Impedance Value: 560 Ohm
Lead Channel Pacing Threshold Amplitude: 0.5 V
Lead Channel Pacing Threshold Amplitude: 0.5 V
Lead Channel Pacing Threshold Pulse Width: 0.5 ms
Lead Channel Pacing Threshold Pulse Width: 0.5 ms
Lead Channel Sensing Intrinsic Amplitude: 12 mV
Lead Channel Sensing Intrinsic Amplitude: 2.4 mV
Lead Channel Setting Pacing Amplitude: 0.75 V
Lead Channel Setting Pacing Amplitude: 2 V
Lead Channel Setting Pacing Pulse Width: 0.5 ms
Lead Channel Setting Sensing Sensitivity: 2 mV
Pulse Gen Model: 2272
Pulse Gen Serial Number: 8958692

## 2019-12-02 NOTE — Progress Notes (Signed)
Remote pacemaker transmission.   

## 2019-12-06 DIAGNOSIS — M1712 Unilateral primary osteoarthritis, left knee: Secondary | ICD-10-CM | POA: Diagnosis not present

## 2020-01-09 NOTE — Progress Notes (Signed)
Cardiology Office Note:    Date:  01/10/2020   ID:  Katrina Yang, DOB 1940/10/27, MRN [id number]  PCP:  Libby Maw, MD  [facility] Cardiologist:  Sherren Mocha, MD   [facility] Electrophysiologist:  Virl Axe, MD   Referring MD: Libby Maw,*   Chief Complaint:  Follow-up (HTN, Aortic Sclerosis )    Patient Profile:    Katrina Yang is a 79 y.o. female with:   Bifascicular block and syncope; s/p Pacemaker  Aortic sclerosis  Hx of CVA  Hypertension   Hyperlipidemia   Chronic kidney disease stage V  Carotid stenosis  Hypothyroidism   Prior CV studies: Echocardiogram 07/14/2019 EF 55-60, normal wall motion, GR 1 DD, normal RVSF, RVSP 22.9, moderate LAE, mild-moderate aortic valve sclerosis  ABI 10/05/2018 Normal  Carotid US 02/23/2017 Bilateral ICA 1-39  Myoview 07/17/2009 EF 72, no ischemia  History of Present Illness:    Katrina Yang was last seen by Dr. Burt Knack in 3/21.  She returns for follow up.  She is here alone.  Her husband passed away earlier this year Katrina Yang).  I had seen him several times for Dr. Burt Knack in the past.  She has been doing fairly well. She has not had significant shortness of breath.  She has not had orthopnea, syncope.  She has some pedal edema but this improves with elevation.  She has some L sided chest pain with exertion.  This is mild and she has noted this for the past 2 years.  There has been no significant change.  She has not assoc symptoms.        Past Medical History:  Diagnosis Date  . Anemia   . Bifascicular block   . Carotid bruit    a.  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  . Cervical spondylosis without myelopathy 02/01/2015  . CKD (chronic kidney disease), stage IV ([place])   . Colonic polyp    hx of  . CVA (cerebral vascular accident) ([place])    hx of  . Degenerative joint disease   . Diverticulosis of colon (without mention of hemorrhage)   . History of hysterectomy   . Hypertension      a. Renal artery dopplers in 2012 with no evidence for significant renal artery stenosis.   . Occipital neuralgia of left side 08/30/2013  . Osteoporosis, unspecified   . Palpitations    a. Event monitor (8/14) with no significant abnormalities.   . Peripheral neuropathic pain   . Personal history of unspecified urinary disorder   . Presence of permanent cardiac pacemaker   . Pulmonary nodule   . Pure hypercholesterolemia   . Secondary hyperparathyroidism (of renal origin)   . Unspecified hypothyroidism     Current Medications: Current Meds  Medication Sig  . amLODipine (NORVASC) 5 MG tablet TAKE 1 TABLET BY MOUTH TWICE A DAY  . calcitRIOL (ROCALTROL) 0.25 MCG capsule TAKE 1 CAPSULE BY MOUTH EVERY DAY  . clopidogrel (PLAVIX) 75 MG tablet Take 1 tablet (75 mg total) by mouth daily.  Marland Kitchen gabapentin (NEURONTIN) 100 MG capsule Take 100 mg by mouth daily.  Marland Kitchen levothyroxine (SYNTHROID) 100 MCG tablet Take 1 tablet (100 mcg total) by mouth daily.  . metoprolol tartrate (LOPRESSOR) 25 MG tablet TAKE 1 TABLET BY MOUTH TWICE A DAY  . rosuvastatin (CRESTOR) 10 MG tablet Take 0.5 tablets (5 mg total) by mouth daily.   Current Facility-Administered Medications for the 01/10/20 encounter (Office Visit) with Liliane Shi, PA-C  Medication  . 0.9 %  sodium chloride infusion     Allergies:   Iodinated diagnostic agents, Latex, Codeine, Lovastatin, Metronidazole, and Sulfa antibiotics   Social History   Tobacco Use  . Smoking status: Never Smoker  . Smokeless tobacco: Never Used  Vaping Use  . Vaping Use: Never used  Substance Use Topics  . Alcohol use: No  . Drug use: No     Family Hx: The patient's family history includes Colon cancer in her mother; Heart attack in her father; Heart disease in her father and mother; Heart failure in her mother; Kidney cancer in her father; Ovarian cancer in her mother. There is no history of Stomach cancer.  Review of Systems  Musculoskeletal:  Positive for joint pain (L knee arthritis).     EKGs/Labs/Other Test Reviewed:    EKG:  EKG is   ordered today.  The ekg ordered today demonstrates normal sinus rhythm, HR 71, RAD, RBBB, QTc 471, non-specific ST-TW changes   Recent Labs: 09/19/2019: ALT 7; BUN 46; Creatinine, Ser 6.63; Hemoglobin 9.3; Platelets 249.0; Potassium 5.9 No hemolysis seen; Sodium 140; TSH 5.50   Recent Lipid Panel Lab Results  Component Value Date/Time   CHOL 183 09/19/2019 09:16 AM   TRIG 82.0 09/19/2019 09:16 AM   HDL 79.40 09/19/2019 09:16 AM   CHOLHDL 2 09/19/2019 09:16 AM   LDLCALC 88 09/19/2019 09:16 AM   LDLDIRECT 107.3 02/14/2013 08:43 AM    Physical Exam:    VS:  BP (!) 150/78   Pulse 71   Ht 5\' 1"  (1.549 m)   Wt 106 lb (48.1 kg)   SpO2 97%   BMI 20.03 kg/m     Wt Readings from Last 3 Encounters:  01/10/20 106 lb (48.1 kg)  09/19/19 105 lb 9.6 oz (47.9 kg)  09/13/19 108 lb (49 kg)     Constitutional:      Appearance: Healthy appearance. Not in distress.  Neck:     Vascular: JVD normal.  Pulmonary:     Effort: Pulmonary effort is normal.     Breath sounds: No wheezing. No rales.  Cardiovascular:     Normal rate. Regular rhythm. Normal S1. Normal S2.     Murmurs: There is a grade 1/6 systolic murmur at the URSB.  Edema:    Feet: bilateral trace pitting edema of the feet. Abdominal:     Palpations: Abdomen is soft.  Skin:    General: Skin is warm and dry.  Neurological:     Mental Status: Alert and oriented to person, place and time.     Cranial Nerves: Cranial nerves are intact.       ASSESSMENT & PLAN:    1. Essential hypertension Her BP is above goal.  She is not certain if this is related to the pain in her knee.  I have asked her to monitor her blood pressure over the next couple weeks and send me those readings for review.  If her pressure remains above target, we can add hydralazine or nitrates to her medical regimen.  2. Mixed hyperlipidemia LDL optimal on most  recent lab work.  Continue current Rx.    3. Aortic valve sclerosis Mild to moderate valve sclerosis by most recent echocardiogram.  There is no significant aortic stenosis.  4. CKD (chronic kidney disease) stage 5, GFR less than 15 ml/min (HCC) She has an AV fistula in her right arm.  She has not yet progressed to needing dialysis.  She is followed  closely by nephrology.    5. Other chest pain She has occasional left-sided chest discomfort.  This has been chronic over the past couple of years without significant change.  Given her advanced chronic kidney disease, she would not be a good candidate for cardiac catheterization at this time.  I have advised her to contact us if her symptoms should change or progress over time.  Continue med Rx.      Dispo:  Return in about 6 months (around 07/09/2020) for Routine Follow Up, w/ Dr. Burt Knack, or Richardson Dopp, PA-C, in person.   Medication Adjustments/Labs and Tests Ordered: Current medicines are reviewed at length with the patient today.  Concerns regarding medicines are outlined above.  Tests Ordered: Orders Placed This Encounter  Procedures  . EKG 12-Lead   Medication Changes: No orders of the defined types were placed in this encounter.   Signed, Richardson Dopp, PA-C  01/10/2020 2:25 PM    [facility] [address], [place], [place]  [address] Phone: [phone]; Fax: [phone]

## 2020-01-10 ENCOUNTER — Encounter: Payer: Self-pay | Admitting: Physician Assistant

## 2020-01-10 ENCOUNTER — Other Ambulatory Visit: Payer: Self-pay

## 2020-01-10 ENCOUNTER — Ambulatory Visit: Payer: Medicare PPO | Admitting: Physician Assistant

## 2020-01-10 VITALS — BP 150/78 | HR 71 | Ht 61.0 in | Wt 106.0 lb

## 2020-01-10 DIAGNOSIS — N185 Chronic kidney disease, stage 5: Secondary | ICD-10-CM | POA: Diagnosis not present

## 2020-01-10 DIAGNOSIS — I358 Other nonrheumatic aortic valve disorders: Secondary | ICD-10-CM

## 2020-01-10 DIAGNOSIS — R0789 Other chest pain: Secondary | ICD-10-CM

## 2020-01-10 DIAGNOSIS — I10 Essential (primary) hypertension: Secondary | ICD-10-CM | POA: Diagnosis not present

## 2020-01-10 DIAGNOSIS — E782 Mixed hyperlipidemia: Secondary | ICD-10-CM

## 2020-01-10 NOTE — Patient Instructions (Addendum)
Medication Instructions:  Your physician recommends that you continue on your current medications as directed. Please refer to the Current Medication list given to you today.  *If you need a refill on your cardiac medications before your next appointment, please call your pharmacy*  Lab Work: None ordered today  If you have labs (blood work) drawn today and your tests are completely normal, you will receive your results only by: Marland Kitchen MyChart Message (if you have MyChart) OR . A paper copy in the mail If you have any lab test that is abnormal or we need to change your treatment, we will call you to review the results.  Testing/Procedures: None ordered today  Follow-Up: On 07/20/2020 at 2:00PM with Sherren Mocha, MD  Other Instructions Check blood pressure once a day for 2 weeks and send reading through my chart

## 2020-01-16 ENCOUNTER — Other Ambulatory Visit: Payer: Self-pay | Admitting: Family Medicine

## 2020-01-18 DIAGNOSIS — H401122 Primary open-angle glaucoma, left eye, moderate stage: Secondary | ICD-10-CM | POA: Diagnosis not present

## 2020-01-18 DIAGNOSIS — N189 Chronic kidney disease, unspecified: Secondary | ICD-10-CM | POA: Diagnosis not present

## 2020-01-18 DIAGNOSIS — N2581 Secondary hyperparathyroidism of renal origin: Secondary | ICD-10-CM | POA: Diagnosis not present

## 2020-01-18 DIAGNOSIS — N185 Chronic kidney disease, stage 5: Secondary | ICD-10-CM | POA: Diagnosis not present

## 2020-01-18 DIAGNOSIS — D631 Anemia in chronic kidney disease: Secondary | ICD-10-CM | POA: Diagnosis not present

## 2020-01-18 DIAGNOSIS — I129 Hypertensive chronic kidney disease with stage 1 through stage 4 chronic kidney disease, or unspecified chronic kidney disease: Secondary | ICD-10-CM | POA: Diagnosis not present

## 2020-02-02 DIAGNOSIS — M1712 Unilateral primary osteoarthritis, left knee: Secondary | ICD-10-CM | POA: Diagnosis not present

## 2020-02-28 ENCOUNTER — Ambulatory Visit (INDEPENDENT_AMBULATORY_CARE_PROVIDER_SITE_OTHER): Payer: Medicare PPO

## 2020-02-28 DIAGNOSIS — R55 Syncope and collapse: Secondary | ICD-10-CM

## 2020-02-28 LAB — CUP PACEART REMOTE DEVICE CHECK
Battery Remaining Longevity: 130 mo
Battery Remaining Percentage: 95.5 %
Battery Voltage: 3.02 V
Brady Statistic AP VP Percent: 1 %
Brady Statistic AP VS Percent: 6.6 %
Brady Statistic AS VP Percent: 6.8 %
Brady Statistic AS VS Percent: 86 %
Brady Statistic RA Percent Paced: 6.3 %
Brady Statistic RV Percent Paced: 7.1 %
Date Time Interrogation Session: 20211026060850
Implantable Lead Implant Date: 20181022
Implantable Lead Implant Date: 20181022
Implantable Lead Location: 753859
Implantable Lead Location: 753860
Implantable Pulse Generator Implant Date: 20181022
Lead Channel Impedance Value: 360 Ohm
Lead Channel Impedance Value: 590 Ohm
Lead Channel Pacing Threshold Amplitude: 0.5 V
Lead Channel Pacing Threshold Amplitude: 0.5 V
Lead Channel Pacing Threshold Pulse Width: 0.5 ms
Lead Channel Pacing Threshold Pulse Width: 0.5 ms
Lead Channel Sensing Intrinsic Amplitude: 12 mV
Lead Channel Sensing Intrinsic Amplitude: 3.1 mV
Lead Channel Setting Pacing Amplitude: 0.75 V
Lead Channel Setting Pacing Amplitude: 2 V
Lead Channel Setting Pacing Pulse Width: 0.5 ms
Lead Channel Setting Sensing Sensitivity: 2 mV
Pulse Gen Model: 2272
Pulse Gen Serial Number: 8958692

## 2020-03-02 DIAGNOSIS — H401131 Primary open-angle glaucoma, bilateral, mild stage: Secondary | ICD-10-CM | POA: Diagnosis not present

## 2020-03-05 NOTE — Progress Notes (Signed)
Remote pacemaker transmission.   

## 2020-03-15 ENCOUNTER — Ambulatory Visit (INDEPENDENT_AMBULATORY_CARE_PROVIDER_SITE_OTHER): Payer: Medicare PPO

## 2020-03-15 VITALS — Ht 61.0 in | Wt 106.0 lb

## 2020-03-15 DIAGNOSIS — Z0000 Encounter for general adult medical examination without abnormal findings: Secondary | ICD-10-CM

## 2020-03-15 NOTE — Patient Instructions (Signed)
Ms. Schuff , Thank you for taking time to complete your Medicare Wellness Visit. I appreciate your ongoing commitment to your health goals. Please review the following plan we discussed and let me know if I can assist you in the future.   Screening recommendations/referrals: Colonoscopy: No longer required Mammogram: Per our conversation, scheduled for this month. Bone Density: Declined. Recommended yearly ophthalmology/optometry visit for glaucoma screening and checkup Recommended yearly dental visit for hygiene and checkup  Vaccinations: Influenza vaccine: Up to date Pneumococcal vaccine: Completed vaccines Tdap vaccine: Discuss with pharmacy Shingles vaccine: Discuss with pharmacy   Covid-19:Completed vaccines  Advanced directives: Copy in chart  Conditions/risks identified: See problem list  Next appointment: Follow up in one year for your annual wellness visit 03/19/2021 @ 1:30pm   Preventive Care 65 Years and Older, Female Preventive care refers to lifestyle choices and visits with your health care provider that can promote health and wellness. What does preventive care include?  A yearly physical exam. This is also called an annual well check.  Dental exams once or twice a year.  Routine eye exams. Ask your health care provider how often you should have your eyes checked.  Personal lifestyle choices, including:  Daily care of your teeth and gums.  Regular physical activity.  Eating a healthy diet.  Avoiding tobacco and drug use.  Limiting alcohol use.  Practicing safe sex.  Taking low-dose aspirin every day.  Taking vitamin and mineral supplements as recommended by your health care provider. What happens during an annual well check? The services and screenings done by your health care provider during your annual well check will depend on your age, overall health, lifestyle risk factors, and family history of disease. Counseling  Your health care provider  may ask you questions about your:  Alcohol use.  Tobacco use.  Drug use.  Emotional well-being.  Home and relationship well-being.  Sexual activity.  Eating habits.  History of falls.  Memory and ability to understand (cognition).  Work and work Statistician.  Reproductive health. Screening  You may have the following tests or measurements:  Height, weight, and BMI.  Blood pressure.  Lipid and cholesterol levels. These may be checked every 5 years, or more frequently if you are over 11 years old.  Skin check.  Lung cancer screening. You may have this screening every year starting at age 59 if you have a 30-pack-year history of smoking and currently smoke or have quit within the past 15 years.  Fecal occult blood test (FOBT) of the stool. You may have this test every year starting at age 43.  Flexible sigmoidoscopy or colonoscopy. You may have a sigmoidoscopy every 5 years or a colonoscopy every 10 years starting at age 30.  Hepatitis C blood test.  Hepatitis B blood test.  Sexually transmitted disease (STD) testing.  Diabetes screening. This is done by checking your blood sugar (glucose) after you have not eaten for a while (fasting). You may have this done every 1-3 years.  Bone density scan. This is done to screen for osteoporosis. You may have this done starting at age 66.  Mammogram. This may be done every 1-2 years. Talk to your health care provider about how often you should have regular mammograms. Talk with your health care provider about your test results, treatment options, and if necessary, the need for more tests. Vaccines  Your health care provider may recommend certain vaccines, such as:  Influenza vaccine. This is recommended every year.  Tetanus, diphtheria,  and acellular pertussis (Tdap, Td) vaccine. You may need a Td booster every 10 years.  Zoster vaccine. You may need this after age 15.  Pneumococcal 13-valent conjugate (PCV13) vaccine.  One dose is recommended after age 52.  Pneumococcal polysaccharide (PPSV23) vaccine. One dose is recommended after age 41. Talk to your health care provider about which screenings and vaccines you need and how often you need them. This information is not intended to replace advice given to you by your health care provider. Make sure you discuss any questions you have with your health care provider. Document Released: 05/18/2015 Document Revised: 01/09/2016 Document Reviewed: 02/20/2015 Elsevier Interactive Patient Education  2017 [employer] Prevention in the Home Falls can cause injuries. They can happen to people of all ages. There are many things you can do to make your home safe and to help prevent falls. What can I do on the outside of my home?  Regularly fix the edges of walkways and driveways and fix any cracks.  Remove anything that might make you trip as you walk through a door, such as a raised step or threshold.  Trim any bushes or trees on the path to your home.  Use bright outdoor lighting.  Clear any walking paths of anything that might make someone trip, such as rocks or tools.  Regularly check to see if handrails are loose or broken. Make sure that both sides of any steps have handrails.  Any raised decks and porches should have guardrails on the edges.  Have any leaves, snow, or ice cleared regularly.  Use sand or salt on walking paths during winter.  Clean up any spills in your garage right away. This includes oil or grease spills. What can I do in the bathroom?  Use night lights.  Install grab bars by the toilet and in the tub and shower. Do not use towel bars as grab bars.  Use non-skid mats or decals in the tub or shower.  If you need to sit down in the shower, use a plastic, non-slip stool.  Keep the floor dry. Clean up any water that spills on the floor as soon as it happens.  Remove soap buildup in the tub or shower regularly.  Attach bath  mats securely with double-sided non-slip rug tape.  Do not have throw rugs and other things on the floor that can make you trip. What can I do in the bedroom?  Use night lights.  Make sure that you have a light by your bed that is easy to reach.  Do not use any sheets or blankets that are too big for your bed. They should not hang down onto the floor.  Have a firm chair that has side arms. You can use this for support while you get dressed.  Do not have throw rugs and other things on the floor that can make you trip. What can I do in the kitchen?  Clean up any spills right away.  Avoid walking on wet floors.  Keep items that you use a lot in easy-to-reach places.  If you need to reach something above you, use a strong step stool that has a grab bar.  Keep electrical cords out of the way.  Do not use floor polish or wax that makes floors slippery. If you must use wax, use non-skid floor wax.  Do not have throw rugs and other things on the floor that can make you trip. What can I do with my  stairs?  Do not leave any items on the stairs.  Make sure that there are handrails on both sides of the stairs and use them. Fix handrails that are broken or loose. Make sure that handrails are as long as the stairways.  Check any carpeting to make sure that it is firmly attached to the stairs. Fix any carpet that is loose or worn.  Avoid having throw rugs at the top or bottom of the stairs. If you do have throw rugs, attach them to the floor with carpet tape.  Make sure that you have a light switch at the top of the stairs and the bottom of the stairs. If you do not have them, ask someone to add them for you. What else can I do to help prevent falls?  Wear shoes that:  Do not have high heels.  Have rubber bottoms.  Are comfortable and fit you well.  Are closed at the toe. Do not wear sandals.  If you use a stepladder:  Make sure that it is fully opened. Do not climb a closed  stepladder.  Make sure that both sides of the stepladder are locked into place.  Ask someone to hold it for you, if possible.  Clearly mark and make sure that you can see:  Any grab bars or handrails.  First and last steps.  Where the edge of each step is.  Use tools that help you move around (mobility aids) if they are needed. These include:  Canes.  Walkers.  Scooters.  Crutches.  Turn on the lights when you go into a dark area. Replace any light bulbs as soon as they burn out.  Set up your furniture so you have a clear path. Avoid moving your furniture around.  If any of your floors are uneven, fix them.  If there are any pets around you, be aware of where they are.  Review your medicines with your doctor. Some medicines can make you feel dizzy. This can increase your chance of falling. Ask your doctor what other things that you can do to help prevent falls. This information is not intended to replace advice given to you by your health care provider. Make sure you discuss any questions you have with your health care provider. Document Released: 02/15/2009 Document Revised: 09/27/2015 Document Reviewed: 05/26/2014 Elsevier Interactive Patient Education  2017 [employer].

## 2020-03-15 NOTE — Progress Notes (Signed)
Subjective:   Katrina Yang is a 79 y.o. female who presents for Medicare Annual (Subsequent) preventive examination.  I connected with [place] today by telephone and verified that I am speaking with the correct person using two identifiers. Location patient: home Location provider: work Persons participating in the virtual visit: patient, Marine scientist.    I discussed the limitations, risks, security and privacy concerns of performing an evaluation and management service by telephone and the availability of in person appointments. I also discussed with the patient that there may be a patient responsible charge related to this service. The patient expressed understanding and verbally consented to this telephonic visit.    Interactive audio and video telecommunications were attempted between this provider and patient, however failed, due to patient having technical difficulties OR patient did not have access to video capability.  We continued and completed visit with audio only.  Some vital signs may be absent or patient reported.   Time Spent with patient on telephone encounter: 20 minutes   Review of Systems     Cardiac Risk Factors include: advanced age (>44men, >3 women);dyslipidemia;hypertension     Objective:    Today's Vitals   03/15/20 1330 03/15/20 1331  Weight: 106 lb (48.1 kg)   Height: 5\' 1"  (1.549 m)   PainSc:  8    Body mass index is 20.03 kg/m.  Advanced Directives 03/15/2020 03/09/2019 05/26/2018 04/13/2018 03/04/2018 10/21/2017 03/03/2017  Does Patient Have a Medical Advance Directive? Yes Yes No No No Yes -  Type of Paramedic of [place];Living will [place];Living will - - - [place];Living will Living will;Healthcare Power of Attorney  Does patient want to make changes to medical advance directive? - No - Patient declined - - - No - Patient declined -  Copy of [place] in Chart? Yes  - validated most recent copy scanned in chart (See row information) Yes - validated most recent copy scanned in chart (See row information) - - - No - copy requested Yes  Would patient like information on creating a medical advance directive? - - No - Patient declined No - Patient declined - - -    Current Medications (verified) Outpatient Encounter Medications as of 03/15/2020  Medication Sig  . amLODipine (NORVASC) 5 MG tablet TAKE 1 TABLET BY MOUTH TWICE A DAY  . calcitRIOL (ROCALTROL) 0.25 MCG capsule TAKE 1 CAPSULE BY MOUTH EVERY DAY  . clopidogrel (PLAVIX) 75 MG tablet Take 1 tablet (75 mg total) by mouth daily.  Marland Kitchen gabapentin (NEURONTIN) 100 MG capsule Take 100 mg by mouth daily.  Marland Kitchen levothyroxine (SYNTHROID) 100 MCG tablet Take 1 tablet (100 mcg total) by mouth daily.  . metoprolol tartrate (LOPRESSOR) 25 MG tablet TAKE 1 TABLET BY MOUTH TWICE A DAY  . rosuvastatin (CRESTOR) 10 MG tablet Take 0.5 tablets (5 mg total) by mouth daily.   Facility-Administered Encounter Medications as of 03/15/2020  Medication  . 0.9 %  sodium chloride infusion    Allergies (verified) Iodinated diagnostic agents, Latex, Codeine, Lovastatin, Metronidazole, and Sulfa antibiotics   History: Past Medical History:  Diagnosis Date  . Anemia   . Bifascicular block   . Carotid bruit    a.  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  . Cervical spondylosis without myelopathy 02/01/2015  . CKD (chronic kidney disease), stage IV ([place])   . Colonic polyp    hx of  . CVA (cerebral vascular accident) ([place])  hx of  . Degenerative joint disease   . Diverticulosis of colon (without mention of hemorrhage)   . History of hysterectomy   . Hypertension    a. Renal artery dopplers in 2012 with no evidence for significant renal artery stenosis.   . Occipital neuralgia of left side 08/30/2013  . Osteoporosis, unspecified   . Palpitations    a. Event monitor (8/14) with no significant abnormalities.   . Peripheral  neuropathic pain   . Personal history of unspecified urinary disorder   . Presence of permanent cardiac pacemaker   . Pulmonary nodule   . Pure hypercholesterolemia   . Secondary hyperparathyroidism (of renal origin)   . Unspecified hypothyroidism    Past Surgical History:  Procedure Laterality Date  . ABDOMINAL HYSTERECTOMY    . BACK SURGERY     diskectomy L-5. Multiple back surgeries  . BASCILIC VEIN TRANSPOSITION Right 12/29/2017   Procedure: FIRST STAGE BASILIC VEIN TRANSPOSITION RIGHT ARM;  Surgeon: Angelia Mould, MD;  Location: [facility];  Service: Vascular;  Laterality: Right;  . BASCILIC VEIN TRANSPOSITION Right 04/13/2018   Procedure: BASILIC VEIN TRANSPOSITION SECOND STAGE;  Surgeon: Angelia Mould, MD;  Location: [facility];  Service: Vascular;  Laterality: Right;  . MENISCECTOMY Left 11/14/15  . PACEMAKER IMPLANT N/A 02/23/2017   Procedure: PACEMAKER IMPLANT;  Surgeon: Evans Lance, MD;  Location: [facility];  Service: Cardiovascular;  Laterality: N/A;   Family History  Problem Relation Age of Onset  . Kidney cancer Father   . Heart attack Father   . Heart disease Father   . Colon cancer Mother   . Ovarian cancer Mother   . Heart disease Mother   . Heart failure Mother   . Stomach cancer Neg Hx    Social History   Socioeconomic History  . Marital status: Widowed    Spouse name: [name]  . Number of children: 1  . Years of education: 2  . Highest education level: Not on file  Occupational History  . Occupation: Retired    Fish farm manager: [employer]    Comment: Metallurgist  Tobacco Use  . Smoking status: Never Smoker  . Smokeless tobacco: Never Used  Vaping Use  . Vaping Use: Never used  Substance and Sexual Activity  . Alcohol use: No  . Drug use: No  . Sexual activity: Not on file  Other Topics Concern  . Not on file  Social History Narrative   Pt lives at home with her spouse.   Patient drinks 1 glass of tea every other day.     Patient is right handed.   Social Determinants of Health   Financial Resource Strain: Low Risk   . Difficulty of Paying Living Expenses: Not hard at all  Food Insecurity: No Food Insecurity  . Worried About Charity fundraiser in the Last Year: Never true  . Ran Out of Food in the Last Year: Never true  Transportation Needs: No Transportation Needs  . Lack of Transportation (Medical): No  . Lack of Transportation (Non-Medical): No  Physical Activity: Inactive  . Days of Exercise per Week: 0 days  . Minutes of Exercise per Session: 0 min  Stress: No Stress Concern Present  . Feeling of Stress : Not at all  Social Connections: Socially Isolated  . Frequency of Communication with Friends and Family: More than three times a week  . Frequency of Social Gatherings with Friends and Family: More than three times a  week  . Attends Religious Services: Never  . Active Member of Clubs or Organizations: No  . Attends Archivist Meetings: Never  . Marital Status: Widowed    Tobacco Counseling Counseling given: Not Answered   Clinical Intake:  Pre-visit preparation completed: Yes  Pain : 0-10 Pain Score: 8  Pain Type: Chronic pain Pain Location: Knee Pain Onset: More than a month ago Pain Frequency: Constant     Nutritional Status: BMI of 19-24  Normal Nutritional Risks: None Diabetes: No  How often do you need to have someone help you when you read instructions, pamphlets, or other written materials from your doctor or pharmacy?: 1 - Never What is the last grade level you completed in school?: BS degree  Diabetic?No  Interpreter Needed?: No  Information entered by :: Caroleen Hamman LPN   Activities of Daily Living In your present state of health, do you have any difficulty performing the following activities: 03/15/2020  Hearing? N  Vision? N  Difficulty concentrating or making decisions? N  Walking or climbing stairs? N  Dressing or bathing? N  Doing  errands, shopping? N  Preparing Food and eating ? N  Using the Toilet? N  In the past six months, have you accidently leaked urine? N  Do you have problems with loss of bowel control? N  Managing your Medications? N  Managing your Finances? N  Housekeeping or managing your Housekeeping? N  Some recent data might be hidden    Patient Care Team: Libby Maw, MD as PCP - General (Family Medicine) Sherren Mocha, MD as PCP - Cardiology (Cardiology) Deboraha Sprang, MD as PCP - Electrophysiology (Cardiology) Love, Alyson Locket, MD (Neurology) Larey Dresser, MD as Consulting Physician (Cardiology) Raina Mina, MD (Nephrology) Kathrynn Ducking, MD as Consulting Physician (Neurology) Dahlia Byes (Ophthalmology) Corliss Parish, MD as Consulting Physician (Nephrology)  Indicate any recent Medical Services you may have received from other than [facility] providers in the past year (date may be approximate).     Assessment:   This is a routine wellness examination for Katrina Yang.  Hearing/Vision screen  Hearing Screening   125Hz  250Hz  500Hz  1000Hz  2000Hz  3000Hz  4000Hz  6000Hz  8000Hz   Right ear:           Left ear:           Comments: No issues  Vision Screening Comments: Wears glasses Last eye exam-02/2020-Dr. Harris Has Glaucoma  Dietary issues and exercise activities discussed: Current Exercise Habits: The patient does not participate in regular exercise at present (pt states she walks around the house frequently), Exercise limited by: orthopedic condition(s) (knee pain)  Goals    . Patient Stated     Continue eating healthy      Depression Screen PHQ 2/9 Scores 03/15/2020 03/09/2019 11/01/2015  PHQ - 2 Score 0 0 0    Fall Risk Fall Risk  03/15/2020 09/19/2019 03/09/2019 11/01/2015  Falls in the past year? 0 0 0 Yes  Number falls in past yr: 0 - 0 1  Injury with Fall? 0 - 0 Yes  Follow up Falls prevention discussed - - -    Any stairs in or around the  home? Yes  If so, are there any without handrails? No  Home free of loose throw rugs in walkways, pet beds, electrical cords, etc? Yes  Adequate lighting in your home to reduce risk of falls? Yes   ASSISTIVE DEVICES UTILIZED TO PREVENT FALLS:  Life alert? No  Use  of a cane, walker or w/c? Yes  Grab bars in the bathroom? Yes  Shower chair or bench in shower? No  Elevated toilet seat or a handicapped toilet? No   TIMED UP AND GO:  Was the test performed? No phone visit.    Cognitive Function:No cognitive impairment noted     6CIT Screen 03/15/2020  What Year? 0 points  What month? 0 points  What time? 0 points  Count back from 20 0 points  Months in reverse 0 points  Repeat phrase 0 points  Total Score 0    Immunizations Immunization History  Administered Date(s) Administered  . Fluad Quad(high Dose 65+) 02/06/2019  . Influenza Split 01/29/2012  . Influenza Whole 02/02/2009, 02/02/2010  . Influenza, High Dose Seasonal PF 02/16/2017, 02/04/2018  . Influenza,inj,Quad PF,6+ Mos 01/19/2013, 01/16/2014, 02/09/2015, 01/04/2016  . Influenza-Unspecified 03/05/2017, 02/06/2019  . Moderna SARS-COVID-2 Vaccination 06/27/2019  . PFIZER SARS-COV-2 Vaccination 05/29/2018  . Pneumococcal Conjugate-13 01/16/2014  . Pneumococcal Polysaccharide-23 10/04/1999, 01/14/2012  . Td 10/24/2008  . Zoster 01/01/2009    TDAP status: Due, Education has been provided regarding the importance of this vaccine. Advised may receive this vaccine at local pharmacy or Health Dept. Aware to provide a copy of the vaccination record if obtained from local pharmacy or Health Dept. Verbalized acceptance and understanding.   Flu Vaccine status: Up to date   Pneumococcal vaccine status: Up to date   Covid-19 vaccine status: Completed vaccines  Qualifies for Shingles Vaccine? Yes   Zostavax completed Yes   Shingrix Completed?: No.    Education has been provided regarding the importance of this vaccine.  Patient has been advised to call insurance company to determine out of pocket expense if they have not yet received this vaccine. Advised may also receive vaccine at local pharmacy or Health Dept. Verbalized acceptance and understanding.  Screening Tests Health Maintenance  Topic Date Due  . Hepatitis C Screening  Never done  . TETANUS/TDAP  10/25/2018  . INFLUENZA VACCINE  12/04/2019  . COLONOSCOPY  08/26/2021  . DEXA SCAN  Completed  . COVID-19 Vaccine  Completed  . PNA vac Low Risk Adult  Completed    Health Maintenance  Health Maintenance Due  Topic Date Due  . Hepatitis C Screening  Never done  . TETANUS/TDAP  10/25/2018  . INFLUENZA VACCINE  12/04/2019    Colorectal cancer screening: No longer required.    Mammogram status:Patient states mammogram scheduled for this month.  Bone Density status: Declined  Lung Cancer Screening: (Low Dose CT Chest recommended if Age 63-80 years, 30 pack-year currently smoking OR have quit w/in 15years.) does not qualify.     Additional Screening:  Hepatitis C Screening: does not qualify  Vision Screening: Recommended annual ophthalmology exams for early detection of glaucoma and other disorders of the eye. Is the patient up to date with their annual eye exam?  Yes  Who is the provider or what is the name of the office in which the patient attends annual eye exams? Dr. Kenton Kingfisher   Dental Screening: Recommended annual dental exams for proper oral hygiene  Community Resource Referral / Chronic Care Management: CRR required this visit?  No   CCM required this visit?  No      Plan:     I have personally reviewed and noted the following in the patient's chart:   . Medical and social history . Use of alcohol, tobacco or illicit drugs  . Current medications and supplements . Functional ability  and status . Nutritional status . Physical activity . Advanced directives . List of other physicians . Hospitalizations, surgeries, and  ER visits in previous 12 months . Vitals . Screenings to include cognitive, depression, and falls . Referrals and appointments  In addition, I have reviewed and discussed with patient certain preventive protocols, quality metrics, and best practice recommendations. A written personalized care plan for preventive services as well as general preventive health recommendations were provided to patient.   Due to this being a telephonic visit, the after visit summary with patients personalized plan was offered to patient via mail or my-chart. Patient would like to access on my-chart.   Marta Antu, LPN   01/48/4039  Nurse Health Advisor  Nurse Notes: None

## 2020-03-17 ENCOUNTER — Other Ambulatory Visit: Payer: Self-pay | Admitting: Family Medicine

## 2020-03-17 DIAGNOSIS — M810 Age-related osteoporosis without current pathological fracture: Secondary | ICD-10-CM

## 2020-03-21 DIAGNOSIS — E785 Hyperlipidemia, unspecified: Secondary | ICD-10-CM | POA: Diagnosis not present

## 2020-03-21 DIAGNOSIS — D631 Anemia in chronic kidney disease: Secondary | ICD-10-CM | POA: Diagnosis not present

## 2020-03-21 DIAGNOSIS — N185 Chronic kidney disease, stage 5: Secondary | ICD-10-CM | POA: Diagnosis not present

## 2020-03-21 DIAGNOSIS — N2581 Secondary hyperparathyroidism of renal origin: Secondary | ICD-10-CM | POA: Diagnosis not present

## 2020-03-21 DIAGNOSIS — N189 Chronic kidney disease, unspecified: Secondary | ICD-10-CM | POA: Diagnosis not present

## 2020-03-21 DIAGNOSIS — I120 Hypertensive chronic kidney disease with stage 5 chronic kidney disease or end stage renal disease: Secondary | ICD-10-CM | POA: Diagnosis not present

## 2020-03-21 DIAGNOSIS — N281 Cyst of kidney, acquired: Secondary | ICD-10-CM | POA: Diagnosis not present

## 2020-03-26 DIAGNOSIS — Z01419 Encounter for gynecological examination (general) (routine) without abnormal findings: Secondary | ICD-10-CM | POA: Diagnosis not present

## 2020-04-03 DIAGNOSIS — M1712 Unilateral primary osteoarthritis, left knee: Secondary | ICD-10-CM | POA: Diagnosis not present

## 2020-04-11 ENCOUNTER — Other Ambulatory Visit: Payer: Self-pay | Admitting: Family Medicine

## 2020-04-11 NOTE — Telephone Encounter (Signed)
Last OV 09/19/19 Last fill 01/16/20  #180/0

## 2020-04-23 ENCOUNTER — Other Ambulatory Visit: Payer: Self-pay | Admitting: Family Medicine

## 2020-04-23 DIAGNOSIS — I779 Disorder of arteries and arterioles, unspecified: Secondary | ICD-10-CM

## 2020-04-23 NOTE — Telephone Encounter (Signed)
Last OV 09/19/19 Last fill 10/24/19 #45/1

## 2020-05-04 DIAGNOSIS — Z1231 Encounter for screening mammogram for malignant neoplasm of breast: Secondary | ICD-10-CM | POA: Diagnosis not present

## 2020-05-04 LAB — HM MAMMOGRAPHY

## 2020-05-10 ENCOUNTER — Encounter: Payer: Self-pay | Admitting: Family Medicine

## 2020-05-29 ENCOUNTER — Ambulatory Visit (INDEPENDENT_AMBULATORY_CARE_PROVIDER_SITE_OTHER): Payer: Medicare PPO

## 2020-05-29 DIAGNOSIS — I639 Cerebral infarction, unspecified: Secondary | ICD-10-CM | POA: Diagnosis not present

## 2020-05-29 LAB — CUP PACEART REMOTE DEVICE CHECK
Battery Remaining Longevity: 130 mo
Battery Remaining Percentage: 95.5 %
Battery Voltage: 3.02 V
Brady Statistic AP VP Percent: 1 %
Brady Statistic AP VS Percent: 6.7 %
Brady Statistic AS VP Percent: 7.1 %
Brady Statistic AS VS Percent: 86 %
Brady Statistic RA Percent Paced: 6.3 %
Brady Statistic RV Percent Paced: 7.4 %
Date Time Interrogation Session: 20220125020013
Implantable Lead Implant Date: 20181022
Implantable Lead Implant Date: 20181022
Implantable Lead Location: 753859
Implantable Lead Location: 753860
Implantable Pulse Generator Implant Date: 20181022
Lead Channel Impedance Value: 350 Ohm
Lead Channel Impedance Value: 550 Ohm
Lead Channel Pacing Threshold Amplitude: 0.5 V
Lead Channel Pacing Threshold Amplitude: 0.5 V
Lead Channel Pacing Threshold Pulse Width: 0.5 ms
Lead Channel Pacing Threshold Pulse Width: 0.5 ms
Lead Channel Sensing Intrinsic Amplitude: 12 mV
Lead Channel Sensing Intrinsic Amplitude: 3.6 mV
Lead Channel Setting Pacing Amplitude: 0.75 V
Lead Channel Setting Pacing Amplitude: 2 V
Lead Channel Setting Pacing Pulse Width: 0.5 ms
Lead Channel Setting Sensing Sensitivity: 2 mV
Pulse Gen Model: 2272
Pulse Gen Serial Number: 8958692

## 2020-06-01 DIAGNOSIS — H401122 Primary open-angle glaucoma, left eye, moderate stage: Secondary | ICD-10-CM | POA: Diagnosis not present

## 2020-06-04 ENCOUNTER — Telehealth: Payer: Self-pay | Admitting: Gastroenterology

## 2020-06-04 MED ORDER — AMOXICILLIN-POT CLAVULANATE 500-125 MG PO TABS: 1.0000 | ORAL_TABLET | ORAL | 0 refills | Status: AC

## 2020-06-04 NOTE — Addendum Note (Signed)
Addended by: Yevette Edwards on: 06/04/2020 01:17 PM   Modules accepted: Orders

## 2020-06-04 NOTE — Telephone Encounter (Signed)
Spoke with patient she thinks she may be having a diverticulitis flare, she states that "my stomach is killing me", she reports that she has been eating vegetable soup off and on this past weekend, she states that it had corn in it, reports LLQ pain that woke her up in the middle of the night, pt states that the pain comes and goes, she states that "it just about takes my breath away", denies any fever or any other symptoms. 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 Please advise, thanks.

## 2020-06-04 NOTE — Telephone Encounter (Signed)
Spoke with patient in regards to Dr. Doyne Keel recommendations, pt would like to proceed with Augmentin, pt would like to hold off on Bentyl at this time. Patient is aware that Augmentin may cause diarrhea. Patient is aware that she will need to be on a liquid diet for a day or two, then she can advance to a bland/low residue diet. Advised patient to give [place] a call if she does not notice any improvement of symptoms, develops a fever or any worsening abdominal pain. Patient verbalized understanding of all information and had no concerns at the end of the call.   Prescription sent to pharmacy on file- pt confirmed.

## 2020-06-04 NOTE — Telephone Encounter (Signed)
Sorry to hear this, sounds like she is having a diverticulitis flare.  I think we should offer her empiric therapy based on her symptoms.  She had a lot of nausea with Flagyl which is common, so if she wants to avoid Flagyl based therapy, we can give her Augmentin.  The dose of Augmentin would be 500 mg every 24 hours given her renal failure, for 7 days, please counsel her that this can cause diarrhea. We can also give her some Bentyl 10 mg every 8 hours to use as needed.  Alternatively if he is not having fevers and she wants to monitor this and avoid antibiotics that is also okay.  She should go on a liquid diet for a day or 2 until she is feeling better as well, and then a very bland or low residual diet thereafter.  If no improvement, fevers, worsening abdominal pain, she needs to let us know.  Thanks

## 2020-06-06 DIAGNOSIS — M1712 Unilateral primary osteoarthritis, left knee: Secondary | ICD-10-CM | POA: Diagnosis not present

## 2020-06-09 NOTE — Progress Notes (Signed)
Remote pacemaker transmission.   

## 2020-06-20 DIAGNOSIS — I120 Hypertensive chronic kidney disease with stage 5 chronic kidney disease or end stage renal disease: Secondary | ICD-10-CM | POA: Diagnosis not present

## 2020-06-20 DIAGNOSIS — N2581 Secondary hyperparathyroidism of renal origin: Secondary | ICD-10-CM | POA: Diagnosis not present

## 2020-06-20 DIAGNOSIS — D631 Anemia in chronic kidney disease: Secondary | ICD-10-CM | POA: Diagnosis not present

## 2020-06-20 DIAGNOSIS — N185 Chronic kidney disease, stage 5: Secondary | ICD-10-CM | POA: Diagnosis not present

## 2020-06-20 DIAGNOSIS — N189 Chronic kidney disease, unspecified: Secondary | ICD-10-CM | POA: Diagnosis not present

## 2020-06-22 DIAGNOSIS — H401111 Primary open-angle glaucoma, right eye, mild stage: Secondary | ICD-10-CM | POA: Diagnosis not present

## 2020-07-05 DIAGNOSIS — H401131 Primary open-angle glaucoma, bilateral, mild stage: Secondary | ICD-10-CM | POA: Diagnosis not present

## 2020-07-09 ENCOUNTER — Other Ambulatory Visit: Payer: Self-pay | Admitting: Family Medicine

## 2020-07-17 DIAGNOSIS — S41111A Laceration without foreign body of right upper arm, initial encounter: Secondary | ICD-10-CM | POA: Diagnosis not present

## 2020-07-20 ENCOUNTER — Ambulatory Visit: Payer: Medicare PPO | Admitting: Cardiovascular Disease

## 2020-07-27 ENCOUNTER — Other Ambulatory Visit: Payer: Self-pay

## 2020-07-27 ENCOUNTER — Encounter: Payer: Self-pay | Admitting: Cardiovascular Disease

## 2020-07-27 ENCOUNTER — Ambulatory Visit: Payer: Medicare PPO | Admitting: Cardiovascular Disease

## 2020-07-27 VITALS — BP 130/60 | HR 66 | Ht 63.0 in | Wt 106.2 lb

## 2020-07-27 DIAGNOSIS — I10 Essential (primary) hypertension: Secondary | ICD-10-CM | POA: Diagnosis not present

## 2020-07-27 DIAGNOSIS — E782 Mixed hyperlipidemia: Secondary | ICD-10-CM

## 2020-07-27 DIAGNOSIS — I639 Cerebral infarction, unspecified: Secondary | ICD-10-CM

## 2020-07-27 DIAGNOSIS — I358 Other nonrheumatic aortic valve disorders: Secondary | ICD-10-CM | POA: Diagnosis not present

## 2020-07-27 DIAGNOSIS — R0989 Other specified symptoms and signs involving the circulatory and respiratory systems: Secondary | ICD-10-CM

## 2020-07-27 NOTE — Progress Notes (Signed)
Cardiology Office Note:    Date:  07/27/2020   ID:  Katrina Yang, DOB [birth year], MRN [id number]  PCP:  Libby Maw, [facility]  Cardiologist:  Sherren Mocha, MD  Advanced Practice Provider:  No care team member to display Electrophysiologist:  Virl Axe, MD       Referring MD: Libby Maw,*   Chief Complaint  Patient presents with  . Hypertension    History of Present Illness:    Katrina Yang is a 80 y.o. female with a hx of:  Bifascicular block and syncope; s/p Pacemaker  Aortic sclerosis  Hx of CVA  Hypertension   Hyperlipidemia   Chronic kidney disease stage V  Carotid stenosis  Hypothyroidism   The patient is here alone today. She has been widowed now for about 15 months. She is getting along fairly well and doesn't report any concerns. She is very limited by knee pain and weakness. States that her knee is 'bone on bone' but she is not considering surgery because of her comorbid medical conditions. She has stage 5 chronic kidney disease and had an AV fistula placed about 3 years ago. She still makes urine and hasn't had any problems with leg swelling or edema.   She denies any problems with chest pain or pressure.  No lightheadedness or presyncope.  No heart palpitations.  She has occasional shortness of breath but this is rare and unchanged over time.  Past Medical History:  Diagnosis Date  . Anemia   . Bifascicular block   . Carotid bruit    a.  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  . Cervical spondylosis without myelopathy 02/01/2015  . CKD (chronic kidney disease), stage IV ([place])   . Colonic polyp    hx of  . CVA (cerebral vascular accident) ([place])    hx of  . Degenerative joint disease   . Diverticulosis of colon (without mention of hemorrhage)   . History of hysterectomy   . Hypertension    a. Renal artery dopplers in 2012 with no evidence for significant renal artery stenosis.   .  Occipital neuralgia of left side 08/30/2013  . Osteoporosis, unspecified   . Palpitations    a. Event monitor (8/14) with no significant abnormalities.   . Peripheral neuropathic pain   . Personal history of unspecified urinary disorder   . Presence of permanent cardiac pacemaker   . Pulmonary nodule   . Pure hypercholesterolemia   . Secondary hyperparathyroidism (of renal origin)   . Unspecified hypothyroidism     Past Surgical History:  Procedure Laterality Date  . ABDOMINAL HYSTERECTOMY    . BACK SURGERY     diskectomy L-5. Multiple back surgeries  . BASCILIC VEIN TRANSPOSITION Right 12/29/2017   Procedure: FIRST STAGE BASILIC VEIN TRANSPOSITION RIGHT ARM;  Surgeon: Angelia Mould, MD;  Location: [place] [facility];  Service: Vascular;  Laterality: Right;  . BASCILIC VEIN TRANSPOSITION Right 04/13/2018   Procedure: BASILIC VEIN TRANSPOSITION SECOND STAGE;  Surgeon: Angelia Mould, MD;  Location: [place];  Service: Vascular;  Laterality: Right;  . MENISCECTOMY Left 11/14/15  . PACEMAKER IMPLANT N/A 02/23/2017   Procedure: PACEMAKER IMPLANT;  Surgeon: Evans Lance, MD;  Location: [place] [facility] [facility];  Service: Cardiovascular;  Laterality: N/A;    Current Medications: Current Meds  Medication Sig  . amLODipine (NORVASC) 5 MG tablet TAKE 1 TABLET BY MOUTH TWICE A DAY  . calcitRIOL (ROCALTROL) 0.25 MCG capsule TAKE  1 CAPSULE BY MOUTH EVERY DAY  . clopidogrel (PLAVIX) 75 MG tablet Take 1 tablet (75 mg total) by mouth daily.  Marland Kitchen gabapentin (NEURONTIN) 100 MG capsule Take 100 mg by mouth daily.  Marland Kitchen levothyroxine (SYNTHROID) 100 MCG tablet Take 1 tablet (100 mcg total) by mouth daily.  Marland Kitchen lisinopril (ZESTRIL) 2.5 MG tablet Take 1 tablet by mouth daily.  . metoprolol tartrate (LOPRESSOR) 25 MG tablet TAKE 1 TABLET BY MOUTH TWICE A DAY  . predniSONE (DELTASONE) 5 MG tablet prednisone 5 mg tablet  . rosuvastatin (CRESTOR) 10 MG tablet TAKE 1/2 TABLET BY MOUTH DAILY   Current  Facility-Administered Medications for the 07/27/20 encounter (Office Visit) with Sherren Mocha, MD  Medication  . 0.9 %  sodium chloride infusion     Allergies:   Iodinated diagnostic agents, Latex, Codeine, Lovastatin, Metronidazole, and Sulfa antibiotics   Social History   Socioeconomic History  . Marital status: Widowed    Spouse name: [name]  . Number of children: 1  . Years of education: 2  . Highest education level: Not on file  Occupational History  . Occupation: Retired    Fish farm manager: [employer]    Comment: Metallurgist  Tobacco Use  . Smoking status: Never Smoker  . Smokeless tobacco: Never Used  Vaping Use  . Vaping Use: Never used  Substance and Sexual Activity  . Alcohol use: No  . Drug use: No  . Sexual activity: Not on file  Other Topics Concern  . Not on file  Social History Narrative   Pt lives at home with her spouse.   Patient drinks 1 glass of tea every other day.   Patient is right handed.   Social Determinants of Health   Financial Resource Strain: Low Risk   . Difficulty of Paying Living Expenses: Not hard at all  Food Insecurity: No Food Insecurity  . Worried About Charity fundraiser in the Last Year: Never true  . Ran Out of Food in the Last Year: Never true  Transportation Needs: No Transportation Needs  . Lack of Transportation (Medical): No  . Lack of Transportation (Non-Medical): No  Physical Activity: Inactive  . Days of Exercise per Week: 0 days  . Minutes of Exercise per Session: 0 min  Stress: No Stress Concern Present  . Feeling of Stress : Not at all  Social Connections: Socially Isolated  . Frequency of Communication with Friends and Family: More than three times a week  . Frequency of Social Gatherings with Friends and Family: More than three times a week  . Attends Religious Services: Never  . Active Member of Clubs or Organizations: No  . Attends Archivist Meetings: Never  . Marital Status: Widowed      Family History: The patient's family history includes Colon cancer in her mother; Heart attack in her father; Heart disease in her father and mother; Heart failure in her mother; Kidney cancer in her father; Ovarian cancer in her mother. There is no history of Stomach cancer.  ROS:   Please see the history of present illness.    All other systems reviewed and are negative.  EKGs/Labs/Other Studies Reviewed:    The following studies were reviewed today: Echo 07/14/19: 1. Left ventricular ejection fraction, by estimation, is 55 to 60%. The  left ventricle has normal function. The left ventricle has no regional  wall motion abnormalities. Left ventricular diastolic parameters are  consistent with Grade I diastolic  dysfunction (impaired relaxation).  2. Right ventricular systolic function is normal. The right ventricular  size is normal. There is normal pulmonary artery systolic pressure. The  estimated right ventricular systolic pressure is 16.1 mmHg.  3. Left atrial size was moderately dilated.  4. The mitral valve is normal in structure. No evidence of mitral valve  regurgitation. No evidence of mitral stenosis.  5. The aortic valve is abnormal. Aortic valve regurgitation is not  visualized. Mild to moderate aortic valve sclerosis/calcification is  present, without any evidence of aortic stenosis.  6. The inferior vena cava is normal in size with greater than 50%  respiratory variability, suggesting right atrial pressure of 3 mmHg.  EKG:  EKG is not ordered today.    Recent Labs: 09/19/2019: ALT 7; BUN 46; Creatinine, Ser 6.63; Hemoglobin 9.3; Platelets 249.0; Potassium 5.9 No hemolysis seen; Sodium 140; TSH 5.50  Recent Lipid Panel    Component Value Date/Time   CHOL 183 09/19/2019 0916   TRIG 82.0 09/19/2019 0916   HDL 79.40 09/19/2019 0916   CHOLHDL 2 09/19/2019 0916   VLDL 16.4 09/19/2019 0916   LDLCALC 88 09/19/2019 0916   LDLDIRECT 107.3 02/14/2013 0843      Risk Assessment/Calculations:       Physical Exam:    VS:  BP 130/60   Pulse 66   Ht 5\' 3"  (1.6 m)   Wt 106 lb 3.2 oz (48.2 kg)   SpO2 97%   BMI 18.81 kg/m     Wt Readings from Last 3 Encounters:  07/27/20 106 lb 3.2 oz (48.2 kg)  03/15/20 106 lb (48.1 kg)  01/10/20 106 lb (48.1 kg)     GEN: Well nourished, well developed in no acute distress HEENT: Normal NECK: No JVD; BL carotid bruits L>R LYMPHATICS: No lymphadenopathy CARDIAC: RRR, 2/6 systolic murmur at the right upper sternal border, early peaking, A2 present. RESPIRATORY:  Clear to auscultation without rales, wheezing or rhonchi  ABDOMEN: Soft, non-tender, non-distended MUSCULOSKELETAL:  No edema; No deformity  SKIN: Warm and dry NEUROLOGIC:  Alert and oriented x 3 PSYCHIATRIC:  Normal affect   ASSESSMENT:    1. Bilateral carotid bruits   2. Aortic valve sclerosis   3. Mixed hyperlipidemia   4. Essential hypertension   5. Cerebrovascular accident (CVA), unspecified mechanism ([place])    PLAN:    In order of problems listed above:  1. Recommend a carotid ultrasound to further evaluate.  Patient treated with clopidogrel and a statin drug. 2. Recommend a repeat echocardiogram when she returns next year.  I reviewed her echo from last year and this demonstrated aortic valve thickening and calcification without significant stenosis.  Her exam suggest at least mild aortic stenosis. 3. Lipids reviewed with cholesterol 183, HDL 79, LDL 88.  Treated with low-dose rosuvastatin. 4. Blood pressure well controlled on amlodipine, metoprolol, and lisinopril. 5. Patient treated with clopidogrel long-term, no active bleeding problems.  Overall the patient is doing remarkably well considering her age and advanced kidney disease.  She has a functioning AV fistula but has not required hemodialysis and she is followed closely at [facility] with surveillance labs about every 3 months.  I will see her back in 1 year for  follow-up as long as no problems arise in the interim.  She is going to have a carotid duplex scan to evaluate bilateral carotid bruits and then will have an echocardiogram done in 1 year before her next office visit.        Medication Adjustments/Labs and Tests  Ordered: Current medicines are reviewed at length with the patient today.  Concerns regarding medicines are outlined above.  Orders Placed This Encounter  Procedures  . ECHOCARDIOGRAM COMPLETE  . VAS US CAROTID   No orders of the defined types were placed in this encounter.   Patient Instructions  Medication Instructions:  Your provider recommends that you continue on your current medications as directed. Please refer to the Current Medication list given to you today.   *If you need a refill on your cardiac medications before your next appointment, please call your pharmacy*  Testing/Procedures: Your physician has requested that you have a carotid duplex. This test is an ultrasound of the carotid arteries in your neck. It looks at blood flow through these arteries that supply the brain with blood. Allow one hour for this exam. There are no restrictions or special instructions.  Follow-Up: You will be called to arrange your 1 year echo and office visit.    Signed, Sherren Mocha, MD  07/27/2020 12:05 PM    [place]

## 2020-07-27 NOTE — Patient Instructions (Signed)
Medication Instructions:  Your provider recommends that you continue on your current medications as directed. Please refer to the Current Medication list given to you today.   *If you need a refill on your cardiac medications before your next appointment, please call your pharmacy*  Testing/Procedures: Your physician has requested that you have a carotid duplex. This test is an ultrasound of the carotid arteries in your neck. It looks at blood flow through these arteries that supply the brain with blood. Allow one hour for this exam. There are no restrictions or special instructions.  Follow-Up: You will be called to arrange your 1 year echo and office visit.

## 2020-07-31 ENCOUNTER — Other Ambulatory Visit: Payer: Self-pay

## 2020-07-31 ENCOUNTER — Ambulatory Visit (HOSPITAL_COMMUNITY)
Admission: RE | Admit: 2020-07-31 | Discharge: 2020-07-31 | Disposition: A | Discharge status: Home / Self Care | Payer: Medicare PPO | Source: Ambulatory Visit | Attending: Internal Medicine | Admitting: Internal Medicine

## 2020-07-31 DIAGNOSIS — R0989 Other specified symptoms and signs involving the circulatory and respiratory systems: Secondary | ICD-10-CM

## 2020-08-13 DIAGNOSIS — M25562 Pain in left knee: Secondary | ICD-10-CM | POA: Diagnosis not present

## 2020-08-28 ENCOUNTER — Ambulatory Visit (INDEPENDENT_AMBULATORY_CARE_PROVIDER_SITE_OTHER): Payer: Medicare PPO

## 2020-08-28 DIAGNOSIS — I639 Cerebral infarction, unspecified: Secondary | ICD-10-CM

## 2020-08-28 LAB — CUP PACEART REMOTE DEVICE CHECK
Battery Remaining Longevity: 130 mo
Battery Remaining Percentage: 95.5 %
Battery Voltage: 3.02 V
Brady Statistic AP VP Percent: 1 %
Brady Statistic AP VS Percent: 6.7 %
Brady Statistic AS VP Percent: 6.8 %
Brady Statistic AS VS Percent: 86 %
Brady Statistic RA Percent Paced: 6.3 %
Brady Statistic RV Percent Paced: 7.1 %
Date Time Interrogation Session: 20220426053715
Implantable Lead Implant Date: 20181022
Implantable Lead Implant Date: 20181022
Implantable Lead Location: 753859
Implantable Lead Location: 753860
Implantable Pulse Generator Implant Date: 20181022
Lead Channel Impedance Value: 360 Ohm
Lead Channel Impedance Value: 550 Ohm
Lead Channel Pacing Threshold Amplitude: 0.5 V
Lead Channel Pacing Threshold Amplitude: 0.5 V
Lead Channel Pacing Threshold Pulse Width: 0.5 ms
Lead Channel Pacing Threshold Pulse Width: 0.5 ms
Lead Channel Sensing Intrinsic Amplitude: 11.5 mV
Lead Channel Sensing Intrinsic Amplitude: 4.1 mV
Lead Channel Setting Pacing Amplitude: 0.75 V
Lead Channel Setting Pacing Amplitude: 2 V
Lead Channel Setting Pacing Pulse Width: 0.5 ms
Lead Channel Setting Sensing Sensitivity: 2 mV
Pulse Gen Model: 2272
Pulse Gen Serial Number: 8958692

## 2020-08-31 DIAGNOSIS — H401131 Primary open-angle glaucoma, bilateral, mild stage: Secondary | ICD-10-CM | POA: Diagnosis not present

## 2020-09-09 ENCOUNTER — Other Ambulatory Visit: Payer: Self-pay | Admitting: Family Medicine

## 2020-09-09 DIAGNOSIS — E039 Hypothyroidism, unspecified: Secondary | ICD-10-CM

## 2020-09-13 ENCOUNTER — Ambulatory Visit: Payer: Medicare PPO | Admitting: Neurology

## 2020-09-17 DIAGNOSIS — N2581 Secondary hyperparathyroidism of renal origin: Secondary | ICD-10-CM | POA: Diagnosis not present

## 2020-09-17 DIAGNOSIS — E785 Hyperlipidemia, unspecified: Secondary | ICD-10-CM | POA: Diagnosis not present

## 2020-09-17 DIAGNOSIS — D631 Anemia in chronic kidney disease: Secondary | ICD-10-CM | POA: Diagnosis not present

## 2020-09-17 DIAGNOSIS — N185 Chronic kidney disease, stage 5: Secondary | ICD-10-CM | POA: Diagnosis not present

## 2020-09-17 DIAGNOSIS — I120 Hypertensive chronic kidney disease with stage 5 chronic kidney disease or end stage renal disease: Secondary | ICD-10-CM | POA: Diagnosis not present

## 2020-09-17 DIAGNOSIS — N189 Chronic kidney disease, unspecified: Secondary | ICD-10-CM | POA: Diagnosis not present

## 2020-09-17 DIAGNOSIS — N390 Urinary tract infection, site not specified: Secondary | ICD-10-CM | POA: Diagnosis not present

## 2020-09-17 DIAGNOSIS — N281 Cyst of kidney, acquired: Secondary | ICD-10-CM | POA: Diagnosis not present

## 2020-09-18 NOTE — Progress Notes (Signed)
Remote pacemaker transmission.   

## 2020-09-21 DIAGNOSIS — N185 Chronic kidney disease, stage 5: Secondary | ICD-10-CM | POA: Diagnosis not present

## 2020-09-30 ENCOUNTER — Other Ambulatory Visit: Payer: Self-pay | Admitting: Family

## 2020-09-30 DIAGNOSIS — M810 Age-related osteoporosis without current pathological fracture: Secondary | ICD-10-CM

## 2020-10-03 ENCOUNTER — Encounter (HOSPITAL_COMMUNITY): Payer: Medicare PPO

## 2020-10-03 NOTE — Discharge Instructions (Signed)

## 2020-10-04 ENCOUNTER — Encounter (HOSPITAL_COMMUNITY)
Admission: RE | Admit: 2020-10-04 | Discharge: 2020-10-04 | Disposition: A | Discharge status: Home / Self Care | Payer: Medicare PPO | Source: Ambulatory Visit | Attending: Nephrology | Admitting: Nephrology

## 2020-10-04 ENCOUNTER — Other Ambulatory Visit: Payer: Self-pay

## 2020-10-04 VITALS — BP 116/60 | HR 71 | Temp 98.3°F | Resp 16

## 2020-10-04 DIAGNOSIS — N185 Chronic kidney disease, stage 5: Secondary | ICD-10-CM

## 2020-10-04 LAB — POCT HEMOGLOBIN-HEMACUE: Hemoglobin: 7.8 g/dL — ABNORMAL LOW (ref 12.0–15.0)

## 2020-10-04 MED ORDER — EPOETIN ALFA-EPBX 10000 UNIT/ML IJ SOLN
INTRAMUSCULAR | Status: AC
  Administered 2020-10-04: 20000 [IU] via SUBCUTANEOUS
  Filled 2020-10-04: qty 2

## 2020-10-04 MED ORDER — EPOETIN ALFA-EPBX 10000 UNIT/ML IJ SOLN: 20000.0000 [IU] | INTRAMUSCULAR | Status: DC

## 2020-10-05 ENCOUNTER — Other Ambulatory Visit: Payer: Self-pay | Admitting: Family Medicine

## 2020-10-05 NOTE — Telephone Encounter (Signed)
Pt aware.

## 2020-10-05 NOTE — Telephone Encounter (Signed)
Pt is almost out and needs sent in today

## 2020-10-15 DIAGNOSIS — M542 Cervicalgia: Secondary | ICD-10-CM | POA: Diagnosis not present

## 2020-10-15 DIAGNOSIS — M1712 Unilateral primary osteoarthritis, left knee: Secondary | ICD-10-CM | POA: Diagnosis not present

## 2020-10-16 ENCOUNTER — Other Ambulatory Visit: Payer: Self-pay | Admitting: Orthopedic Surgery

## 2020-10-16 DIAGNOSIS — M542 Cervicalgia: Secondary | ICD-10-CM

## 2020-10-18 NOTE — Telephone Encounter (Signed)
Scheduled for appt 10/22/20. Dm/cma

## 2020-10-20 ENCOUNTER — Other Ambulatory Visit: Payer: Self-pay | Admitting: Family Medicine

## 2020-10-20 DIAGNOSIS — I779 Disorder of arteries and arterioles, unspecified: Secondary | ICD-10-CM

## 2020-10-22 ENCOUNTER — Telehealth: Payer: Medicare PPO | Admitting: Family Medicine

## 2020-10-22 DIAGNOSIS — M542 Cervicalgia: Secondary | ICD-10-CM | POA: Diagnosis not present

## 2020-10-27 ENCOUNTER — Other Ambulatory Visit: Payer: Self-pay

## 2020-10-27 ENCOUNTER — Ambulatory Visit
Admission: RE | Admit: 2020-10-27 | Discharge: 2020-10-27 | Disposition: A | Discharge status: Home / Self Care | Payer: Medicare PPO | Source: Ambulatory Visit | Attending: Orthopedic Surgery | Admitting: Orthopedic Surgery

## 2020-10-27 DIAGNOSIS — M4312 Spondylolisthesis, cervical region: Secondary | ICD-10-CM | POA: Diagnosis not present

## 2020-10-27 DIAGNOSIS — I6529 Occlusion and stenosis of unspecified carotid artery: Secondary | ICD-10-CM | POA: Diagnosis not present

## 2020-10-27 DIAGNOSIS — M47812 Spondylosis without myelopathy or radiculopathy, cervical region: Secondary | ICD-10-CM | POA: Diagnosis not present

## 2020-10-27 DIAGNOSIS — M4802 Spinal stenosis, cervical region: Secondary | ICD-10-CM | POA: Diagnosis not present

## 2020-10-27 DIAGNOSIS — M542 Cervicalgia: Secondary | ICD-10-CM

## 2020-10-31 ENCOUNTER — Emergency Department (HOSPITAL_COMMUNITY): Payer: Medicare PPO

## 2020-10-31 ENCOUNTER — Other Ambulatory Visit: Payer: Self-pay

## 2020-10-31 ENCOUNTER — Emergency Department (HOSPITAL_COMMUNITY)
Admission: EM | Admit: 2020-10-31 | Discharge: 2020-10-31 | Disposition: A | Discharge status: Home / Self Care | Payer: Medicare PPO | Attending: Emergency Medicine | Admitting: Emergency Medicine

## 2020-10-31 ENCOUNTER — Encounter (HOSPITAL_COMMUNITY): Payer: Self-pay

## 2020-10-31 DIAGNOSIS — J439 Emphysema, unspecified: Secondary | ICD-10-CM | POA: Diagnosis not present

## 2020-10-31 DIAGNOSIS — I2510 Atherosclerotic heart disease of native coronary artery without angina pectoris: Secondary | ICD-10-CM | POA: Insufficient documentation

## 2020-10-31 DIAGNOSIS — Z79899 Other long term (current) drug therapy: Secondary | ICD-10-CM | POA: Insufficient documentation

## 2020-10-31 DIAGNOSIS — N185 Chronic kidney disease, stage 5: Secondary | ICD-10-CM | POA: Diagnosis not present

## 2020-10-31 DIAGNOSIS — Z91040 Latex allergy status: Secondary | ICD-10-CM | POA: Diagnosis not present

## 2020-10-31 DIAGNOSIS — J90 Pleural effusion, not elsewhere classified: Secondary | ICD-10-CM | POA: Diagnosis not present

## 2020-10-31 DIAGNOSIS — M542 Cervicalgia: Secondary | ICD-10-CM | POA: Diagnosis not present

## 2020-10-31 DIAGNOSIS — E039 Hypothyroidism, unspecified: Secondary | ICD-10-CM | POA: Diagnosis not present

## 2020-10-31 DIAGNOSIS — Z950 Presence of cardiac pacemaker: Secondary | ICD-10-CM | POA: Diagnosis not present

## 2020-10-31 DIAGNOSIS — I10 Essential (primary) hypertension: Secondary | ICD-10-CM | POA: Diagnosis not present

## 2020-10-31 DIAGNOSIS — I517 Cardiomegaly: Secondary | ICD-10-CM | POA: Diagnosis not present

## 2020-10-31 DIAGNOSIS — I120 Hypertensive chronic kidney disease with stage 5 chronic kidney disease or end stage renal disease: Secondary | ICD-10-CM | POA: Diagnosis not present

## 2020-10-31 LAB — CBC WITH DIFFERENTIAL/PLATELET
Abs Immature Granulocytes: 0.1 10*3/uL — ABNORMAL HIGH (ref 0.00–0.07)
Basophils Absolute: 0 10*3/uL (ref 0.0–0.1)
Basophils Relative: 0 %
Eosinophils Absolute: 0.1 10*3/uL (ref 0.0–0.5)
Eosinophils Relative: 1 %
HCT: 30.5 % — ABNORMAL LOW (ref 36.0–46.0)
Hemoglobin: 9 g/dL — ABNORMAL LOW (ref 12.0–15.0)
Immature Granulocytes: 1 %
Lymphocytes Relative: 12 %
Lymphs Abs: 1.7 10*3/uL (ref 0.7–4.0)
MCH: 30.5 pg (ref 26.0–34.0)
MCHC: 29.5 g/dL — ABNORMAL LOW (ref 30.0–36.0)
MCV: 103.4 fL — ABNORMAL HIGH (ref 80.0–100.0)
Monocytes Absolute: 0.7 10*3/uL (ref 0.1–1.0)
Monocytes Relative: 5 %
Neutro Abs: 10.9 10*3/uL — ABNORMAL HIGH (ref 1.7–7.7)
Neutrophils Relative %: 81 %
Platelets: 288 10*3/uL (ref 150–400)
RBC: 2.95 MIL/uL — ABNORMAL LOW (ref 3.87–5.11)
RDW: 14.5 % (ref 11.5–15.5)
WBC: 13.5 10*3/uL — ABNORMAL HIGH (ref 4.0–10.5)
nRBC: 0 % (ref 0.0–0.2)

## 2020-10-31 LAB — COMPREHENSIVE METABOLIC PANEL
ALT: 16 U/L (ref 0–44)
AST: 15 U/L (ref 15–41)
Albumin: 3.3 g/dL — ABNORMAL LOW (ref 3.5–5.0)
Alkaline Phosphatase: 44 U/L (ref 38–126)
Anion gap: 10 (ref 5–15)
BUN: 66 mg/dL — ABNORMAL HIGH (ref 8–23)
CO2: 17 mmol/L — ABNORMAL LOW (ref 22–32)
Calcium: 8.8 mg/dL — ABNORMAL LOW (ref 8.9–10.3)
Chloride: 112 mmol/L — ABNORMAL HIGH (ref 98–111)
Creatinine, Ser: 7.14 mg/dL — ABNORMAL HIGH (ref 0.44–1.00)
GFR, Estimated: 5 mL/min — ABNORMAL LOW (ref >60)
Glucose, Bld: 85 mg/dL (ref 70–99)
Potassium: 5.3 mmol/L — ABNORMAL HIGH (ref 3.5–5.1)
Sodium: 139 mmol/L (ref 135–145)
Total Bilirubin: 0.5 mg/dL (ref 0.3–1.2)
Total Protein: 6.3 g/dL — ABNORMAL LOW (ref 6.5–8.1)

## 2020-10-31 MED ORDER — PREDNISONE 10 MG PO TABS: 20.0000 mg | ORAL_TABLET | Freq: Every day | ORAL | 0 refills | Status: AC

## 2020-10-31 MED ORDER — LIDOCAINE 5 % EX PTCH
1.0000 | MEDICATED_PATCH | Freq: Once | CUTANEOUS | Status: DC
  Administered 2020-10-31: 1 via TRANSDERMAL
  Filled 2020-10-31: qty 1

## 2020-10-31 MED ORDER — PREDNISONE 20 MG PO TABS
40.0000 mg | ORAL_TABLET | Freq: Once | ORAL | Status: AC
  Administered 2020-10-31: 40 mg via ORAL
  Filled 2020-10-31: qty 2

## 2020-10-31 MED ORDER — LIDOCAINE 5 % EX PTCH: 1.0000 | MEDICATED_PATCH | CUTANEOUS | 0 refills | Status: AC

## 2020-10-31 NOTE — Discharge Instructions (Addendum)
Return for any problem.  ?

## 2020-10-31 NOTE — ED Provider Notes (Signed)
[facility] EMERGENCY DEPARTMENT Provider Note   CSN: [id number] Arrival date & time: 10/31/20  1401     History Chief Complaint  Patient presents with   Neck Pain    Katrina Yang is a 80 y.o. female.  80 year old female with prior medical history as detailed below presents for evaluation.  Patient with longstanding history of neck pain.  Patient reports persistent pain to the neck despite injections to this area.  Patient reports recent CT imaging over the weekend without evidence of significant other acute pathology.  Patient with longstanding history of renal dysfunction.  Patient is currently using Tylenol alone for treatment of her pain.  She does not want narcotic pain medicines given her renal disease.  The history is provided by the patient and medical records.  Neck Pain Pain location: Point tenderness overlying the posterior neck at approximately C5-C6. Quality:  Aching Pain radiates to:  Does not radiate Pain severity:  Moderate Pain is:  Same all the time Onset quality:  Gradual Duration:  3 weeks Timing:  Constant Progression:  Waxing and waning Chronicity:  Chronic     Past Medical History:  Diagnosis Date   Anemia    Bifascicular block    Carotid bruit    a.  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   Cervical spondylosis without myelopathy 02/01/2015   CKD (chronic kidney disease), stage IV (H[facility])    Colonic polyp    hx of   CVA (cerebral vascular accident) ([place])    hx of   Degenerative joint disease    Diverticulosis of colon (without mention of hemorrhage)    History of hysterectomy    Hypertension    a. Renal artery dopplers in 2012 with no evidence for significant renal artery stenosis.    Occipital neuralgia of left side 08/30/2013   Osteoporosis, unspecified    Palpitations    a. Event monitor (8/14) with no significant abnormalities.     Peripheral neuropathic pain    Personal history of unspecified urinary disorder     Presence of permanent cardiac pacemaker    Pulmonary nodule    Pure hypercholesterolemia    Secondary hyperparathyroidism (of renal origin)    Unspecified hypothyroidism     Patient Active Problem List   Diagnosis Date Noted   Hyperkalemia 09/20/2019   CKD (chronic kidney disease) stage 5, GFR less than 15 ml/min ([place]) 11/25/2018   Ecchymosis 09/23/2017   Pacemaker 02/24/2017   Syncope, cardiogenic 02/23/2017   Contusion of face    Dental injury    Anemia 02/22/2017   Syncope 02/22/2017   Paronychia of finger 11/03/2016   Carotid artery disease ([place]) 11/05/2015   Skin lesion 11/01/2015   Medication reaction 11/01/2015   Anemia, iron deficiency 08/28/2015   Temporary cerebral vascular dysfunction 03/14/2015   Atrophic kidney 03/14/2015   Frequent UTI 03/14/2015   Dyslipidemia 03/14/2015   Arthritis of knee, degenerative 03/14/2015   Peripheral nerve disease 03/14/2015   Cerebrovascular accident (CVA) ([place]) 03/14/2015   Cervical spondylosis without myelopathy 02/01/2015   Knee pain 06/28/2014   Pain in joint, lower leg 06/28/2014   Nerve root disorder 05/12/2014   Post laminectomy syndrome 05/12/2014   Occipital neuralgia of left side 08/30/2013   Kidney cysts 07/20/2013   Unspecified constipation 05/19/2013   Avitaminosis D 02/01/2013   Hyperparathyroidism due to renal insufficiency ([place]) 02/01/2013   Low back pain 01/19/2013   Numbness 07/21/2012   Pneumonia, organism unspecified([place]) 04/04/2011   Encounter for long-term (  current) use of other medications 01/03/2011   Sinusitis, chronic 10/01/2010   DYSPHAGIA UNSPECIFIED 05/16/2010   POLYURIA 05/16/2010   Cough 03/18/2010   CELLULITIS AND ABSCESS OF FACE 02/19/2009   ESOPHAGEAL MOTILITY DISORDER 12/04/2008   Elevated cholesterol 08/17/2008   Cerebral artery occlusion with cerebral infarction ([place]) 08/17/2008   History of cardiovascular disorder 08/17/2008   PULMONARY NODULE 04/27/2007   Hypothyroidism 02/06/2007    Essential hypertension 02/06/2007   GERD 02/06/2007   Diverticulosis of colon 02/06/2007   Osteoarthritis of spine 02/06/2007   Osteoporosis 02/06/2007   Palpitations 02/06/2007   COLONIC POLYPS, HX OF 02/06/2007    Past Surgical History:  Procedure Laterality Date   ABDOMINAL HYSTERECTOMY     BACK SURGERY     diskectomy L-5. Multiple back surgeries   BASCILIC VEIN TRANSPOSITION Right 12/29/2017   Procedure: FIRST STAGE BASILIC VEIN TRANSPOSITION RIGHT ARM;  Surgeon: Angelia Mould, MD;  Location: [place];  Service: Vascular;  Laterality: Right;   [place] Right 04/13/2018   Procedure: BASILIC VEIN TRANSPOSITION SECOND STAGE;  Surgeon: Angelia Mould, MD;  Location: [place];  Service: Vascular;  Laterality: Right;   MENISCECTOMY Left 11/14/15   PACEMAKER IMPLANT N/A 02/23/2017   Procedure: PACEMAKER IMPLANT;  Surgeon: Evans Lance, MD;  Location: [facility];  Service: Cardiovascular;  Laterality: N/A;     OB History   No obstetric history on file.     Family History  Problem Relation Age of Onset   Kidney cancer Father    Heart attack Father    Heart disease Father    Colon cancer Mother    Ovarian cancer Mother    Heart disease Mother    Heart failure Mother    Stomach cancer Neg Hx     Social History   Tobacco Use   Smoking status: Never   Smokeless tobacco: Never  Vaping Use   Vaping Use: Never used  Substance Use Topics   Alcohol use: No   Drug use: No    Home Medications Prior to Admission medications   Medication Sig Start Date End Date Taking? Authorizing Provider  lidocaine (LIDODERM) 5 % Place 1 patch onto the skin daily. Remove & Discard patch within 12 hours or as directed by MD 10/31/20  Yes Valarie Merino, MD  predniSONE (DELTASONE) 10 MG tablet Take 2 tablets (20 mg total) by mouth daily for 5 days. 10/31/20 11/05/20 Yes Valarie Merino, MD  amLODipine (NORVASC) 5 MG tablet TAKE 1 TABLET BY MOUTH TWICE A  DAY 09/09/20   Libby Maw, MD  calcitRIOL (ROCALTROL) 0.25 MCG capsule TAKE 1 CAPSULE BY MOUTH EVERY DAY 10/05/20   Libby Maw, MD  clopidogrel (PLAVIX) 75 MG tablet Take 1 tablet (75 mg total) by mouth daily. 10/24/19   Suzzanne Cloud, NP  gabapentin (NEURONTIN) 100 MG capsule Take 100 mg by mouth daily.    [provider]  levothyroxine (SYNTHROID) 100 MCG tablet TAKE 1 TABLET BY MOUTH EVERY DAY 09/09/20   Libby Maw, MD  lisinopril (ZESTRIL) 2.5 MG tablet Take 1 tablet by mouth daily. 06/16/14   [provider]  metoprolol tartrate (LOPRESSOR) 25 MG tablet TAKE 1 TABLET BY MOUTH TWICE A DAY 10/05/20   Libby Maw, MD  predniSONE (DELTASONE) 5 MG tablet prednisone 5 mg tablet    [provider]  rosuvastatin (CRESTOR) 10 MG tablet TAKE 1/2 TABLET BY MOUTH EVERY DAY 10/20/20   Ethelene Hal,  Mortimer Fries, MD    Allergies    Iodinated diagnostic agents, Latex, Codeine, Lovastatin, Metronidazole, and Sulfa antibiotics  Review of Systems   Review of Systems  Musculoskeletal:  Positive for neck pain.  All other systems reviewed and are negative.  Physical Exam Updated Vital Signs BP (!) 175/68   Pulse 79   Temp 98.1 F (36.7 C) (Oral)   Resp 19   SpO2 99%   Physical Exam Vitals and nursing note reviewed.  Constitutional:      General: She is not in acute distress.    Appearance: Normal appearance. She is well-developed.  HENT:     Head: Normocephalic and atraumatic.  Eyes:     Conjunctiva/sclera: Conjunctivae normal.     Pupils: Pupils are equal, round, and reactive to light.  Neck:     Comments: Tenderness at c5/c6 Cardiovascular:     Rate and Rhythm: Normal rate and regular rhythm.     Heart sounds: Normal heart sounds.  Pulmonary:     Effort: Pulmonary effort is normal. No respiratory distress.     Breath sounds: Normal breath sounds.  Abdominal:     General: There is no distension.     Palpations: Abdomen is  soft.     Tenderness: There is no abdominal tenderness.  Musculoskeletal:        General: No deformity. Normal range of motion.     Cervical back: Normal range of motion and neck supple. Tenderness present.  Skin:    General: Skin is warm and dry.  Neurological:     General: No focal deficit present.     Mental Status: She is alert and oriented to person, place, and time.    ED Results / Procedures / Treatments   Labs (all labs ordered are listed, but only abnormal results are displayed) Labs Reviewed  CBC WITH DIFFERENTIAL/PLATELET - Abnormal; Notable for the following components:      Result Value   WBC 13.5 (*)    RBC 2.95 (*)    Hemoglobin 9.0 (*)    HCT 30.5 (*)    MCV 103.4 (*)    MCHC 29.5 (*)    Neutro Abs 10.9 (*)    Abs Immature Granulocytes 0.10 (*)    All other components within normal limits  COMPREHENSIVE METABOLIC PANEL - Abnormal; Notable for the following components:   Potassium 5.3 (*)    Chloride 112 (*)    CO2 17 (*)    BUN 66 (*)    Creatinine, Ser 7.14 (*)    Calcium 8.8 (*)    Total Protein 6.3 (*)    Albumin 3.3 (*)    GFR, Estimated 5 (*)    All other components within normal limits    EKG EKG Interpretation  Date/Time:  Wednesday October 31 2020 16:41:24 EDT Ventricular Rate:  80 PR Interval:  142 QRS Duration: 148 QT Interval:  406 QTC Calculation: 468 R Axis:   98 Text Interpretation: Normal sinus rhythm Right bundle branch block Abnormal ECG Confirmed by Dene Gentry [phone]) on 10/31/2020 9:51:00 PM  Radiology DG Chest 2 View  Result Date: 10/31/2020 CLINICAL DATA:  80 year old female with left neck pain. EXAM: CHEST - 2 VIEW COMPARISON:  Chest radiograph dated 08/31/2017. FINDINGS: Background of emphysema and chronic interstitial coarsening and bronchitic changes. Left lung base density may represent atelectasis/scarring. Developing infiltrate is not excluded. A small left pleural effusion may be present. No pneumothorax. Mild  cardiomegaly. Atherosclerotic calcification of the aorta. Left pectoral  pacemaker device. Osteopenia with degenerative changes of the spine and shoulders. No acute osseous pathology. IMPRESSION: 1. Left lung base atelectasis/scarring. Developing infiltrate is not excluded. 2. Emphysema. Electronically Signed   By: Anner Crete M.D.   On: 10/31/2020 17:15    Procedures Procedures   Medications Ordered in ED Medications  lidocaine (LIDODERM) 5 % 1 patch (1 patch Transdermal Patch Applied 10/31/20 2117)  predniSONE (DELTASONE) tablet 40 mg (40 mg Oral Given 10/31/20 2117)    ED Course  I have reviewed the triage vital signs and the nursing notes.  Pertinent labs & imaging results that were available during my care of the patient were reviewed by me and considered in my medical decision making (see chart for details).    MDM Rules/Calculators/A&P                          MDM  MSE complete  Katrina Yang was evaluated in Emergency Department on 10/31/2020 for the symptoms described in the history of present illness. She was evaluated in the context of the global COVID-19 pandemic, which necessitated consideration that the patient might be at risk for infection with the SARS-CoV-2 virus that causes COVID-19. Institutional protocols and algorithms that pertain to the evaluation of patients at risk for COVID-19 are in a state of rapid change based on information released by regulatory bodies including the CDC and federal and state organizations. These policies and algorithms were followed during the patient's care in the ED.  Patient with chronic neck pain.  Patient reports that her pain has not responded well to localized injections.  She reports CT imaging within the last week without evidence of fracture or other acute pathology.  Additional work-up in the ED is not indicated.  Patient is not interested in narcotic pain medications given her underlying renal disease.  She is willing to  try Lidoderm patch and p.o. steroids.  P.o. steroids in the past have improved her symptoms.  After administration of steroids and Lidoderm patch the patient does feel improved.  She now desires discharge home.  She does understand need for close follow-up with her regular care providers in the outpatient setting.   Final Clinical Impression(s) / ED Diagnoses Final diagnoses:  Neck pain    Rx / DC Orders ED Discharge Orders          Ordered    lidocaine (LIDODERM) 5 %  Every 24 hours        10/31/20 2233    predniSONE (DELTASONE) 10 MG tablet  Daily        10/31/20 2233             Valarie Merino, MD 10/31/20 2303

## 2020-10-31 NOTE — ED Triage Notes (Signed)
Pt presents with posterior neck pain x3 weeks, seen orthopedic and received 3 injections w.no relief. Recent CT scan did not show anything acute per her ortho

## 2020-10-31 NOTE — ED Provider Notes (Signed)
Emergency Medicine Provider Triage Evaluation Note  Katrina Yang , a 80 y.o. female  was evaluated in triage.  Pt complains of posterior neck pain for 2 to 3 weeks.  She had a recent CT scan that per her report did not show a cause of her pain.  She denies any trauma.  No chiropractic adjustments or manipulations.  The pain is in her left posterior shoulder up into her neck.  She denies any headache, weakness numbness or tingling.  She has been seen by orthopedics and had 3 cortisone shots per her report without significant improvement..  Review of Systems  Positive: Pain in left sided neck Negative: Weakness, N/V, CP  Physical Exam  BP 126/68 (BP Location: Left Arm)   Pulse 75   Temp 98.6 F (37 C) (Oral)   Resp 17   SpO2 100%  Gen:   Awake, appears uncomfortable Resp:  Normal effort  MSK:   Moves extremities without difficulty  Other:  TTP in left posterior shoulder .   Medical Decision Making  Medically screening exam initiated at 4:37 PM.  Appropriate orders placed.  [name] was informed that the remainder of the evaluation will be completed by another provider, this initial triage assessment does not replace that evaluation, and the importance of remaining in the ED until their evaluation is complete.  Note: Portions of this report may have been transcribed using voice recognition software. Every effort was made to ensure accuracy; however, inadvertent computerized transcription errors may be present    Lorin Glass, PA-C 10/31/20 [place], [place], DO 10/31/20 1907

## 2020-11-01 ENCOUNTER — Inpatient Hospital Stay (HOSPITAL_COMMUNITY): Admission: RE | Admit: 2020-11-01 | Payer: Medicare PPO | Source: Ambulatory Visit

## 2020-11-02 ENCOUNTER — Telehealth: Payer: Self-pay

## 2020-11-02 ENCOUNTER — Telehealth: Payer: Medicare PPO | Admitting: Family Medicine

## 2020-11-02 NOTE — Telephone Encounter (Signed)
Will you cancel Dr. Bebe Shaggy 11am? When I called she said she just got out of the hospital and did not want to do the visit patient states that she will call us when she's feeling better.

## 2020-11-08 DIAGNOSIS — M542 Cervicalgia: Secondary | ICD-10-CM | POA: Diagnosis not present

## 2020-11-19 ENCOUNTER — Ambulatory Visit: Payer: Medicare PPO | Admitting: Neurology

## 2020-11-19 ENCOUNTER — Other Ambulatory Visit: Payer: Self-pay

## 2020-11-19 ENCOUNTER — Encounter: Payer: Self-pay | Admitting: Neurology

## 2020-11-19 VITALS — BP 113/58 | HR 84 | Ht 63.0 in | Wt 98.5 lb

## 2020-11-19 DIAGNOSIS — G8929 Other chronic pain: Secondary | ICD-10-CM

## 2020-11-19 DIAGNOSIS — M5481 Occipital neuralgia: Secondary | ICD-10-CM

## 2020-11-19 DIAGNOSIS — M25562 Pain in left knee: Secondary | ICD-10-CM

## 2020-11-19 MED ORDER — GABAPENTIN 100 MG PO CAPS: 100.0000 mg | ORAL_CAPSULE | Freq: Every day | ORAL | 3 refills | Status: AC

## 2020-11-19 NOTE — Progress Notes (Signed)
I have read the note, and I agree with the clinical assessment and plan.  Katrina Yang Katrina Yang Katrina Yang   

## 2020-11-19 NOTE — Progress Notes (Signed)
PATIENT: Katrina Yang DOB: October 21, 1940  REASON FOR VISIT: follow up HISTORY FROM: patient Primary Neurologist: Dr. Jannifer Franklin   HISTORY OF PRESENT ILLNESS: Today 11/19/20  Katrina Yang is a 80 year old female with history of left occipital neuralgia. 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 Remains on gabapentin 100 mg daily, for the left knee pain. Several weeks ago had episode neck pain, couldn't lift the head off the bed. Can't have MRI, saw orthopedics, started prednisone, wearing lidocaine patch, is gradually getting better. Dr. Gladstone Lighter is orthopedic doctor, was doing steroid injections prior to this. Her whole left side is the problem side. Lives alone, her kids check on her, no falls, using cane. Here with her son, Jeani Hawking.   Update 09/13/2019 SS: Katrina Yang is a 80 year old female with history of left occipital neuralgia.  She has history of chronic renal insufficiency, is not yet on hemodialysis, but has a fistula. She is limited on her dose of gabapentin due to her renal issues.  She is currently prescribed gabapentin 100 mg twice a day, but usually only takes 100 mg in the evening.  She finds this is beneficial for the occipital neuralgia, along with pain in her left knee, and foot.  She has a bad left knee, indicates she needs a knee replacement, seeing orthopedics.  Has a cane, but is in the car today.  If she misses the gabapentin, can see a difference in her neck pain.  Her husband passed away in 2023/05/06.  No new problems or concerns.  She presents today for evaluation unaccompanied.  HISTORY 03/18/2018 Dr. Jannifer Franklin: Katrina Yang is a 80 year old right-handed white female with a history of chronic renal insufficiency.  The patient is not yet on hemodialysis but she does have a permanent access placed.  The patient is still having some issues with the left occipital neuralgia but she is not taking the prescribed dose of her gabapentin.  She is only taking 100 mg daily, she will take 200 mg day  if she is having increased discomfort.  The patient indicates that the pain is oftentimes is worse in the evening hours.  She returns this office for an evaluation.  She has not had any problems with falls, she is usually sleeping fairly well at night.   REVIEW OF SYSTEMS: Out of a complete 14 system review of symptoms, the patient complains only of the following symptoms, and all other reviewed systems are negative.  Knee pain, left.  ALLERGIES: Allergies  Allergen Reactions   Iodinated Diagnostic Agents Anaphylaxis, Swelling and Other (See Comments)    Throat swelling   Latex Rash   Codeine Nausea And Vomiting and Rash   Lovastatin Nausea Only   Metronidazole Nausea And Vomiting   Sulfa Antibiotics Nausea And Vomiting and Rash    HOME MEDICATIONS: Outpatient Medications Prior to Visit  Medication Sig Dispense Refill   amLODipine (NORVASC) 5 MG tablet TAKE 1 TABLET BY MOUTH TWICE A DAY 180 tablet 0   calcitRIOL (ROCALTROL) 0.25 MCG capsule TAKE 1 CAPSULE BY MOUTH EVERY DAY 90 capsule 0   clopidogrel (PLAVIX) 75 MG tablet Take 1 tablet (75 mg total) by mouth daily. 90 tablet 3   levothyroxine (SYNTHROID) 100 MCG tablet TAKE 1 TABLET BY MOUTH EVERY DAY 90 tablet 0   lidocaine (LIDODERM) 5 % Place 1 patch onto the skin daily. Remove & Discard patch within 12 hours or as directed by MD 30 patch 0   lisinopril (ZESTRIL) 2.5 MG tablet Take 1  tablet by mouth daily.     metoprolol tartrate (LOPRESSOR) 25 MG tablet TAKE 1 TABLET BY MOUTH TWICE A DAY 180 tablet 0   predniSONE (DELTASONE) 5 MG tablet prednisone 5 mg tablet     rosuvastatin (CRESTOR) 10 MG tablet TAKE 1/2 TABLET BY MOUTH EVERY DAY 45 tablet 1   gabapentin (NEURONTIN) 100 MG capsule Take 100 mg by mouth daily.     Facility-Administered Medications Prior to Visit  Medication Dose Route Frequency Provider Last Rate Last Admin   0.9 %  sodium chloride infusion  500 mL Intravenous Once Armbruster, Carlota Raspberry, MD        PAST  MEDICAL HISTORY: Past Medical History:  Diagnosis Date   Anemia    Bifascicular block    Carotid bruit    a.  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   Cervical spondylosis without myelopathy 02/01/2015   CKD (chronic kidney disease), stage IV (HCC)    Colonic polyp    hx of   CVA (cerebral vascular accident) ([place])    hx of   Degenerative joint disease    Diverticulosis of colon (without mention of hemorrhage)    History of hysterectomy    Hypertension    a. Renal artery dopplers in 2012 with no evidence for significant renal artery stenosis.    Occipital neuralgia of left side 08/30/2013   Osteoporosis, unspecified    Palpitations    a. Event monitor (8/14) with no significant abnormalities.     Peripheral neuropathic pain    Personal history of unspecified urinary disorder    Presence of permanent cardiac pacemaker    Pulmonary nodule    Pure hypercholesterolemia    Secondary hyperparathyroidism (of renal origin)    Unspecified hypothyroidism     PAST SURGICAL HISTORY: Past Surgical History:  Procedure Laterality Date   ABDOMINAL HYSTERECTOMY     BACK SURGERY     diskectomy L-5. Multiple back surgeries   BASCILIC VEIN TRANSPOSITION Right 12/29/2017   Procedure: FIRST STAGE BASILIC VEIN TRANSPOSITION RIGHT ARM;  Surgeon: Angelia Mould, MD;  Location: [facility];  Service: Vascular;  Laterality: Right;   [place] Right 04/13/2018   Procedure: BASILIC VEIN TRANSPOSITION SECOND STAGE;  Surgeon: Angelia Mould, MD;  Location: [facility];  Service: Vascular;  Laterality: Right;   MENISCECTOMY Left 11/14/15   PACEMAKER IMPLANT N/A 02/23/2017   Procedure: PACEMAKER IMPLANT;  Surgeon: Evans Lance, MD;  Location: [facility];  Service: Cardiovascular;  Laterality: N/A;    FAMILY HISTORY: Family History  Problem Relation Age of Onset   Kidney cancer Father    Heart attack Father    Heart disease Father    Colon cancer Mother    Ovarian cancer  Mother    Heart disease Mother    Heart failure Mother    Stomach cancer Neg Hx     SOCIAL HISTORY: Social History   Socioeconomic History   Marital status: Widowed    Spouse name: [name]   Number of children: 1   Years of education: 2   Highest education level: Not on file  Occupational History   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   Patient is right handed.   Social Determinants of Health   Financial Resource Strain: Low Risk    Difficulty of Paying Living Expenses: Not hard at all  Food Insecurity: No Food Insecurity   Worried About Charity fundraiser in the Last Year: Never true   [place] in the Last Year: Never true  Transportation Needs: No Transportation Needs   Lack of Transportation (Medical): No   Lack of Transportation (Non-Medical): No  Physical Activity: Inactive   Days of Exercise per Week: 0 days   Minutes of Exercise per Session: 0 min  Stress: No Stress Concern Present   Feeling of Stress : Not at all  Social Connections: Socially Isolated   Frequency of Communication with Friends and Family: More than three times a week   Frequency of Social Gatherings with Friends and Family: More than three times a week   Attends Religious Services: Never   Marine scientist or Organizations: No   Attends Archivist Meetings: Never   Marital Status: Widowed  Human resources officer Violence: Not At Risk   Fear of Current or Ex-Partner: No   Emotionally Abused: No   Physically Abused: No   Sexually Abused: No   PHYSICAL EXAM  Vitals:   11/19/20 1025  BP: (!)  113/58  Pulse: 84  Weight: 98 lb 8 oz (44.7 kg)  Height: 5\' 3"  (1.6 m)    Body mass index is 17.45 kg/m.  Generalized: Well developed, in no acute distress   Neurological examination  Mentation: Alert oriented to time, place, history taking. Follows all commands speech and language fluent Cranial nerve II-XII: Pupils were equal round reactive to light. Extraocular movements were full, visual field were full on confrontational test. Facial sensation and strength were normal.  Head turning and shoulder shrug  were normal and symmetric. Motor: 5/5 strength of all extremities, left 4/5 hip flexion and knee extension Sensory: Sensory testing is intact to soft touch on all 4 extremities. No evidence of extinction is noted.  Coordination: Cerebellar testing reveals good finger-nose-finger and heel-to-shin bilaterally.  Gait and station: Gait is slightly wide-based, cautious, using single point cane Reflexes: Deep tendon reflexes are symmetric  DIAGNOSTIC DATA (LABS, IMAGING, TESTING) - I reviewed patient records, labs, notes, testing and imaging myself where available.  Lab Results  Component Value Date   WBC 13.5 (H) 10/31/2020   HGB 9.0 (L) 10/31/2020   HCT 30.5 (L) 10/31/2020   MCV 103.4 (H) 10/31/2020   PLT 288 10/31/2020      Component Value Date/Time   NA 139 10/31/2020 1640   NA 138 11/25/2018 1425   K 5.3 (H) 10/31/2020 1640   CL 112 (H) 10/31/2020 1640   CO2 17 (L) 10/31/2020 1640   GLUCOSE 85 10/31/2020 1640   BUN 66 (H) 10/31/2020 1640   BUN 47 (H) 11/25/2018 1425   CREATININE 7.14 (H) 10/31/2020 1640   CALCIUM 8.8 (L) 10/31/2020 1640   CALCIUM 9.4 01/14/2012 0910   PROT 6.3 (L) 10/31/2020 1640   ALBUMIN 3.3 (L) 10/31/2020 1640   AST 15 10/31/2020 1640   ALT 16 10/31/2020 1640   ALKPHOS 44 10/31/2020 1640   BILITOT 0.5 10/31/2020 1640   GFRNONAA 5 (L) 10/31/2020 1640   GFRAA 8 (L) 11/25/2018 1425  Lab Results  Component Value Date   CHOL 183 09/19/2019    HDL 79.40 09/19/2019   LDLCALC 88 09/19/2019   LDLDIRECT 107.3 02/14/2013   TRIG 82.0 09/19/2019   CHOLHDL 2 09/19/2019   No results found for: HGBA1C No results found for: VITAMINB12 Lab Results  Component Value Date   TSH 5.50 (H) 09/19/2019      ASSESSMENT AND PLAN 80 y.o. year old female  has a past medical history of Anemia, Bifascicular block, Carotid bruit, Cervical spondylosis without myelopathy (02/01/2015), CKD (chronic kidney disease), stage IV ([place]), Colonic polyp, CVA (cerebral vascular accident) ([place]), Degenerative joint disease, Diverticulosis of colon (without mention of hemorrhage), History of hysterectomy, Hypertension, Occipital neuralgia of left side (08/30/2013), Osteoporosis, unspecified, Palpitations, Peripheral neuropathic pain, Personal history of unspecified urinary disorder, Presence of permanent cardiac pacemaker, Pulmonary nodule, Pure hypercholesterolemia, Secondary hyperparathyroidism (of renal origin), and Unspecified hypothyroidism. here with:  1.  Left occipital neuralgia 2.  Left knee pain  -Refill sent in for gabapentin 100 mg daily, remains on low-dose, due to CKD, hemodialysis has been recommended, but she has deferred for now -Seeing orthopedics for neck issues, injections -Will continue follow-up with PCP for further refills, return to our office on an as-needed basis or any new issues  Butler Denmark, AGNP-C, DNP 11/19/2020, 11:03 AM [facility] [address] [place], [place] [address] [phone]

## 2020-11-19 NOTE — Patient Instructions (Signed)
Continue gabapentin See you primary doctor for future refills  Come back if any new issues

## 2020-11-21 ENCOUNTER — Telehealth: Payer: Self-pay | Admitting: Family Medicine

## 2020-11-21 ENCOUNTER — Telehealth: Payer: Self-pay | Admitting: Cardiovascular Disease

## 2020-11-21 NOTE — Telephone Encounter (Signed)
Ebony Hail from Hospice states the patient's family is requesting hospice evaluation and would like to know if Dr. Burt Knack will give orders to evaluate. Phone: [phone]

## 2020-11-21 NOTE — Telephone Encounter (Signed)
Spoke with Ebony Hail from [facility] and she is going to reach out to the patient's PCP to see if they will give orders. If they are not I advised her to call back and we will check with Dr. Burt Knack however usually we defer hospice orders to PCP.

## 2020-11-21 NOTE — Telephone Encounter (Signed)
Ebony Hail from [facility] called stating the pts family is requesting hospice evaluation. Requesting Verbal order from Dr. Ethelene Hal   Phone: [phone]

## 2020-11-21 NOTE — Telephone Encounter (Signed)
Please advise message below. Okay for verbal orders for hospice per family's request?

## 2020-11-22 NOTE — Telephone Encounter (Signed)
Verbal order given to [facility].

## 2020-11-23 ENCOUNTER — Encounter (HOSPITAL_COMMUNITY): Payer: Medicare PPO

## 2020-11-27 LAB — CUP PACEART REMOTE DEVICE CHECK
Battery Remaining Longevity: 90 mo
Battery Remaining Percentage: 71 %
Battery Voltage: 3.01 V
Brady Statistic AP VP Percent: 1 %
Brady Statistic AP VS Percent: 6.7 %
Brady Statistic AS VP Percent: 6.7 %
Brady Statistic AS VS Percent: 86 %
Brady Statistic RA Percent Paced: 6.3 %
Brady Statistic RV Percent Paced: 6.9 %
Date Time Interrogation Session: 20220726082153
Implantable Lead Implant Date: 20181022
Implantable Lead Implant Date: 20181022
Implantable Lead Location: 753859
Implantable Lead Location: 753860
Implantable Pulse Generator Implant Date: 20181022
Lead Channel Impedance Value: 380 Ohm
Lead Channel Impedance Value: 480 Ohm
Lead Channel Pacing Threshold Amplitude: 0.5 V
Lead Channel Pacing Threshold Amplitude: 0.625 V
Lead Channel Pacing Threshold Pulse Width: 0.5 ms
Lead Channel Pacing Threshold Pulse Width: 0.5 ms
Lead Channel Sensing Intrinsic Amplitude: 12 mV
Lead Channel Sensing Intrinsic Amplitude: 4.3 mV
Lead Channel Setting Pacing Amplitude: 0.875
Lead Channel Setting Pacing Amplitude: 2 V
Lead Channel Setting Pacing Pulse Width: 0.5 ms
Lead Channel Setting Sensing Sensitivity: 2 mV
Pulse Gen Model: 2272
Pulse Gen Serial Number: 8958692

## 2020-11-29 ENCOUNTER — Encounter (HOSPITAL_COMMUNITY): Payer: Medicare PPO

## 2020-11-30 ENCOUNTER — Telehealth: Payer: Self-pay

## 2020-11-30 NOTE — Telephone Encounter (Signed)
Called patient to arrange 1 year echo and office visit with Dr. Burt Knack. Her daughter answered the phone and stated the patient passed away peacefully in her sleep last night.  Will route to Dr. Burt Knack as Juluis Rainier.

## 2020-12-03 DEATH — deceased

## 2020-12-06 ENCOUNTER — Encounter: Payer: Self-pay | Admitting: Family Medicine

## 2020-12-21 ENCOUNTER — Encounter (HOSPITAL_COMMUNITY): Payer: Medicare PPO

## 2021-03-19 ENCOUNTER — Ambulatory Visit: Payer: Medicare PPO

## 2021-07-22 ENCOUNTER — Ambulatory Visit: Payer: Medicare PPO | Admitting: Cardiovascular Disease

## 2021-07-22 ENCOUNTER — Other Ambulatory Visit (HOSPITAL_COMMUNITY): Payer: Medicare PPO
